# Patient Record
Sex: Male | Born: 1940 | Race: White | Hispanic: No | Smoking: Current every day smoker
Health system: Southern US, Community
[De-identification: ages and names within clinical notes are randomized; demographics above are authoritative.]

## PROBLEM LIST (undated history)

## (undated) DIAGNOSIS — T7840XA Allergy, unspecified, initial encounter: Secondary | ICD-10-CM

## (undated) DIAGNOSIS — F32A Depression, unspecified: Secondary | ICD-10-CM

## (undated) DIAGNOSIS — K219 Gastro-esophageal reflux disease without esophagitis: Secondary | ICD-10-CM

## (undated) DIAGNOSIS — E785 Hyperlipidemia, unspecified: Secondary | ICD-10-CM

## (undated) DIAGNOSIS — N529 Male erectile dysfunction, unspecified: Secondary | ICD-10-CM

## (undated) DIAGNOSIS — M545 Low back pain, unspecified: Secondary | ICD-10-CM

## (undated) DIAGNOSIS — E119 Type 2 diabetes mellitus without complications: Secondary | ICD-10-CM

## (undated) DIAGNOSIS — F329 Major depressive disorder, single episode, unspecified: Secondary | ICD-10-CM

## (undated) DIAGNOSIS — G4700 Insomnia, unspecified: Secondary | ICD-10-CM

## (undated) DIAGNOSIS — I10 Essential (primary) hypertension: Secondary | ICD-10-CM

## (undated) DIAGNOSIS — M1990 Unspecified osteoarthritis, unspecified site: Secondary | ICD-10-CM

## (undated) DIAGNOSIS — G8929 Other chronic pain: Secondary | ICD-10-CM

## (undated) DIAGNOSIS — F17200 Nicotine dependence, unspecified, uncomplicated: Secondary | ICD-10-CM

## (undated) DIAGNOSIS — J449 Chronic obstructive pulmonary disease, unspecified: Secondary | ICD-10-CM

## (undated) DIAGNOSIS — I739 Peripheral vascular disease, unspecified: Secondary | ICD-10-CM

## (undated) DIAGNOSIS — I701 Atherosclerosis of renal artery: Secondary | ICD-10-CM

## (undated) DIAGNOSIS — F419 Anxiety disorder, unspecified: Secondary | ICD-10-CM

## (undated) HISTORY — DX: Low back pain: M54.5

## (undated) HISTORY — DX: Anxiety disorder, unspecified: F41.9

## (undated) HISTORY — PX: COLONOSCOPY: SHX174

## (undated) HISTORY — DX: Essential (primary) hypertension: I10

## (undated) HISTORY — DX: Atherosclerosis of renal artery: I70.1

## (undated) HISTORY — DX: Peripheral vascular disease, unspecified: I73.9

## (undated) HISTORY — DX: Major depressive disorder, single episode, unspecified: F32.9

## (undated) HISTORY — DX: Low back pain, unspecified: M54.50

## (undated) HISTORY — DX: Depression, unspecified: F32.A

## (undated) HISTORY — DX: Insomnia, unspecified: G47.00

## (undated) HISTORY — DX: Other chronic pain: G89.29

## (undated) HISTORY — DX: Male erectile dysfunction, unspecified: N52.9

## (undated) HISTORY — DX: Allergy, unspecified, initial encounter: T78.40XA

## (undated) HISTORY — DX: Chronic obstructive pulmonary disease, unspecified: J44.9

## (undated) HISTORY — DX: Hyperlipidemia, unspecified: E78.5

## (undated) HISTORY — DX: Nicotine dependence, unspecified, uncomplicated: F17.200

---

## 2003-12-19 ENCOUNTER — Ambulatory Visit: Payer: Self-pay | Admitting: Family Medicine

## 2003-12-30 ENCOUNTER — Ambulatory Visit: Payer: Self-pay | Admitting: Family Medicine

## 2004-03-02 ENCOUNTER — Ambulatory Visit: Payer: Self-pay | Admitting: Family Medicine

## 2004-04-06 ENCOUNTER — Ambulatory Visit: Payer: Self-pay | Admitting: Family Medicine

## 2004-05-25 ENCOUNTER — Ambulatory Visit: Payer: Self-pay | Admitting: Family Medicine

## 2004-07-01 ENCOUNTER — Ambulatory Visit (HOSPITAL_COMMUNITY): Admission: RE | Admit: 2004-07-01 | Discharge: 2004-07-01 | Payer: Self-pay | Admitting: Family Medicine

## 2004-07-05 ENCOUNTER — Ambulatory Visit: Payer: Self-pay | Admitting: Family Medicine

## 2004-08-27 ENCOUNTER — Ambulatory Visit: Payer: Self-pay | Admitting: Family Medicine

## 2004-10-08 ENCOUNTER — Ambulatory Visit: Payer: Self-pay | Admitting: Family Medicine

## 2004-11-30 ENCOUNTER — Ambulatory Visit: Payer: Self-pay | Admitting: Family Medicine

## 2005-02-21 ENCOUNTER — Ambulatory Visit (HOSPITAL_COMMUNITY): Admission: RE | Admit: 2005-02-21 | Discharge: 2005-02-21 | Payer: Self-pay | Admitting: Family Medicine

## 2005-02-22 ENCOUNTER — Ambulatory Visit: Payer: Self-pay | Admitting: Family Medicine

## 2005-06-22 ENCOUNTER — Ambulatory Visit: Payer: Self-pay | Admitting: Family Medicine

## 2005-06-24 ENCOUNTER — Ambulatory Visit (HOSPITAL_COMMUNITY): Admission: RE | Admit: 2005-06-24 | Discharge: 2005-06-24 | Payer: Self-pay | Admitting: Family Medicine

## 2005-10-13 ENCOUNTER — Ambulatory Visit: Payer: Self-pay | Admitting: Family Medicine

## 2005-10-21 ENCOUNTER — Ambulatory Visit: Payer: Self-pay | Admitting: Family Medicine

## 2005-12-05 ENCOUNTER — Ambulatory Visit: Payer: Self-pay | Admitting: Family Medicine

## 2006-03-06 ENCOUNTER — Ambulatory Visit: Payer: Self-pay | Admitting: Family Medicine

## 2006-03-06 ENCOUNTER — Ambulatory Visit (HOSPITAL_COMMUNITY): Admission: RE | Admit: 2006-03-06 | Discharge: 2006-03-06 | Payer: Self-pay | Admitting: Family Medicine

## 2006-05-29 ENCOUNTER — Inpatient Hospital Stay (HOSPITAL_COMMUNITY): Admission: EM | Admit: 2006-05-29 | Discharge: 2006-05-31 | Payer: Self-pay | Admitting: Emergency Medicine

## 2006-05-30 ENCOUNTER — Ambulatory Visit: Payer: Self-pay | Admitting: Cardiology

## 2006-06-19 ENCOUNTER — Ambulatory Visit: Payer: Self-pay | Admitting: Family Medicine

## 2006-06-20 ENCOUNTER — Encounter: Payer: Self-pay | Admitting: Family Medicine

## 2006-07-26 ENCOUNTER — Ambulatory Visit: Payer: Self-pay | Admitting: Family Medicine

## 2006-10-04 ENCOUNTER — Ambulatory Visit: Payer: Self-pay | Admitting: Family Medicine

## 2006-10-05 ENCOUNTER — Encounter: Payer: Self-pay | Admitting: Family Medicine

## 2006-10-05 LAB — CONVERTED CEMR LAB
ALT: 17 units/L (ref 0–53)
BUN: 18 mg/dL (ref 6–23)
Bilirubin, Direct: 0.1 mg/dL (ref 0.0–0.3)
CO2: 22 meq/L (ref 19–32)
Chloride: 96 meq/L (ref 96–112)
Indirect Bilirubin: 0.3 mg/dL (ref 0.0–0.9)
Total Bilirubin: 0.4 mg/dL (ref 0.3–1.2)
Total CHOL/HDL Ratio: 2.6

## 2006-11-17 ENCOUNTER — Ambulatory Visit: Payer: Self-pay | Admitting: Family Medicine

## 2007-01-27 ENCOUNTER — Encounter (INDEPENDENT_AMBULATORY_CARE_PROVIDER_SITE_OTHER): Payer: Self-pay | Admitting: *Deleted

## 2007-03-07 ENCOUNTER — Encounter (INDEPENDENT_AMBULATORY_CARE_PROVIDER_SITE_OTHER): Payer: Self-pay | Admitting: *Deleted

## 2007-03-07 ENCOUNTER — Encounter: Payer: Self-pay | Admitting: Family Medicine

## 2007-03-07 LAB — CONVERTED CEMR LAB
Albumin: 4.4 g/dL (ref 3.5–5.2)
CO2: 22 meq/L (ref 19–32)
Chloride: 99 meq/L (ref 96–112)
Creatinine, Ser: 1.03 mg/dL (ref 0.40–1.50)
Glucose, Bld: 206 mg/dL — ABNORMAL HIGH (ref 70–99)
HDL: 41 mg/dL (ref >39)
Indirect Bilirubin: 0.3 mg/dL (ref 0.0–0.9)
Microalb, Ur: 2.59 mg/dL — ABNORMAL HIGH (ref 0.00–1.89)
PSA: 0.47 ng/mL
PSA: 0.47 ng/mL (ref 0.10–4.00)
PSA: NORMAL ng/mL
Sodium: 132 meq/L — ABNORMAL LOW (ref 135–145)
Total Bilirubin: 0.4 mg/dL (ref 0.3–1.2)
Total CHOL/HDL Ratio: 2.6
VLDL: 21 mg/dL (ref 0–40)

## 2007-03-12 ENCOUNTER — Ambulatory Visit: Payer: Self-pay | Admitting: Family Medicine

## 2007-03-12 LAB — CONVERTED CEMR LAB: Hgb A1c MFr Bld: 6.9 % — ABNORMAL HIGH (ref 4.6–6.1)

## 2007-06-12 ENCOUNTER — Ambulatory Visit: Payer: Self-pay | Admitting: Family Medicine

## 2007-06-20 ENCOUNTER — Encounter: Payer: Self-pay | Admitting: Family Medicine

## 2007-06-20 DIAGNOSIS — F528 Other sexual dysfunction not due to a substance or known physiological condition: Secondary | ICD-10-CM | POA: Insufficient documentation

## 2007-06-20 DIAGNOSIS — M545 Low back pain, unspecified: Secondary | ICD-10-CM | POA: Insufficient documentation

## 2007-06-20 DIAGNOSIS — I150 Renovascular hypertension: Secondary | ICD-10-CM | POA: Insufficient documentation

## 2007-06-20 DIAGNOSIS — F17200 Nicotine dependence, unspecified, uncomplicated: Secondary | ICD-10-CM | POA: Insufficient documentation

## 2007-06-20 DIAGNOSIS — E119 Type 2 diabetes mellitus without complications: Secondary | ICD-10-CM

## 2007-06-20 DIAGNOSIS — Z794 Long term (current) use of insulin: Secondary | ICD-10-CM

## 2007-06-20 DIAGNOSIS — E785 Hyperlipidemia, unspecified: Secondary | ICD-10-CM | POA: Insufficient documentation

## 2007-06-20 DIAGNOSIS — F411 Generalized anxiety disorder: Secondary | ICD-10-CM | POA: Insufficient documentation

## 2007-06-20 DIAGNOSIS — E1159 Type 2 diabetes mellitus with other circulatory complications: Secondary | ICD-10-CM | POA: Insufficient documentation

## 2007-06-20 HISTORY — DX: Other sexual dysfunction not due to a substance or known physiological condition: F52.8

## 2007-06-20 LAB — CONVERTED CEMR LAB
AST: 31 units/L (ref 0–37)
Alkaline Phosphatase: 53 units/L (ref 39–117)
CO2: 24 meq/L (ref 19–32)
Calcium: 9.3 mg/dL (ref 8.4–10.5)
Creatinine, Ser: 1.1 mg/dL (ref 0.40–1.50)
Glucose, Bld: 241 mg/dL — ABNORMAL HIGH (ref 70–99)
Sodium: 133 meq/L — ABNORMAL LOW (ref 135–145)
Total Bilirubin: 0.4 mg/dL (ref 0.3–1.2)
Total Protein: 6.8 g/dL (ref 6.0–8.3)

## 2007-09-25 ENCOUNTER — Ambulatory Visit: Payer: Self-pay | Admitting: Family Medicine

## 2007-10-03 ENCOUNTER — Encounter: Payer: Self-pay | Admitting: Family Medicine

## 2007-10-03 LAB — CONVERTED CEMR LAB
ALT: 21 units/L (ref 0–53)
AST: 37 units/L (ref 0–37)
Alkaline Phosphatase: 55 units/L (ref 39–117)
Bilirubin, Direct: 0.1 mg/dL (ref 0.0–0.3)
Cholesterol: 96 mg/dL (ref 0–200)
Creatinine, Ser: 1.21 mg/dL (ref 0.40–1.50)
Glucose, Bld: 99 mg/dL (ref 70–99)
Indirect Bilirubin: 0.2 mg/dL (ref 0.0–0.9)
LDL Cholesterol: 33 mg/dL (ref 0–99)
Total Bilirubin: 0.3 mg/dL (ref 0.3–1.2)
Total CHOL/HDL Ratio: 2.2
Triglycerides: 98 mg/dL (ref <150)
VLDL: 20 mg/dL (ref 0–40)

## 2007-10-08 ENCOUNTER — Encounter: Payer: Self-pay | Admitting: Family Medicine

## 2007-12-26 ENCOUNTER — Ambulatory Visit: Payer: Self-pay | Admitting: Family Medicine

## 2007-12-26 DIAGNOSIS — H906 Mixed conductive and sensorineural hearing loss, bilateral: Secondary | ICD-10-CM | POA: Insufficient documentation

## 2007-12-26 LAB — CONVERTED CEMR LAB: Hgb A1c MFr Bld: 7 %

## 2008-01-08 ENCOUNTER — Encounter: Payer: Self-pay | Admitting: Family Medicine

## 2008-02-12 ENCOUNTER — Encounter: Payer: Self-pay | Admitting: Family Medicine

## 2008-02-21 ENCOUNTER — Encounter: Payer: Self-pay | Admitting: Family Medicine

## 2008-03-20 ENCOUNTER — Encounter: Payer: Self-pay | Admitting: Family Medicine

## 2008-03-20 ENCOUNTER — Telehealth: Payer: Self-pay | Admitting: Family Medicine

## 2008-03-20 LAB — CONVERTED CEMR LAB
Albumin: 4 g/dL (ref 3.5–5.2)
CO2: 21 meq/L (ref 19–32)
Calcium: 9.2 mg/dL (ref 8.4–10.5)
HDL: 43 mg/dL (ref >39)
Total CHOL/HDL Ratio: 2.3
Total Protein: 6.7 g/dL (ref 6.0–8.3)
VLDL: 45 mg/dL — ABNORMAL HIGH (ref 0–40)

## 2008-03-24 ENCOUNTER — Ambulatory Visit: Payer: Self-pay | Admitting: Family Medicine

## 2008-03-24 DIAGNOSIS — R5383 Other fatigue: Secondary | ICD-10-CM | POA: Insufficient documentation

## 2008-03-24 DIAGNOSIS — R5381 Other malaise: Secondary | ICD-10-CM | POA: Insufficient documentation

## 2008-03-24 HISTORY — DX: Other malaise: R53.83

## 2008-03-24 HISTORY — DX: Other malaise: R53.81

## 2008-03-24 LAB — CONVERTED CEMR LAB: Glucose, Bld: 202 mg/dL

## 2008-03-25 ENCOUNTER — Encounter: Payer: Self-pay | Admitting: Family Medicine

## 2008-03-25 LAB — CONVERTED CEMR LAB
Microalb Creat Ratio: 32.1 mg/g — ABNORMAL HIGH (ref 0.0–30.0)
Microalb, Ur: 3.03 mg/dL — ABNORMAL HIGH (ref 0.00–1.89)

## 2008-04-10 ENCOUNTER — Telehealth: Payer: Self-pay | Admitting: Family Medicine

## 2008-04-17 ENCOUNTER — Telehealth: Payer: Self-pay | Admitting: Family Medicine

## 2008-07-04 ENCOUNTER — Telehealth: Payer: Self-pay | Admitting: Family Medicine

## 2008-07-10 ENCOUNTER — Telehealth: Payer: Self-pay | Admitting: Family Medicine

## 2008-07-15 ENCOUNTER — Ambulatory Visit: Payer: Self-pay | Admitting: Family Medicine

## 2008-07-15 LAB — CONVERTED CEMR LAB
Glucose, Bld: 253 mg/dL
Hgb A1c MFr Bld: 7.7 %

## 2008-07-21 DIAGNOSIS — G4700 Insomnia, unspecified: Secondary | ICD-10-CM | POA: Insufficient documentation

## 2008-07-22 ENCOUNTER — Encounter: Payer: Self-pay | Admitting: Family Medicine

## 2008-07-22 LAB — CONVERTED CEMR LAB
AST: 28 units/L (ref 0–37)
Basophils Relative: 0 % (ref 0–1)
CO2: 21 meq/L (ref 19–32)
Calcium: 8.9 mg/dL (ref 8.4–10.5)
Creatinine, Ser: 1.18 mg/dL (ref 0.40–1.50)
Glucose, Bld: 239 mg/dL — ABNORMAL HIGH (ref 70–99)
HCT: 34.1 % — ABNORMAL LOW (ref 39.0–52.0)
HDL: 38 mg/dL — ABNORMAL LOW (ref >39)
Hemoglobin: 11.7 g/dL — ABNORMAL LOW (ref 13.0–17.0)
LDL Cholesterol: 34 mg/dL (ref 0–99)
Lymphocytes Relative: 21 % (ref 12–46)
MCHC: 34.3 g/dL (ref 30.0–36.0)
Monocytes Absolute: 0.8 10*3/uL (ref 0.1–1.0)
Monocytes Relative: 11 % (ref 3–12)
Neutro Abs: 4.5 10*3/uL (ref 1.7–7.7)
Neutrophils Relative %: 63 % (ref 43–77)
RDW: 12.5 % (ref 11.5–15.5)
Sodium: 131 meq/L — ABNORMAL LOW (ref 135–145)
Total Protein: 6.4 g/dL (ref 6.0–8.3)
Triglycerides: 198 mg/dL — ABNORMAL HIGH (ref <150)
WBC: 7.2 10*3/uL (ref 4.0–10.5)

## 2008-08-13 ENCOUNTER — Telehealth: Payer: Self-pay | Admitting: Family Medicine

## 2008-08-13 ENCOUNTER — Encounter: Payer: Self-pay | Admitting: Family Medicine

## 2008-10-08 ENCOUNTER — Telehealth: Payer: Self-pay | Admitting: Family Medicine

## 2008-10-29 ENCOUNTER — Ambulatory Visit: Payer: Self-pay | Admitting: Family Medicine

## 2008-10-31 ENCOUNTER — Encounter: Payer: Self-pay | Admitting: Family Medicine

## 2008-11-19 ENCOUNTER — Telehealth: Payer: Self-pay | Admitting: Family Medicine

## 2008-11-21 ENCOUNTER — Telehealth: Payer: Self-pay | Admitting: Family Medicine

## 2008-12-03 ENCOUNTER — Encounter: Payer: Self-pay | Admitting: Family Medicine

## 2008-12-05 LAB — CONVERTED CEMR LAB
ALT: 22 units/L (ref 0–53)
AST: 27 units/L (ref 0–37)
Alkaline Phosphatase: 47 units/L (ref 39–117)
Bilirubin, Direct: 0.1 mg/dL (ref 0.0–0.3)
CO2: 23 meq/L (ref 19–32)
Calcium: 9.3 mg/dL (ref 8.4–10.5)
Cholesterol: 91 mg/dL (ref 0–200)
Glucose, Bld: 187 mg/dL — ABNORMAL HIGH (ref 70–99)
Indirect Bilirubin: 0.4 mg/dL (ref 0.0–0.9)
LDL Cholesterol: 31 mg/dL (ref 0–99)
Total Bilirubin: 0.5 mg/dL (ref 0.3–1.2)
Total Protein: 6.5 g/dL (ref 6.0–8.3)
Triglycerides: 110 mg/dL (ref <150)
VLDL: 22 mg/dL (ref 0–40)

## 2008-12-23 ENCOUNTER — Telehealth: Payer: Self-pay | Admitting: Family Medicine

## 2008-12-24 ENCOUNTER — Encounter: Payer: Self-pay | Admitting: Family Medicine

## 2009-01-02 ENCOUNTER — Encounter: Payer: Self-pay | Admitting: Family Medicine

## 2009-01-26 ENCOUNTER — Telehealth: Payer: Self-pay | Admitting: Family Medicine

## 2009-02-17 ENCOUNTER — Ambulatory Visit: Payer: Self-pay | Admitting: Family Medicine

## 2009-02-17 DIAGNOSIS — M6530 Trigger finger, unspecified finger: Secondary | ICD-10-CM | POA: Insufficient documentation

## 2009-02-17 LAB — CONVERTED CEMR LAB: Glucose, Bld: 190 mg/dL

## 2009-02-20 ENCOUNTER — Telehealth: Payer: Self-pay | Admitting: Family Medicine

## 2009-02-27 ENCOUNTER — Encounter: Payer: Self-pay | Admitting: Family Medicine

## 2009-03-16 ENCOUNTER — Ambulatory Visit: Payer: Self-pay | Admitting: Orthopedic Surgery

## 2009-04-17 ENCOUNTER — Encounter: Payer: Self-pay | Admitting: Family Medicine

## 2009-05-18 ENCOUNTER — Ambulatory Visit: Payer: Self-pay | Admitting: Family Medicine

## 2009-05-18 LAB — CONVERTED CEMR LAB
ALT: 18 units/L (ref 0–53)
Bilirubin, Direct: 0.1 mg/dL (ref 0.0–0.3)
Calcium: 9.2 mg/dL (ref 8.4–10.5)
Chloride: 99 meq/L (ref 96–112)
Creatinine, Urine: 57.8 mg/dL
PSA: 0.61 ng/mL (ref 0.10–4.00)
Potassium: 4.9 meq/L (ref 3.5–5.3)
Sodium: 130 meq/L — ABNORMAL LOW (ref 135–145)
Total Bilirubin: 0.4 mg/dL (ref 0.3–1.2)
Total CHOL/HDL Ratio: 3.8
Triglycerides: 112 mg/dL (ref <150)

## 2009-05-19 ENCOUNTER — Encounter: Payer: Self-pay | Admitting: Family Medicine

## 2009-05-19 LAB — CONVERTED CEMR LAB: Vit D, 25-Hydroxy: 36 ng/mL (ref 30–89)

## 2009-05-20 ENCOUNTER — Telehealth: Payer: Self-pay | Admitting: Family Medicine

## 2009-05-20 LAB — CONVERTED CEMR LAB: Hgb A1c MFr Bld: 8.5 % — ABNORMAL HIGH (ref 4.6–6.1)

## 2009-07-29 ENCOUNTER — Telehealth: Payer: Self-pay | Admitting: Family Medicine

## 2009-08-10 ENCOUNTER — Telehealth: Payer: Self-pay | Admitting: Family Medicine

## 2009-08-20 ENCOUNTER — Ambulatory Visit: Payer: Self-pay | Admitting: Family Medicine

## 2009-08-27 LAB — CONVERTED CEMR LAB
Basophils Relative: 0 % (ref 0–1)
Calcium: 9.6 mg/dL (ref 8.4–10.5)
Creatinine, Ser: 1.16 mg/dL (ref 0.40–1.50)
Hemoglobin: 11.8 g/dL — ABNORMAL LOW (ref 13.0–17.0)
Hgb A1c MFr Bld: 7.6 % — ABNORMAL HIGH (ref <5.7)
Lymphocytes Relative: 17 % (ref 12–46)
MCHC: 34.8 g/dL (ref 30.0–36.0)
MCV: 88.5 fL (ref 78.0–100.0)
Monocytes Relative: 11 % (ref 3–12)
Neutrophils Relative %: 69 % (ref 43–77)
Platelets: 178 10*3/uL (ref 150–400)
Sodium: 128 meq/L — ABNORMAL LOW (ref 135–145)
WBC: 7.3 10*3/uL (ref 4.0–10.5)

## 2009-09-03 ENCOUNTER — Telehealth: Payer: Self-pay | Admitting: Family Medicine

## 2009-09-15 ENCOUNTER — Telehealth: Payer: Self-pay | Admitting: Family Medicine

## 2009-09-28 ENCOUNTER — Telehealth (INDEPENDENT_AMBULATORY_CARE_PROVIDER_SITE_OTHER): Payer: Self-pay | Admitting: *Deleted

## 2009-09-29 ENCOUNTER — Ambulatory Visit: Payer: Self-pay | Admitting: Family Medicine

## 2009-09-29 LAB — CONVERTED CEMR LAB: Glucose, Bld: 232 mg/dL

## 2009-09-30 ENCOUNTER — Observation Stay (HOSPITAL_COMMUNITY): Admission: AD | Admit: 2009-09-30 | Discharge: 2009-10-01 | Payer: Self-pay | Admitting: *Deleted

## 2009-09-30 LAB — CONVERTED CEMR LAB
Chloride: 92 meq/L — ABNORMAL LOW (ref 96–112)
Creatinine, Ser: 1.3 mg/dL (ref 0.40–1.50)
Sodium: 123 meq/L — ABNORMAL LOW (ref 135–145)

## 2009-10-14 ENCOUNTER — Ambulatory Visit: Payer: Self-pay | Admitting: Family Medicine

## 2009-10-15 ENCOUNTER — Encounter: Payer: Self-pay | Admitting: Family Medicine

## 2009-10-15 LAB — CONVERTED CEMR LAB
BUN: 19 mg/dL (ref 6–23)
Calcium: 9.6 mg/dL (ref 8.4–10.5)
Creatinine, Ser: 1.07 mg/dL (ref 0.40–1.50)
Potassium: 5 meq/L (ref 3.5–5.3)

## 2009-10-28 ENCOUNTER — Encounter: Payer: Self-pay | Admitting: Family Medicine

## 2009-10-29 ENCOUNTER — Encounter: Payer: Self-pay | Admitting: Family Medicine

## 2009-11-24 ENCOUNTER — Ambulatory Visit: Payer: Self-pay | Admitting: Family Medicine

## 2009-11-24 DIAGNOSIS — H547 Unspecified visual loss: Secondary | ICD-10-CM

## 2009-11-24 HISTORY — DX: Unspecified visual loss: H54.7

## 2009-11-24 LAB — CONVERTED CEMR LAB
AST: 22 units/L (ref 0–37)
Bilirubin, Direct: 0.1 mg/dL (ref 0.0–0.3)
CO2: 24 meq/L (ref 19–32)
Calcium: 9.1 mg/dL (ref 8.4–10.5)
Chloride: 101 meq/L (ref 96–112)
Creatinine, Ser: 0.99 mg/dL (ref 0.40–1.50)
HDL: 36 mg/dL — ABNORMAL LOW (ref >39)
LDL Cholesterol: 90 mg/dL (ref 0–99)
Sodium: 133 meq/L — ABNORMAL LOW (ref 135–145)
Total Bilirubin: 0.4 mg/dL (ref 0.3–1.2)
Triglycerides: 69 mg/dL (ref <150)

## 2009-12-15 ENCOUNTER — Encounter: Payer: Self-pay | Admitting: Family Medicine

## 2009-12-24 ENCOUNTER — Encounter: Payer: Self-pay | Admitting: Family Medicine

## 2010-01-21 ENCOUNTER — Telehealth: Payer: Self-pay | Admitting: Family Medicine

## 2010-01-22 ENCOUNTER — Encounter: Payer: Self-pay | Admitting: Family Medicine

## 2010-01-25 ENCOUNTER — Encounter: Payer: Self-pay | Admitting: Family Medicine

## 2010-01-27 ENCOUNTER — Encounter: Payer: Self-pay | Admitting: Family Medicine

## 2010-02-02 ENCOUNTER — Telehealth: Payer: Self-pay | Admitting: Family Medicine

## 2010-02-05 ENCOUNTER — Encounter: Payer: Self-pay | Admitting: Family Medicine

## 2010-02-10 ENCOUNTER — Encounter: Payer: Self-pay | Admitting: Family Medicine

## 2010-02-24 ENCOUNTER — Telehealth: Payer: Self-pay | Admitting: Family Medicine

## 2010-02-24 ENCOUNTER — Encounter: Payer: Self-pay | Admitting: Family Medicine

## 2010-02-25 ENCOUNTER — Encounter: Payer: Self-pay | Admitting: Family Medicine

## 2010-02-25 ENCOUNTER — Telehealth: Payer: Self-pay | Admitting: Family Medicine

## 2010-02-28 LAB — CONVERTED CEMR LAB
Albumin: 4.2 g/dL (ref 3.5–5.2)
Alkaline Phosphatase: 39 units/L (ref 39–117)
BUN: 21 mg/dL (ref 6–23)
Chloride: 98 meq/L (ref 96–112)
Creatinine, Ser: 0.95 mg/dL (ref 0.40–1.50)
Glucose, Bld: 262 mg/dL — ABNORMAL HIGH (ref 70–99)
Hgb A1c MFr Bld: 8.1 % — ABNORMAL HIGH (ref <5.7)
LDL Cholesterol: 81 mg/dL (ref 0–99)
Sodium: 132 meq/L — ABNORMAL LOW (ref 135–145)
TSH: 1.312 microintl units/mL (ref 0.350–4.500)
Total Protein: 6.5 g/dL (ref 6.0–8.3)
Triglycerides: 116 mg/dL (ref <150)
VLDL: 23 mg/dL (ref 0–40)

## 2010-03-01 ENCOUNTER — Ambulatory Visit
Admission: RE | Admit: 2010-03-01 | Discharge: 2010-03-01 | Payer: Self-pay | Source: Home / Self Care | Attending: Family Medicine | Admitting: Family Medicine

## 2010-03-01 ENCOUNTER — Telehealth: Payer: Self-pay | Admitting: Family Medicine

## 2010-03-01 DIAGNOSIS — J209 Acute bronchitis, unspecified: Secondary | ICD-10-CM | POA: Insufficient documentation

## 2010-03-05 ENCOUNTER — Encounter: Payer: Self-pay | Admitting: Family Medicine

## 2010-03-07 ENCOUNTER — Encounter: Payer: Self-pay | Admitting: Family Medicine

## 2010-03-11 ENCOUNTER — Encounter: Payer: Self-pay | Admitting: Family Medicine

## 2010-03-16 NOTE — Letter (Signed)
Summary: RX ASSISTANCE  RX ASSISTANCE   Imported By: Lind Guest 10/15/2009 08:28:11  _____________________________________________________________________  External Attachment:    Type:   Image     Comment:   External Document

## 2010-03-16 NOTE — Assessment & Plan Note (Signed)
Summary: PER DR   Vital Signs:  Patient profile:   70 year old male Height:      68 inches Weight:      169.25 pounds BMI:     25.83 O2 Sat:      96 % Pulse rate:   74 / minute Pulse rhythm:   regular Resp:     16 per minute BP sitting:   140 / 67  (right arm)  Vitals Entered By: Everitt Amber LPN (October 14, 2009 1:28 PM)  Nutrition Counseling: Patient's BMI is greater than 25 and therefore counseled on weight management options. CC: Follow up chronic problems   CC:  Follow up chronic problems.  History of Present Illness: Pt i for f/uifrom recent hospitalisation for hyponatremi. He feels much better. He has more energy. He denies light headedness on his new BP meds, and has been checking it at home, averages around 140/80. His smoking is nunchanged, though he is trying to cut back , but finds this extremely difficult.  Current Medications (verified): 1)  Novolog Mix 70/30 70-30 %  Susp (Insulin Aspart Prot & Aspart) .... Inject 30 Units in Am and 5 Units in Pm 2)  Januvia 100 Mg  Tabs (Sitagliptin Phosphate) .... Take 1 Tablet By Mouth Once A Day 3)  Pravastatin Sodium 40 Mg Tabs (Pravastatin Sodium) .... 2 Tabs At Bedtime 4)  Losartan Potassium 100 Mg Tabs (Losartan Potassium) .... Take 1 Tablet By Mouth Once A Day 5)  Norvasc 10 Mg  Tabs (Amlodipine Besylate) .... Take 1 Tablet By Mouth Once A Day 6)  Alprazolam 0.5 Mg  Tabs (Alprazolam) .... Take One Tablet By Mouth At Bedtime  or As Directed 7)  Advair Diskus 100-50 Mcg/dose  Misc (Fluticasone-Salmeterol) .... Use As Directed 8)  Fluticasone Propionate 50 Mcg/act  Susp (Fluticasone Propionate) .Marland Kitchen.. 1 To 2 Puffs Per Nostril Twice Daily 9)  Bayer Contour Test  Strp (Glucose Blood) .... Four Times A Day Testing 10)  Flomax 0.4 Mg Xr24h-Cap (Tamsulosin Hcl) .... Take 1 Tablet By Mouth Once A Day 11)  Omeprazole 40 Mg Cpdr (Omeprazole) .... Take 1 Capsule By Mouth Once A Day 12)  Gabapentin 300 Mg Caps (Gabapentin) .... One  Capsule Twice Daily and Two Capsules At Bedtime 13)  Citalopram Hydrobromide 40 Mg Tabs (Citalopram Hydrobromide) .... Take 1 Tablet By Mouth Once A Day 14)  Zolpidem Tartrate 10 Mg Tabs (Zolpidem Tartrate) .... Take 1 Tab By Mouth At Bedtime 15)  Vicodin Es 7.5-750 Mg Tabs (Hydrocodone-Acetaminophen) .... Take 1 Tablet By Mouth Two Times A Day 16)  Metformin Hcl 1000 Mg Tabs (Metformin Hcl) .... Take 1 Tablet By Mouth Two Times A Day 17)  Advair Diskus 250-50 Mcg/dose Aepb (Fluticasone-Salmeterol) .... One Puff Two Times A Day  Allergies (verified): No Known Drug Allergies  Review of Systems      See HPI General:  Complains of fatigue. Eyes:  Denies discharge, eye pain, and red eye. ENT:  Denies hoarseness, nasal congestion, postnasal drainage, sinus pressure, and sore throat. CV:  Denies chest pain or discomfort, palpitations, and swelling of feet. Resp:  Complains of cough and shortness of breath; denies sputum productive and wheezing; chronic smoker's cough. GI:  Denies abdominal pain, constipation, diarrhea, nausea, and vomiting. GU:  Denies incontinence, urinary frequency, and urinary hesitancy. MS:  Complains of joint pain, low back pain, mid back pain, and stiffness. Derm:  Denies itching and rash. Neuro:  Complains of numbness; denies headaches, tingling, and tremors.  Endo:  Complains of excessive thirst and excessive urination; tests`3 times daily, never under 180. Heme:  Denies abnormal bruising and bleeding. Allergy:  Complains of seasonal allergies.  Physical Exam  General:  Well-developed,well-nourished,in no acute distress; alert,appropriate and cooperative throughout examination HEENT: No facial asymmetry,  EOMI, No sinus tenderness, TM's Clear, oropharynx  pink and moist.   Chest: Clear to auscultation bilaterally. decreased air entry bilaterally. CVS: S1, S2, No murmurs, No S3.   Abd: Soft, Nontender.  MS: Adequate ROM spine, hips, shoulders and knees.  Ext: No  edema.   CNS: CN 2-12 intact, power tone and sensation normal throughout.   Skin: Intact, no visible lesions or rashes.  Psych: Good eye contact, normal affect.  Memory intact, not anxious or depressed appearing.   Diabetes Management Exam:    Foot Exam (with socks and/or shoes not present):       Sensory-Monofilament:          Left foot: diminished          Right foot: diminished       Inspection:          Left foot: normal          Right foot: normal       Nails:          Left foot: normal          Right foot: normal   Impression & Recommendations:  Problem # 1:  HYPERLIPIDEMIA (ICD-272.4) Assessment Comment Only  His updated medication list for this problem includes:    Pravastatin Sodium 40 Mg Tabs (Pravastatin sodium) .Marland Kitchen... 2 tabs at bedtime  Labs Reviewed: SGOT: 26 (05/15/2009)   SGPT: 18 (05/15/2009)   HDL:39 (05/15/2009), 38 (12/05/2008)  LDL:86 (05/15/2009), 31 (12/05/2008)  Chol:147 (05/15/2009), 91 (12/05/2008)  Trig:112 (05/15/2009), 110 (12/05/2008)  Problem # 2:  NICOTINE ADDICTION (ICD-305.1) Assessment: Unchanged  Encouraged smoking cessation and discussed different methods for smoking cessation.   Problem # 3:  GENERALIZED ANXIETY DISORDER (ICD-300.02) Assessment: Unchanged  The following medications were removed from the medication list:    Diazepam 5 Mg Tabs (Diazepam) .Marland Kitchen... Take 1 tablet by mouth two times a day as needed His updated medication list for this problem includes:    Alprazolam 0.5 Mg Tabs (Alprazolam) .Marland Kitchen... Take one tablet by mouth at bedtime  or as directed    Citalopram Hydrobromide 40 Mg Tabs (Citalopram hydrobromide) .Marland Kitchen... Take 1 tablet by mouth once a day  Problem # 4:  HYPERTENSION (ICD-401.9) Assessment: Deteriorated  The following medications were removed from the medication list:    Enalapril Maleate 20 Mg Tabs (Enalapril maleate) .Marland Kitchen... Take 2 tabs once daily    Hydrochlorothiazide 25 Mg Tabs (Hydrochlorothiazide) .Marland Kitchen... Take  1 tablet by mouth once a day His updated medication list for this problem includes:    Losartan Potassium 100 Mg Tabs (Losartan potassium) .Marland Kitchen... Take 1 tablet by mouth once a day    Norvasc 10 Mg Tabs (Amlodipine besylate) .Marland Kitchen... Take 1 tablet by mouth once a day  Orders: T-Basic Metabolic Panel [phone])  BP today: 140/67 Prior BP: 130/60 (09/29/2009)  Labs Reviewed: K+: 4.9 (09/28/2009) Creat: : 1.30 (09/28/2009)   Chol: 147 (05/15/2009)   HDL: 39 (05/15/2009)   LDL: 86 (05/15/2009)   TG: 112 (05/15/2009)  Problem # 5:  IDDM (ICD-250.01) Assessment: Comment Only  The following medications were removed from the medication list:    Glipizide 10 Mg Tabs (Glipizide) .Marland Kitchen... Take 1 tablet by mouth two times  a day    Enalapril Maleate 20 Mg Tabs (Enalapril maleate) .Marland Kitchen... Take 2 tabs once daily His updated medication list for this problem includes:    Novolog Mix 70/30 70-30 % Susp (Insulin aspart prot & aspart) ..... Inject 30 units in am and 5 units in pm    Januvia 100 Mg Tabs (Sitagliptin phosphate) .Marland Kitchen... Take 1 tablet by mouth once a day    Losartan Potassium 100 Mg Tabs (Losartan potassium) .Marland Kitchen... Take 1 tablet by mouth once a day    Metformin Hcl 1000 Mg Tabs (Metformin hcl) .Marland Kitchen... Take 1 tablet by mouth two times a day  Labs Reviewed: Creat: 1.30 (09/28/2009)    Reviewed HgBA1c results: 7.6 (08/20/2009)  8.5 (05/18/2009)  Complete Medication List: 1)  Novolog Mix 70/30 70-30 % Susp (Insulin aspart prot & aspart) .... Inject 30 units in am and 5 units in pm 2)  Januvia 100 Mg Tabs (Sitagliptin phosphate) .... Take 1 tablet by mouth once a day 3)  Pravastatin Sodium 40 Mg Tabs (Pravastatin sodium) .... 2 tabs at bedtime 4)  Losartan Potassium 100 Mg Tabs (Losartan potassium) .... Take 1 tablet by mouth once a day 5)  Norvasc 10 Mg Tabs (Amlodipine besylate) .... Take 1 tablet by mouth once a day 6)  Alprazolam 0.5 Mg Tabs (Alprazolam) .... Take one tablet by mouth at bedtime  or  as directed 7)  Advair Diskus 100-50 Mcg/dose Misc (Fluticasone-salmeterol) .... Use as directed 8)  Fluticasone Propionate 50 Mcg/act Susp (Fluticasone propionate) .Marland Kitchen.. 1 to 2 puffs per nostril twice daily 9)  Bayer Contour Test Strp (Glucose blood) .... Four times a day testing 10)  Flomax 0.4 Mg Xr24h-cap (Tamsulosin hcl) .... Take 1 tablet by mouth once a day 11)  Omeprazole 40 Mg Cpdr (Omeprazole) .... Take 1 capsule by mouth once a day 12)  Gabapentin 300 Mg Caps (Gabapentin) .... One capsule twice daily and two capsules at bedtime 13)  Citalopram Hydrobromide 40 Mg Tabs (Citalopram hydrobromide) .... Take 1 tablet by mouth once a day 14)  Zolpidem Tartrate 10 Mg Tabs (Zolpidem tartrate) .... Take 1 tab by mouth at bedtime 15)  Vicodin Es 7.5-750 Mg Tabs (Hydrocodone-acetaminophen) .... Take 1 tablet by mouth two times a day 16)  Metformin Hcl 1000 Mg Tabs (Metformin hcl) .... Take 1 tablet by mouth two times a day 17)  Advair Diskus 250-50 Mcg/dose Aepb (Fluticasone-salmeterol) .... One puff two times a day  Patient Instructions: 1)  f/u as before. 2)  chem 7 today. 3)  pLs start taking insulin 15 units in the morning and 10 units at suppertime, call if any problems 4)  I am glad that you feel better Prescriptions: ADVAIR DISKUS 100-50 MCG/DOSE  MISC (FLUTICASONE-SALMETEROL) use as directed  #90days x 3   Entered by:   Everitt Amber LPN   Authorized by:   Syliva Overman MD   Signed by:   Everitt Amber LPN on 87/56/4332   Method used:   Printed then faxed to ...       [address] [phone]* (retail)       [address]       [place], [place]  [address]       Ph: [phone] or [phone]       Fax: [phone]   RxID:   [id number] NOVOLOG MIX 70/30 70-30 %  SUSP (INSULIN ASPART PROT & ASPART) Inject 30 units in am and 5 units in  pm  #90days x 3   Entered by:   Everitt Amber LPN   Authorized by:   Syliva Overman MD   Signed by:   Everitt Amber LPN on  18/84/1660   Method used:   Printed then faxed to ...       [address] [phone]* (retail)       [address]       [place], [place]  [address]       Ph: [phone] or [phone]       Fax: [phone]   RxID:   [id number] JANUVIA 100 MG  TABS (SITAGLIPTIN PHOSPHATE) Take 1 tablet by mouth once a day  #90 x 3   Entered by:   Everitt Amber LPN   Authorized by:   Syliva Overman MD   Signed by:   Everitt Amber LPN on 27/04/5007   Method used:   Printed then faxed to ...       [address] [phone]* (retail)       [address]       [place], [place]  [address]       Ph: [phone] or [phone]       Fax: [phone]   RxID:   [id number] OMEPRAZOLE 40 MG CPDR (OMEPRAZOLE) Take 1 capsule by mouth once a day  #30 x 3   Entered by:   Everitt Amber LPN   Authorized by:   Syliva Overman MD   Signed by:   Everitt Amber LPN on 50/93/2671   Method used:   Electronically to        [facility] [phone]* (retail)       [address]       [place], [place]  [address]       Ph: [phone] or [phone]       Fax: [phone]   RxID:   [id number]

## 2010-03-16 NOTE — Progress Notes (Signed)
Summary: medicine  Phone Note Call from Patient   Summary of Call: the metformin that he is on is not working. keeping sugar to high  [phone] Initial call taken by: Rudene Anda,  September 28, 2009 10:22 AM  Follow-up for Phone Call        no energy, fasting sugars at 300, no apetitie for 1 month pls do stop the metformin, states he stopped last Thursday, and has resumed insulin 30 units in the mornings Follow-up by: Syliva Overman MD,  September 28, 2009 12:19 PM  Additional Follow-up for Phone Call Additional follow up Details #1::        pls fax lab order for a chem 7 fioor pt, dx is dM and hTN, do nOT order stat he is going after 3;:30 Additional Follow-up by: Syliva Overman MD,  September 28, 2009 12:26 PM    Additional Follow-up for Phone Call Additional follow up Details #2::    pls call pt and give him an appt to see me tomorrow afternoon, uncontrolled DM Follow-up by: Syliva Overman MD,  September 28, 2009 12:27 PM  Additional Follow-up for Phone Call Additional follow up Details #3:: Details for Additional Follow-up Action Taken: appt. 8.16.11 @ 1:30 Additional Follow-up by: Lind Guest,  September 28, 2009 12:51 PM   Appended Document: medicine order sent

## 2010-03-16 NOTE — Progress Notes (Signed)
Summary: SAMPLE  Phone Note Call from Patient   Summary of Call: [facility] SAMPLES OF Christean Grief AT [phone] Initial call taken by: Lind Guest,  July 29, 2009 4:25 PM  Follow-up for Phone Call        patient aware Follow-up by: Adella Hare LPN,  July 29, 2009 4:45 PM  Additional Follow-up for Phone Call Additional follow up Details #1::        patient wants to know if you can change advair to somthing cheaper, in donut hole and cant afford med Additional Follow-up by: Adella Hare LPN,  July 29, 2009 4:46 PM    Additional Follow-up for Phone Call Additional follow up Details #2::    we can request symbicort 80/4.5 2 puffs twice daily from greg or his partener, if we do hav advair pls give them to him iof the dose is correct Follow-up by: Syliva Overman MD,  July 29, 2009 5:28 PM  Additional Follow-up for Phone Call Additional follow up Details #3:: Details for Additional Follow-up Action Taken: called greg for symbicort Additional Follow-up by: Adella Hare LPN,  August 04, 2009 9:38 AM

## 2010-03-16 NOTE — Letter (Signed)
Summary: History form  History form   Imported By: Jacklynn Ganong 03/18/2009 16:56:00  _____________________________________________________________________  External Attachment:    Type:   Image     Comment:   External Document

## 2010-03-16 NOTE — Assessment & Plan Note (Signed)
Summary: uncontrolled diabetes   Vital Signs:  Patient profile:   70 year old male Height:      68 inches Weight:      172 pounds BMI:     26.25 O2 Sat:      97 % Pulse rate:   82 / minute Pulse rhythm:   regular Resp:     16 per minute BP sitting:   130 / 60  Vitals Entered By: Everitt Amber LPN (September 29, 2009 1:34 PM)  Nutrition Counseling: Patient's BMI is greater than 25 and therefore counseled on weight management options. CC: Sugar has been uncontrolled since starting metformin   CC:  Sugar has been uncontrolled since starting metformin.  History of Present Illness: pt in today c/o uncontrolled blood sugars ever since  discontinuing the insulin and being on only metformin and glipizide. his sugars , even fasting have been consitenetlu over 200.  he states he fees weak and just does not feel well. He is a brittle diabetic and actually years ago was in a hyopglycemic coma prior to my assuming his care. he denies fever, chill, head or chest congestion. Apetitie is fair.l  Current Medications (verified): 1)  Glipizide 10 Mg  Tabs (Glipizide) .... Take 1 Tablet By Mouth Two Times A Day 2)  Novolog Mix 70/30 70-30 %  Susp (Insulin Aspart Prot & Aspart) .... Inject 30 Units in Am and 5 Units in Pm 3)  Januvia 100 Mg  Tabs (Sitagliptin Phosphate) .... Take 1 Tablet By Mouth Once A Day 4)  Simvastatin 20 Mg Tabs (Simvastatin) .... Take 1 Tablet By Mouth Once A Day 5)  Enalapril Maleate 20 Mg  Tabs (Enalapril Maleate) .... Take 2 Tabs Once Daily 6)  Losartan Potassium 100 Mg Tabs (Losartan Potassium) .... Take 1 Tablet By Mouth Once A Day 7)  Norvasc 10 Mg  Tabs (Amlodipine Besylate) .... Take 1 Tablet By Mouth Once A Day 8)  Hydrochlorothiazide 25 Mg  Tabs (Hydrochlorothiazide) .... Take 1 Tablet By Mouth Once A Day 9)  Klor-Con M20 20 Meq  Tbcr (Potassium Chloride Crys Cr) .... Take 1 Tablet By Mouth Once A Day 10)  Alprazolam 0.5 Mg  Tabs (Alprazolam) .... Take One Tablet By  Mouth At Bedtime  or As Directed 11)  Advair Diskus 100-50 Mcg/dose  Misc (Fluticasone-Salmeterol) .... Use As Directed 12)  Fluticasone Propionate 50 Mcg/act  Susp (Fluticasone Propionate) .Marland Kitchen.. 1 To 2 Puffs Per Nostril Twice Daily 13)  Bayer Contour Test  Strp (Glucose Blood) .... Four Times A Day Testing 14)  Diazepam 5 Mg Tabs (Diazepam) .... Take 1 Tablet By Mouth Two Times A Day As Needed 15)  Flomax 0.4 Mg Xr24h-Cap (Tamsulosin Hcl) .... Take 1 Tablet By Mouth Once A Day 16)  Omeprazole 40 Mg Cpdr (Omeprazole) .... Take 1 Capsule By Mouth Once A Day 17)  Gabapentin 300 Mg Caps (Gabapentin) .... One Capsule Twice Daily and Two Capsules At Bedtime 18)  Citalopram Hydrobromide 40 Mg Tabs (Citalopram Hydrobromide) .... Take 1 Tablet By Mouth Once A Day 19)  Zolpidem Tartrate 10 Mg Tabs (Zolpidem Tartrate) .... Take 1 Tab By Mouth At Bedtime 20)  Vicodin Es 7.5-750 Mg Tabs (Hydrocodone-Acetaminophen) .... Take 1 Tablet By Mouth Two Times A Day 21)  Metformin Hcl 1000 Mg Tabs (Metformin Hcl) .... Take 1 Tablet By Mouth Two Times A Day 22)  Advair Diskus 250-50 Mcg/dose Aepb (Fluticasone-Salmeterol) .... One Puff Two Times A Day  Allergies (verified): No Known Drug  Allergies  Review of Systems      See HPI General:  Complains of fatigue and malaise; denies chills and fever. Eyes:  Denies blurring and discharge. ENT:  Denies hoarseness and nasal congestion. CV:  Complains of fatigue; denies chest pain or discomfort, palpitations, and swelling of feet. Resp:  Complains of cough; denies shortness of breath, sputum productive, and wheezing; continues to smoke at least 1PPD. GI:  Denies abdominal pain. GU:  Complains of erectile dysfunction; denies incontinence, urinary frequency, and urinary hesitancy. MS:  Complains of low back pain and mid back pain. Derm:  Denies itching and rash. Neuro:  Denies headaches, poor balance, seizures, tingling, and tremors. Psych:  Complains of anxiety and  depression; controlled on meds. Endo:  Complains of excessive thirst and excessive urination. Heme:  Denies abnormal bruising and bleeding. Allergy:  Denies hives or rash and itching eyes.  Physical Exam  General:  Well-developed,well-nourished,in no acute distress; alert,appropriate and cooperative throughout examination. Appears fatigued. HEENT: No facial asymmetry,  EOMI, No sinus tenderness, TM's Clear, oropharynx  pink and moist.   Chest: Clear to auscultation bilaterally. decreased air entry bilaterally CVS: S1, S2, No murmurs, No S3.   Abd: Soft, Nontender.  MS: decreased  ROM spine,adequate in hips, shoulders and knees.  Ext: No edema.   CNS: CN 2-12 intact, power tone and sensation normal throughout.   Skin: Intact, no visible lesions or rashes.  Psych: Good eye contact, normal affect.  Memory intact, not anxious or depressed appearing.   Diabetes Management Exam:    Foot Exam (with socks and/or shoes not present):       Sensory-Monofilament:          Left foot: diminished          Right foot: diminished       Inspection:          Left foot: normal          Right foot: normal       Nails:          Left foot: normal          Right foot: normal   Impression & Recommendations:  Problem # 1:  FATIGUE (ICD-780.79) Assessment Deteriorated  Orders: T- Hemoglobin A1C ([address]-60[address])  Problem # 2:  LOW BACK PAIN, CHRONIC (ICD-724.2) Assessment: Unchanged  His updated medication list for this problem includes:    Vicodin Es 7.5-750 Mg Tabs (Hydrocodone-acetaminophen) .Marland Kitchen... Take 1 tablet by mouth two times a day  Problem # 3:  HYPERTENSION (ICD-401.9) Assessment: Unchanged  His updated medication list for this problem includes:    Enalapril Maleate 20 Mg Tabs (Enalapril maleate) .Marland Kitchen... Take 2 tabs once daily    Losartan Potassium 100 Mg Tabs (Losartan potassium) .Marland Kitchen... Take 1 tablet by mouth once a day    Norvasc 10 Mg Tabs (Amlodipine besylate) .Marland Kitchen... Take 1 tablet by  mouth once a day    Hydrochlorothiazide 25 Mg Tabs (Hydrochlorothiazide) .Marland Kitchen... Take 1 tablet by mouth once a day  Orders: T-Basic Metabolic Panel [phone])  BP today: 130/60 Prior BP: 110/50 (08/20/2009)  Labs Reviewed: K+: 5.3 (08/20/2009) Creat: : 1.16 (08/20/2009)   Chol: 147 (05/15/2009)   HDL: 39 (05/15/2009)   LDL: 86 (05/15/2009)   TG: 112 (05/15/2009)  Problem # 4:  IDDM (ICD-250.01) Assessment: Improved  His updated medication list for this problem includes:    Glipizide 10 Mg Tabs (Glipizide) .Marland Kitchen... Take 1 tablet by mouth two times a day    Novolog  Mix 70/30 70-30 % Susp (Insulin aspart prot & aspart) ..... Inject 30 units in am and 5 units in pm    Januvia 100 Mg Tabs (Sitagliptin phosphate) .Marland Kitchen... Take 1 tablet by mouth once a day    Enalapril Maleate 20 Mg Tabs (Enalapril maleate) .Marland Kitchen... Take 2 tabs once daily    Losartan Potassium 100 Mg Tabs (Losartan potassium) .Marland Kitchen... Take 1 tablet by mouth once a day    Metformin Hcl 1000 Mg Tabs (Metformin hcl) .Marland Kitchen... Take 1 tablet by mouth two times a day  Orders: Glucose, (CBG) (16109)  Labs Reviewed: Creat: 1.16 (08/20/2009)    Reviewed HgBA1c results: 7.6 (08/20/2009), due top poor control, pt to resuime insulin at lower dose increasing gradually  8.5 (05/18/2009)  Problem # 5:  NICOTINE ADDICTION (ICD-305.1) Assessment: Unchanged  Encouraged smoking cessation and discussed different methods for smoking cessation.   Problem # 6:  HYPERLIPIDEMIA (ICD-272.4) Assessment: Comment Only  His updated medication list for this problem includes:    Pravastatin Sodium 40 Mg Tabs (Pravastatin sodium) .Marland Kitchen... 2 tabs at bedtime  Orders: T-Hepatic Function [id number] [phone]) T-Lipid Profile [id number])  Labs Reviewed: SGOT: 26 (05/15/2009)   SGPT: 18 (05/15/2009)   HDL:39 (05/15/2009), 38 (12/05/2008)  LDL:86 (05/15/2009), 31 (12/05/2008)  Chol:147 (05/15/2009), 91 (12/05/2008)  Trig:112 (05/15/2009), 110 (12/05/2008)  Complete  Medication List: 1)  Glipizide 10 Mg Tabs (Glipizide) .... Take 1 tablet by mouth two times a day 2)  Novolog Mix 70/30 70-30 % Susp (Insulin aspart prot & aspart) .... Inject 30 units in am and 5 units in pm 3)  Januvia 100 Mg Tabs (Sitagliptin phosphate) .... Take 1 tablet by mouth once a day 4)  Pravastatin Sodium 40 Mg Tabs (Pravastatin sodium) .... 2 tabs at bedtime 5)  Enalapril Maleate 20 Mg Tabs (Enalapril maleate) .... Take 2 tabs once daily 6)  Losartan Potassium 100 Mg Tabs (Losartan potassium) .... Take 1 tablet by mouth once a day 7)  Norvasc 10 Mg Tabs (Amlodipine besylate) .... Take 1 tablet by mouth once a day 8)  Hydrochlorothiazide 25 Mg Tabs (Hydrochlorothiazide) .... Take 1 tablet by mouth once a day 9)  Klor-con M20 20 Meq Tbcr (Potassium chloride crys cr) .... Take 1 tablet by mouth once a day 10)  Alprazolam 0.5 Mg Tabs (Alprazolam) .... Take one tablet by mouth at bedtime  or as directed 11)  Advair Diskus 100-50 Mcg/dose Misc (Fluticasone-salmeterol) .... Use as directed 12)  Fluticasone Propionate 50 Mcg/act Susp (Fluticasone propionate) .Marland Kitchen.. 1 to 2 puffs per nostril twice daily 13)  Bayer Contour Test Strp (Glucose blood) .... Four times a day testing 14)  Diazepam 5 Mg Tabs (Diazepam) .... Take 1 tablet by mouth two times a day as needed 15)  Flomax 0.4 Mg Xr24h-cap (Tamsulosin hcl) .... Take 1 tablet by mouth once a day 16)  Omeprazole 40 Mg Cpdr (Omeprazole) .... Take 1 capsule by mouth once a day 17)  Gabapentin 300 Mg Caps (Gabapentin) .... One capsule twice daily and two capsules at bedtime 18)  Citalopram Hydrobromide 40 Mg Tabs (Citalopram hydrobromide) .... Take 1 tablet by mouth once a day 19)  Zolpidem Tartrate 10 Mg Tabs (Zolpidem tartrate) .... Take 1 tab by mouth at bedtime 20)  Vicodin Es 7.5-750 Mg Tabs (Hydrocodone-acetaminophen) .... Take 1 tablet by mouth two times a day 21)  Metformin Hcl 1000 Mg Tabs (Metformin hcl) .... Take 1 tablet by mouth two  times a day 22)  Advair Diskus 250-50 Mcg/dose Aepb (  Fluticasone-salmeterol) .... One puff two times a day  Patient Instructions: 1)  F/u mid October earlier if needed 2)  Pls stop the simvastatin, new med pravastatin for your cholesterol. 3)  PLS start novolog70/30 7 units in the morning and 3 units in the evening 4)  If blood sugars are still high after 1 week inc to 5 units in the evening, ,  5)  If blood sugars are still high after 2 weeks increase to 7 uniits twice daily, call if you have probs for an appt to come in. Prescriptions: GABAPENTIN 300 MG CAPS (GABAPENTIN) one capsule twice daily and two capsules at bedtime  #360 x 3   Entered by:   Everitt Amber LPN   Authorized by:   Syliva Overman MD   Signed by:   Everitt Amber LPN on 16/11/9602   Method used:   Historical   RxID:   [id number] CITALOPRAM HYDROBROMIDE 40 MG TABS (CITALOPRAM HYDROBROMIDE) Take 1 tablet by mouth once a day  #90 x 3   Entered by:   Everitt Amber LPN   Authorized by:   Syliva Overman MD   Signed by:   Everitt Amber LPN on 21/30/8657   Method used:   Historical   RxID:   [id number] KLOR-CON M20 20 MEQ  TBCR (POTASSIUM CHLORIDE CRYS CR) Take 1 tablet by mouth once a day  #90 x 3   Entered by:   Everitt Amber LPN   Authorized by:   Syliva Overman MD   Signed by:   Everitt Amber LPN on 02/16/7251   Method used:   Historical   RxID:   [id number] HYDROCHLOROTHIAZIDE 25 MG  TABS (HYDROCHLOROTHIAZIDE) Take 1 tablet by mouth once a day  #90 x 3   Entered by:   Everitt Amber LPN   Authorized by:   Syliva Overman MD   Signed by:   Everitt Amber LPN on 75/64/3329   Method used:   Historical   RxID:   [id number] PRAVASTATIN SODIUM 40 MG TABS (PRAVASTATIN SODIUM) 2 tabs at bedtime  #180 x 3   Entered by:   Everitt Amber LPN   Authorized by:   Syliva Overman MD   Signed by:   Everitt Amber LPN on 09/32/3557   Method used:   Historical   RxID:   [id number]    Orders Added: 1)  Est.  Patient Level IV [83151] 2)  T-Basic Metabolic Panel [[id number]] 3)  T-Hepatic Function [62694-85462] 4)  T-Lipid Profile [80061-22930] 5)  T- Hemoglobin A1C [83036-23375] 6)  Glucose, (CBG) [82962]   Laboratory Results   Blood Tests     Glucose (random): 232 mg/dL   (Normal Range: 70-350)

## 2010-03-16 NOTE — Assessment & Plan Note (Signed)
Summary: F UP   Vital Signs:  Patient profile:   70 year old male Height:      68 inches Weight:      169 pounds BMI:     25.79 O2 Sat:      93 % on Room air Pulse rate:   75 / minute Pulse rhythm:   regular Resp:     16 per minute BP sitting:   138 / 66  (left arm)  Vitals Entered By: Mauricia Area, CMA  Nutrition Counseling: Patient's BMI is greater than 25 and therefore counseled on weight management options.  O2 Flow:  Room air CC: follow up. chronic leg pain, Depression   CC:  follow up. chronic leg pain and Depression.  History of Present Illness: Reports  that he has been doing fairly well. Denies recent fever or chills. Denies sinus pressure, nasal congestion , ear pain or sore throat. Denies chest congestion, or cough productive of sputum. Denies chest pain, palpitations, PND, orthopnea or leg swelling. Denies abdominal pain, nausea, vomitting, diarrhea or constipation. Denies change in bowel movements or bloody stool. Denies dysuria , frequency, incontinence or hesitancy. Reports uncontrolled leg pain and wants adose increase in pain meds Denies headaches, vertigo, seizures. Denies depression, anxiety or insomnia. Denies  rash, lesions, or itch. Pt is here to review recent labs as well as his chronic probs. No commitment to quitting nicotine use     Preventive Screening-Counseling & Management  Alcohol-Tobacco     Smoking Cessation Counseling: yes  Allergies (verified): No Known Drug Allergies  Review of Systems      See HPI General:  Complains of fatigue and sleep disorder; takes med for this chronically, reports no "get up and go" poor sleep pain an issue. Eyes:  Complains of vision loss-both eyes; reports "floaters " and reduced vision, requests referral for this. GU:  Complains of erectile dysfunction. MS:  Complains of low back pain, mid back pain, and muscle weakness; worsening pain symptoms. Endo:  Denies excessive thirst and excessive  urination; tests on avg 3 times daily and has fluctuation in his blood sugars. Heme:  Denies abnormal bruising and bleeding. Allergy:  Denies hives or rash and itching eyes.  Physical Exam  General:  Well-developed,well-nourished,in no acute distress; alert,appropriate and cooperative throughout examination HEENT: No facial asymmetry,  EOMI, No sinus tenderness, TM's Clear, oropharynx  pink and moist.   Chest: Clear to auscultation bilaterally. decreased air entry CVS: S1, S2, No murmurs, No S3.   Abd: Soft, Nontender.  EA:[id number]  ROM spine,adequate in  hips, shoulders and knees.  Ext: No edema.   CNS: CN 2-12 intact, power tone and sensation decreased in lower ext  Skin: Intact, no visible lesions or rashes.  Psych: Good eye contact, normal affect.  Memory intact, not anxious or depressed appearing.   Diabetes Management Exam:    Foot Exam (with socks and/or shoes not present):       Sensory-Monofilament:          Left foot: diminished          Right foot: diminished       Inspection:          Left foot: normal          Right foot: normal       Nails:          Left foot: normal          Right foot: normal   Impression & Recommendations:  Problem #  1:  UNSPECIFIED VISUAL LOSS (ICD-369.9) Assessment Deteriorated  Orders: Ophthalmology Referral (Ophthalmology)  Problem # 2:  INSOMNIA (ICD-780.52) Assessment: Deteriorated  His updated medication list for this problem includes:    Zolpidem Tartrate 10 Mg Tabs (Zolpidem tartrate) .Marland Kitchen... Take 1 tab by mouth at bedtime  Discussed sleep hygiene.   Problem # 3:  HYPERLIPIDEMIA (ICD-272.4) Assessment: Comment Only  The following medications were removed from the medication list:    Pravastatin Sodium 40 Mg Tabs (Pravastatin sodium) .Marland Kitchen... 2 tabs at bedtime His updated medication list for this problem includes:    Pravastatin Sodium 40 Mg Tabs (Pravastatin sodium) .Marland Kitchen... Take 1 tab by mouth at bedtime Low fat dietdiscussed  and encouraged  Orders: T-Hepatic Function [phone]) T-Lipid Profile [phone])  Labs Reviewed: SGOT: 22 (11/23/2009)   SGPT: 15 (11/23/2009)   HDL:36 (11/23/2009), 39 (05/15/2009)  LDL:90 (11/23/2009), 86 (05/15/2009)  Chol:140 (11/23/2009), 147 (05/15/2009)  Trig:69 (11/23/2009), 112 (05/15/2009)  Problem # 4:  GENERALIZED ANXIETY DISORDER (ICD-300.02) Assessment: Unchanged  His updated medication list for this problem includes:    Alprazolam 0.5 Mg Tabs (Alprazolam) .Marland Kitchen... Take one tablet by mouth at bedtime  or as directed    Citalopram Hydrobromide 40 Mg Tabs (Citalopram hydrobromide) .Marland Kitchen... Take 1 tablet by mouth once a day  Problem # 5:  NICOTINE ADDICTION (ICD-305.1) Assessment: Unchanged  Encouraged smoking cessation and discussed different methods for smoking cessation.   Problem # 6:  IDDM (ICD-250.01) Assessment: Deteriorated  The following medications were removed from the medication list:    Novolog Mix 70/30 70-30 % Susp (Insulin aspart prot & aspart) ..... Inject 30 units in am and 5 units in pm His updated medication list for this problem includes:    Januvia 100 Mg Tabs (Sitagliptin phosphate) .Marland Kitchen... Take 1 tablet by mouth once a day    Losartan Potassium 100 Mg Tabs (Losartan potassium) .Marland Kitchen... Take 1 tablet by mouth once a day    Metformin Hcl 1000 Mg Tabs (Metformin hcl) .Marland Kitchen... Take 1 tablet by mouth two times a day    Novolog Mix 70/30 70-30 % Susp (Insulin aspart prot & aspart) .Marland Kitchen... 25 units twice daily  Orders: T- Hemoglobin A1C ([id number]) Ophthalmology Referral (Ophthalmology)  Labs Reviewed: Creat: 0.99 (11/23/2009)    Reviewed HgBA1c results: 8.1 (11/23/2009)  7.6 (08/20/2009)  Complete Medication List: 1)  Januvia 100 Mg Tabs (Sitagliptin phosphate) .... Take 1 tablet by mouth once a day 2)  Losartan Potassium 100 Mg Tabs (Losartan potassium) .... Take 1 tablet by mouth once a day 3)  Norvasc 10 Mg Tabs (Amlodipine besylate) .... Take 1  tablet by mouth once a day 4)  Alprazolam 0.5 Mg Tabs (Alprazolam) .... Take one tablet by mouth at bedtime  or as directed 5)  Advair Diskus 100-50 Mcg/dose Misc (Fluticasone-salmeterol) .... Use as directed 6)  Fluticasone Propionate 50 Mcg/act Susp (Fluticasone propionate) .Marland Kitchen.. 1 to 2 puffs per nostril twice daily 7)  Bayer Contour Test Strp (Glucose blood) .... Four times a day testing 8)  Flomax 0.4 Mg Xr24h-cap (Tamsulosin hcl) .... Take 1 tablet by mouth once a day 9)  Gabapentin 300 Mg Caps (Gabapentin) .... One capsule twice daily and two capsules at bedtime 10)  Citalopram Hydrobromide 40 Mg Tabs (Citalopram hydrobromide) .... Take 1 tablet by mouth once a day 11)  Zolpidem Tartrate 10 Mg Tabs (Zolpidem tartrate) .... Take 1 tab by mouth at bedtime 12)  Vicodin Es 7.5-750 Mg Tabs (Hydrocodone-acetaminophen) .... Take 1 tablet by mouth two  times a day 13)  Metformin Hcl 1000 Mg Tabs (Metformin hcl) .... Take 1 tablet by mouth two times a day 14)  Advair Diskus 250-50 Mcg/dose Aepb (Fluticasone-salmeterol) .... One puff two times a day 15)  Pravastatin Sodium 40 Mg Tabs (Pravastatin sodium) .... Take 1 tab by mouth at bedtime 16)  Omeprazole 40 Mg Cpdr (Omeprazole) .... Take 1 capsule by mouth once a day as needed fo stomach pain 17)  Novolog Mix 70/30 70-30 % Susp (Insulin aspart prot & aspart) .... 25 units twice daily  Other Orders: T-Basic Metabolic Panel [phone]) T-TSH [id number] [phone])   Patient Instructions: 1)  Please schedule a follow-up appointment in 3 months. 2)  increased insulin first to 20 uniots in the morning and keep 15 at night, after 2 weeks if still high go to 17 at night, then to 20 units twice daily in about 4 weeks if needed. Call if sugars are still high after this pls. 3)  You will be referred to dr Luciana Axe for eye exam. 4)  New dose pravastatin is one at night 5)  BMP prior to visit, ICD-9: 6)  Hepatic Panel prior to visit, ICD-9:  fasting in 3 months 7)   Lipid Panel prior to visit, ICD-9: 8)  HbgA1C prior to visit, ICD-9: 9)  Tobacco is very bad for your health and your loved ones! You Should stop smoking!. 10)  Stop Smoking Tips: Choose a Quit date. Cut down before the Quit date. decide what you will do as a substitute when you feel the urge to smoke(gum,toothpick,exercise). 11)  It is important that you exercise regularly at least 20 minutes 5 times a week. If you develop chest pain, have severe difficulty breathing, or feel very tired , stop exercising immediately and seek medical attention. 12)  Check your blood sugars regularly. If your readings are usually above 250 or below 70 you should contact our office 13)  The medication list was reviewed and reconciled..All changed/newly prescribed medications were explained. A complete medication list was provided to the patient/caregiver.  Prescriptions: FLOMAX 0.4 MG XR24H-CAP (TAMSULOSIN HCL) Take 1 tablet by mouth once a day  #30 Capsule x 3   Entered by:   Adella Hare LPN   Authorized by:   Syliva Overman MD   Signed by:   Adella Hare LPN on 40/11/2723   Method used:   Printed then faxed to ...       [address] [phone]* (retail)       [address]       [place], [place]  [address]       Ph: [phone] or [phone]       Fax: [phone]   RxID:   [id number] ALPRAZOLAM 0.5 MG  TABS (ALPRAZOLAM) take one tablet by mouth at bedtime  or as directed  #30 x 3   Entered by:   Adella Hare LPN   Authorized by:   Syliva Overman MD   Signed by:   Adella Hare LPN on 42/70/6237   Method used:   Printed then faxed to ...       [address] [phone]* (retail)       [address]       [place], [place]  [address]       Ph: [phone] or [phone]       Fax: [phone]   RxID:   [id number] NOVOLOG MIX 70/30  70-30 % SUSP (INSULIN ASPART PROT & ASPART) 25 units twice daily  #3 mnth supp x 3   Entered by:   Adella Hare LPN   Authorized  by:   Syliva Overman MD   Signed by:   Adella Hare LPN on 16/11/9602   Method used:   Printed then faxed to ...       [address] [phone]* (retail)       [address]       [place], [place]  [address]       Ph: [phone] or [phone]       Fax: [phone]   RxID:   [id number] OMEPRAZOLE 40 MG CPDR (OMEPRAZOLE) Take 1 capsule by mouth once a day as needed fo stomach pain  #30 x 4   Entered and Authorized by:   Syliva Overman MD   Signed by:   Adella Hare LPN on 74/25/9563   Method used:   Historical   RxID:   [id number] PRAVASTATIN SODIUM 40 MG TABS (PRAVASTATIN SODIUM) Take 1 tab by mouth at bedtime  #90 x 1   Entered and Authorized by:   Syliva Overman MD   Signed by:   Adella Hare LPN on 60/63/0160   Method used:   Historical   RxID:   [id number]

## 2010-03-16 NOTE — Progress Notes (Signed)
Summary: NEEDS SYRINGES  Phone Note Call from Patient   Summary of Call: NEEDS FOR YOU TO SEND IN SYRINGES  U 100 50 UNITS 30 GAUGE  SHORT   [facility] IN [place] Initial call taken by: Lind Guest,  February 20, 2009 11:23 AM  Follow-up for Phone Call        the msg is a bit confused re specificatio on supplies needed, once you clarify, pls send in 1 month supply with 5 refills, he is an insulin dependent diabetic Follow-up by: Syliva Overman MD,  February 23, 2009 12:26 PM  Additional Follow-up for Phone Call Additional follow up Details #1::        refill called in Additional Follow-up by: Worthy Keeler LPN,  February 23, 2009 2:52 PM

## 2010-03-16 NOTE — Progress Notes (Signed)
Summary: ADVAIR  Phone Note Call from Patient   Summary of Call: WANTS TO KNOW DO YOU HAVE SOME ADVAIR CALL BACK AND LET HIM KNOW Initial call taken by: Lind Guest,  September 03, 2009 10:08 AM  Follow-up for Phone Call        patient aware Follow-up by: Adella Hare LPN,  September 04, 2009 3:52 PM

## 2010-03-16 NOTE — Letter (Signed)
Summary: chart review  chart review   Imported By: Lind Guest 01/22/2010 13:56:21  _____________________________________________________________________  External Attachment:    Type:   Image     Comment:   External Document

## 2010-03-16 NOTE — Progress Notes (Signed)
  Phone Note Call from Patient   Caller: Patient Summary of Call: patient is asking what medication that dr Lavon Horn recently took him off of. i do not see this in ov note Initial call taken by: Adella Hare LPN,  May 20, 2009 11:37 AM  Follow-up for Phone Call        fluoxetine was discontinued and the citalopram inc to 40mg  , both are in the same class for depression, that is why Follow-up by: Syliva Overman MD,  May 20, 2009 1:28 PM  Additional Follow-up for Phone Call Additional follow up Details #1::        Patient aware Additional Follow-up by: Everitt Amber LPN,  May 20, 2009 3:43 PM

## 2010-03-16 NOTE — Assessment & Plan Note (Signed)
Summary: left trigger thumb/sec hor/simpson/bsf   Visit Type:  Initial Consult Referring Provider:  Dr.  Berenice Primas  CC:  left thumb trigger finger.  History of Present Illness: I saw Donald Everett in the office today for an initial visit.  He is a 70 years old man with the complaint of:  Left thumb trigger finger.  Patient states his thumb has been bothering him since last year.   Allergies: No Known Drug Allergies  Review of Systems General:  Complains of chills; denies weight loss, weight gain, fever, and fatigue. Cardiac :  Denies chest pain, angina, heart attack, heart failure, poor circulation, blood clots, and phlebitis. Resp:  Denies short of breath, difficulty breathing, COPD, cough, and pneumonia; snoring. GI:  Complains of reflux; denies nausea, vomiting, diarrhea, constipation, difficulty swallowing, ulcers, and GERD. GU:  Denies kidney failure, kidney transplant, kidney stones, burning, poor stream, testicular cancer, blood in urine, and ; frequency. Neuro:  Complains of headache; denies dizziness, migraines, numbness, weakness, tremor, and unsteady walking; tingling. MS:  Complains of joint pain; denies rheumatoid arthritis, joint swelling, gout, bone cancer, osteoporosis, and . Endo:  Denies thyroid disease, goiter, and diabetes. Psych:  Complains of depression; denies mood swings, anxiety, panic attack, bipolar, and schizophrenia. Derm:  Denies eczema, cancer, and itching. EENT:  Complains of ears ringing; denies poor vision, cataracts, glaucoma, poor hearing, vertigo, sinusitis, hoarseness, toothaches, and bleeding gums; east bleeding, easy bruising. Immunology:  Denies seasonal allergies, sinus problems, and allergic to bee stings. Lymphatic:  Denies lymph node cancer and lymph edema.   Impression & Recommendations:  Problem # 1:  TRIGGER FINGER (ICD-727.03) Assessment New  right thumb Verbal consent was obtained: The finger was prepped with ethyl chloride and  injected with 1:1 injection of .25% sensorcaine, 1cc  and 40 mg of depomedrol, 1cc. There were no complications.  left small  Verbal consent was obtained: The finger was prepped with ethyl chloride and injected with 1:1 injection of .25% sensorcaine, 1cc  and 40 mg of depomedrol, 1cc. There were no complications.  Orders: New Patient Level III ([address]) Joint Aspirate / Injection, Small (60454) Depo- Medrol 40mg  (J1030)  Patient Instructions: 1)  You have received an injection of cortisone today. You may experience increased pain at the injection site. Apply ice pack to the area for 20 minutes every 2 hours and take 2 xtra strength tylenol every 8 hours. This increased pain will usually resolve in 24 hours. The injection will take effect in 3-10 days.  2)  diagnosis Trigger thumb  3)  you may need another injection if this does not help after 2 weeks  4)  Please schedule a follow-up appointment as needed.  Appended Document: left trigger thumb/sec hor/simpson/bsf examination  Vital signs are stable as recorded  The patient is well developed and nourished, with normal grooming and hygiene. The body habitus is   The pulses and perfusion were normal with normal color, temperature  and no swelling  The coordination and sensation were normal   there is no lymphadenopathy in the upper extremity RIGHT or LEFT  The patient was awake alert and oriented x3 mood and affect were normal  Gait exam does not for the examination or problem  LEFT thumb tenderness over the A1 pulley clicking and popping normal flexion power no instability normal range of motion  RIGHT small finger tenderness over the A1 pulley normal range of motion with clicking and popping normal flexion power no instability

## 2010-03-16 NOTE — Letter (Signed)
Summary: [facility]  [facility]AB[facility]   Imported By: Lind Guest 02/27/2009 09:25:12  _____________________________________________________________________  External Attachment:    Type:   Image     Comment:   External Document

## 2010-03-16 NOTE — Assessment & Plan Note (Signed)
Summary: F UP   Vital Signs:  Patient profile:   70 year old male Height:      68 inches Weight:      178 pounds BMI:     27.16 O2 Sat:      97 % Pulse rate:   74 / minute Pulse rhythm:   regular Resp:     16 per minute BP sitting:   122 / 66  (left arm) Cuff size:   regular  Vitals Entered By: Everitt Amber LPN (May 19, 107 1:01 PM)  Nutrition Counseling: Patient's BMI is greater than 25 and therefore counseled on weight management options. CC: Follow up chronic problems Is Patient Diabetic? Yes   CC:  Follow up chronic problems.  History of Present Illness: pt c/o increased and uncontrolled lower ext pain.He is requesting an increase in his meds for this. He also states that he feels as though he needs a higher dose of depression med since he stays feeling depressed oftwen . He denies suicidal or homicidal ideation , he denies hallucinations. Pt denies any recent fever or chills . He denies head or chest congestion. He reports continued fluctuation in his blood sugars.   Preventive Screening-Counseling & Management  Alcohol-Tobacco     Smoking Cessation Counseling: yes  Current Medications (verified): 1)  Glipizide 10 Mg  Tabs (Glipizide) .... Take 1 Tablet By Mouth Two Times A Day 2)  Novolog Mix 70/30 70-30 %  Susp (Insulin Aspart Prot & Aspart) .... Inject 30 Units in Am and 5 Units in Pm 3)  Januvia 100 Mg  Tabs (Sitagliptin Phosphate) .... Take 1 Tablet By Mouth Once A Day 4)  Tramadol Hcl 50 Mg  Tabs (Tramadol Hcl) .... Take One To Two Tabs At Bedtime 5)  Simvastatin 20 Mg Tabs (Simvastatin) .... Take 1 Tablet By Mouth Once A Day 6)  Enalapril Maleate 20 Mg  Tabs (Enalapril Maleate) .... Take 2 Tabs Once Daily 7)  Losartan Potassium 100 Mg Tabs (Losartan Potassium) .... Take 1 Tablet By Mouth Once A Day 8)  Norvasc 10 Mg  Tabs (Amlodipine Besylate) .... Take 1 Tablet By Mouth Once A Day 9)  Hydrochlorothiazide 25 Mg  Tabs (Hydrochlorothiazide) .... Take 1 Tablet  By Mouth Once A Day 10)  Klor-Con M20 20 Meq  Tbcr (Potassium Chloride Crys Cr) .... Take 1 Tablet By Mouth Once A Day 11)  Alprazolam 0.5 Mg  Tabs (Alprazolam) .... Take One Tablet By Mouth At Bedtime  or As Directed 12)  Advair Diskus 100-50 Mcg/dose  Misc (Fluticasone-Salmeterol) .... Use As Directed 13)  Fluticasone Propionate 50 Mcg/act  Susp (Fluticasone Propionate) .Marland Kitchen.. 1 To 2 Puffs Per Nostril Twice Daily 14)  Gabapentin 300 Mg Caps (Gabapentin) .... Take 1 Capsule By Mouth Two Times A Day 15)  Bayer Contour Test  Strp (Glucose Blood) .... Four Times A Day Testing 16)  Diazepam 5 Mg Tabs (Diazepam) .... Take 1 Tablet By Mouth Two Times A Day As Needed 17)  Flomax 0.4 Mg Xr24h-Cap (Tamsulosin Hcl) .... Take 1 Tablet By Mouth Once A Day 18)  Citalopram Hydrobromide 20 Mg Tabs (Citalopram Hydrobromide) .... Take 1 Tablet By Mouth Once A Day 19)  Promethazine Hcl 25 Mg Tabs (Promethazine Hcl) .... One Tab By Mouth Once Daily Prn 20)  Gabapentin 300 Mg Caps (Gabapentin) .... Take 1 Capsule By Mouth Three Times A Day 21)  Vicodin 5-500 Mg Tabs (Hydrocodone-Acetaminophen) .... Take 1 Tab By Mouth At Bedtime 22)  Omeprazole 40 Mg Cpdr (Omeprazole) .... Take 1 Capsule By Mouth Once A Day 23)  Fluoxetine Hcl 10 Mg Caps (Fluoxetine Hcl) .... Take 1 Capsule By Mouth Once A Day Start  March 05, 2009  Allergies (verified): No Known Drug Allergies  Review of Systems      See HPI General:  Complains of fatigue and sleep disorder; denies chills and fever; reports difficulty falling and staying asleepp worse in the past 4 months. Eyes:  Denies discharge, eye pain, and red eye. ENT:  Denies hoarseness, nasal congestion, sinus pressure, and sore throat. CV:  Denies chest pain or discomfort, palpitations, and swelling of feet. Resp:  Denies cough and sputum productive. GI:  Denies abdominal pain, constipation, diarrhea, nausea, and vomiting. GU:  Complains of erectile dysfunction; denies  incontinence, nocturia, urinary frequency, and urinary hesitancy. MS:  Complains of low back pain and mid back pain; ubncontrolled bck and lower extremity pain uncontrolled and severe pain. Derm:  Denies itching and rash. Neuro:  Denies headaches, seizures, and sensation of room spinning. Psych:  Complains of depression; denies irritability, mental problems, sense of great danger, suicidal thoughts/plans, and thoughts of violence. Endo:  Denies cold intolerance, excessive hunger, excessive thirst, excessive urination, heat intolerance, polyuria, and weight change; tests on avg 4 times daily, range from 60 to 500. Heme:  Denies abnormal bruising and bleeding. Allergy:  Complains of seasonal allergies.  Physical Exam  General:  Well-developed,well-nourished,in no acute distress; alert,appropriate and cooperative throughout examination HEENT: No facial asymmetry,  EOMI, No sinus tenderness, TM's Clear, oropharynx  pink and moist.   Chest: Clear to auscultation bilaterally.  CVS: S1, S2, No murmurs, No S3.   Abd: Soft, Nontender.  MS: Adequate ROM spine, hips, shoulders and knees.  Ext: No edema.   CNS: CN 2-12 intact, power tone and sensation normal throughout.   Skin: Intact, no visible lesions or rashes.  Psych: Good eye contact, normal affect.  Memory intact, not anxious or depressed appearing.    Impression & Recommendations:  Problem # 1:  INSOMNIA (ICD-780.52) Assessment Deteriorated  The following medications were removed from the medication list:    Lunesta 3 Mg Tabs (Eszopiclone) .Marland Kitchen... Take 1 tab by mouth at bedtime His updated medication list for this problem includes:    Zolpidem Tartrate 10 Mg Tabs (Zolpidem tartrate) .Marland Kitchen... Take 1 tab by mouth at bedtime, requests zolpidem instead of lunesta  Problem # 2:  LOW BACK PAIN, CHRONIC (ICD-724.2) Assessment: Deteriorated  The following medications were removed from the medication list:    Vicodin 5-500 Mg Tabs  (Hydrocodone-acetaminophen) .Marland Kitchen... Take 1 tab by mouth at bedtime His updated medication list for this problem includes:    Tramadol Hcl 50 Mg Tabs (Tramadol hcl) .Marland Kitchen... Take one to two tabs at bedtime    Vicodin Es 7.5-750 Mg Tabs (Hydrocodone-acetaminophen) .Marland Kitchen... Take 1 tab by mouth at bedtime  Problem # 3:  GENERALIZED ANXIETY DISORDER (ICD-300.02) Assessment: Deteriorated  The following medications were removed from the medication list:    Cymbalta 60 Mg Cpep (Duloxetine hcl) .Marland Kitchen... Take one cap by mouth once daily    Citalopram Hydrobromide 20 Mg Tabs (Citalopram hydrobromide) .Marland Kitchen... Take 1 tablet by mouth once a day    Fluoxetine Hcl 10 Mg Caps (Fluoxetine hcl) .Marland Kitchen... Take 1 capsule by mouth once a day start  March 05, 2009    Fluoxetine Hcl 20 Mg Caps (Fluoxetine hcl) .Marland Kitchen... Take 1 capsule by mouth once a day His updated medication list for this  problem includes:    Alprazolam 0.5 Mg Tabs (Alprazolam) .Marland Kitchen... Take one tablet by mouth at bedtime  or as directed    Diazepam 5 Mg Tabs (Diazepam) .Marland Kitchen... Take 1 tablet by mouth two times a day as needed    Citalopram Hydrobromide 40 Mg Tabs (Citalopram hydrobromide) .Marland Kitchen... Take 1 tablet by mouth once a day  Problem # 4:  HYPERTENSION (ICD-401.9) Assessment: Unchanged  His updated medication list for this problem includes:    Enalapril Maleate 20 Mg Tabs (Enalapril maleate) .Marland Kitchen... Take 2 tabs once daily    Losartan Potassium 100 Mg Tabs (Losartan potassium) .Marland Kitchen... Take 1 tablet by mouth once a day    Norvasc 10 Mg Tabs (Amlodipine besylate) .Marland Kitchen... Take 1 tablet by mouth once a day    Hydrochlorothiazide 25 Mg Tabs (Hydrochlorothiazide) .Marland Kitchen... Take 1 tablet by mouth once a day  BP today: 122/66 Prior BP: 130/64 (02/17/2009)  Labs Reviewed: K+: 4.9 (05/15/2009) Creat: : 1.07 (05/15/2009)   Chol: 147 (05/15/2009)   HDL: 39 (05/15/2009)   LDL: 86 (05/15/2009)   TG: 112 (05/15/2009)  Problem # 5:  IDDM (ICD-250.01) Assessment: Comment Only  His  updated medication list for this problem includes:    Glipizide 10 Mg Tabs (Glipizide) .Marland Kitchen... Take 1 tablet by mouth two times a day    Novolog Mix 70/30 70-30 % Susp (Insulin aspart prot & aspart) ..... Inject 30 units in am and 5 units in pm    Januvia 100 Mg Tabs (Sitagliptin phosphate) .Marland Kitchen... Take 1 tablet by mouth once a day    Enalapril Maleate 20 Mg Tabs (Enalapril maleate) .Marland Kitchen... Take 2 tabs once daily    Losartan Potassium 100 Mg Tabs (Losartan potassium) .Marland Kitchen... 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   Complete Medication List: 1)  Glipizide 10 Mg Tabs (Glipizide) .... Take 1 tablet by mouth two times a day 2)  Novolog Mix 70/30 70-30 % Susp (Insulin aspart prot & aspart) .... Inject 30 units in am and 5 units in pm 3)  Januvia 100 Mg Tabs (Sitagliptin phosphate) .... Take 1 tablet by mouth once a day 4)  Tramadol Hcl 50 Mg Tabs (Tramadol hcl) .... Take one to two tabs at bedtime 5)  Simvastatin 20 Mg Tabs (Simvastatin) .... Take 1 tablet by mouth once a day 6)  Enalapril Maleate 20 Mg Tabs (Enalapril maleate) .... Take 2 tabs once daily 7)  Losartan Potassium 100 Mg Tabs (Losartan potassium) .... Take 1 tablet by mouth once a day 8)  Norvasc 10 Mg Tabs (Amlodipine besylate) .... Take 1 tablet by mouth once a day 9)  Hydrochlorothiazide 25 Mg Tabs (Hydrochlorothiazide) .... Take 1 tablet by mouth once a day 10)  Klor-con M20 20 Meq Tbcr (Potassium chloride crys cr) .... Take 1 tablet by mouth once a day 11)  Alprazolam 0.5 Mg Tabs (Alprazolam) .... Take one tablet by mouth at bedtime  or as directed 12)  Advair Diskus 100-50 Mcg/dose Misc (Fluticasone-salmeterol) .... Use as directed 13)   Fluticasone Propionate 50 Mcg/act Susp (Fluticasone propionate) .Marland Kitchen.. 1 to 2 puffs per nostril twice daily 14)  Bayer Contour Test Strp (Glucose blood) .... Four times a day testing 15)  Diazepam 5 Mg Tabs (Diazepam) .Marland KitchenMarland KitchenMarland Kitchen  Take 1 tablet by mouth two times a day as needed 16)  Flomax 0.4 Mg Xr24h-cap (Tamsulosin hcl) .... Take 1 tablet by mouth once a day 17)  Promethazine Hcl 25 Mg Tabs (Promethazine hcl) .... One tab by mouth once daily prn 18)  Gabapentin 300 Mg Caps (Gabapentin) .... Take 1 capsule by mouth three times a day 19)  Omeprazole 40 Mg Cpdr (Omeprazole) .... Take 1 capsule by mouth once a day 20)  Gabapentin 300 Mg Caps (Gabapentin) .... One capsule twice daily and two capsules at bedtime 21)  Vicodin Es 7.5-750 Mg Tabs (Hydrocodone-acetaminophen) .... Take 1 tab by mouth at bedtime 22)  Citalopram Hydrobromide 40 Mg Tabs (Citalopram hydrobromide) .... Take 1 tablet by mouth once a day 23)  Zolpidem Tartrate 10 Mg Tabs (Zolpidem tartrate) .... Take 1 tab by mouth at bedtime  Patient Instructions: 1)  Please schedule a follow-up appointment in 3 months. 2)  Tobacco is very bad for your health and your loved ones! You Should stop smoking!. 3)  Stop Smoking Tips: Choose a Quit date. Cut down before the Quit date. decide what you will do as a substitute when you feel the urge to smoke(gum,toothpick,exercise). 4)  It is important that you exercise regularly at least 20 minutes 5 times a week. If you develop chest pain, have severe difficulty breathing, or feel very tired , stop exercising immediately and seek medical attention. 5)  You need to lose weight. Consider a lower calorie diet and regular exercise.  6)  Check your blood sugars regularly. If your readings are usually above : or below 70 you should contact our office. Prescriptions: ZOLPIDEM TARTRATE 10 MG TABS (ZOLPIDEM TARTRATE) Take 1 tab by mouth at bedtime  #30 x 3   Entered and Authorized by:   Syliva Overman MD   Signed  by:   Syliva Overman MD on 05/18/2009   Method used:   Printed then faxed to ...       [address] [phone]* (retail)       [address]       [place], [place]  [address]       Ph: [phone] or [phone]       Fax: [phone]   RxID:   [id number] CITALOPRAM HYDROBROMIDE 40 MG TABS (CITALOPRAM HYDROBROMIDE) Take 1 tablet by mouth once a day  #30 x 3   Entered and Authorized by:   Syliva Overman MD   Signed by:   Syliva Overman MD on 05/18/2009   Method used:   Printed then faxed to ...       [address] [phone]* (retail)       [address]       [place], [place]  [address]       Ph: [phone] or [phone]       Fax: [phone]   RxID:   [id number] FLUOXETINE HCL 20 MG CAPS (FLUOXETINE HCL) Take 1 capsule by mouth once a day  #30 x 3   Entered and Authorized by:   Syliva Overman MD   Signed by:   Syliva Overman MD on 05/18/2009   Method used:   Printed then faxed to ...       [facility]. [phone]* (retail)       [address]       [place], [place]  [address]       Ph: [phone] or [phone]       Fax: [phone]   RxID:   [id number] VICODIN ES 7.5-750 MG TABS (HYDROCODONE-ACETAMINOPHEN) Take 1 tab by mouth at bedtime  #30 x 3   Entered and Authorized by:   Syliva Overman MD   Signed by:   Syliva Overman MD on 05/18/2009   Method used:   Printed then faxed to ...       [address] [phone]* (retail)       [address]       [place], [place]  [address]       Ph: [phone] or [phone]       Fax: [phone]   RxID:   [id number] GABAPENTIN 300 MG CAPS (GABAPENTIN) one capsule twice daily and two capsules at bedtime  #120 x 3   Entered and Authorized by:   Syliva Overman MD   Signed by:   Syliva Overman MD on 05/18/2009   Method used:   Printed then faxed to ...       [address] [phone]* (retail)       [address]       [place], [place]  [address]       Ph: [phone] or [phone]       Fax: [phone]   RxID:   [id number]

## 2010-03-16 NOTE — Letter (Signed)
Summary: RX ASSISTANCE  RX ASSISTANCE   Imported By: Lind Guest 12/24/2009 09:27:36  _____________________________________________________________________  External Attachment:    Type:   Image     Comment:   External Document

## 2010-03-16 NOTE — Letter (Signed)
Summary: med review sheet  med review sheet   Imported By: Rudene Anda 11/24/2009 15:57:34  _____________________________________________________________________  External Attachment:    Type:   Image     Comment:   External Document

## 2010-03-16 NOTE — Progress Notes (Signed)
  Phone Note Call from Patient   Summary of Call: [facility] said that Losartan was on backorder and wants to know if you can change it to something else?  Initial call taken by: Everitt Amber LPN,  September 15, 2009 11:47 AM  Follow-up for Phone Call        pls call [facility] , and/or [facility] ,see if they hsve it before we change, thanks Follow-up by: Syliva Overman MD,  September 15, 2009 11:53 AM  Additional Follow-up for Phone Call Additional follow up Details #1::        [facility] said that it is on back order but we can send it to them and they could try to get him his whole rx    Prescriptions: LOSARTAN POTASSIUM 100 MG TABS (LOSARTAN POTASSIUM) Take 1 tablet by mouth once a day  #30 x 2   Entered by:   Everitt Amber LPN   Authorized by:   Syliva Overman MD   Signed by:   Everitt Amber LPN on 13/09/6576   Method used:   Electronically to        [facility]* (retail)       [address] [place], [place]  [address]       Ph: [phone]       Fax: [phone]   RxID:   [id number]

## 2010-03-16 NOTE — Letter (Signed)
[facility]   Imported By: Lind Guest 12/15/2009 11:31:38  _____________________________________________________________________  External Attachment:    Type:   Image     Comment:   External Document

## 2010-03-16 NOTE — Assessment & Plan Note (Signed)
Summary: OV   Vital Signs:  Patient profile:   70 year old male Height:      68 inches Weight:      175 pounds BMI:     26.70 O2 Sat:      97 % Pulse (ortho):   75 / minute Pulse rhythm:   regular Resp:     16 per minute BP sitting:   130 / 64 Cuff size:   regular  Vitals Entered By: Everitt Amber (February 17, 2009 1:18 PM)  Nutrition Counseling: Patient's BMI is greater than 25 and therefore counseled on weight management options. CC: Follow up chronic problems Is Patient Diabetic? Yes   CC:  Follow up chronic problems.  History of Present Illness: Reports  that he has been  doing well. Denies recent fever or chills. Denies sinus pressure, nasal congestion , ear pain or sore throat. Denies chest congestion, or cough productive of sputum. Denies chest pain, palpitations, PND, orthopnea or leg swelling. Denies abdominal pain, nausea, vomitting, diarrhea or constipation. Denies change in bowel movements or bloody stool. Denies dysuria , frequency, incontinence or hesitancy.  Denies headaches, vertigo, seizures. Denies depression, anxiety or insomnia. Denies  rash, lesions, or itch.     Preventive Screening-Counseling & Management  Alcohol-Tobacco     Smoking Cessation Counseling: yes  Current Medications (verified): 1)  Glipizide 10 Mg  Tabs (Glipizide) .... Take 1 Tablet By Mouth Two Times A Day 2)  Novolog Mix 70/30 70-30 %  Susp (Insulin Aspart Prot & Aspart) .... Inject 30 Units in Am and 5 Units in Pm 3)  Nexium 40 Mg  Cpdr (Esomeprazole Magnesium) .... Take 1 Tablet By Mouth Once A Day 4)  Januvia 100 Mg  Tabs (Sitagliptin Phosphate) .... Take 1 Tablet By Mouth Once A Day 5)  Tramadol Hcl 50 Mg  Tabs (Tramadol Hcl) .... Take One To Two Tabs At Bedtime 6)  Simvastatin 20 Mg Tabs (Simvastatin) .... Take 1 Tablet By Mouth Once A Day 7)  Enalapril Maleate 20 Mg  Tabs (Enalapril Maleate) .... Take 2 Tabs Once Daily 8)  Losartan Potassium 100 Mg Tabs (Losartan  Potassium) .... Take 1 Tablet By Mouth Once A Day 9)  Norvasc 10 Mg  Tabs (Amlodipine Besylate) .... Take 1 Tablet By Mouth Once A Day 10)  Hydrochlorothiazide 25 Mg  Tabs (Hydrochlorothiazide) .... Take 1 Tablet By Mouth Once A Day 11)  Klor-Con M20 20 Meq  Tbcr (Potassium Chloride Crys Cr) .... Take 1 Tablet By Mouth Once A Day 12)  Alprazolam 0.5 Mg  Tabs (Alprazolam) .... Take One Tablet By Mouth At Bedtime  or As Directed 13)  Cymbalta 60 Mg  Cpep (Duloxetine Hcl) .... Take One Cap By Mouth Once Daily 14)  Advair Diskus 100-50 Mcg/dose  Misc (Fluticasone-Salmeterol) .... Use As Directed 15)  Fluticasone Propionate 50 Mcg/act  Susp (Fluticasone Propionate) .Marland Kitchen.. 1 To 2 Puffs Per Nostril Twice Daily 16)  Gabapentin 300 Mg Caps (Gabapentin) .... Take 1 Capsule By Mouth Two Times A Day 17)  Bayer Contour Test  Strp (Glucose Blood) .... Four Times A Day Testing 18)  Diazepam 5 Mg Tabs (Diazepam) .... Take 1 Tablet By Mouth Two Times A Day As Needed 19)  Flomax 0.4 Mg Xr24h-Cap (Tamsulosin Hcl) .... Take 1 Tablet By Mouth Once A Day 20)  Lunesta 3 Mg Tabs (Eszopiclone) .... Take 1 Tab By Mouth At Bedtime 21)  Citalopram Hydrobromide 20 Mg Tabs (Citalopram Hydrobromide) .... Take 1 Tablet  By Mouth Once A Day 22)  Promethazine Hcl 25 Mg Tabs (Promethazine Hcl) .... One Tab By Mouth Once Daily Prn 23)  Tramadol Hcl 50 Mg Tabs (Tramadol Hcl) .... One To Two Tabs By Mouth Three Times A Day Prn  Allergies (verified): No Known Drug Allergies  Review of Systems General:  Denies chills and fever. Eyes:  Complains of double vision; denies blurring, discharge, itching, and red eye. MS:  uncontrolled lower ext pain since stopping therdarvocet tramadol is not helping. Derm:  Denies itching, lesion(s), and rash. Neuro:  Denies brief paralysis, difficulty with concentration, numbness, poor balance, and seizures. Psych:  Complains of anxiety, depression, irritability, and mental problems; denies sense of  great danger, suicidal thoughts/plans, thoughts of violence, and thoughts /plans of harming others. Endo:  Complains of excessive thirst and excessive urination; marked fluctuation in blood sugars continue. Heme:  Denies abnormal bruising and bleeding. Allergy:  Denies hives or rash and sneezing.  Physical Exam  General:  Well-developed,well-nourished,in no acute distress; alert,appropriate and cooperative throughout examination HEENT: No facial asymmetry,  EOMI, No sinus tenderness, TM's Clear, oropharynx  pink and moist.   Chest: Clear to auscultation bilaterally. decreased air entry throughout. CVS: S1, S2, No murmurs, No S3.   Abd: Soft, Nontender.  UK:GURKYHCWC  ROM spine,adequate in hips, shoulders and knees.  Ext: No edema.   CNS: CN 2-12 intact, power tone and sensation normal throughout.   Skin: Intact, no visible lesions or rashes.  Psych: Good eye contact, normal affect.  Memory intact, not anxious or depressed appearing.    Impression & Recommendations:  Problem # 1:  INSOMNIA (ICD-780.52) Assessment Comment Only  His updated medication list for this problem includes:    Lunesta 3 Mg Tabs (Eszopiclone) .Marland Kitchen... Take 1 tab by mouth at bedtime  Problem # 2:  LOW BACK PAIN, CHRONIC (ICD-724.2) Assessment: Deteriorated  The following medications were removed from the medication list:    Tramadol Hcl 50 Mg Tabs (Tramadol hcl) ..... One to two tabs by mouth three times a day prn His updated medication list for this problem includes:    Tramadol Hcl 50 Mg Tabs (Tramadol hcl) .Marland Kitchen... Take one to two tabs at bedtime    Vicodin 5-500 Mg Tabs (Hydrocodone-acetaminophen) .Marland Kitchen... Take 1 tab by mouth at bedtime  Problem # 3:  HYPERLIPIDEMIA (ICD-272.4) Assessment: Comment Only  His updated medication list for this problem includes:    Simvastatin 20 Mg Tabs (Simvastatin) .Marland Kitchen... Take 1 tablet by mouth once a day  Orders: T-Hepatic Function [phone]) T-Lipid Profile  [phone])  Labs Reviewed: SGOT: 27 (12/05/2008)   SGPT: 22 (12/05/2008)   HDL:38 (12/05/2008), 38 (07/21/2008)  LDL:31 (12/05/2008), 34 (07/21/2008)  Chol:91 (12/05/2008), 112 (07/21/2008)  Trig:110 (12/05/2008), 198 (07/21/2008)  Problem # 4:  GENERALIZED ANXIETY DISORDER (ICD-300.02) Assessment: Improved  His updated medication list for this problem includes:    Alprazolam 0.5 Mg Tabs (Alprazolam) .Marland Kitchen... Take one tablet by mouth at bedtime  or as directed    Cymbalta 60 Mg Cpep (Duloxetine hcl) .Marland Kitchen... Take one cap by mouth once daily    Diazepam 5 Mg Tabs (Diazepam) .Marland Kitchen... Take 1 tablet by mouth two times a day as needed    Citalopram Hydrobromide 20 Mg Tabs (Citalopram hydrobromide) .Marland Kitchen... Take 1 tablet by mouth once a day    Fluoxetine Hcl 10 Mg Caps (Fluoxetine hcl) .Marland Kitchen... 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   Problem # 6:  HYPERTENSION (ICD-401.9) Assessment: Unchanged  His updated medication list for this problem includes:    Enalapril Maleate 20 Mg Tabs (Enalapril maleate) .Marland Kitchen... Take 2 tabs once daily    Losartan Potassium 100 Mg Tabs (Losartan potassium) .Marland Kitchen... Take 1 tablet by mouth once a day    Norvasc 10 Mg Tabs (Amlodipine besylate) .Marland Kitchen... Take 1 tablet by mouth once a day    Hydrochlorothiazide 25 Mg Tabs (Hydrochlorothiazide) .Marland Kitchen... Take 1 tablet by mouth once a day  Orders: T-Basic Metabolic Panel [phone])  BP today: 130/64 Prior BP: 134/70 (10/29/2008)  Labs Reviewed: K+: 5.3 (12/05/2008) Creat: : 1.29 (12/05/2008)   Chol: 91 (12/05/2008)   HDL: 38 (12/05/2008)   LDL: 31 (12/05/2008)   TG: 110 (12/05/2008)  Problem # 7:  IDDM (ICD-250.01) Assessment: Unchanged  His updated medication list for this problem includes:    Glipizide 10 Mg Tabs (Glipizide) .Marland Kitchen... Take 1 tablet by mouth  two times a day    Novolog Mix 70/30 70-30 % Susp (Insulin aspart prot & aspart) ..... Inject 30 units in am and 5 units in pm    Januvia 100 Mg Tabs (Sitagliptin phosphate) .Marland Kitchen... Take 1 tablet by mouth once a day    Enalapril Maleate 20 Mg Tabs (Enalapril maleate) .Marland Kitchen... Take 2 tabs once daily    Losartan Potassium 100 Mg Tabs (Losartan potassium) .Marland Kitchen... Take 1 tablet by mouth once a day  Orders: Glucose, (CBG) (82962) Hemoglobin A1C (83036) Hemoglobin A1C (83036) T-Urine Microalbumin w/creat. ratio [id number])  Labs Reviewed: Creat: 1.29 (12/05/2008)    Reviewed HgBA1c results: 7.7 (02/17/2009)  7.7 (10/29/2008)  Complete Medication List: 1)  Glipizide 10 Mg Tabs (Glipizide) .... Take 1 tablet by mouth two times a day 2)  Novolog Mix 70/30 70-30 % Susp (Insulin aspart prot & aspart) .... Inject 30 units in am and 5 units in pm 3)  Januvia 100 Mg Tabs (Sitagliptin phosphate) .... Take 1 tablet by mouth once a day 4)  Tramadol Hcl 50 Mg Tabs (Tramadol hcl) .... Take one to two tabs at bedtime 5)  Simvastatin 20 Mg Tabs (Simvastatin) .... Take 1 tablet by mouth once a day 6)  Enalapril Maleate 20 Mg Tabs (Enalapril maleate) .... Take 2 tabs once daily 7)  Losartan Potassium 100 Mg Tabs (Losartan potassium) .... Take 1 tablet by mouth once a day 8)  Norvasc 10 Mg Tabs (Amlodipine besylate) .... Take 1 tablet by mouth once a day 9)  Hydrochlorothiazide 25 Mg Tabs (Hydrochlorothiazide) .... Take 1 tablet by mouth once a day 10)  Klor-con M20 20 Meq Tbcr (Potassium chloride crys cr) .... Take 1 tablet by mouth once a day 11)  Alprazolam 0.5 Mg Tabs (Alprazolam) .... Take one tablet by mouth at bedtime  or as directed 12)  Cymbalta 60 Mg Cpep (Duloxetine hcl) .... Take one cap by mouth once daily 13)  Advair Diskus 100-50 Mcg/dose Misc (Fluticasone-salmeterol) .... Use as directed 14)  Fluticasone Propionate 50 Mcg/act Susp (Fluticasone propionate) .Marland Kitchen.. 1 to 2 puffs per nostril twice  daily 15)  Gabapentin 300 Mg Caps (Gabapentin) .... Take 1 capsule by mouth two times a day 16)  Bayer Contour Test Strp (Glucose blood) .... Four times a day testing 17)  Diazepam 5 Mg Tabs (Diazepam) .... Take 1 tablet by mouth two times a day as needed 18)  Flomax 0.4 Mg Xr24h-cap (Tamsulosin hcl) .... Take  1 tablet by mouth once a day 19)  Lunesta 3 Mg Tabs (Eszopiclone) .... Take 1 tab by mouth at bedtime 20)  Citalopram Hydrobromide 20 Mg Tabs (Citalopram hydrobromide) .... Take 1 tablet by mouth once a day 21)  Promethazine Hcl 25 Mg Tabs (Promethazine hcl) .... One tab by mouth once daily prn 22)  Gabapentin 300 Mg Caps (Gabapentin) .... Take 1 capsule by mouth three times a day 23)  Vicodin 5-500 Mg Tabs (Hydrocodone-acetaminophen) .... Take 1 tab by mouth at bedtime 24)  Omeprazole 40 Mg Cpdr (Omeprazole) .... Take 1 capsule by mouth once a day 25)  Fluoxetine Hcl 10 Mg Caps (Fluoxetine hcl) .... Take 1 capsule by mouth once a day start  March 05, 2009  Other Orders: Orthopedic Referral (Ortho) T-PSA [phone])  Patient Instructions: 1)  Please schedule a follow-up appointment in 3 months. 2)  Tobacco is very bad for your health and your loved ones! You Should stop smoking!. 3)  Stop Smoking Tips: Choose a Quit date. Cut down before the Quit date. decide what you will do as a substitute when you feel the urge to smoke(gum,toothpick,exercise). 4)  BMP prior to visit, ICD-9: 5)  Hepatic Panel prior to visit, ICD-9: 6)  Lipid Panel prior to visit, ICD-9:   fasting in 3 months. 7)  PSA prior to visit, ICD-9: 8)  Urine Microalbumin prior to visit, ICD-9: 9)  the cymbalta taper will be written out. 10)  med changes as discussed  11)  you will be eferred to ortho about your left trigger thumb. Prescriptions: FLUOXETINE HCL 10 MG CAPS (FLUOXETINE HCL) Take 1 capsule by mouth once a day sTART  March 05, 2009  #30 x 3   Entered by:   Everitt Amber   Authorized by:   Syliva Overman MD   Signed by:   Everitt Amber on 02/17/2009   Method used:   Printed then faxed to ...       [address] [phone]* (retail)       [address]       [place], [place]  [address]       Ph: [phone] or [phone]       Fax: [phone]   RxID:   [id number] OMEPRAZOLE 40 MG CPDR (OMEPRAZOLE) Take 1 capsule by mouth once a day  #30 x 3   Entered by:   Everitt Amber   Authorized by:   Syliva Overman MD   Signed by:   Everitt Amber on 02/17/2009   Method used:   Printed then faxed to ...       [address] [phone]* (retail)       [address]       [place], [place]  [address]       Ph: [phone] or [phone]       Fax: [phone]   RxID:   [id number] VICODIN 5-500 MG TABS (HYDROCODONE-ACETAMINOPHEN) Take 1 tab by mouth at bedtime  #30 x 3   Entered by:   Everitt Amber   Authorized by:   Syliva Overman MD   Signed by:   Everitt Amber on 02/17/2009   Method used:   Printed then faxed to ...       [facility]. [phone]* (retail)       [address]       [place], [place]  [address]       Ph: [phone] or [phone]       Fax: [phone]   RxID:   [id number] GABAPENTIN 300 MG CAPS (GABAPENTIN) Take 1 capsule by mouth three times a day  #90 x 3   Entered by:   Everitt Amber   Authorized by:   Syliva Overman MD   Signed by:   Everitt Amber on 02/17/2009   Method used:   Printed then faxed to ...       [address] [phone]* (retail)       [address]       [place], [place]  [address]       Ph: [phone] or [phone]       Fax: [phone]   RxID:   [id number]   Laboratory Results   Blood Tests   Date/Time Received: February 17, 2009  Date/Time Reported: February 17, 2009   Glucose (random): 190 mg/dL   (Normal Range: 15-400) HGBA1C: 7.7%   (Normal Range: Non-Diabetic - 3-6%   Control Diabetic - 6-8%)

## 2010-03-16 NOTE — Letter (Signed)
Summary: RX ASSISTANCE  RX ASSISTANCE   Imported By: Lind Guest 10/29/2009 16:53:13  _____________________________________________________________________  External Attachment:    Type:   Image     Comment:   External Document

## 2010-03-16 NOTE — Letter (Signed)
Summary: RX ASSISTANCE  RX ASSISTANCE   Imported By: Lind Guest 10/30/2009 07:48:16  _____________________________________________________________________  External Attachment:    Type:   Image     Comment:   External Document

## 2010-03-16 NOTE — Progress Notes (Signed)
Summary: refill  Phone Note Call from Patient   Summary of Call: pt needs advair but if to high something that don't cost much. [phone] please call back and let him know something Initial call taken by: Rudene Anda,  August 10, 2009 11:51 AM  Follow-up for Phone Call        patient aware we are awaiting samples for him, will call when available Follow-up by: Adella Hare LPN,  August 10, 2009 2:27 PM

## 2010-03-16 NOTE — Assessment & Plan Note (Signed)
Summary: f up   Vital Signs:  Patient profile:   70 year old male Height:      68 inches Weight:      178.50 pounds BMI:     27.24 O2 Sat:      98 % on Room air Pulse rate:   66 / minute Pulse rhythm:   regular Resp:     16 per minute BP sitting:   110 / 50  (left arm)  Vitals Entered By: Adella Hare LPN (August 20, 1608 1:39 PM)  Nutrition Counseling: Patient's BMI is greater than 25 and therefore counseled on weight management options.  O2 Flow:  Room air CC: follow-up visit Is Patient Diabetic? Yes Did you bring your meter with you today? No Pain Assessment Patient in pain? no        CC:  follow-up visit.  History of Present Illness: Tjhe ppt comes in today frustrated and angry that he has been unable to obtain januvia samples, as he is in the donut hole. He states his sugars continue to fluctuate dramatically. He denies any recent fever or chills. He continues to smoke , with no quit date set. He still hass not decided on a colonscopy. He sttaes his depression is essentially unchanged from the last visit, though helped by meds. He reports back and joint pain and states this is a problem as it limits his activity. He denies any recent chest pain, palpitations, pND or orthopnea.   Preventive Screening-Counseling & Management  Alcohol-Tobacco     Smoking Cessation Counseling: yes  Current Medications (verified): 1)  Glipizide 10 Mg  Tabs (Glipizide) .... Take 1 Tablet By Mouth Two Times A Day 2)  Novolog Mix 70/30 70-30 %  Susp (Insulin Aspart Prot & Aspart) .... Inject 30 Units in Am and 5 Units in Pm 3)  Januvia 100 Mg  Tabs (Sitagliptin Phosphate) .... Take 1 Tablet By Mouth Once A Day 4)  Simvastatin 20 Mg Tabs (Simvastatin) .... Take 1 Tablet By Mouth Once A Day 5)  Enalapril Maleate 20 Mg  Tabs (Enalapril Maleate) .... Take 2 Tabs Once Daily 6)  Losartan Potassium 100 Mg Tabs (Losartan Potassium) .... Take 1 Tablet By Mouth Once A Day 7)  Norvasc 10 Mg   Tabs (Amlodipine Besylate) .... Take 1 Tablet By Mouth Once A Day 8)  Hydrochlorothiazide 25 Mg  Tabs (Hydrochlorothiazide) .... Take 1 Tablet By Mouth Once A Day 9)  Klor-Con M20 20 Meq  Tbcr (Potassium Chloride Crys Cr) .... Take 1 Tablet By Mouth Once A Day 10)  Alprazolam 0.5 Mg  Tabs (Alprazolam) .... Take One Tablet By Mouth At Bedtime  or As Directed 11)  Advair Diskus 100-50 Mcg/dose  Misc (Fluticasone-Salmeterol) .... Use As Directed 12)  Fluticasone Propionate 50 Mcg/act  Susp (Fluticasone Propionate) .Marland Kitchen.. 1 To 2 Puffs Per Nostril Twice Daily 13)  Bayer Contour Test  Strp (Glucose Blood) .... Four Times A Day Testing 14)  Diazepam 5 Mg Tabs (Diazepam) .... Take 1 Tablet By Mouth Two Times A Day As Needed 15)  Flomax 0.4 Mg Xr24h-Cap (Tamsulosin Hcl) .... Take 1 Tablet By Mouth Once A Day 16)  Omeprazole 40 Mg Cpdr (Omeprazole) .... Take 1 Capsule By Mouth Once A Day 17)  Gabapentin 300 Mg Caps (Gabapentin) .... One Capsule Twice Daily and Two Capsules At Bedtime 18)  Vicodin Es 7.5-750 Mg Tabs (Hydrocodone-Acetaminophen) .... Take 1 Tab By Mouth At Bedtime 19)  Citalopram Hydrobromide 40 Mg Tabs (Citalopram  Hydrobromide) .... Take 1 Tablet By Mouth Once A Day 20)  Zolpidem Tartrate 10 Mg Tabs (Zolpidem Tartrate) .... Take 1 Tab By Mouth At Bedtime  Allergies (verified): No Known Drug Allergies  Review of Systems      See HPI General:  Complains of fatigue and weakness; denies chills and fever. Eyes:  Denies double vision, eye pain, and red eye. ENT:  Complains of decreased hearing; denies nasal congestion, sinus pressure, and sore throat. CV:  Complains of fatigue; denies chest pain or discomfort, difficulty breathing while lying down, palpitations, shortness of breath with exertion, and swelling of feet. Resp:  Complains of shortness of breath; denies cough, sputum productive, and wheezing. GI:  Denies abdominal pain, constipation, diarrhea, nausea, and vomiting. GU:  Complains  of erectile dysfunction; denies discharge, dysuria, nocturia, and urinary frequency. MS:  Complains of joint pain, low back pain, mid back pain, and stiffness; increased and uncontrolled. Derm:  Denies itching, lesion(s), and rash. Neuro:  Denies headaches, seizures, and sensation of room spinning. Psych:  Complains of anxiety and depression; denies easily tearful, irritability, mental problems, suicidal thoughts/plans, thoughts of violence, and unusual visions or sounds. Endo:  Complains of excessive thirst and excessive urination; fluctuationg blood sugars persist, fastings range from 228 to low sugars. Heme:  Denies abnormal bruising and bleeding. Allergy:  Complains of seasonal allergies; denies hives or rash and itching eyes.  Physical Exam  General:  Well-developed,well-nourished,in no acute distress; alert,appropriate and cooperative throughout examination HEENT: No facial asymmetry,  EOMI, No sinus tenderness, TM's Clear, oropharynx  pink and moist.   Chest: Clear to auscultation bilaterally.  CVS: S1, S2, No murmurs, No S3.   Abd: Soft, Nontender.  MS: decreased  ROM spine,adequate in hips, shoulders and knees.  Ext: No edema.   CNS: CN 2-12 intact, power tone and sensation normal throughout.   Skin: Intact, no visible lesions or rashes.  Psych: Good eye contact, normal affect.  Memory intact, not anxious or depressed appearing.    Impression & Recommendations:  Problem # 1:  INSOMNIA (ICD-780.52) Assessment Unchanged  His updated medication list for this problem includes:    Zolpidem Tartrate 10 Mg Tabs (Zolpidem tartrate) .Marland Kitchen... Take 1 tab by mouth at bedtime  Discussed sleep hygiene.   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 Take one to two tabs at bedtime    Vicodin Es 7.5-750 Mg Tabs (Hydrocodone-acetaminophen) .Marland Kitchen... Take 1 tab by mouth at bedtime His updated medication list for this problem includes:    Vicodin Es 7.5-750 Mg Tabs (Hydrocodone-acetaminophen) .Marland Kitchen... Take 1 tablet by mouth two times a day  Problem # 4:  GENERALIZED ANXIETY DISORDER (ICD-300.02) Assessment: Improved  His updated medication list for this problem includes:    Alprazolam 0.5 Mg Tabs (Alprazolam) .Marland Kitchen... Take one tablet by mouth at bedtime  or as directed    Diazepam 5 Mg Tabs (Diazepam) .Marland Kitchen... Take 1 tablet by mouth two times a day as needed    Citalopram Hydrobromide 40 Mg Tabs (Citalopram hydrobromide) .Marland Kitchen... Take 1 tablet by mouth once a day  Problem # 5:  HYPERTENSION (ICD-401.9) Assessment: Unchanged  His updated medication list for this problem includes:    Enalapril Maleate 20 Mg Tabs (Enalapril maleate) .Marland Kitchen... Take  2 tabs once daily    Losartan Potassium 100 Mg Tabs (Losartan potassium) .Marland Kitchen... Take 1 tablet by mouth once a day    Norvasc 10 Mg Tabs (Amlodipine besylate) .Marland Kitchen... Take 1 tablet by mouth once a day    Hydrochlorothiazide 25 Mg Tabs (Hydrochlorothiazide) .Marland Kitchen... Take 1 tablet by mouth once a day  Orders: T-Basic Metabolic Panel [id number])  BP today: 110/50 Prior BP: 122/66 (05/18/2009)  Labs Reviewed: K+: 4.9 (05/15/2009) Creat: : 1.07 (05/15/2009)   Chol: 147 (05/15/2009)   HDL: 39 (05/15/2009)   LDL: 86 (05/15/2009)   TG: 112 (05/15/2009)  Problem # 6:  IDDM (ICD-250.01) Assessment: Comment Only  His updated medication list for this problem includes:    Glipizide 10 Mg Tabs (Glipizide) .Marland Kitchen... Take 1 tablet by mouth two times a day    Novolog Mix 70/30 70-30 % Susp (Insulin aspart prot & aspart) ..... Inject 30 units in am and 5 units in pm    Januvia 100 Mg Tabs (Sitagliptin phosphate) .Marland Kitchen... Take 1 tablet by mouth once a day    Enalapril Maleate 20 Mg Tabs (Enalapril maleate)  .Marland Kitchen... Take 2 tabs once daily    Losartan Potassium 100 Mg Tabs (Losartan potassium) .Marland Kitchen... Take 1 tablet by mouth once a day    Metformin Hcl 1000 Mg Tabs (Metformin hcl) .Marland Kitchen... Take 1 tablet by mouth two times a day  Orders: T- Hemoglobin A1C ([id number])  Labs Reviewed: Creat: 1.07 (05/15/2009)    Reviewed HgBA1c results: 8.5 (05/18/2009)  7.7 (02/17/2009)  Complete Medication List: 1)  Glipizide 10 Mg Tabs (Glipizide) .... Take 1 tablet by mouth two times a day 2)  Novolog Mix 70/30 70-30 % Susp (Insulin aspart prot & aspart) .... Inject 30 units in am and 5 units in pm 3)  Januvia 100 Mg Tabs (Sitagliptin phosphate) .... Take 1 tablet by mouth once a day 4)  Simvastatin 20 Mg Tabs (Simvastatin) .... Take 1 tablet by mouth once a day 5)  Enalapril Maleate 20 Mg Tabs (Enalapril maleate) .... Take 2 tabs once daily 6)  Losartan Potassium 100 Mg Tabs (Losartan potassium) .... Take 1 tablet by mouth once a day 7)  Norvasc 10 Mg Tabs (Amlodipine besylate) .... Take 1 tablet by mouth once a day 8)  Hydrochlorothiazide 25 Mg Tabs (Hydrochlorothiazide) .... Take 1 tablet by mouth once a day 9)  Klor-con M20 20 Meq Tbcr (Potassium chloride crys cr) .... Take 1 tablet by mouth once a day 10)  Alprazolam 0.5 Mg Tabs (Alprazolam) .... Take one tablet by mouth at bedtime  or as directed 11)  Advair Diskus 100-50 Mcg/dose Misc (Fluticasone-salmeterol) .... Use as directed 12)  Fluticasone Propionate 50 Mcg/act Susp (Fluticasone propionate) .Marland Kitchen.. 1 to 2 puffs per nostril twice daily 13)  Bayer Contour Test Strp (Glucose blood) .... Four times a day testing 14)  Diazepam 5 Mg Tabs (Diazepam) .... Take 1 tablet by mouth two times a day as needed 15)  Flomax 0.4 Mg Xr24h-cap (Tamsulosin hcl) .... Take 1 tablet by mouth once a day 16)  Omeprazole 40 Mg Cpdr (Omeprazole) .... Take 1 capsule by mouth once a day 17)  Gabapentin 300 Mg Caps (Gabapentin) .... One capsule twice daily and two capsules at  bedtime 18)  Citalopram Hydrobromide 40 Mg Tabs (Citalopram hydrobromide) .... Take 1 tablet by mouth once a day 19)  Zolpidem Tartrate 10 Mg Tabs (Zolpidem tartrate) .... Take 1 tab by mouth at bedtime 20)  Vicodin Es 7.5-750 Mg Tabs (  Hydrocodone-acetaminophen) .... Take 1 tablet by mouth two times a day 21)  Metformin Hcl 1000 Mg Tabs (Metformin hcl) .... Take 1 tablet by mouth two times a day 22)  Tessalon Perles 100 Mg Caps (Benzonatate) .... Take 1 capsule by mouth three times a day as needed 23)  Advair Diskus 250-50 Mcg/dose Aepb (Fluticasone-salmeterol) .... One puff two times a day  Other Orders: T-CBC w/Diff ([id number])  Patient Instructions: 1)  Please schedule a follow-up appointment in 3 months. 2)  Tobacco is very bad for your health and your loved ones! You Should stop smoking!. 3)  Stop Smoking Tips: Choose a Quit date. Cut down before the Quit date. decide what you will do as a substitute when you feel the urge to smoke(gum,toothpick,exercise). 4)  It is important that you exercise regularly at least 20 minutes 5 times a week. If you develop chest pain, have severe difficulty breathing, or feel very tired , stop exercising immediately and seek medical attention. 5)  You need to lose weight. Consider a lower calorie diet and regular exercise.  6)  hold off on insulin for the next 1 to 2 weeks, and see how your sugar does on the metformon. 7)  If you do not get a call for samples by next thursday,pls lv a msg for me to call you. 8)  Call in 10 to 14 days for help with yoour blood sugar if you need it. 9)  pls get a colonscopy  10)  new dose of pain med Prescriptions: TESSALON PERLES 100 MG CAPS (BENZONATATE) Take 1 capsule by mouth three times a day as needed  #30 x 1   Entered and Authorized by:   Syliva Overman MD   Signed by:   Syliva Overman MD on 08/20/2009   Method used:   Print then Give to Patient   RxID:   [id number] METFORMIN HCL 1000 MG TABS  (METFORMIN HCL) Take 1 tablet by mouth two times a day  #180 x 2   Entered and Authorized by:   Syliva Overman MD   Signed by:   Syliva Overman MD on 08/20/2009   Method used:   Printed then faxed to ...       [address] [phone]* (retail)       [address]       [place], [place]  [address]       Ph: [phone] or [phone]       Fax: [phone]   RxID:   [id number] VICODIN ES 7.5-750 MG TABS (HYDROCODONE-ACETAMINOPHEN) Take 1 tablet by mouth two times a day  #60 x 3   Entered and Authorized by:   Syliva Overman MD   Signed by:   Syliva Overman MD on 08/20/2009   Method used:   Printed then faxed to ...       [address] [phone]* (retail)       [address]       [place], [place]  [address]       Ph: [phone] or [phone]       Fax: [phone]   RxID:   [id number]

## 2010-03-16 NOTE — Letter (Signed)
Summary: medical release  medical release   Imported By: Lind Guest 04/17/2009 16:31:24  _____________________________________________________________________  External Attachment:    Type:   Image     Comment:   External Document

## 2010-03-18 NOTE — Letter (Signed)
Summary: [facility]  [facility]   Imported By: Lind Guest 01/25/2010 10:04:44  _____________________________________________________________________  External Attachment:    Type:   Image     Comment:   External Document

## 2010-03-18 NOTE — Letter (Signed)
[facility]: [facility]   Imported By: Lind Guest 03/09/2010 10:58:20  _____________________________________________________________________  External Attachment:    Type:   Image     Comment:   External Document

## 2010-03-18 NOTE — Letter (Signed)
Summary: diabetic shoes  diabetic shoes   Imported By: Lind Guest 02/24/2010 09:22:08  _____________________________________________________________________  External Attachment:    Type:   Image     Comment:   External Document

## 2010-03-18 NOTE — Letter (Signed)
[facility]   Imported By: Lind Guest 02/24/2010 11:25:52  _____________________________________________________________________  External Attachment:    Type:   Image     Comment:   External Document

## 2010-03-18 NOTE — Assessment & Plan Note (Signed)
Summary: office visit   Vital Signs:  Patient profile:   70 year old male Height:      68 inches Weight:      167.50 pounds BMI:     25.56 O2 Sat:      98 % Pulse rate:   64 / minute Pulse rhythm:   regular Resp:     16 per minute BP sitting:   140 / 60  (left arm) Cuff size:   regular  Vitals Entered By: Everitt Amber LPN (March 01, 2010 1:05 PM)  Nutrition Counseling: Patient's BMI is greater than 25 and therefore counseled on weight management options. CC: Follow up chronic problems, has been having alot of leg pain. 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 Also needs something for cough, producing some lightish green phlegm    CC:  Follow up chronic problems, has been having alot of leg pain. 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 Also needs something for cough, and producing some lightish green phlegm .  History of Present Illness: Coughinging at night, waking up at night for the last 3 weeks, sputum is yellow, needs meds. Nicotine use is unchanged, avg 18/day Reports  that he has not been  doing well. Denies recent fever or chills. Denies sinus pressure, nasal congestion , ear pain or sore throat.  Denies chest pain, palpitations, PND, orthopnea or leg swelling. Denies abdominal pain, nausea, vomitting, diarrhea or constipation. Denies change in bowel movements or bloody stool. Denies dysuria , frequency, incontinence or hesitancy.  Denies headaches, vertigo, seizures. Denies uncontrolled depression, anxiety or insomnia.He is on medication which is helping him, and he has excellent family support. Denies  rash, lesions, or itch.     Preventive Screening-Counseling & Management  Alcohol-Tobacco     Smoking Cessation Counseling: yes  Current Medications (verified): 1)  Januvia 100 Mg  Tabs (Sitagliptin Phosphate) .... Take 1 Tablet By Mouth Once A Day 2)  Losartan Potassium 100 Mg Tabs (Losartan Potassium) .... Take 1 Tablet By Mouth Once A Day 3)   Norvasc 10 Mg  Tabs (Amlodipine Besylate) .... Take 1 Tablet By Mouth Once A Day 4)  Alprazolam 0.5 Mg  Tabs (Alprazolam) .... Take One Tablet By Mouth At Bedtime  or As Directed 5)  Advair Diskus 100-50 Mcg/dose  Misc (Fluticasone-Salmeterol) .... Use As Directed 6)  Fluticasone Propionate 50 Mcg/act  Susp (Fluticasone Propionate) .Marland Kitchen.. 1 To 2 Puffs Per Nostril Twice Daily 7)  Bayer Contour Test  Strp (Glucose Blood) .... Four Times A Day Testing 8)  Flomax 0.4 Mg Xr24h-Cap (Tamsulosin Hcl) .... Take 1 Tablet By Mouth Once A Day 9)  Gabapentin 300 Mg Caps (Gabapentin) .... One Capsule Twice Daily and Two Capsules At Bedtime 10)  Citalopram Hydrobromide 40 Mg Tabs (Citalopram Hydrobromide) .... Take 1 Tablet By Mouth Once A Day 11)  Zolpidem Tartrate 10 Mg Tabs (Zolpidem Tartrate) .... Take 1 Tab By Mouth At Bedtime 12)  Vicodin Es 7.5-750 Mg Tabs (Hydrocodone-Acetaminophen) .... Take 1 Tablet By Mouth Two Times A Day 13)  Metformin Hcl 1000 Mg Tabs (Metformin Hcl) .... Take 1 Tablet By Mouth Two Times A Day 14)  Pravastatin Sodium 40 Mg Tabs (Pravastatin Sodium) .... Take 1 Tab By Mouth At Bedtime 15)  Omeprazole 40 Mg Cpdr (Omeprazole) .... Take 1 Capsule By Mouth Once A Day As Needed Fo Stomach Pain 16)  Novolog Mix 70/30 70-30 % Susp (Insulin Aspart Prot & Aspart) .... 25  Units Twice Daily 17)  Alcohol Preps  Pads (Alcohol Swabs) .... Uad Dx:250.00 18)  Insulin Syringe 29g X 1/2" 1 Ml Misc (Insulin Syringe-Needle U-100) .... Uad  Allergies (verified): No Known Drug Allergies  Review of Systems      See HPI General:  Complains of fatigue and sleep disorder. Eyes:  Denies discharge and red eye. Resp:  Complains of cough and sputum productive. MS:  Complains of low back pain and mid back pain. Endo:  Denies cold intolerance, excessive thirst, excessive urination, and heat intolerance; tests obn avg 3 times daily,blood sugars continue to fluctuate but avg around 250.  Physical  Exam  General:  Well-developed,well-nourished,in no acute distress; alert,appropriate and cooperative throughout examination HEENT: No facial asymmetry,  EOMI, No sinus tenderness, TM's Clear, oropharynx  pink and moist.   Chest: decreased air entry, bilateral crackles, no wheezes CVS: S1, S2, No murmurs, No S3.   Abd: Soft, Nontender.  MS: decreased  ROM spine,adequate in  hips, shoulders and knees.  Ext: No edema.   CNS: CN 2-12 intact, power tone and sensation normal throughout.   Skin: Intact, no visible lesions or rashes.  Psych: Good eye contact, normal affect.  Memory intact, not anxious or depressed appearing.   Diabetes Management Exam:    Foot Exam (with socks and/or shoes not present):       Sensory-Monofilament:          Left foot: diminished          Right foot: diminished       Inspection:          Left foot: normal          Right foot: normal       Nails:          Left foot: normal          Right foot: normal .P  Impression & Recommendations:  Problem # 1:  ACUTE BRONCHITIS (ICD-466.0) Assessment Comment Only  The following medications were removed from the medication list:    Advair Diskus 250-50 Mcg/dose Aepb (Fluticasone-salmeterol) ..... One puff two times a day His updated medication list for this problem includes:    Advair Diskus 100-50 Mcg/dose Misc (Fluticasone-salmeterol) ..... Use as directed    Tessalon Perles 100 Mg Caps (Benzonatate) .Marland Kitchen... Take 1 capsule by mouth three times a day as needed for cough    Penicillin V Potassium 500 Mg Tabs (Penicillin v potassium) .Marland Kitchen... Take 1 tablet by mouth three times a day  Orders: Medicare Electronic Prescription [id number])  Problem # 2:  INSOMNIA (ICD-780.52) Assessment: Unchanged  His updated medication list for this problem includes:    Zolpidem Tartrate 10 Mg Tabs (Zolpidem tartrate) .Marland Kitchen... Take 1 tab by mouth at bedtime  Discussed sleep hygiene.   Problem # 3:  NICOTINE ADDICTION  (ICD-305.1) Assessment: Unchanged  Encouraged smoking cessation and discussed different methods for smoking cessation.   Problem # 4:  HYPERTENSION (ICD-401.9) Assessment: Deteriorated  His updated medication list for this problem includes:    Losartan Potassium 100 Mg Tabs (Losartan potassium) .Marland Kitchen... Take 1 tablet by mouth once a day    Norvasc 10 Mg Tabs (Amlodipine besylate) .Marland Kitchen... Take 1 tablet by mouth once a day  Orders: T-Basic Metabolic Panel ([id number])  BP today: 140/60 Prior BP: 138/66 (11/24/2009)  Labs Reviewed: K+: 4.8 (02/26/2010) Creat: : 0.95 (02/26/2010)   Chol: 134 (02/26/2010)   HDL: 30 (02/26/2010)   LDL: 81 (02/26/2010)   TG: 116 (02/26/2010)  Problem # 5:  IDDM (ICD-250.01) Assessment: Unchanged  His updated medication list for this problem includes:    Januvia 100 Mg Tabs (Sitagliptin phosphate) .Marland Kitchen... Take 1 tablet by mouth once a day    Losartan Potassium 100 Mg Tabs (Losartan potassium) .Marland Kitchen... Take 1 tablet by mouth once a day    Metformin Hcl 1000 Mg Tabs (Metformin hcl) .Marland Kitchen... Take 1 tablet by mouth two times a day    Novolog Mix 70/30 70-30 % Susp (Insulin aspart prot & aspart) .Marland Kitchen... 25 units twice daily Patient advised to reduce carbs and sweets, commit to regular physical activity, take meds as prescribed, test blood sugars as directed, and attempt to lose weight , to improve blood sugar control.  Orders: T- Hemoglobin A1C ([id number])  Labs Reviewed: Creat: 0.95 (02/26/2010)    Reviewed HgBA1c results: 8.1 (02/26/2010)  8.1 (11/23/2009)  Complete Medication List: 1)  Januvia 100 Mg Tabs (Sitagliptin phosphate) .... Take 1 tablet by mouth once a day 2)  Losartan Potassium 100 Mg Tabs (Losartan potassium) .... Take 1 tablet by mouth once a day 3)  Norvasc 10 Mg Tabs (Amlodipine besylate) .... Take 1 tablet by mouth once a day 4)  Alprazolam 0.5 Mg Tabs (Alprazolam) .... Take one tablet by mouth at bedtime  or as directed 5)  Advair Diskus  100-50 Mcg/dose Misc (Fluticasone-salmeterol) .... Use as directed 6)  Fluticasone Propionate 50 Mcg/act Susp (Fluticasone propionate) .Marland Kitchen.. 1 to 2 puffs per nostril twice daily 7)  Bayer Contour Test Strp and Lancets (glucose Blood)  .... Four times a day testing 8)  Flomax 0.4 Mg Xr24h-cap (Tamsulosin hcl) .... Take 1 tablet by mouth once a day 9)  Gabapentin 300 Mg Caps (Gabapentin) .... One capsule twice daily and two capsules at bedtime 10)  Citalopram Hydrobromide 40 Mg Tabs (Citalopram hydrobromide) .... Take 1 tablet by mouth once a day 11)  Zolpidem Tartrate 10 Mg Tabs (Zolpidem tartrate) .... Take 1 tab by mouth at bedtime 12)  Vicodin Es 7.5-750 Mg Tabs (Hydrocodone-acetaminophen) .... Take 1 tablet by mouth two times a day 13)  Metformin Hcl 1000 Mg Tabs (Metformin hcl) .... Take 1 tablet by mouth two times a day 14)  Pravastatin Sodium 40 Mg Tabs (Pravastatin sodium) .... Take 1 tab by mouth at bedtime 15)  Omeprazole 40 Mg Cpdr (Omeprazole) .... Take 1 capsule by mouth once a day as needed fo stomach pain 16)  Novolog Mix 70/30 70-30 % Susp (Insulin aspart prot & aspart) .... 25 units twice daily 17)  Alcohol Preps Pads (Alcohol swabs) .... Uad dx:250.00 18)  Insulin Syringe 30g X 5/16" 1 Ml Misc (Insulin syringe-needle u-100) .... Uad 19)  Tessalon Perles 100 Mg Caps (Benzonatate) .... Take 1 capsule by mouth three times a day as needed for cough 20)  Penicillin V Potassium 500 Mg Tabs (Penicillin v potassium) .... Take 1 tablet by mouth three times a day  Other Orders: T-Vitamin B12 ([id number])  Patient Instructions: 1)  Please schedule a follow-up appointment in 3.5 months. 2)  Tobacco is very bad for your health and your loved ones! You Should stop smoking!.Goal is max of 15 /day when i see you next 3)  Stop Smoking Tips: Choose a Quit date. Cut down before the Quit date. decide what you will do as a substitute when you feel the urge to smoke(gum,toothpick,exercise). 4)   Check your blood sugars regularly. If your readings are usually above250: or below 70 you should contact our office. 5)  PLS start taking  5 to 7 units of insulin every night.Continue 25 units every morning. 6)  You are being treeated for bronchitis 7)  Yourblood sugar is too high still, 8.1 8)  Pls do keep appt for stress test. 9)  BMP prior to visit, ICD-9: 10)  HbgA1C prior to visit, ICD-9: 11)  B12 next draw if unable to add to current labs Prescriptions: ZOLPIDEM TARTRATE 10 MG TABS (ZOLPIDEM TARTRATE) Take 1 tab by mouth at bedtime  #90 x 0   Entered by:   Everitt Amber LPN   Authorized by:   Syliva Overman MD   Signed by:   Everitt Amber LPN on 16/11/9602   Method used:   Printed then faxed to ...       [address] [phone]* (retail)       [address]       [place], [place]  [address]       Ph: [phone] or [phone]       Fax: [phone]   RxID:   [id number] VICODIN ES 7.5-750 MG TABS (HYDROCODONE-ACETAMINOPHEN) Take 1 tablet by mouth two times a day  #60 x 2   Entered by:   Everitt Amber LPN   Authorized by:   Syliva Overman MD   Signed by:   Everitt Amber LPN on 74/25/9563   Method used:   Printed then faxed to ...       [address] [phone]* (retail)       [address]       [place], [place]  [address]       Ph: [phone] or [phone]       Fax: [phone]   RxID:   [id number] BAYER CONTOUR TEST  STRP (GLUCOSE BLOOD) four times a day testing  #1 mnth suppl x 3   Entered by:   Adella Hare LPN   Authorized by:   Syliva Overman MD   Signed by:   Adella Hare LPN on 61/60/7371   Method used:   Electronically to        [facility] [phone]* (retail)       [address]       [place], [place]  [address]       Ph: [phone] or [phone]       Fax: [phone]   RxID:   [id number] INSULIN SYRINGE 30G X 5/16" 1 ML MISC (INSULIN SYRINGE-NEEDLE U-100) UAD  #360 x 1   Entered by:   Everitt Amber LPN   Authorized by:   Syliva Overman MD   Signed by:   Everitt Amber LPN on 53/61/4431   Method used:   Printed then faxed to ...       [address] [phone]* (retail)       [address]       [place], [place]  [address]       Ph: [phone] or [phone]       Fax: [phone]   RxID:   [id number] PENICILLIN V POTASSIUM 500 MG TABS (PENICILLIN V POTASSIUM) Take 1 tablet by mouth three times a day  #30 x 0   Entered and Authorized by:   Syliva Overman MD   Signed by:   Syliva Overman MD on 03/01/2010   Method used:   Electronically to  [address] [phone]* (retail)       [address]       [place], [place]  [address]       Ph: [phone] or [phone]       Fax: [phone]   RxID:   [id number] TESSALON PERLES 100 MG CAPS (BENZONATATE) Take 1 capsule by mouth three times a day as needed for cough  #60 x 3   Entered and Authorized by:   Syliva Overman MD   Signed by:   Syliva Overman MD on 03/01/2010   Method used:   Electronically to        [facility] [phone]* (retail)       [address]       [place], [place]  [address]       Ph: [phone] or [phone]       Fax: [phone]   RxID:   [id number]  pt did not want 90 day supply  Orders Added: 1)  Est. Patient Level IV [42706] 2)  Medicare Electronic Prescription [G8553] 3)  T-Basic Metabolic Panel [[id number]] 4)  T- Hemoglobin A1C [83036-23375] 5)  T-Vitamin B12 [23762-83151]

## 2010-03-18 NOTE — Progress Notes (Signed)
Summary: DIABETE SHOES  Phone Note Call from Patient   Summary of Call: PATIENT CALLED STATES THAT HE BROUGHT A PAPER FROM [facility] FOR DIABETE SHOES BUT HE DID NOT PUT HIS NAME ON  THE PAPTER WANTED TO KNOW  WAS IT READY.. Initial call taken by: Eugenio Hoes,  January 21, 2010 10:29 AM  Follow-up for Phone Call        form iscompleted , pls print office note from octber 2011 and a script is also prepared for him, then fax all 3 to [facility], and let pt know pls Follow-up by: Syliva Overman MD,  January 25, 2010 6:10 AM  Additional Follow-up for Phone Call Additional follow up Details #1::        patient aware Additional Follow-up by: Lind Guest,  January 25, 2010 8:50 AM

## 2010-03-18 NOTE — Progress Notes (Signed)
Summary: medicine  Phone Note Call from Patient   Summary of Call: pt would like for you to call him about getting medicine from [facility] [phone]  [phone] Initial call taken by: Rudene Anda,  February 24, 2010 3:59 PM  Follow-up for Phone Call        duplicate message see phone note 02/25/10 Follow-up by: Adella Hare LPN,  March 01, 2010 9:10 AM

## 2010-03-18 NOTE — Progress Notes (Signed)
  Phone Note From Pharmacy   Caller: [facility] Summary of Call: [phone] ref# [phone] omeprazole increases effectiveness of citalopram, would you like to change the omeprazole or decrease citalopram to 20mg ? Initial call taken by: Adella Hare LPN,  March 01, 2010 2:06 PM  Follow-up for Phone Call        no changes needed Follow-up by: Syliva Overman MD,  March 01, 2010 3:57 PM  Additional Follow-up for Phone Call Additional follow up Details #1::        pharmacy notified Additional Follow-up by: Adella Hare LPN,  March 01, 2010 4:13 PM

## 2010-03-18 NOTE — Letter (Signed)
Summary: rx assistance  rx assistance   Imported By: Lind Guest 02/05/2010 09:04:32  _____________________________________________________________________  External Attachment:    Type:   Image     Comment:   External Document

## 2010-03-18 NOTE — Letter (Signed)
Summary: RX ASSISTANCE   RX ASSISTANCE   Imported By: Lind Guest 03/11/2010 11:56:18  _____________________________________________________________________  External Attachment:    Type:   Image     Comment:   External Document

## 2010-03-18 NOTE — Progress Notes (Signed)
Summary: MEDICINE  Phone Note Call from Patient   Summary of Call: HE LEFT MESSAGE YESTERDAY ON HIS MEDICINE AND IT WAS SENT TO [facility] AND HE NEEDS THIS HE ASKED IF YOU COULD DO IT FOR HIM THANK YOU Initial call taken by: Lind Guest,  February 25, 2010 9:48 AM  Follow-up for Phone Call        called patient no answer Follow-up by: Everitt Amber LPN,  February 26, 2010 8:47 AM  Additional Follow-up for Phone Call Additional follow up Details #1::        patient states he needs to talk to someone from [facility] again and he will let us know what he needs from [place] Additional Follow-up by: Adella Hare LPN,  March 01, 2010 9:07 AM

## 2010-03-18 NOTE — Progress Notes (Signed)
  Phone Note Other Incoming   Caller: dr simpson Summary of Call: pls fax script fortesting up to 4 times daily for this pt , let him know it is a requirement to record and bring a log of his sugars to the visit for his permanenet record since he is testing so frequently, the insurance requires this Initial call taken by: Syliva Overman MD,  February 02, 2010 7:35 PM    Prescriptions: Ramond Dial CONTOUR TEST  STRP (GLUCOSE BLOOD) four times a day testing  #100 Not Spec x 0   Entered by:   Adella Hare LPN   Authorized by:   Syliva Overman MD   Signed by:   Adella Hare LPN on 52/84/1324   Method used:   Electronically to        [facility] [phone]* (retail)       [address]       [place], [place]  [address]       Ph: [phone] or [phone]       Fax: [phone]   RxID:   [id number]

## 2010-03-18 NOTE — Letter (Signed)
Summary: eye exam  eye exam   Imported By: Lind Guest 03/02/2010 11:23:28  _____________________________________________________________________  External Attachment:    Type:   Image     Comment:   External Document

## 2010-04-08 ENCOUNTER — Encounter: Payer: Self-pay | Admitting: Family Medicine

## 2010-04-09 ENCOUNTER — Encounter: Payer: Self-pay | Admitting: Family Medicine

## 2010-04-13 NOTE — Letter (Signed)
Summary: self health assessment  self health assessment   Imported By: Lind Guest 04/08/2010 11:44:51  _____________________________________________________________________  External Attachment:    Type:   Image     Comment:   External Document

## 2010-04-13 NOTE — Letter (Signed)
Summary: diabetic supplies  diabetic supplies   Imported By: Lind Guest 04/09/2010 14:27:54  _____________________________________________________________________  External Attachment:    Type:   Image     Comment:   External Document

## 2010-04-28 ENCOUNTER — Encounter: Payer: Self-pay | Admitting: Family Medicine

## 2010-04-30 LAB — BASIC METABOLIC PANEL
BUN: 20 mg/dL (ref 6–23)
CO2: 24 mEq/L (ref 19–32)
Potassium: 4.3 mEq/L (ref 3.5–5.1)
Sodium: 131 mEq/L — ABNORMAL LOW (ref 135–145)

## 2010-04-30 LAB — CBC
MCHC: 34.2 g/dL (ref 30.0–36.0)
Platelets: 187 10*3/uL (ref 150–400)
RBC: 3.88 MIL/uL — ABNORMAL LOW (ref 4.22–5.81)
RDW: 12.8 % (ref 11.5–15.5)
WBC: 7.6 10*3/uL (ref 4.0–10.5)

## 2010-04-30 LAB — DIFFERENTIAL
Basophils Relative: 0 % (ref 0–1)
Eosinophils Relative: 4 % (ref 0–5)
Lymphs Abs: 2 10*3/uL (ref 0.7–4.0)
Neutro Abs: 4.3 10*3/uL (ref 1.7–7.7)
Neutrophils Relative %: 57 % (ref 43–77)

## 2010-04-30 LAB — OSMOLALITY, URINE: Osmolality, Ur: 306 mOsm/kg — ABNORMAL LOW (ref 390–1090)

## 2010-04-30 LAB — GLUCOSE, CAPILLARY

## 2010-05-04 NOTE — Letter (Signed)
[facility]   Imported By: Lind Guest 04/30/2010 08:10:59  _____________________________________________________________________  External Attachment:    Type:   Image     Comment:   External Document

## 2010-05-17 ENCOUNTER — Telehealth: Payer: Self-pay | Admitting: Family Medicine

## 2010-05-19 NOTE — Telephone Encounter (Signed)
meds faxed to [facility]

## 2010-05-31 ENCOUNTER — Other Ambulatory Visit: Payer: Self-pay

## 2010-05-31 MED ORDER — "INSULIN SYRINGE-NEEDLE U-100 31G X 5/16"" 0.5 ML MISC": Status: DC

## 2010-06-29 ENCOUNTER — Other Ambulatory Visit: Payer: Self-pay | Admitting: Family Medicine

## 2010-06-29 LAB — BASIC METABOLIC PANEL
BUN: 25 mg/dL — ABNORMAL HIGH (ref 6–23)
CO2: 24 mEq/L (ref 19–32)
Glucose, Bld: 166 mg/dL — ABNORMAL HIGH (ref 70–99)
Potassium: 4.8 mEq/L (ref 3.5–5.3)
Sodium: 134 mEq/L — ABNORMAL LOW (ref 135–145)

## 2010-07-01 ENCOUNTER — Encounter: Payer: Self-pay | Admitting: Family Medicine

## 2010-07-02 NOTE — H&P (Signed)
NAME:  Donald Everett, Donald Everett NO.:  [id number]   MEDICAL RECORD NO.:  [id number]          PATIENT TYPE:  INP   LOCATION:  A216                          FACILITY:  [facility]   PHYSICIAN:  Skeet Latch, DO    DATE OF BIRTH:  1940/09/20   DATE OF ADMISSION:  05/29/2006  DATE OF DISCHARGE:  LH                              HISTORY & PHYSICAL   PRIMARY CARE PHYSICIAN:  Milus Mallick. Lodema Hong, M.D.   CHIEF COMPLAINT:  Numbness and weakness.   HISTORY OF PRESENT ILLNESS:  This is a 70 year old male who presents to  the [facility] emergency room with complaints of right sided numbness or  weakness.  The patient states that approximately 7 p.m. he began having  numbness and weakness from his lips all the way to his toes on his right  side.  The patient states he was not doing anything unusual at the time  and noticed that the right side of his lips, his right hand and right  lower extremity began to become numb.  The patient states he had trouble  walking and had very limited sensation to his right side.  The onset of  this was acute, and the patient states he has never had this before.  The patient does have a history of hypertension with systolic blood  pressures ranging from the 160's to 180 range.  He states that he checks  his blood pressures at home and also at [facility], and states that when he  checked it yesterday the systolic was in the 170 range.  The patient  states that he checked this in the evening, so it was too late to  contact his primary care physician.  The patient does not have any  history of CVA, but does have CVA in his family.  The patient denied any  headache, chest pain, nausea, vomiting, diarrhea or any abdominal  discomfort.  The patient does have a history of diabetes as well as  hyperlipidemia and peripheral neuropathy.  On my exam, the patient was  symptom free.  The patient was given nitroglycerin as well as Lopressor  in the emergency room, and the patient  has regained all sensation on his  right side and has no lasting effects at this time.   PAST MEDICAL HISTORY:  1. Hypertension.  2. Diabetes.  3. Hyperlipidemia.  4. Peripheral neuropathy.  5. Depression.   PAST SURGICAL HISTORY:  None.   SOCIAL HISTORY:  Denies any drug abuse.  Is a one to two pack per day  smoker for over 50 years and does drink alcohol.   FAMILY HISTORY:  Positive for diabetes and CVA.   ALLERGIES:  No known drug allergies.   REVIEW OF SYSTEMS:  GENERAL:  Denies any fever, weight loss, weakness,  appetite change.  HEENT:  Denies any diplopia, eye pain, ear pain,  hearing loss, hoarseness.  CARDIOVASCULAR:  Denies any chest pain,  palpitations or bradycardia.  RESPIRATORY:  Denies any cough, dyspnea or  wheezing.  GI:  Denies any nausea, vomiting, diarrhea, abdominal pain,  melena. GENITOURINARY:  Denies any dysuria  or urgency.  MUSCULOSKELETAL:  Denies any arthralgias, arthritis, back pain.  NEUROLOGICAL:  Positive  for weakness or numbness, right side.  Denies any headache or altered  mental status.  PSYCHIATRIC:  Positive for some depression, negative for  anxiety.  HEMATOLOGIC:  Denies any anemia or bleeding problems.   PHYSICAL EXAMINATION:  VITAL SIGNS:  Blood pressure 166/73, pulse 68,  saturating 100%, pulse 68.  HEENT:  Normocephalic, atraumatic.  PERRLA.  EOMI.  No thyromegaly.  No  JVD appreciated.  No facial paralysis noted.  Good eye strength.  NECK:  Supple.  CARDIOVASCULAR:  Head regular rate and rhythm.  No rubs, murmurs or  gallops.  RESPIRATORY:  Lungs are clear bilaterally.  No rales, rhonchi or  wheezing.  ABDOMEN:  Soft, nontender, nondistended.  Positive bowel sounds.  EXTREMITIES:  No clubbing, cyanosis or erythema.  NEUROLOGICAL:  Alert and oriented x3.  Cranial nerves II-XII grossly  intact.  The patient did have good strength bilaterally, upper and lower  extremities.  The patient also had good sensation to upper and lower   extremities.  They all seemed intact.  SKIN:  No petechiae.  No rashes noted.   LABORATORY DATA:  White blood cell count 7.0, hemoglobin 11.8,  hematocrit 33.6, platelets 173.  PT 14, INR 1.1, PTT 34.  Sodium 131,  potassium 4.2, chloride 101, CO2 26, glucose 279, BUN 26, creatinine  1.34.  The patient did have a CT of his head which showed no acute  changes, showed some atrophy and small vessel ischemic changes.  The  patient's EKG normal sinus rhythm, first degree AV block, right bundle  branch block.   ASSESSMENT/PLAN:  1. Hypertensive encephalopathy.  The patient will be admitted to      telemetry unit.  The patient will be placed on his home medications      which include Benicar, Enalapril, Norvasc and will add Clonidine at      this time.  The patient was given Lopressor in the emergency room.      Due to his diabetes and elevated blood sugars, will try to stay      away from beta blockers at this time and try Clonidine.  If      Clonidine is unsuccessful, will switch to beta blocker.  The      patient's encephalopathy and paralysis seems to have improved.      Will just keep the patient on oral medications at this time.  2. Insulin-dependent diabetes.  The patient is on glipizide 10 mg      b.i.d.  This will be continued.  The patient will be placed on a      moderate sliding scale and will get a hemoglobin A1C in the a.m.  3. Hyperlipidemia.  The patient will be continued on his home      medications which includes Vytorin, and will get a fasting lipid      panel in the a.m.  4. Hyponatremia.  The patient will placed on IV hydration with normal      saline.  Repeat a BNP in the a.m.      Skeet Latch, DO  Electronically Signed     SM/MEDQ  D:  05/29/2006  T:  05/30/2006  Job:  [id number]   cc:   Milus Mallick. Lodema Hong, M.D.  Fax: [phone]

## 2010-07-02 NOTE — Discharge Summary (Signed)
NAME:  Donald Everett, Donald Everett NO.:  [id number]   MEDICAL RECORD NO.:  [id number]          PATIENT TYPE:  INP   LOCATION:  A216                          FACILITY:  [facility]   PHYSICIAN:  Marcello Moores, MD   DATE OF BIRTH:  1940/06/18   DATE OF ADMISSION:  05/29/2006  DATE OF DISCHARGE:  04/16/2008LH                               DISCHARGE SUMMARY   HOSPITAL COURSE:  Donald Everett is a 70 year old man with history of  hypertension, diabetes, hyperlipidemia, peripheral neuropathy and  depression who presented with numbness of the right side of the body  starting from the lower leg with lower leg weakness for a few hours.  His systolic blood pressure was also on the high range from 160 up to  180 out of the emergency room and in the emergency room.  Otherwise CAT  scan of the brain was normal.  On physical examination, there was no  neurological deficits.  The patient was admitted with assessment of  hypertension.  MRI of the brain was also ordered by Dr. 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  Carotid artery Doppler was also done which shows no  significant stenosis on both sides.  The patient has not had any episode  of numbness, weakness or tingling since he arrived at the emergency room  until he was discharged and he was feeling fine.   DISCHARGE PHYSICAL EXAMINATION:  GENERAL:  The patient was stable,  walking in his room.  VITAL SIGNS:  Blood pressure 150/73, temperature 98 and pulse rate was  57, respiratory rate 20 and saturation was 95.  HEENT:  He has pink conjunctivae, nonicteric sclerae and pupils were  equal and reactive.  NECK:  Supple.  CHEST:  Good air entry bilaterally.  CARDIAC:  S1, S2 regular.  ABDOMEN:  Soft, no area of tenderness.  Normoactive bowel sounds.  EXTREMITIES:  No pedal edema.  Peripheral pulse positive.  NEUROLOGIC:  Alert and well-oriented and there was not any neurological  deficit.   LABORATORY DATA AND X-RAY FINDINGS:  White blood cells 9, hemoglobin  11.5, hematocrit 32.7, platelet count 186.  Chemistries within normal  range.   Echocardiogram was also done which shows no significant abnormality  except mild left atrial enlargement.   DISCHARGE DIAGNOSES:  1. Numbness and weakness of right side of body which resolved after a      few hours by itself without any significant CT scan, MRI or      physical examination finding.  This is more likely transient      ischemic attacks, but it is not easy to differentiate whether it      was hypertensive or embolic type.  2. Hypertension, fairly controlled.  3. Diabetes mellitus, fairly controlled with insulin,  4. Hyperlipidemia.   DISCHARGE MEDICATIONS:  He was sent home with his home medications.  Currently, while I am dictating, the chart is not in front of me.  On  top of his medications, aspirin enteric coated  325 mg p.o. daily was  added by Dr. Gerilyn Pilgrim and prescription was given for that.   FOLLOW UP:  He was discharged to have followup with Dr. Lodema Hong and I  contacted Dr. Anthony Sar office for his further followup and management  as well.      Marcello Moores, MD  Electronically Signed     MT/MEDQ  D:  06/01/2006  T:  06/02/2006  Job:  [id number]

## 2010-07-02 NOTE — Procedures (Signed)
NAME:  Donald Everett, Donald Everett NO.:  [id number]   MEDICAL RECORD NO.:  [id number]          PATIENT TYPE:  INP   LOCATION:  A216                          FACILITY:  [facility]   PHYSICIAN:  Gerrit Friends. 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  M-mode aorta 3.2, left atrium 4.8, septum  1.5, posterior wall 1.4, LV diastole 4.2, LV systole 3.2.  1. Technically adequate echocardiographic study.  2. Mild left atrial enlargement; normal right atrium and right      ventricle.  3. Mild sclerosis of the aortic valve; normal valve function.  4. Normal mitral, tricuspid and pulmonic valve; normal proximal      pulmonary artery.  5. Normal left ventricular size; mild concentric hypertrophy; normal      regional and global function.      Gerrit Friends. Dietrich Pates, MD, [facility]  Electronically Signed     RMR/MEDQ  D:  05/30/2006  T:  05/31/2006  Job:  [id number]

## 2010-07-07 ENCOUNTER — Encounter: Payer: Self-pay | Admitting: Family Medicine

## 2010-07-07 ENCOUNTER — Ambulatory Visit (INDEPENDENT_AMBULATORY_CARE_PROVIDER_SITE_OTHER): Payer: Medicare Other | Admitting: Family Medicine

## 2010-07-07 VITALS — BP 128/60 | HR 66 | Resp 16 | Ht 70.0 in | Wt 165.0 lb

## 2010-07-07 DIAGNOSIS — E785 Hyperlipidemia, unspecified: Secondary | ICD-10-CM

## 2010-07-07 DIAGNOSIS — R5381 Other malaise: Secondary | ICD-10-CM

## 2010-07-07 DIAGNOSIS — E109 Type 1 diabetes mellitus without complications: Secondary | ICD-10-CM

## 2010-07-07 DIAGNOSIS — I10 Essential (primary) hypertension: Secondary | ICD-10-CM

## 2010-07-07 DIAGNOSIS — R5383 Other fatigue: Secondary | ICD-10-CM

## 2010-07-07 DIAGNOSIS — F17200 Nicotine dependence, unspecified, uncomplicated: Secondary | ICD-10-CM

## 2010-07-07 DIAGNOSIS — J449 Chronic obstructive pulmonary disease, unspecified: Secondary | ICD-10-CM | POA: Insufficient documentation

## 2010-07-07 DIAGNOSIS — F411 Generalized anxiety disorder: Secondary | ICD-10-CM

## 2010-07-07 MED ORDER — TIOTROPIUM BROMIDE MONOHYDRATE 18 MCG IN CAPS: 18.0000 ug | ORAL_CAPSULE | Freq: Every day | RESPIRATORY_TRACT | Status: DC

## 2010-07-07 MED ORDER — GABAPENTIN 300 MG PO CAPS: 300.0000 mg | ORAL_CAPSULE | Freq: Three times a day (TID) | ORAL | Status: DC

## 2010-07-07 MED ORDER — CYANOCOBALAMIN 1000 MCG/ML IJ SOLN
1000.0000 ug | Freq: Once | INTRAMUSCULAR | Status: AC
  Administered 2010-07-07: 1000 ug via INTRAMUSCULAR

## 2010-07-07 MED ORDER — ALPRAZOLAM 0.5 MG PO TABS: 0.5000 mg | ORAL_TABLET | Freq: Every evening | ORAL | Status: DC | PRN

## 2010-07-07 MED ORDER — HYDROCODONE-ACETAMINOPHEN 7.5-750 MG PO TABS: 1.0000 | ORAL_TABLET | Freq: Two times a day (BID) | ORAL | Status: DC

## 2010-07-07 MED ORDER — ZOLPIDEM TARTRATE 10 MG PO TABS: 10.0000 mg | ORAL_TABLET | Freq: Every evening | ORAL | Status: DC | PRN

## 2010-07-07 NOTE — Patient Instructions (Signed)
F/U in 3 months.  Pls cut back by one cigarette each 2 to 4 weeks.  You are being referred for overnight pulse oximetry to see if you need oxygen at night.  You will start a new inhaler for your breathing, continue the advair, call if you still have a lot of fatigue, I will refer  You to a specialist in [facility]  Your pressure is good and your sugar has improved, GREAT.  B12 injection today.  3 months, fasting labs

## 2010-07-07 NOTE — Assessment & Plan Note (Signed)
Deteriorated, currently smoking 1PPD, plans to cut back by 1 cigarette each month

## 2010-07-08 LAB — MICROALBUMIN / CREATININE URINE RATIO
Creatinine, Urine: 86.8 mg/dL
Microalb Creat Ratio: 44.8 mg/g — ABNORMAL HIGH (ref 0.0–30.0)
Microalb, Ur: 3.89 mg/dL — ABNORMAL HIGH (ref 0.00–1.89)

## 2010-07-12 ENCOUNTER — Encounter: Payer: Self-pay | Admitting: Family Medicine

## 2010-07-12 DIAGNOSIS — R5383 Other fatigue: Secondary | ICD-10-CM | POA: Insufficient documentation

## 2010-07-12 NOTE — Assessment & Plan Note (Signed)
Unchanged, increased associated complications, encouraged to quit

## 2010-07-12 NOTE — Assessment & Plan Note (Signed)
Low fat diet discussed no med change

## 2010-07-12 NOTE — Assessment & Plan Note (Signed)
Controlled on meds

## 2010-07-12 NOTE — Assessment & Plan Note (Signed)
Improved, no med change 

## 2010-07-12 NOTE — Progress Notes (Signed)
  Subjective:    Patient ID: Donald Everett, male    DOB: 02/27/1940, 70 y.o.   MRN: [id number]  HPI The PT is here for follow up and re-evaluation of chronic medical conditions, medication management and review of recent lab and radiology data.  Preventive health is updated, specifically  Cancer screening, and Immunization.   Questions or concerns regarding consultations or procedures which the PT has had in the interim are  addressed. The PT denies any adverse reactions to current medications since the last visit.  C/o increased fatigue with activity, recently had a cardiac eval which was negative for disease. Nicotine use is unchanged since last visit, he rccognizes this is a real problem and wants to quit, though finds it diffcult HYPERTENSION Disease Monitoring Blood pressure range-130/70 Chest pain- no      Dyspnea- yes Medications Compliance- good Lightheadedness- no   Edema- no   DIABETES Disease Monitoring Blood Sugar ranges-fasting ranges from 130 to 170 Polyuria- no New Visual problems- reports poor vision Medications Compliance-goodHypoglycemic symptoms- occasinally   HYPERLIPIDEMIA Disease Monitoring See symptoms for Hypertension Medications Compliance- good RUQ pain- no  Muscle aches- no  ROS See HPI above   PMH Smoking Status noted       Review of Systems Denies recent fever or chills. Denies sinus pressure, nasal congestion, ear pain or sore throat. Denies chest congestion, productive cough or wheezing.Progressive exertional dyspnea, and ongoing nicotine use Denies chest pains, palpitations, paroxysmal nocturnal dyspnea, orthopnea and leg swelling Denies abdominal pain, nausea, vomiting,diarrhea or constipation.  Denies rectal bleeding or change in bowel movement. Denies dysuria, frequency, hesitancy or incontinence. Chronic back and lower extremity pain with numbness  Denies headaches, seizure, numbness, or tingling. Denies uncontrolled  Depression or   anxiety or insomnia. Denies skin break down or rash.        Objective:   Physical Exam Patient alert and oriented and in no Cardiopulmonary distress.  HEENT: No facial asymmetry, EOMI, no sinus tenderness, TM's clear, Oropharynx pink and moist.  Neck supple no adenopathy.  Chest: Clear to auscultation bilaterally.Decreased air entry throughout   CVS: S1, S2 no murmurs, no S3.  ABD: Soft non tender. Bowel sounds normal.  Ext: No edema  MS: decreased  ROM spine,adequate in  shoulders, hips and knees.  Skin: Intact, no ulcerations or rash noted.  Psych: Good eye contact, normal affect. Memory intact not anxious or depressed appearing.  CNS: CN 2-12 intact, power, tone  normal throughout.decreased sensation in feet        Assessment & Plan:

## 2010-07-12 NOTE — Assessment & Plan Note (Signed)
Deteriorated, h/o response to B12 injection, currently low normal , will administer same

## 2010-07-14 ENCOUNTER — Telehealth: Payer: Self-pay | Admitting: Family Medicine

## 2010-07-14 ENCOUNTER — Other Ambulatory Visit: Payer: Self-pay

## 2010-07-14 MED ORDER — ALPRAZOLAM 0.5 MG PO TABS: 0.5000 mg | ORAL_TABLET | Freq: Every evening | ORAL | Status: DC | PRN

## 2010-07-14 NOTE — Telephone Encounter (Signed)
Patient aware, med sent to [facility]

## 2010-07-19 ENCOUNTER — Encounter: Payer: Self-pay | Admitting: Family Medicine

## 2010-07-19 ENCOUNTER — Other Ambulatory Visit: Payer: Self-pay | Admitting: Family Medicine

## 2010-07-20 NOTE — Telephone Encounter (Signed)
[employer] called him and said that his insurance wasn't covering the xanax and to send in an alternative. Told him I would call them because I haven't received anything from them about it

## 2010-07-21 NOTE — Telephone Encounter (Signed)
Called [employer] and they said the xanax was coming up as "rejected" they want to know if you want to try an alternative?

## 2010-07-21 NOTE — Telephone Encounter (Signed)
Try klonopin 0.5mg  one at bedtime please instead

## 2010-07-23 MED ORDER — CLONAZEPAM 0.5 MG PO TABS: 0.5000 mg | ORAL_TABLET | Freq: Every evening | ORAL | Status: DC | PRN

## 2010-07-23 NOTE — Telephone Encounter (Signed)
Patient aware and med sent in  

## 2010-07-23 NOTE — Telephone Encounter (Signed)
Addended by: Abner Greenspan on: 07/23/2010 01:16 PM   Modules accepted: Orders, Medications

## 2010-08-03 ENCOUNTER — Telehealth: Payer: Self-pay | Admitting: Family Medicine

## 2010-08-03 MED ORDER — ALPRAZOLAM 0.5 MG PO TABS: 0.5000 mg | ORAL_TABLET | Freq: Every day | ORAL | Status: DC

## 2010-08-03 NOTE — Telephone Encounter (Signed)
Med sent as requested 

## 2010-08-19 ENCOUNTER — Other Ambulatory Visit: Payer: Self-pay | Admitting: Family Medicine

## 2010-09-10 ENCOUNTER — Telehealth: Payer: Self-pay | Admitting: *Deleted

## 2010-09-10 NOTE — Telephone Encounter (Signed)
Called patient, left detailed voicemail

## 2010-09-10 NOTE — Telephone Encounter (Signed)
He aabsolutely needs to go to Ed, his sugars are not generally this high, most likely he is dehydrated, and may get confused and even more sick, if he does not go, he NEEDS to go, plsss let him know

## 2010-09-13 NOTE — Telephone Encounter (Signed)
Spoke with patient, states sugar is not as high and has appt in Aug. Will call if needs appt sooner

## 2010-10-04 ENCOUNTER — Other Ambulatory Visit: Payer: Self-pay | Admitting: Family Medicine

## 2010-10-05 LAB — COMPLETE METABOLIC PANEL WITH GFR
Albumin: 4 g/dL (ref 3.5–5.2)
BUN: 21 mg/dL (ref 6–23)
CO2: 25 mEq/L (ref 19–32)
Calcium: 9.3 mg/dL (ref 8.4–10.5)
GFR, Est African American: >60 mL/min (ref >60)
GFR, Est Non African American: >60 mL/min (ref >60)
Glucose, Bld: 178 mg/dL — ABNORMAL HIGH (ref 70–99)
Potassium: 5.1 mEq/L (ref 3.5–5.3)
Sodium: 130 mEq/L — ABNORMAL LOW (ref 135–145)
Total Protein: 6.4 g/dL (ref 6.0–8.3)

## 2010-10-05 LAB — HEMOGLOBIN A1C
Hgb A1c MFr Bld: 8.4 % — ABNORMAL HIGH (ref <5.7)
Mean Plasma Glucose: 194 mg/dL — ABNORMAL HIGH (ref <117)

## 2010-10-05 LAB — LIPID PANEL: Cholesterol: 124 mg/dL (ref 0–200)

## 2010-10-06 ENCOUNTER — Encounter: Payer: Self-pay | Admitting: Family Medicine

## 2010-10-07 ENCOUNTER — Encounter: Payer: Self-pay | Admitting: Family Medicine

## 2010-10-07 ENCOUNTER — Ambulatory Visit (INDEPENDENT_AMBULATORY_CARE_PROVIDER_SITE_OTHER): Payer: Medicare Other | Admitting: Family Medicine

## 2010-10-07 VITALS — BP 120/60 | HR 75 | Resp 16 | Ht 70.0 in | Wt 169.4 lb

## 2010-10-07 DIAGNOSIS — E785 Hyperlipidemia, unspecified: Secondary | ICD-10-CM

## 2010-10-07 DIAGNOSIS — J449 Chronic obstructive pulmonary disease, unspecified: Secondary | ICD-10-CM

## 2010-10-07 DIAGNOSIS — R5383 Other fatigue: Secondary | ICD-10-CM

## 2010-10-07 DIAGNOSIS — E109 Type 1 diabetes mellitus without complications: Secondary | ICD-10-CM

## 2010-10-07 DIAGNOSIS — E119 Type 2 diabetes mellitus without complications: Secondary | ICD-10-CM

## 2010-10-07 DIAGNOSIS — F17200 Nicotine dependence, unspecified, uncomplicated: Secondary | ICD-10-CM

## 2010-10-07 DIAGNOSIS — K5900 Constipation, unspecified: Secondary | ICD-10-CM | POA: Insufficient documentation

## 2010-10-07 DIAGNOSIS — E538 Deficiency of other specified B group vitamins: Secondary | ICD-10-CM

## 2010-10-07 DIAGNOSIS — I10 Essential (primary) hypertension: Secondary | ICD-10-CM

## 2010-10-07 DIAGNOSIS — Z23 Encounter for immunization: Secondary | ICD-10-CM

## 2010-10-07 DIAGNOSIS — IMO0001 Reserved for inherently not codable concepts without codable children: Secondary | ICD-10-CM

## 2010-10-07 DIAGNOSIS — R5381 Other malaise: Secondary | ICD-10-CM

## 2010-10-07 DIAGNOSIS — Z2911 Encounter for prophylactic immunotherapy for respiratory syncytial virus (RSV): Secondary | ICD-10-CM

## 2010-10-07 MED ORDER — POLYETHYLENE GLYCOL 3350 17 GM/SCOOP PO POWD: ORAL | Status: DC

## 2010-10-07 MED ORDER — CYANOCOBALAMIN 1000 MCG/ML IJ SOLN
1000.0000 ug | Freq: Once | INTRAMUSCULAR | Status: AC
  Administered 2010-10-07: 1000 ug via INTRAMUSCULAR

## 2010-10-07 NOTE — Patient Instructions (Addendum)
F/u in 6 weeks.  Nurse visit every 4 weeks for B12   Pls limit water/fluid intake to 64 ounces daily and add a little salt to your food, your sodium is low.  Use the med as written to help your bowqels.  You need a colonscopy  Non fasting labs in 3 months

## 2010-10-10 NOTE — Assessment & Plan Note (Signed)
Deteriorated, states he has been taking in a lot of calories in the form of gatorade etc

## 2010-10-10 NOTE — Assessment & Plan Note (Signed)
Has cut back , no commitment to quitting at this time

## 2010-10-10 NOTE — Assessment & Plan Note (Signed)
Continues to deteriorate, though pt in denial , unwilling to reduce nicotine use currently

## 2010-10-10 NOTE — Assessment & Plan Note (Signed)
Recent change in bowel pattern noted, pt still ressits colonscopy, which he has done for years

## 2010-10-10 NOTE — Progress Notes (Signed)
  Subjective:    Patient ID: Donald Everett, male    DOB: [birth year], 70 y.o.   MRN: [id number]  HPI The PT is here for follow up and re-evaluation of chronic medical conditions, medication management and review of any available recent lab and radiology data.  Preventive health is updated, specifically  Cancer screening and Immunization.   Questions or concerns regarding consultations or procedures which the PT has had in the interim are  addressed. The PT denies any adverse reactions to current medications since the last visit.  Pt recently had a blood sugar of over 500, and states that it was [place] that he was advised to go to the Ed for eval, and that a call was made 3 days later to see how he was doing.States his blood sugars have recently been elevated because of drinking excessive calories eg gatorade. C/o recent change in bowel movements, with development of constipation and change in stool character in the last 3 to 4 months, still refusing colonoscopy, although I have made it clear that this could  Be a warning sign for colon cancer. States he will call back     Review of Systems Denies recent fever or chills. Denies sinus pressure, nasal congestion, ear pain or sore throat. Denies chest congestion, productive cough or wheezing. Denies chest pains, palpitations and leg swelling Denies abdominal pain, nausea, vomiting,diarrhea  Denies dysuria, frequency, hesitancy or incontinence. Chronic back pain  Numbness, and tingling in lower extremities, worsening Denies depression, increased  anxiety  And insomnia about health Denies skin break down or rash.        Objective:   Physical Exam  Patient alert and oriented and in no cardiopulmonary distress.  HEENT: No facial asymmetry, EOMI, no sinus tenderness,  oropharynx pink and moist.  Neck supple no adenopathy.  Chest: Clear to auscultation bilaterally.Decreased air entry throughout  CVS: S1, S2 no murmurs, no S3.  ABD:  Soft non tender. Bowel sounds normal.  Ext: No edema  MS: decreased ROM spine,adequate in  shoulders, hips and knees.  Skin: Intact, no ulcerations or rash noted.  Psych: Good eye contact, normal affect. Memory intact not anxious or depressed appearing.  CNS: CN 2-12 grossly intact,hearing loss, sensation decreased in lower extremities      Assessment & Plan:

## 2010-10-20 NOTE — Progress Notes (Signed)
  Subjective:    Patient ID: Donald Everett, male    DOB: February 27, 1940, 70 y.o.   MRN: [id number]  HPI    Review of Systems     Objective:   Physical Exam Diabetic Foot Check:  Appearance - no lesions, ulcers or calluses Skin - no unusual pallor or redness Sensation - grossly intact to light touch Monofilament testing -  Right - Great toe, medial, central, lateral ball and posterior foot diminished Left - Great toe, medial, central, lateral ball and posterior foot diminished Pulses Left - Dorsalis Pedis and Posterior Tibia normal Right - Dorsalis Pedis and Posterior Tibia normal        Assessment & Plan:

## 2010-10-29 ENCOUNTER — Other Ambulatory Visit: Payer: Self-pay

## 2010-10-29 MED ORDER — OMEPRAZOLE 40 MG PO CPDR: DELAYED_RELEASE_CAPSULE | ORAL | Status: DC

## 2010-11-05 ENCOUNTER — Ambulatory Visit (INDEPENDENT_AMBULATORY_CARE_PROVIDER_SITE_OTHER): Payer: Medicare Other

## 2010-11-05 VITALS — BP 130/70 | Wt 166.4 lb

## 2010-11-05 DIAGNOSIS — E538 Deficiency of other specified B group vitamins: Secondary | ICD-10-CM

## 2010-11-05 DIAGNOSIS — Z23 Encounter for immunization: Secondary | ICD-10-CM

## 2010-11-05 NOTE — Progress Notes (Signed)
  Subjective:    Patient ID: Donald Everett, male    DOB: 19-Nov-1940, 70 y.o.   MRN: [id number]  HPI    Review of Systems     Objective:   Physical Exam        Assessment & Plan:  Pt in for monthly B12 injection and influenza vaccine. Both administered with no adverse effects

## 2010-11-08 ENCOUNTER — Ambulatory Visit: Payer: Medicare Other

## 2010-11-08 ENCOUNTER — Encounter: Payer: Self-pay | Admitting: Family Medicine

## 2010-11-08 DIAGNOSIS — Z23 Encounter for immunization: Secondary | ICD-10-CM

## 2010-11-08 MED ORDER — CYANOCOBALAMIN 1000 MCG/ML IJ SOLN
1000.0000 ug | Freq: Once | INTRAMUSCULAR | Status: AC
  Administered 2010-11-08: 1000 ug via INTRAMUSCULAR

## 2010-11-08 NOTE — Progress Notes (Signed)
B12 and flu given with no complications

## 2010-11-08 NOTE — Progress Notes (Signed)
  Subjective:    Patient ID: Donald Everett, male    DOB: [birth year], 70 y.o.   MRN: [id number]  HPI    Review of Systems     Objective:   Physical Exam        Assessment & Plan:

## 2010-11-11 ENCOUNTER — Other Ambulatory Visit: Payer: Self-pay

## 2010-11-11 MED ORDER — ALPRAZOLAM 0.5 MG PO TABS: 0.5000 mg | ORAL_TABLET | Freq: Every day | ORAL | Status: DC

## 2010-12-14 ENCOUNTER — Ambulatory Visit (INDEPENDENT_AMBULATORY_CARE_PROVIDER_SITE_OTHER): Payer: Medicare Other

## 2010-12-14 VITALS — BP 148/64 | Wt 176.4 lb

## 2010-12-14 DIAGNOSIS — E538 Deficiency of other specified B group vitamins: Secondary | ICD-10-CM

## 2010-12-14 MED ORDER — CYANOCOBALAMIN 1000 MCG/ML IJ SOLN
1000.0000 ug | Freq: Once | INTRAMUSCULAR | Status: AC
  Administered 2010-12-14: 1000 ug via INTRAMUSCULAR

## 2010-12-14 NOTE — Progress Notes (Signed)
Received injection with no complications  

## 2011-01-03 ENCOUNTER — Encounter: Payer: Self-pay | Admitting: Family Medicine

## 2011-01-10 LAB — BASIC METABOLIC PANEL
CO2: 26 mEq/L (ref 19–32)
Chloride: 102 mEq/L (ref 96–112)
Creat: 1.15 mg/dL (ref 0.50–1.35)
Glucose, Bld: 86 mg/dL (ref 70–99)

## 2011-01-11 ENCOUNTER — Encounter: Payer: Self-pay | Admitting: Family Medicine

## 2011-01-11 ENCOUNTER — Ambulatory Visit (INDEPENDENT_AMBULATORY_CARE_PROVIDER_SITE_OTHER): Payer: Medicare Other | Admitting: Family Medicine

## 2011-01-11 VITALS — BP 152/70 | HR 67 | Resp 16 | Ht 70.0 in | Wt 171.0 lb

## 2011-01-11 DIAGNOSIS — I10 Essential (primary) hypertension: Secondary | ICD-10-CM

## 2011-01-11 DIAGNOSIS — E109 Type 1 diabetes mellitus without complications: Secondary | ICD-10-CM

## 2011-01-11 DIAGNOSIS — E785 Hyperlipidemia, unspecified: Secondary | ICD-10-CM

## 2011-01-11 DIAGNOSIS — F411 Generalized anxiety disorder: Secondary | ICD-10-CM

## 2011-01-11 DIAGNOSIS — M545 Low back pain, unspecified: Secondary | ICD-10-CM

## 2011-01-11 DIAGNOSIS — F17200 Nicotine dependence, unspecified, uncomplicated: Secondary | ICD-10-CM

## 2011-01-11 DIAGNOSIS — H906 Mixed conductive and sensorineural hearing loss, bilateral: Secondary | ICD-10-CM

## 2011-01-11 DIAGNOSIS — J449 Chronic obstructive pulmonary disease, unspecified: Secondary | ICD-10-CM

## 2011-01-11 DIAGNOSIS — Z125 Encounter for screening for malignant neoplasm of prostate: Secondary | ICD-10-CM

## 2011-01-11 DIAGNOSIS — R5381 Other malaise: Secondary | ICD-10-CM

## 2011-01-11 DIAGNOSIS — E538 Deficiency of other specified B group vitamins: Secondary | ICD-10-CM

## 2011-01-11 DIAGNOSIS — R5383 Other fatigue: Secondary | ICD-10-CM

## 2011-01-11 LAB — HEMOGLOBIN A1C: Mean Plasma Glucose: 169 mg/dL — ABNORMAL HIGH (ref <117)

## 2011-01-11 MED ORDER — PRAVASTATIN SODIUM 40 MG PO TABS: 40.0000 mg | ORAL_TABLET | Freq: Every day | ORAL | Status: DC

## 2011-01-11 MED ORDER — CYANOCOBALAMIN 1000 MCG/ML IJ SOLN
1000.0000 ug | Freq: Once | INTRAMUSCULAR | Status: AC
  Administered 2011-01-11: 1000 ug via INTRAMUSCULAR

## 2011-01-11 MED ORDER — CLONIDINE HCL 0.1 MG PO TABS: ORAL_TABLET | ORAL | Status: DC

## 2011-01-11 MED ORDER — CITALOPRAM HYDROBROMIDE 40 MG PO TABS: 40.0000 mg | ORAL_TABLET | Freq: Every day | ORAL | Status: DC

## 2011-01-11 MED ORDER — HYDROCODONE-ACETAMINOPHEN 7.5-750 MG PO TABS: 1.0000 | ORAL_TABLET | Freq: Two times a day (BID) | ORAL | Status: DC

## 2011-01-11 MED ORDER — ALPRAZOLAM 0.5 MG PO TABS: 0.5000 mg | ORAL_TABLET | Freq: Every day | ORAL | Status: DC

## 2011-01-11 MED ORDER — LOSARTAN POTASSIUM 100 MG PO TABS: 100.0000 mg | ORAL_TABLET | Freq: Every day | ORAL | Status: DC

## 2011-01-11 MED ORDER — TAMSULOSIN HCL 0.4 MG PO CAPS: 0.4000 mg | ORAL_CAPSULE | Freq: Every day | ORAL | Status: DC

## 2011-01-11 NOTE — Progress Notes (Signed)
  Subjective:    Patient ID: Donald Everett, male    DOB: May 19, 1940, 70 y.o.   MRN: [id number]  HPI The PT is here for follow up and re-evaluation of chronic medical conditions, medication management and review of any available recent lab and radiology data.  Preventive health is updated, specifically  Cancer screening and Immunization.   Questions or concerns regarding consultations or procedures which the PT has had in the interim are  addressed. The PT denies any adverse reactions to current medications since the last visit.  There are no new concerns.  Continues to experience chronic back and lower extremity pain. Also reports fluctuations in blood sugar, has had no hypoglycemic episodes, varies his insulin dosing and is on no standard dose    Review of Systems    See HPI Denies recent fever or chills. Denies sinus pressure, nasal congestion, ear pain or sore throat. Denies chest congestion, productive cough or wheezing.Has exertional fatigue , continues to smoke excessively , no plan to quit at this time Denies chest pains, palpitations and leg swelling Denies abdominal pain, nausea, vomiting,diarrhea or constipation.   Denies dysuria, frequency, hesitancy or incontinence.  Denies headaches, seizures, numbness, or tingling. Denies uncontrolled  depression, anxiety or insomnia.Takes medication for this Denies skin break down or rash.     Objective:   Physical Exam Patient alert and oriented and in no cardiopulmonary distress.  HEENT: No facial asymmetry, EOMI, no sinus tenderness,  oropharynx pink and moist.  Neck supple no adenopathy.  Chest: Clear to auscultation bilaterally.Decreased air entry throughoout  CVS: S1, S2 no murmurs, no S3.  ABD: Soft non tender. Bowel sounds normal.  Ext: No edema  MS: Decreased  ROM spine,adequate in  shoulders, hips and knees.  Skin: Intact, no ulcerations or rash noted.  Psych: Good eye contact, normal affect. Memory  intact  not anxious or depressed appearing.  CNS: CN 2-12 intact, power,  normal throughout. Diabetic Foot Check:  Appearance - no lesions, ulcers or calluses Skin - no unusual pallor or redness Sensation - grossly intact to light touch Monofilament testing -  Right - Great toe, medial, central, lateral ball and posterior foot diminished Left - Great toe, medial, central, lateral ball and posterior foot diminished Pulses Left - Dorsalis Pedis and Posterior Tibia normal Right - Dorsalis Pedis and Posterior Tibia normal         Assessment & Plan:

## 2011-01-11 NOTE — Patient Instructions (Addendum)
cONGRATS on markedly improved blood sugars, great, keep it up!  Nurse visit for BP check first week in January.  MD visit in 3.5 months.  Fasting labs before MD visit  Please start taking novolog regularly at breakfast and at suppertime every day.35 units at breakfast, and 20 units at supper  New additional medication, clonidine one at bedtime for blood pressure. Stop zolpidem since this has a sleepy side effect, If you need a sleep aid take half not a whole zolpidem tablet   You will be referred for hearing evaluation in January as discussed  Continue the monthly vit B shots

## 2011-01-11 NOTE — Assessment & Plan Note (Signed)
Marked improvement, pt to take standard doses of novolog moving forward

## 2011-01-11 NOTE — Assessment & Plan Note (Signed)
Worsening ENT eval in Jan

## 2011-01-11 NOTE — Assessment & Plan Note (Signed)
Uncontrolled, add clonidine 0.1mg  at bedtime, nurse visit in 6 weeks. Pt to hold on zolpidem/break in half while on the med , he is advised.

## 2011-01-16 NOTE — Assessment & Plan Note (Signed)
Progressively worsening , needs to quit nicotine

## 2011-01-16 NOTE — Assessment & Plan Note (Signed)
Controlled on current meds continue same 

## 2011-01-16 NOTE — Assessment & Plan Note (Signed)
Unchanged, continue med as before 

## 2011-01-16 NOTE — Assessment & Plan Note (Signed)
Unchanged , cessation counseling done 

## 2011-02-16 ENCOUNTER — Ambulatory Visit (INDEPENDENT_AMBULATORY_CARE_PROVIDER_SITE_OTHER): Payer: Medicare Other

## 2011-02-16 VITALS — BP 144/70 | Wt 173.0 lb

## 2011-02-16 DIAGNOSIS — E538 Deficiency of other specified B group vitamins: Secondary | ICD-10-CM

## 2011-02-16 MED ORDER — CYANOCOBALAMIN 1000 MCG/ML IJ SOLN
1000.0000 ug | Freq: Once | INTRAMUSCULAR | Status: AC
  Administered 2011-02-16: 1000 ug via INTRAMUSCULAR

## 2011-02-16 NOTE — Progress Notes (Signed)
Received injection with no complications  

## 2011-02-18 ENCOUNTER — Telehealth: Payer: Self-pay

## 2011-02-18 ENCOUNTER — Other Ambulatory Visit: Payer: Self-pay

## 2011-02-18 DIAGNOSIS — I10 Essential (primary) hypertension: Secondary | ICD-10-CM

## 2011-02-18 DIAGNOSIS — E109 Type 1 diabetes mellitus without complications: Secondary | ICD-10-CM

## 2011-02-18 DIAGNOSIS — J449 Chronic obstructive pulmonary disease, unspecified: Secondary | ICD-10-CM

## 2011-02-18 MED ORDER — CITALOPRAM HYDROBROMIDE 40 MG PO TABS: 40.0000 mg | ORAL_TABLET | Freq: Every day | ORAL | Status: DC

## 2011-02-18 MED ORDER — INSULIN ASPART PROT & ASPART (70-30 MIX) 100 UNIT/ML ~~LOC~~ SUSP: SUBCUTANEOUS | Status: DC

## 2011-02-18 MED ORDER — TAMSULOSIN HCL 0.4 MG PO CAPS: 0.4000 mg | ORAL_CAPSULE | Freq: Every day | ORAL | Status: DC

## 2011-02-18 MED ORDER — PRAVASTATIN SODIUM 40 MG PO TABS: 40.0000 mg | ORAL_TABLET | Freq: Every day | ORAL | Status: DC

## 2011-02-18 MED ORDER — POLYETHYLENE GLYCOL 3350 17 GM/SCOOP PO POWD: ORAL | Status: DC

## 2011-02-18 MED ORDER — TIOTROPIUM BROMIDE MONOHYDRATE 18 MCG IN CAPS: 18.0000 ug | ORAL_CAPSULE | Freq: Every day | RESPIRATORY_TRACT | Status: DC

## 2011-02-18 MED ORDER — LOSARTAN POTASSIUM 100 MG PO TABS: 100.0000 mg | ORAL_TABLET | Freq: Every day | ORAL | Status: DC

## 2011-02-18 MED ORDER — GABAPENTIN 300 MG PO CAPS: ORAL_CAPSULE | ORAL | Status: DC

## 2011-02-18 MED ORDER — OMEPRAZOLE 40 MG PO CPDR: DELAYED_RELEASE_CAPSULE | ORAL | Status: DC

## 2011-02-18 MED ORDER — CLONIDINE HCL 0.1 MG PO TABS: ORAL_TABLET | ORAL | Status: DC

## 2011-02-18 NOTE — Telephone Encounter (Signed)
Called in and stated last few times he's been here his BP has been elevated (systolic) and he has not been feeling well lately. Thinks he needs more BP meds. Also he stated he had a cold and wanted some penicillin called in. Advised he would need to make appt for the BP and antibiotic and he was transferred to [facility] but hung up before she answered

## 2011-02-24 ENCOUNTER — Ambulatory Visit (INDEPENDENT_AMBULATORY_CARE_PROVIDER_SITE_OTHER): Payer: Medicare Other | Admitting: Otolaryngology

## 2011-02-24 ENCOUNTER — Other Ambulatory Visit: Payer: Self-pay

## 2011-02-24 DIAGNOSIS — H903 Sensorineural hearing loss, bilateral: Secondary | ICD-10-CM

## 2011-02-25 ENCOUNTER — Encounter: Payer: Self-pay | Admitting: Family Medicine

## 2011-02-25 ENCOUNTER — Ambulatory Visit (INDEPENDENT_AMBULATORY_CARE_PROVIDER_SITE_OTHER): Payer: Medicare Other | Admitting: Family Medicine

## 2011-02-25 VITALS — BP 140/68 | HR 71 | Resp 18 | Ht 70.0 in | Wt 173.1 lb

## 2011-02-25 DIAGNOSIS — F3289 Other specified depressive episodes: Secondary | ICD-10-CM

## 2011-02-25 DIAGNOSIS — F32A Depression, unspecified: Secondary | ICD-10-CM | POA: Insufficient documentation

## 2011-02-25 DIAGNOSIS — I10 Essential (primary) hypertension: Secondary | ICD-10-CM

## 2011-02-25 DIAGNOSIS — F329 Major depressive disorder, single episode, unspecified: Secondary | ICD-10-CM

## 2011-02-25 DIAGNOSIS — M545 Low back pain, unspecified: Secondary | ICD-10-CM

## 2011-02-25 DIAGNOSIS — E109 Type 1 diabetes mellitus without complications: Secondary | ICD-10-CM

## 2011-02-25 DIAGNOSIS — J209 Acute bronchitis, unspecified: Secondary | ICD-10-CM

## 2011-02-25 DIAGNOSIS — F17200 Nicotine dependence, unspecified, uncomplicated: Secondary | ICD-10-CM

## 2011-02-25 MED ORDER — BENZONATATE 100 MG PO CAPS: 100.0000 mg | ORAL_CAPSULE | Freq: Four times a day (QID) | ORAL | Status: DC | PRN

## 2011-02-25 MED ORDER — AMPICILLIN 500 MG PO CAPS: 500.0000 mg | ORAL_CAPSULE | Freq: Three times a day (TID) | ORAL | Status: AC

## 2011-02-25 MED ORDER — CITALOPRAM HYDROBROMIDE 20 MG PO TABS: 20.0000 mg | ORAL_TABLET | Freq: Every day | ORAL | Status: DC

## 2011-02-25 MED ORDER — CEFTRIAXONE SODIUM 1 G IJ SOLR
500.0000 mg | Freq: Once | INTRAMUSCULAR | Status: AC
  Administered 2011-02-25: 500 mg via INTRAMUSCULAR

## 2011-02-25 NOTE — Patient Instructions (Signed)
F/u as before.  Fasting labs before visit.   Your blood pressure is good , no change in med.   Yoou will get Rocephin 500mg  iM in the office for bronchitis

## 2011-02-27 NOTE — Progress Notes (Signed)
  Subjective:    Patient ID: Donald Everett, male    DOB: 14-Feb-1941, 71 y.o.   MRN: [id number]  HPI C/o head and chest congestion with chills x 2 weeks. Still has a significant of cough with thick yellow sputum. C/o left supraorbital headache intermittently x 2 weeks. No blurred vision or lateralizing signs, pin worse with excessive cough . Reports increased mid and low back pain during the time he has been ill. Fluctuating with elevated blood pressures when he has checked it at home concerned that this is causing the headache   Review of Systems See HPI Denies recent fever or chills. Denies chest pains, palpitations and leg swelling Denies abdominal pain, nausea, vomiting,diarrhea or constipation.   Denies dysuria, frequency, hesitancy or incontinence.  Denies depression, anxiety or insomnia. Denies skin break down or rash.        Objective:   Physical Exam Patient alert and oriented and in no cardiopulmonary distress.  HEENT: No facial asymmetry, EOMI, no sinus tenderness,  oropharynx pink and moist.  Neck supple no adenopathy.  Chest: decreased air entry scattered crackles  no wheezes  CVS: S1, S2 no murmurs, no S3.  ABD: Soft non tender. Bowel sounds normal.  Ext: No edema  MS: decreased ROM spine, shoulders, hips and knees.  Skin: Intact, no ulcerations or rash noted.  Psych: Good eye contact, normal affect. Memory intact not anxious or depressed appearing.  CNS: CN 2-12 reduced hearing, power,  normal throughout.        Assessment & Plan:

## 2011-02-27 NOTE — Assessment & Plan Note (Signed)
Increased with excessive cough, pt reassured this will improve as cough lessens

## 2011-02-27 NOTE — Assessment & Plan Note (Signed)
Improved, however due to increased risk of cardio toxicity, will reduce fluoxetine dose

## 2011-02-27 NOTE — Assessment & Plan Note (Signed)
Controlled, no change in medication  

## 2011-02-27 NOTE — Assessment & Plan Note (Signed)
Improved control on recent test

## 2011-02-27 NOTE — Assessment & Plan Note (Signed)
Acute episode, antibiotic and decongestant prescribed an Rocephin admiinistered

## 2011-02-27 NOTE — Assessment & Plan Note (Signed)
1 PPD counseled to quit

## 2011-03-21 ENCOUNTER — Ambulatory Visit: Payer: Medicare Other

## 2011-03-22 ENCOUNTER — Ambulatory Visit (INDEPENDENT_AMBULATORY_CARE_PROVIDER_SITE_OTHER): Payer: Medicare Other

## 2011-03-22 VITALS — BP 118/62 | Wt 170.0 lb

## 2011-03-22 DIAGNOSIS — E538 Deficiency of other specified B group vitamins: Secondary | ICD-10-CM

## 2011-03-22 MED ORDER — CYANOCOBALAMIN 1000 MCG/ML IJ SOLN
1000.0000 ug | Freq: Once | INTRAMUSCULAR | Status: AC
  Administered 2011-03-22: 1000 ug via INTRAMUSCULAR

## 2011-03-22 NOTE — Progress Notes (Signed)
Pt received injection in Right Deltoid with no complications

## 2011-05-04 LAB — CBC WITH DIFFERENTIAL/PLATELET
Basophils Relative: 0 % (ref 0–1)
Eosinophils Absolute: 0.3 10*3/uL (ref 0.0–0.7)
Hemoglobin: 13.2 g/dL (ref 13.0–17.0)
MCH: 30.3 pg (ref 26.0–34.0)
MCHC: 33.9 g/dL (ref 30.0–36.0)
Monocytes Absolute: 0.9 10*3/uL (ref 0.1–1.0)
Monocytes Relative: 10 % (ref 3–12)
Neutrophils Relative %: 70 % (ref 43–77)
RDW: 12.9 % (ref 11.5–15.5)

## 2011-05-04 LAB — LIPID PANEL
Total CHOL/HDL Ratio: 4.7 Ratio
VLDL: 26 mg/dL (ref 0–40)

## 2011-05-04 LAB — TSH: TSH: 3.832 u[IU]/mL (ref 0.350–4.500)

## 2011-05-04 LAB — COMPLETE METABOLIC PANEL WITH GFR
ALT: 13 U/L (ref 0–53)
AST: 23 U/L (ref 0–37)
Albumin: 4.2 g/dL (ref 3.5–5.2)
Alkaline Phosphatase: 46 U/L (ref 39–117)
Potassium: 5 mEq/L (ref 3.5–5.3)
Sodium: 133 mEq/L — ABNORMAL LOW (ref 135–145)
Total Protein: 6.3 g/dL (ref 6.0–8.3)

## 2011-05-09 ENCOUNTER — Ambulatory Visit: Payer: Medicare Other | Admitting: Family Medicine

## 2011-05-11 ENCOUNTER — Ambulatory Visit (INDEPENDENT_AMBULATORY_CARE_PROVIDER_SITE_OTHER): Payer: Medicare Other | Admitting: Family Medicine

## 2011-05-11 ENCOUNTER — Encounter: Payer: Self-pay | Admitting: Family Medicine

## 2011-05-11 VITALS — BP 140/70 | HR 72 | Resp 16 | Ht 70.0 in | Wt 173.0 lb

## 2011-05-11 DIAGNOSIS — R5381 Other malaise: Secondary | ICD-10-CM

## 2011-05-11 DIAGNOSIS — F3289 Other specified depressive episodes: Secondary | ICD-10-CM

## 2011-05-11 DIAGNOSIS — I10 Essential (primary) hypertension: Secondary | ICD-10-CM

## 2011-05-11 DIAGNOSIS — E109 Type 1 diabetes mellitus without complications: Secondary | ICD-10-CM

## 2011-05-11 DIAGNOSIS — J449 Chronic obstructive pulmonary disease, unspecified: Secondary | ICD-10-CM

## 2011-05-11 DIAGNOSIS — M25519 Pain in unspecified shoulder: Secondary | ICD-10-CM

## 2011-05-11 DIAGNOSIS — E785 Hyperlipidemia, unspecified: Secondary | ICD-10-CM

## 2011-05-11 DIAGNOSIS — R5383 Other fatigue: Secondary | ICD-10-CM

## 2011-05-11 DIAGNOSIS — E538 Deficiency of other specified B group vitamins: Secondary | ICD-10-CM

## 2011-05-11 DIAGNOSIS — M25512 Pain in left shoulder: Secondary | ICD-10-CM

## 2011-05-11 DIAGNOSIS — F32A Depression, unspecified: Secondary | ICD-10-CM

## 2011-05-11 DIAGNOSIS — F329 Major depressive disorder, single episode, unspecified: Secondary | ICD-10-CM

## 2011-05-11 MED ORDER — DICLOFENAC SODIUM 1 % TD GEL: TRANSDERMAL | Status: DC

## 2011-05-11 MED ORDER — ZOLPIDEM TARTRATE 10 MG PO TABS: 10.0000 mg | ORAL_TABLET | Freq: Every evening | ORAL | Status: DC | PRN

## 2011-05-11 MED ORDER — PREDNISOLONE 5 MG PO TABS: 5.0000 mg | ORAL_TABLET | Freq: Two times a day (BID) | ORAL | Status: DC

## 2011-05-11 MED ORDER — HYDROCODONE-ACETAMINOPHEN 7.5-750 MG PO TABS: 1.0000 | ORAL_TABLET | Freq: Two times a day (BID) | ORAL | Status: DC

## 2011-05-11 MED ORDER — IBUPROFEN 600 MG PO TABS: ORAL_TABLET | ORAL | Status: AC

## 2011-05-11 MED ORDER — INSULIN DETEMIR 100 UNIT/ML ~~LOC~~ SOLN: 10.0000 [IU] | Freq: Every day | SUBCUTANEOUS | Status: DC

## 2011-05-11 MED ORDER — CYANOCOBALAMIN 1000 MCG/ML IJ SOLN
1000.0000 ug | Freq: Once | INTRAMUSCULAR | Status: AC
  Administered 2011-05-11: 1000 ug via INTRAMUSCULAR

## 2011-05-11 NOTE — Patient Instructions (Signed)
F/u in 3.5 month.with rectal exam  Blood sugar has increased, please reduce carb intake and start regular exercise, you should consider joininmg the [employer]  Good cholesterol is low, the rest is good, you need to exercise to improve this.  Medication is sent in for left shoulder pain, if no better , call for referral to orthopedics. The prednisone will increase blood sugar levels short term.  New additional medicine, levemir, is to be started for blood sugar control. You may stop [place] when you wish  Gel to rub on shoulder is prescribed, do not use while taking the ibuprofen and prednisone  B12 today  HBA1C , chem 7, B12 level and microalb in 3 month, non fasting

## 2011-05-11 NOTE — Progress Notes (Signed)
  Subjective:    Patient ID: Donald Everett, male    DOB: 11/28/1940, 71 y.o.   MRN: [id number]  HPI The PT is here for follow up and re-evaluation of chronic medical conditions, medication management and review of any available recent lab and radiology data.  Preventive health is updated, specifically  Cancer screening and Immunization.   Questions or concerns regarding consultations or procedures which the PT has had in the interim are  addressed. The PT denies any adverse reactions to current medications since the last visit.  There are no new concerns.  C/o worsening left shoulder pain which limits mobility, no h/o trauma Fluctuating blood sugars, tries to keep diet the same, not involved in regular exercise      Review of Systems See HPI Denies recent fever or chills. Denies sinus pressure, nasal congestion, ear pain or sore throat. Denies chest congestion, productive cough or wheezing. Denies chest pains, palpitations and leg swelling Denies abdominal pain, nausea, vomiting,diarrhea or constipation.   Denies dysuria, frequency, hesitancy or incontinence. Chronic joint pain, back, and limitation in mobility. Denies headaches, seizures, numbness, or tingling. Denies uncontrolled  depression, anxiety or insomnia. Denies skin break down or rash.        Objective:   Physical Exam Patient alert and oriented and in no cardiopulmonary distress.  HEENT: No facial asymmetry, EOMI, no sinus tenderness,  oropharynx pink and moist.  Neck supple no adenopathy.  Chest: Clear to auscultation bilaterally.decreased air entry bilaterally  CVS: S1, S2 no murmurs, no S3.  ABD: Soft non tender. Bowel sounds normal.  Ext: No edema  MS: Adequate though  ROM spine,markedly reduced ROM left   shoulder, adequate in hips and knees.  Skin: Intact, no ulcerations or rash noted.  Psych: Good eye contact, normal affect. Memory mildly impaired not anxious or depressed appearing.  CNS: CN  2-12 intact hearing loss, power, tone and sensation normal throughout.        Assessment & Plan:

## 2011-05-11 NOTE — Assessment & Plan Note (Signed)
Increasing local left shoulder pain limiting mobility

## 2011-05-13 NOTE — Assessment & Plan Note (Signed)
Worsening due to ongoing nicotine use  

## 2011-05-13 NOTE — Assessment & Plan Note (Signed)
Less well controlled than in the past, pt to add levemir

## 2011-05-13 NOTE — Assessment & Plan Note (Signed)
Adequate though sub optimal control, no med change at this time

## 2011-05-13 NOTE — Assessment & Plan Note (Signed)
Improved and controlled on current med 

## 2011-07-26 ENCOUNTER — Telehealth: Payer: Self-pay | Admitting: Family Medicine

## 2011-07-26 MED ORDER — ALPRAZOLAM 0.5 MG PO TABS: 0.5000 mg | ORAL_TABLET | Freq: Every day | ORAL | Status: DC

## 2011-07-26 NOTE — Telephone Encounter (Signed)
Med sent in.

## 2011-08-04 LAB — BASIC METABOLIC PANEL
Calcium: 9.6 mg/dL (ref 8.4–10.5)
Glucose, Bld: 165 mg/dL — ABNORMAL HIGH (ref 70–99)
Potassium: 4.8 mEq/L (ref 3.5–5.3)
Sodium: 130 mEq/L — ABNORMAL LOW (ref 135–145)

## 2011-08-04 LAB — VITAMIN B12: Vitamin B-12: 414 pg/mL (ref 211–911)

## 2011-08-05 LAB — MICROALBUMIN / CREATININE URINE RATIO
Creatinine, Urine: 56.1 mg/dL
Microalb Creat Ratio: 83.2 mg/g — ABNORMAL HIGH (ref 0.0–30.0)
Microalb, Ur: 4.67 mg/dL — ABNORMAL HIGH (ref 0.00–1.89)

## 2011-08-05 LAB — HEMOGLOBIN A1C
Hgb A1c MFr Bld: 7.2 % — ABNORMAL HIGH (ref <5.7)
Mean Plasma Glucose: 160 mg/dL — ABNORMAL HIGH (ref <117)

## 2011-08-11 ENCOUNTER — Ambulatory Visit (INDEPENDENT_AMBULATORY_CARE_PROVIDER_SITE_OTHER): Payer: Medicare Other | Admitting: Family Medicine

## 2011-08-11 ENCOUNTER — Encounter: Payer: Self-pay | Admitting: Family Medicine

## 2011-08-11 VITALS — BP 140/70 | HR 67 | Resp 18 | Ht 70.0 in | Wt 172.0 lb

## 2011-08-11 DIAGNOSIS — F411 Generalized anxiety disorder: Secondary | ICD-10-CM

## 2011-08-11 DIAGNOSIS — J449 Chronic obstructive pulmonary disease, unspecified: Secondary | ICD-10-CM

## 2011-08-11 DIAGNOSIS — F329 Major depressive disorder, single episode, unspecified: Secondary | ICD-10-CM

## 2011-08-11 DIAGNOSIS — I10 Essential (primary) hypertension: Secondary | ICD-10-CM

## 2011-08-11 DIAGNOSIS — F32A Depression, unspecified: Secondary | ICD-10-CM

## 2011-08-11 DIAGNOSIS — M545 Low back pain, unspecified: Secondary | ICD-10-CM

## 2011-08-11 DIAGNOSIS — G4700 Insomnia, unspecified: Secondary | ICD-10-CM

## 2011-08-11 DIAGNOSIS — E785 Hyperlipidemia, unspecified: Secondary | ICD-10-CM

## 2011-08-11 DIAGNOSIS — E109 Type 1 diabetes mellitus without complications: Secondary | ICD-10-CM

## 2011-08-11 DIAGNOSIS — F3289 Other specified depressive episodes: Secondary | ICD-10-CM

## 2011-08-11 DIAGNOSIS — F17200 Nicotine dependence, unspecified, uncomplicated: Secondary | ICD-10-CM

## 2011-08-11 MED ORDER — CLONIDINE HCL 0.1 MG PO TABS: ORAL_TABLET | ORAL | Status: DC

## 2011-08-11 MED ORDER — LOSARTAN POTASSIUM 100 MG PO TABS: 100.0000 mg | ORAL_TABLET | Freq: Every day | ORAL | Status: DC

## 2011-08-11 NOTE — Progress Notes (Signed)
  Subjective:    Patient ID: Donald Everett, male    DOB: 07-Mar-1940, 71 y.o.   MRN: [id number]  HPI The PT is here for follow up and re-evaluation of chronic medical conditions, medication management and review of any available recent lab and radiology data.  Preventive health is updated, specifically  Cancer screening and Immunization.   Questions or concerns regarding consultations or procedures which the PT has had in the interim are  addressed. The PT denies any adverse reactions to current medications since the last visit.  There are no new concerns.  There are no specific complaints  50 pack year history, still contemplating quitting though he wants for health. Will do chest Ct if available. C/o low blood sugars before lunch on avg twice weekly, otherwise sees vast improvement in his sugars, will reduce morning insuli based on this iinfo  And most recent HBA1C     Review of Systems See Denies recent fever or chills. Denies sinus pressure, nasal congestion, ear pain or sore throat. Denies chest congestion, productive cough or wheezing. Denies chest pains, palpitations and leg swelling Denies abdominal pain, nausea, vomiting,diarrhea or constipation.   Denies dysuria, frequency, hesitancy or incontinence. Chronic back pain Denies headaches, seizures, numbness, or tingling. Denies uncontrolled  depression, anxiety or insomnia. Denies skin break down or rash.    ,    Objective:   Physical Exam  Patient alert and oriented and in no cardiopulmonary distress.  HEENT: No facial asymmetry, EOMI, no sinus tenderness,  oropharynx pink and moist.  Neck supple no adenopathy.  Chest: Clear to auscultation bilaterally.Decrreased air entry bilaterally  CVS: S1, S2 no murmurs, no S3.  ABD: Soft non tender. Bowel sounds normal.  Ext: No edema  MS: decreased  ROM spine, shoulders, hips and knees.  Skin: Intact, no ulcerations or rash noted.  Psych: Good eye contact, normal  affect. Memory intact not anxious or depressed appearing.  CNS: CN 2-12 intact, power, tone and sensation normal throughout.Hearing loss       Assessment & Plan:

## 2011-08-11 NOTE — Patient Instructions (Addendum)
F/u in 3 month  Congrats on much improved blood sugar  Reduce morning insulin to 32 units and have a mid morning snack, like an apple  STOP ZOLPIDEM please.  Blood pressure slightly high, INCREASE CLONIDINE to one and a half tablets at bedtime  HBA1C, fasting lipid, cmp  In 3 month   Please think about quitting smoking.  This is very important for your health.  Consider setting a quit date, then cutting back or switching brands to prepare to stop.  Also think of the money you will save every day by not smoking.  Quick Tips to Quit Smoking: Fix a date i.e. keep a date in mind from when you would not touch a tobacco product to smoke  Keep yourself busy and block your mind with work loads or reading books or watching movies in malls where smoking is not allowed  Vanish off the things which reminds you about smoking for example match box, or your favorite lighter, or the pipe you used for smoking, or your favorite jeans and shirt with which you used to enjoy smoking, or the club where you used to do smoking  Try to avoid certain people places and incidences where and with whom smoking is a common factor to add on  Praise yourself with some token gifts from the money you saved by stopping smoking  Anti Smoking teams are there to help you. Join their programs  Anti-smoking Gums are there in many medical shops. Try them to quit smoking   Side-effects of Smoking: Disease caused by smoking cigarettes are emphysema, bronchitis, heart failures  Premature death  Cancer is the major side effect of smoking  Heart attacks and strokes are the quick effects of smoking causing sudden death  Some smokers lives end up with limbs amputated  Breathing problem or fast breathing is another side effect of smoking  Due to more intakes of smokes, carbon mono-oxide goes into your brain and other muscles of the body which leads to swelling of the veins and blockage to the air passage to lungs  Carbon monoxide  blocks blood vessels which leads to blockage in the flow of blood to different major body organs like heart lungs and thus leads to attacks and deaths  During pregnancy smoking is very harmful and leads to premature birth of the infant, spontaneous abortions, low weight of the infant during birth  Fat depositions to narrow and blocked blood vessels causing heart attacks  In many cases cigarette smoking caused infertility in men    You still need a colonscopy

## 2011-08-13 NOTE — Assessment & Plan Note (Signed)
Uncontrolled additional medication started

## 2011-08-13 NOTE — Assessment & Plan Note (Signed)
Controlled, no change in medication  

## 2011-08-13 NOTE — Assessment & Plan Note (Signed)
Unchanged, continue medication as before 

## 2011-08-13 NOTE — Assessment & Plan Note (Signed)
Deteriorating due to ongoing nicotine use ?

## 2011-08-13 NOTE — Assessment & Plan Note (Signed)
Pt to stop zolpidem

## 2011-08-13 NOTE — Assessment & Plan Note (Signed)
Hyperlipidemia:Low fat diet discussed and encouraged.  Controlled, no change in medication   

## 2011-08-13 NOTE — Assessment & Plan Note (Signed)
Marked improvement, pt applauded on this , no med change

## 2011-08-25 ENCOUNTER — Telehealth: Payer: Self-pay | Admitting: Family Medicine

## 2011-08-30 ENCOUNTER — Other Ambulatory Visit: Payer: Self-pay | Admitting: Family Medicine

## 2011-08-30 ENCOUNTER — Ambulatory Visit (HOSPITAL_COMMUNITY)
Admission: RE | Admit: 2011-08-30 | Discharge: 2011-08-30 | Disposition: A | Discharge status: Home / Self Care | Payer: Medicare Other | Source: Ambulatory Visit | Attending: Family Medicine | Admitting: Family Medicine

## 2011-08-30 DIAGNOSIS — J438 Other emphysema: Secondary | ICD-10-CM | POA: Insufficient documentation

## 2011-08-30 DIAGNOSIS — R059 Cough, unspecified: Secondary | ICD-10-CM | POA: Insufficient documentation

## 2011-08-30 DIAGNOSIS — F17200 Nicotine dependence, unspecified, uncomplicated: Secondary | ICD-10-CM

## 2011-08-30 DIAGNOSIS — I2510 Atherosclerotic heart disease of native coronary artery without angina pectoris: Secondary | ICD-10-CM | POA: Insufficient documentation

## 2011-08-30 DIAGNOSIS — R918 Other nonspecific abnormal finding of lung field: Secondary | ICD-10-CM

## 2011-08-30 DIAGNOSIS — R05 Cough: Secondary | ICD-10-CM | POA: Insufficient documentation

## 2011-08-30 DIAGNOSIS — I517 Cardiomegaly: Secondary | ICD-10-CM | POA: Insufficient documentation

## 2011-09-06 NOTE — Telephone Encounter (Signed)
Patient is aware 

## 2011-09-19 ENCOUNTER — Other Ambulatory Visit: Payer: Self-pay

## 2011-09-19 MED ORDER — ACCU-CHEK MULTICLIX LANCETS MISC: Status: DC

## 2011-09-19 MED ORDER — GLUCOSE BLOOD VI STRP: ORAL_STRIP | Status: AC

## 2011-09-19 MED ORDER — ACCU-CHEK AVIVA PLUS W/DEVICE KIT: 1.0000 | PACK | Freq: Every day | Status: DC

## 2011-10-11 ENCOUNTER — Institutional Professional Consult (permissible substitution): Payer: Medicare Other | Admitting: Pulmonary Disease

## 2011-10-31 ENCOUNTER — Telehealth: Payer: Self-pay | Admitting: Family Medicine

## 2011-10-31 MED ORDER — GABAPENTIN 300 MG PO CAPS: ORAL_CAPSULE | ORAL | Status: DC

## 2011-10-31 NOTE — Telephone Encounter (Signed)
Sent in

## 2011-11-07 LAB — LIPID PANEL
HDL: 26 mg/dL — ABNORMAL LOW (ref >39)
LDL Cholesterol: 74 mg/dL (ref 0–99)
Triglycerides: 117 mg/dL (ref <150)

## 2011-11-07 LAB — COMPREHENSIVE METABOLIC PANEL
ALT: 13 U/L (ref 0–53)
CO2: 27 mEq/L (ref 19–32)
Calcium: 9.7 mg/dL (ref 8.4–10.5)
Chloride: 100 mEq/L (ref 96–112)
Glucose, Bld: 193 mg/dL — ABNORMAL HIGH (ref 70–99)
Sodium: 134 mEq/L — ABNORMAL LOW (ref 135–145)
Total Bilirubin: 0.4 mg/dL (ref 0.3–1.2)
Total Protein: 6.5 g/dL (ref 6.0–8.3)

## 2011-11-07 LAB — HEMOGLOBIN A1C
Hgb A1c MFr Bld: 7.9 % — ABNORMAL HIGH (ref <5.7)
Mean Plasma Glucose: 180 mg/dL — ABNORMAL HIGH (ref <117)

## 2011-11-10 ENCOUNTER — Ambulatory Visit (INDEPENDENT_AMBULATORY_CARE_PROVIDER_SITE_OTHER): Payer: Medicare Other | Admitting: Family Medicine

## 2011-11-10 VITALS — BP 138/70 | HR 74 | Resp 14 | Ht 70.0 in | Wt 170.0 lb

## 2011-11-10 DIAGNOSIS — R911 Solitary pulmonary nodule: Secondary | ICD-10-CM | POA: Insufficient documentation

## 2011-11-10 DIAGNOSIS — F17200 Nicotine dependence, unspecified, uncomplicated: Secondary | ICD-10-CM

## 2011-11-10 DIAGNOSIS — F329 Major depressive disorder, single episode, unspecified: Secondary | ICD-10-CM

## 2011-11-10 DIAGNOSIS — I10 Essential (primary) hypertension: Secondary | ICD-10-CM

## 2011-11-10 DIAGNOSIS — F3289 Other specified depressive episodes: Secondary | ICD-10-CM

## 2011-11-10 DIAGNOSIS — F32A Depression, unspecified: Secondary | ICD-10-CM

## 2011-11-10 DIAGNOSIS — F411 Generalized anxiety disorder: Secondary | ICD-10-CM

## 2011-11-10 DIAGNOSIS — J449 Chronic obstructive pulmonary disease, unspecified: Secondary | ICD-10-CM

## 2011-11-10 DIAGNOSIS — E785 Hyperlipidemia, unspecified: Secondary | ICD-10-CM

## 2011-11-10 DIAGNOSIS — Z23 Encounter for immunization: Secondary | ICD-10-CM

## 2011-11-10 DIAGNOSIS — E109 Type 1 diabetes mellitus without complications: Secondary | ICD-10-CM

## 2011-11-10 MED ORDER — ACCU-CHEK FASTCLIX LANCETS MISC: 1.0000 | Freq: Three times a day (TID) | Status: DC

## 2011-11-10 MED ORDER — ACCU-CHEK MULTICLIX LANCETS MISC: Status: AC

## 2011-11-10 MED ORDER — HYDROCODONE-ACETAMINOPHEN 7.5-750 MG PO TABS: 1.0000 | ORAL_TABLET | Freq: Two times a day (BID) | ORAL | Status: DC

## 2011-11-10 MED ORDER — PRAVASTATIN SODIUM 40 MG PO TABS: 40.0000 mg | ORAL_TABLET | Freq: Every day | ORAL | Status: DC

## 2011-11-10 NOTE — Progress Notes (Signed)
  Subjective:    Patient ID: Donald Everett, male    DOB: 31-Mar-1940, 71 y.o.   MRN: [id number]  HPI The PT is here for follow up and re-evaluation of chronic medical conditions, medication management and review of any available recent lab and radiology data.  Preventive health is updated, specifically  Cancer screening and Immunization.   Questions or concerns regarding consultations or procedures which the PT has had in the interim are  Addressed.Discussed in detail the significnce of abnormal chest CT, encouraged pt to be evaluated by pulmonary specializing in lung nodules, refusing at this time. Seems more motivated to quitting, may stop in th new year, but not comiting, down to 10 per day The PT denies any adverse reactions to current medications since the last visit.  There are no new concerns. Still refusing colonoscopy There are no specific complaints       Review of Systems See HPI Denies recent fever or chills. Denies sinus pressure, nasal congestion, ear pain or sore throat. Denies chest congestion, productive cough or wheezing. Denies chest pains, palpitations and leg swelling Denies abdominal pain, nausea, vomiting,diarrhea or constipation.   Denies dysuria, frequency, hesitancy or incontinence. Chronic back and limitation in mobility. Denies headaches, seizures, numbness, or tingling. Denies depression, uncontrolled , anxiety or insomnia.Uses medication for this Denies skin break down or rash.        Objective:   Physical Exam Patient alert and oriented and in no cardiopulmonary distress.  HEENT: No facial asymmetry, EOMI, no sinus tenderness,  oropharynx pink and moist.  Neck supple no adenopathy.  Chest: Clear to auscultation bilaterally.Decreased air entry throughout  CVS: S1, S2 no murmurs, no S3.  ABD: Soft non tender. Bowel sounds normal.  Ext: No edema  MS: Adequate though reduced  ROM spine, adequate in shoulders, hips and knees.  Skin: Intact, no  ulcerations or rash noted.  Psych: Good eye contact, normal affect. Memory intact not anxious or depressed appearing.  CNS: CN 2-12 intact,Marked hearing loss,  power,  normal throughout.  Diabetic Foot Check:  Appearance - no lesions, ulcers or calluses Skin - no unusual pallor or redness         Assessment & Plan:

## 2011-11-10 NOTE — Patient Instructions (Addendum)
Annual wellness  In 4 months, please call if you need me before   HBA1C , cmp and EGFR in 4 months, before visit.  We will request samples of januvia and insulin as requested by you.   Congrats on cutting back on smoking, you NEED to quit, and you CAN.  Flu vaccine today  INCREASE evening insulin to 12 units every day, and continue taking the morning dose as before pleae, your blood sugar is increased.  Blood pressure and cholesterol are excellent Reduce citalopram to half daily for 6 weeks, then half every other day for 4 weeks, then stop. I do not believe that you need medication for depression anymore  You should see a lung specialist as we discussed today , regarding the nodules in your lungs

## 2011-11-11 DIAGNOSIS — Z23 Encounter for immunization: Secondary | ICD-10-CM

## 2011-11-13 ENCOUNTER — Encounter: Payer: Self-pay | Admitting: Family Medicine

## 2011-11-13 NOTE — Assessment & Plan Note (Signed)
REFUSES further eval by pulmonary at this time

## 2011-11-13 NOTE — Assessment & Plan Note (Signed)
Deteriorated, closer attention to diet and exercise, also dose increase on medication he takes daily

## 2011-11-13 NOTE — Assessment & Plan Note (Signed)
Controlled, no change in medication DASH diet and commitment to daily physical activity for a minimum of 30 minutes discussed and encouraged, as a part of hypertension management. The importance of attaining a healthy weight is also discussed.  

## 2011-11-13 NOTE — Assessment & Plan Note (Signed)
Progressively worsening , pt needs to quit smoking

## 2011-11-13 NOTE — Assessment & Plan Note (Signed)
Controlled, no change in medication Hyperlipidemia:Low fat diet discussed and encouraged.  \ 

## 2011-11-13 NOTE — Assessment & Plan Note (Signed)
Slight improvement in number of cigarettes smoking daily. Patient counseled for approximately 5 minutes regarding the health risks of ongoing nicotine use, specifically all types of cancer, heart disease, stroke and respiratory failure. The options available for help with cessation ,the behavioral changes to assist the process, and the option to either gradully reduce usage  Or abruptly stop.is also discussed. Pt is also encouraged to set specific goals in number of cigarettes used daily, as well as to set a quit date. Unwilling to set quit dATE BUT WANTS TO QUIT

## 2011-11-13 NOTE — Assessment & Plan Note (Signed)
Improved and controlled  

## 2011-11-13 NOTE — Assessment & Plan Note (Signed)
Marked improvement, will attempt to wean off antidepressant grdually

## 2011-12-09 ENCOUNTER — Telehealth: Payer: Self-pay | Admitting: Family Medicine

## 2011-12-12 ENCOUNTER — Telehealth: Payer: Self-pay | Admitting: Family Medicine

## 2011-12-12 ENCOUNTER — Other Ambulatory Visit: Payer: Self-pay | Admitting: Family Medicine

## 2011-12-12 NOTE — Telephone Encounter (Signed)
Pls notify pt novolog will not ne covered, so I switched him to humalog, the vial is entered historically if he prefers the pen pls change to pen keeping dose the same, any questions pls ask

## 2011-12-12 NOTE — Telephone Encounter (Signed)
pls request and fill out diabateic shoe script , as able from [place], i will do the rest, and print oV from 9/26, let him know when done, anything else needed pls send to the the pharmacy, also let me knowif I need to do anything else pls

## 2011-12-12 NOTE — Progress Notes (Signed)
  Subjective:    Patient ID: Donald Everett, male    DOB: 11/24/1940, 71 y.o.   MRN: [id number]  HPI    Review of Systems     Objective:   Physical Exam  Diabetic Foot Check:  Appearance - no lesions, ulcers or calluses Skin - no unusual pallor or redness Sensation - grossly intact to light touch Monofilament testing -  Right - Great toe, medial, central, lateral ball and posterior foot diminished Left - Great toe, medial, central, lateral ball and posterior foot diminished Pulses Left - Dorsalis Pedis and Posterior Tibia normal Right - Dorsalis Pedis and Posterior Tibia normal       Assessment & Plan:

## 2011-12-14 ENCOUNTER — Other Ambulatory Visit: Payer: Self-pay

## 2011-12-14 MED ORDER — INSULIN LISPRO PROT & LISPRO (75-25 MIX) 100 UNIT/ML ~~LOC~~ SUSP: SUBCUTANEOUS | Status: DC

## 2011-12-15 NOTE — Telephone Encounter (Signed)
Complete

## 2012-01-19 ENCOUNTER — Telehealth: Payer: Self-pay | Admitting: Family Medicine

## 2012-01-19 MED ORDER — OMEPRAZOLE 40 MG PO CPDR: DELAYED_RELEASE_CAPSULE | ORAL | Status: DC

## 2012-01-19 MED ORDER — GABAPENTIN 300 MG PO CAPS: ORAL_CAPSULE | ORAL | Status: DC

## 2012-01-19 NOTE — Telephone Encounter (Signed)
Sent in

## 2012-01-23 ENCOUNTER — Encounter: Payer: Self-pay | Admitting: Family Medicine

## 2012-01-23 ENCOUNTER — Ambulatory Visit (INDEPENDENT_AMBULATORY_CARE_PROVIDER_SITE_OTHER): Payer: Medicare Other | Admitting: Family Medicine

## 2012-01-23 VITALS — BP 142/80 | HR 74 | Resp 18 | Ht 70.0 in | Wt 172.0 lb

## 2012-01-23 DIAGNOSIS — R51 Headache: Secondary | ICD-10-CM

## 2012-01-23 DIAGNOSIS — M79672 Pain in left foot: Secondary | ICD-10-CM

## 2012-01-23 DIAGNOSIS — M79609 Pain in unspecified limb: Secondary | ICD-10-CM

## 2012-01-23 DIAGNOSIS — E109 Type 1 diabetes mellitus without complications: Secondary | ICD-10-CM

## 2012-01-23 DIAGNOSIS — I10 Essential (primary) hypertension: Secondary | ICD-10-CM

## 2012-01-23 DIAGNOSIS — M25512 Pain in left shoulder: Secondary | ICD-10-CM

## 2012-01-23 DIAGNOSIS — J302 Other seasonal allergic rhinitis: Secondary | ICD-10-CM

## 2012-01-23 DIAGNOSIS — J309 Allergic rhinitis, unspecified: Secondary | ICD-10-CM

## 2012-01-23 DIAGNOSIS — M25519 Pain in unspecified shoulder: Secondary | ICD-10-CM

## 2012-01-23 DIAGNOSIS — J449 Chronic obstructive pulmonary disease, unspecified: Secondary | ICD-10-CM

## 2012-01-23 DIAGNOSIS — R519 Headache, unspecified: Secondary | ICD-10-CM

## 2012-01-23 DIAGNOSIS — G4700 Insomnia, unspecified: Secondary | ICD-10-CM

## 2012-01-23 MED ORDER — METHYLPREDNISOLONE ACETATE 40 MG/ML IJ SUSP
40.0000 mg | Freq: Once | INTRAMUSCULAR | Status: AC
  Administered 2012-01-23: 40 mg via INTRAMUSCULAR

## 2012-01-23 MED ORDER — PREDNISONE 5 MG PO TABS: ORAL_TABLET | ORAL | Status: AC

## 2012-01-23 MED ORDER — KETOROLAC TROMETHAMINE 60 MG/2ML IJ SOLN
60.0000 mg | Freq: Once | INTRAMUSCULAR | Status: AC
  Administered 2012-01-23: 60 mg via INTRAMUSCULAR

## 2012-01-23 NOTE — Patient Instructions (Addendum)
F/u as before  Toradol 60 mg and depo medrol 40 mg IM in office today for left  foot pain, left shoulder pain And headache.   Prednisone sent to your pharmacy.   No increase in hydrocodone at this time since has not been helping the pain.  Xray of left foot is ordered.  Call back if no better in the next 2 days.  HBA1C and cmp and EGFR past due pls get asap, no fasting  Please STOP sudafed, nose not running and blood pressure slightly increased

## 2012-01-23 NOTE — Progress Notes (Signed)
  Subjective:    Patient ID: Donald Everett, male    DOB: 1940-04-01, 71 y.o.   MRN: [id number]  HPI The PT is here for follow up and re-evaluation of chronic medical conditions, medication management and review of any available recent lab and radiology data.  Preventive health is updated, specifically  Cancer screening and Immunization.   2 month h/o increased nasal congestion with clear drainage and cough productive of clear sputum, no fever or chills, responds to decongestants , not unexpected at this time of the ear with season change. 2 day h/o severe foot pain, kept him awake, now has headache     Review of Systems See HPI Denies chest pains, palpitations and leg swelling Denies abdominal pain, nausea, vomiting,diarrhea or constipation.   Denies dysuria, frequency, hesitancy or incontinence. Denies joint pain, swelling and limitation in mobility. Denies  seizures,.  uncontrolled depression, currently has increased anxiety and insomnia due to new foot painDenies skin break down or rash.        Objective:   Physical Exam Patient alert and oriented and in no cardiopulmonary distress.Pt in pain spasm of shooting pain to foot during visit  HEENT: No facial asymmetry, EOMI, no sinus tenderness,  oropharynx pink and moist.  Neck supple no adenopathy.  Chest: Clear to auscultation bilaterally.Decreased air entry throughout  CVS: S1, S2 no murmurs, no S3.  ABD: Soft non tender. Bowel sounds normal.  Ext: No edema  MS: Decreased  ROM spine, shoulders, hips and knees.  Skin: Intact, no ulcerations or rash noted.  Psych: Good eye contact, normal affect. Memory  anxious not  depressed appearing.  CNS: CN 2-12 intact, power,  normal throughout.        Assessment & Plan:

## 2012-01-24 ENCOUNTER — Ambulatory Visit (HOSPITAL_COMMUNITY)
Admission: RE | Admit: 2012-01-24 | Discharge: 2012-01-24 | Disposition: A | Discharge status: Home / Self Care | Payer: Medicare Other | Source: Ambulatory Visit | Attending: Family Medicine | Admitting: Family Medicine

## 2012-01-24 DIAGNOSIS — M79672 Pain in left foot: Secondary | ICD-10-CM

## 2012-01-24 DIAGNOSIS — M79609 Pain in unspecified limb: Secondary | ICD-10-CM | POA: Insufficient documentation

## 2012-01-31 LAB — COMPLETE METABOLIC PANEL WITH GFR
AST: 20 U/L (ref 0–37)
Alkaline Phosphatase: 46 U/L (ref 39–117)
BUN: 26 mg/dL — ABNORMAL HIGH (ref 6–23)
Calcium: 9.4 mg/dL (ref 8.4–10.5)
Creat: 1.38 mg/dL — ABNORMAL HIGH (ref 0.50–1.35)
GFR, Est Non African American: 51 mL/min — ABNORMAL LOW

## 2012-01-31 LAB — HEMOGLOBIN A1C
Hgb A1c MFr Bld: 7.8 % — ABNORMAL HIGH (ref <5.7)
Mean Plasma Glucose: 177 mg/dL — ABNORMAL HIGH (ref <117)

## 2012-02-01 NOTE — Progress Notes (Signed)
Noted, thanks!

## 2012-02-05 DIAGNOSIS — J302 Other seasonal allergic rhinitis: Secondary | ICD-10-CM

## 2012-02-05 DIAGNOSIS — R519 Headache, unspecified: Secondary | ICD-10-CM | POA: Insufficient documentation

## 2012-02-05 HISTORY — DX: Other seasonal allergic rhinitis: J30.2

## 2012-02-05 NOTE — Assessment & Plan Note (Signed)
Increased and uncontrolled injection in office

## 2012-02-05 NOTE — Assessment & Plan Note (Signed)
uncontrlled , daily flonase and prednisone dose pack will help

## 2012-02-05 NOTE — Assessment & Plan Note (Signed)
Uncontrolled, closer attention to diet and medication admin in appropriate manner advised and encouraged

## 2012-02-05 NOTE — Assessment & Plan Note (Signed)
Acute , due to lack of sleep and pain, and anxiety. No neurologic deficits by history or on exam

## 2012-02-05 NOTE — Assessment & Plan Note (Signed)
Acute pain , no trauma, xray and anti inflammatories

## 2012-02-05 NOTE — Assessment & Plan Note (Signed)
Deteriorated due to pain , now has headache, will treat same with injection of toradol

## 2012-02-05 NOTE — Assessment & Plan Note (Signed)
Worsening due to ongoing nicotine. Counseled to quit, continue spiriva

## 2012-02-05 NOTE — Assessment & Plan Note (Signed)
Not at goal but recently has been using decongestants No med change

## 2012-02-06 ENCOUNTER — Telehealth: Payer: Self-pay | Admitting: Family Medicine

## 2012-02-06 NOTE — Telephone Encounter (Signed)
Pt wants to come in and discuss headaches he has been having. Forgot to mention it at his last appt. BP has been elevated also

## 2012-02-07 ENCOUNTER — Emergency Department (HOSPITAL_COMMUNITY): Payer: Medicare Other

## 2012-02-07 ENCOUNTER — Encounter (HOSPITAL_COMMUNITY): Payer: Self-pay | Admitting: Emergency Medicine

## 2012-02-07 ENCOUNTER — Encounter: Payer: Self-pay | Admitting: Family Medicine

## 2012-02-07 ENCOUNTER — Emergency Department (HOSPITAL_COMMUNITY)
Admission: EM | Admit: 2012-02-07 | Discharge: 2012-02-07 | Disposition: A | Discharge status: Home / Self Care | Payer: Medicare Other | Attending: Emergency Medicine | Admitting: Emergency Medicine

## 2012-02-07 ENCOUNTER — Ambulatory Visit (INDEPENDENT_AMBULATORY_CARE_PROVIDER_SITE_OTHER): Payer: Medicare Other | Admitting: Family Medicine

## 2012-02-07 VITALS — BP 200/90 | HR 70 | Resp 18 | Ht 70.0 in | Wt 167.1 lb

## 2012-02-07 DIAGNOSIS — R519 Headache, unspecified: Secondary | ICD-10-CM

## 2012-02-07 DIAGNOSIS — R51 Headache: Secondary | ICD-10-CM

## 2012-02-07 DIAGNOSIS — I10 Essential (primary) hypertension: Secondary | ICD-10-CM

## 2012-02-07 DIAGNOSIS — F17200 Nicotine dependence, unspecified, uncomplicated: Secondary | ICD-10-CM | POA: Insufficient documentation

## 2012-02-07 DIAGNOSIS — E871 Hypo-osmolality and hyponatremia: Secondary | ICD-10-CM

## 2012-02-07 DIAGNOSIS — Z794 Long term (current) use of insulin: Secondary | ICD-10-CM | POA: Insufficient documentation

## 2012-02-07 DIAGNOSIS — Z79899 Other long term (current) drug therapy: Secondary | ICD-10-CM | POA: Insufficient documentation

## 2012-02-07 DIAGNOSIS — Z8669 Personal history of other diseases of the nervous system and sense organs: Secondary | ICD-10-CM | POA: Insufficient documentation

## 2012-02-07 DIAGNOSIS — E785 Hyperlipidemia, unspecified: Secondary | ICD-10-CM | POA: Insufficient documentation

## 2012-02-07 DIAGNOSIS — E119 Type 2 diabetes mellitus without complications: Secondary | ICD-10-CM | POA: Insufficient documentation

## 2012-02-07 DIAGNOSIS — Z87448 Personal history of other diseases of urinary system: Secondary | ICD-10-CM | POA: Insufficient documentation

## 2012-02-07 DIAGNOSIS — G8929 Other chronic pain: Secondary | ICD-10-CM | POA: Insufficient documentation

## 2012-02-07 DIAGNOSIS — Z8659 Personal history of other mental and behavioral disorders: Secondary | ICD-10-CM | POA: Insufficient documentation

## 2012-02-07 LAB — COMPREHENSIVE METABOLIC PANEL
ALT: 17 U/L (ref 0–53)
AST: 20 U/L (ref 0–37)
Albumin: 3.6 g/dL (ref 3.5–5.2)
Alkaline Phosphatase: 51 U/L (ref 39–117)
GFR calc Af Amer: 74 mL/min — ABNORMAL LOW (ref >90)
Glucose, Bld: 290 mg/dL — ABNORMAL HIGH (ref 70–99)
Potassium: 4.2 mEq/L (ref 3.5–5.1)
Sodium: 127 mEq/L — ABNORMAL LOW (ref 135–145)
Total Protein: 6.8 g/dL (ref 6.0–8.3)

## 2012-02-07 LAB — CBC WITH DIFFERENTIAL/PLATELET
Basophils Absolute: 0 10*3/uL (ref 0.0–0.1)
Eosinophils Absolute: 0.1 10*3/uL (ref 0.0–0.7)
Lymphs Abs: 1.1 10*3/uL (ref 0.7–4.0)
MCH: 30 pg (ref 26.0–34.0)
Neutrophils Relative %: 71 % (ref 43–77)
Platelets: 165 10*3/uL (ref 150–400)
RBC: 4.13 MIL/uL — ABNORMAL LOW (ref 4.22–5.81)
RDW: 12.9 % (ref 11.5–15.5)
WBC: 7.1 10*3/uL (ref 4.0–10.5)

## 2012-02-07 MED ORDER — CLONIDINE HCL 0.1 MG PO TABS
0.1000 mg | ORAL_TABLET | Freq: Once | ORAL | Status: AC
  Administered 2012-02-07: 0.1 mg via ORAL
  Filled 2012-02-07: qty 1

## 2012-02-07 NOTE — ED Provider Notes (Signed)
History   This chart was scribed for Donald Lennert, MD by Charolett Bumpers, ED Scribe. The patient was seen in room APA19/APA19. Patient's care was started at 1118.   CSN: [id number]  Arrival date & time 02/07/12  1057   First MD Initiated Contact with Patient 02/07/12 1118      Chief Complaint  Patient presents with  . Dizziness   HPI Comments: Donald Everett is a 71 y.o. male who presents to the Emergency Department complaining of persistent, moderate dizziness. He states that he checked his BP yesterday and was 160 over something. BP here in ED is 183/72. He reports he has been dizzy for the past couple of weeks and has had associated intermittent headaches that started yesterday. His dizziness is described as a light-headed sensation. He was at Dr. Anthony Sar office this morning prior to being sent to ED for evaluation. He denies any syncope.   Patient is a 71 y.o. male presenting with hypertension. The history is provided by the patient. No language interpreter was used.  Hypertension This is a new problem. The current episode started yesterday. The problem occurs constantly. The problem has been gradually worsening. Associated symptoms include headaches. Pertinent negatives include no chest pain and no abdominal pain. Nothing relieves the symptoms.    Past Medical History  Diagnosis Date  . Allergy   . Anxiety   . Diabetes mellitus   . Hyperlipidemia   . Hypertension   . ED (erectile dysfunction)   . Nicotine addiction   . Chronic low back pain   . Insomnia     History reviewed. No pertinent past surgical history.  No family history on file.  History  Substance Use Topics  . Smoking status: Current Every Day Smoker -- 0.5 packs/day    Types: Cigarettes  . Smokeless tobacco: Not on file  . Alcohol Use: Yes     Comment: 2 beers a day      Review of Systems  Constitutional: Negative for fatigue.  HENT: Negative for congestion, sinus pressure and ear  discharge.   Eyes: Negative for discharge.  Respiratory: Negative for cough.   Cardiovascular: Negative for chest pain.  Gastrointestinal: Negative for abdominal pain and diarrhea.  Genitourinary: Negative for frequency and hematuria.  Musculoskeletal: Negative for back pain.  Skin: Negative for rash.  Neurological: Positive for dizziness and headaches. Negative for seizures.  Hematological: Negative.   Psychiatric/Behavioral: Negative for hallucinations.  All other systems reviewed and are negative.    Allergies  Review of patient's allergies indicates no known allergies.  Home Medications   Current Outpatient Rx  Name  Route  Sig  Dispense  Refill  . ACCU-CHEK FASTCLIX LANCETS MISC   Does not apply   1 each by Does not apply route 3 (three) times daily. DX 250.01   102 each   5   . BD SWAB SINGLE USE REGULAR PADS      USE AS DIRECTED   4 each   1   . ALPRAZOLAM 0.5 MG PO TABS   Oral   Take 1 tablet (0.5 mg total) by mouth at bedtime.   90 tablet   0   . AMLODIPINE BESYLATE 10 MG PO TABS   Oral   Take 10 mg by mouth daily.           Marland Kitchen ACCU-CHEK AVIVA PLUS W/DEVICE KIT   Does not apply   1 kit by Does not apply route daily.   1 kit  0   . CLONIDINE HCL 0.1 MG PO TABS      Dose increase effective 08/11/2011.One and a half tablets at bedtime   135 tablet   3   . DICLOFENAC SODIUM 1 % TD GEL      Apply twice daily as needed to affected area for pain   200 g   1   . FLUTICASONE PROPIONATE 50 MCG/ACT NA SUSP   Nasal   Place 1 spray into the nose daily.   15 g   2   . FLUTICASONE-SALMETEROL 100-50 MCG/DOSE IN AEPB   Inhalation   Inhale 1 puff into the lungs every 12 (twelve) hours.           Marland Kitchen GABAPENTIN 300 MG PO CAPS      One cap twice daily and 2 at bedtime   360 capsule   1   . GLUCOSE BLOOD VI STRP      Use as instructed for three times daily testing  DX 250.00   100 each   12   . HYDROCODONE-ACETAMINOPHEN 7.5-750 MG PO TABS    Oral   Take 1 tablet by mouth 2 (two) times daily.   180 tablet   1   . INSULIN LISPRO PROT & LISPRO (75-25) 100 UNIT/ML [place] SUSP      35 units in am and 20 units in pm D/C novolog mix due to non coverage   10 mL   12   . INSULIN SYRINGE-NEEDLE U-100 31G X 5/16" 0.5 ML MISC      Use as directed   360 each   2   . ACCU-CHEK MULTICLIX LANCETS MISC      For three times daily testing   100 each   12   . LOSARTAN POTASSIUM 100 MG PO TABS   Oral   Take 1 tablet (100 mg total) by mouth daily.   90 tablet   0   . METFORMIN HCL 1000 MG PO TABS   Oral   Take 1,000 mg by mouth 2 (two) times daily with a meal.           . OMEPRAZOLE 40 MG PO CPDR      Take 40 mg by mouth daily.   90 capsule   1   . POLYETHYLENE GLYCOL 3350 PO POWD      Put 17 grams in 8 ounces water every day   255 g   1     DISPENSE 3 MONTH SUPPLY   . PRAVASTATIN SODIUM 40 MG PO TABS   Oral   Take 1 tablet (40 mg total) by mouth daily.   90 tablet   1   . SITAGLIPTIN PHOSPHATE 100 MG PO TABS   Oral   Take 100 mg by mouth daily.           Marland Kitchen TAMSULOSIN HCL 0.4 MG PO CAPS   Oral   Take 1 capsule (0.4 mg total) by mouth daily.   90 capsule   0   . TIOTROPIUM BROMIDE MONOHYDRATE 18 MCG IN CAPS   Inhalation   Place 1 capsule (18 mcg total) into inhaler and inhale daily.   90 capsule   0     BP 183/72  Pulse 70  Temp 98.2 F (36.8 C) (Oral)  Resp 20  Ht 5\' 8"  (1.727 m)  Wt 170 lb (77.111 kg)  BMI 25.85 kg/m2  SpO2 96%  Physical Exam  Nursing note and vitals reviewed. Constitutional: He is  oriented to person, place, and time. He appears well-developed.  HENT:  Head: Normocephalic and atraumatic.  Eyes: Conjunctivae normal and EOM are normal. No scleral icterus.  Neck: Neck supple. No thyromegaly present.  Cardiovascular: Normal rate, regular rhythm and normal heart sounds.  Exam reveals no gallop and no friction rub.   No murmur heard. Pulmonary/Chest: Effort normal and  breath sounds normal. No stridor. He has no wheezes. He has no rales. He exhibits no tenderness.  Abdominal: Soft. Bowel sounds are normal. He exhibits no distension. There is no tenderness. There is no rebound.  Musculoskeletal: Normal range of motion. He exhibits no edema.  Lymphadenopathy:    He has no cervical adenopathy.  Neurological: He is oriented to person, place, and time. Coordination normal.  Skin: No rash noted. No erythema.  Psychiatric: He has a normal mood and affect. His behavior is normal.    ED Course  Procedures (including critical care time)  DIAGNOSTIC STUDIES: Oxygen Saturation is 96% on room air, adequate by my interpretation.    COORDINATION OF CARE:  11:25-Discussed planned course of treatment with the patient including a CT of his head, chest x-ray and blood work, who is agreeable at this time.   13:25-Recheck: Pt states that he feels improved. Informed pt of imaging and lab results. Pt's BP is 157/71 currently. Will increase Clonidine to 1 pill twice daily from 1.5 pills daily and f/u with PCP. Pt is agreeable with plan.    Results for orders placed during the hospital encounter of 02/07/12  CBC WITH DIFFERENTIAL      Component Value Range   WBC 7.1  4.0 - 10.5 K/uL   RBC 4.13 (*) 4.22 - 5.81 MIL/uL   Hemoglobin 12.4 (*) 13.0 - 17.0 g/dL   HCT 16.1 (*) 09.6 - 04.5 %   MCV 86.4  78.0 - 100.0 fL   MCH 30.0  26.0 - 34.0 pg   MCHC 34.7  30.0 - 36.0 g/dL   RDW 40.9  81.1 - 91.4 %   Platelets 165  150 - 400 K/uL   Neutrophils Relative 71  43 - 77 %   Neutro Abs 5.0  1.7 - 7.7 K/uL   Lymphocytes Relative 15  12 - 46 %   Lymphs Abs 1.1  0.7 - 4.0 K/uL   Monocytes Relative 13 (*) 3 - 12 %   Monocytes Absolute 0.9  0.1 - 1.0 K/uL   Eosinophils Relative 2  0 - 5 %   Eosinophils Absolute 0.1  0.0 - 0.7 K/uL   Basophils Relative 0  0 - 1 %   Basophils Absolute 0.0  0.0 - 0.1 K/uL  COMPREHENSIVE METABOLIC PANEL      Component Value Range   Sodium 127 (*)  135 - 145 mEq/L   Potassium 4.2  3.5 - 5.1 mEq/L   Chloride 94 (*) 96 - 112 mEq/L   CO2 27  19 - 32 mEq/L   Glucose, Bld 290 (*) 70 - 99 mg/dL   BUN 21  6 - 23 mg/dL   Creatinine, Ser 7.82  0.50 - 1.35 mg/dL   Calcium 9.5  8.4 - 95.6 mg/dL   Total Protein 6.8  6.0 - 8.3 g/dL   Albumin 3.6  3.5 - 5.2 g/dL   AST 20  0 - 37 U/L   ALT 17  0 - 53 U/L   Alkaline Phosphatase 51  39 - 117 U/L   Total Bilirubin 0.1 (*) 0.3 - 1.2 mg/dL  GFR calc non Af Amer 64 (*) >90 mL/min   GFR calc Af Amer 74 (*) >90 mL/min  TROPONIN I      Component Value Range   Troponin I <0.30  <0.30 ng/mL    Dg Chest 2 View  02/07/2012  *RADIOLOGY REPORT*  Clinical Data: Dizziness for 2 months  CHEST - 2 VIEW  Comparison: None.  Findings: Heart size and vascular pattern are normal.  No infiltrate or effusion.  IMPRESSION: No cardiopulmonary abnormalities.   Original Report Authenticated By: Esperanza Heir, M.D.    Ct Head Wo Contrast  02/07/2012  *RADIOLOGY REPORT*  Clinical Data: Dizziness for 2 months.  Severe headaches started 02/06/2012.  Diabetic hypertensive with hyperlipidemia.  CT HEAD WITHOUT CONTRAST  Technique:  Contiguous axial images were obtained from the base of the skull through the vertex without contrast.  Comparison: 05/30/2006 MR and 05/29/2078 CT.  Findings: No intracranial hemorrhage.  Small vessel disease type changes without CT evidence of large acute infarct.  Mild atrophy without hydrocephalus.  No intracranial mass lesion detected on this unenhanced exam.  Orbital structures unremarkable.  Visualized sinuses, mastoid air cells and middle ear cavities are clear.  IMPRESSION: No intracranial hemorrhage or CT evidence of large acute infarct.  Please see above.   Original Report Authenticated By: Lacy Duverney, M.D.      No diagnosis found.   Date: 02/07/2012  Rate:68  Rhythm: normal sinus rhythm  QRS Axis: left  Intervals: normal  ST/T Wave abnormalities: normal  Conduction  Disutrbances:right bundle branch block  Narrative Interpretation:   Old EKG Reviewed: none available    MDM  The chart was scribed for me under my direct supervision.  I personally performed the history, physical, and medical decision making and all procedures in the evaluation of this patient.Donald Lennert, MD 02/07/12 [id number]

## 2012-02-07 NOTE — ED Notes (Signed)
Pt reports has had dizziness for past couple of months. Pt states severe headache that started on left frontal area at 11am yesterday. Remains dizzy.

## 2012-02-07 NOTE — ED Notes (Signed)
Patient with no complaints at this time. Respirations even and unlabored. Skin warm/dry. Discharge instructions reviewed with patient at this time. Patient given opportunity to voice concerns/ask questions. Patient discharged at this time and left Emergency Department with steady gait.   

## 2012-02-07 NOTE — Progress Notes (Signed)
  Subjective:    Patient ID: Donald Everett, male    DOB: 15-Jun-1940, 71 y.o.   MRN: [id number]  HPI Pt comes in with a 3 week h/o headache , dizziness , light headedness, reporting that he continues to feel progressively worse. No recent head trauma. He is concerned about his blood pressure, stating that he is often told differing values, feels symptoms are related to this. Did not take his medication on the day of the visit, he "forgot" rushing to get to the office. Denies fever, chills or neck stiffness States foot pain resolved though he never got the prednisone, sent to out of state pharmacy, I had spoken to him between visits he reported improvement and never mentioned not hetting the prednisone. Blood sugars continue to fluctuate   Review of Systems See HPI Denies recent fever or chills. Denies sinus pressure, nasal congestion, ear pain or sore throat. Denies chest congestion, productive cough or wheezing.    Chronic back painDenies headaches, seizures, numbness, or tingling. Denies depression,has increased  anxiety  Denies skin break down or rash.         Objective:   Physical Exam Patient alert .Anxious an din pain  HEENT: No facial asymmetry, EOMI, no sinus tenderness,  oropharynx pink and moist.  Neck supple no adenopathy.Fudoscopy no hemmorhage or exudate seen  Chest: Clear to auscultation bilaterally.  CVS: S1, S2 no murmurs, no S3.  ABD: Soft non tender. Bowel sounds normal.  Ext: No edema   Psych: Good eye contact, anxious not depressed appearing.  CNS: CN 2-12 intact,extremely reduced hearing, power, tone and sensation normal throughout.       Assessment & Plan:

## 2012-02-07 NOTE — Patient Instructions (Addendum)
F/U in 10 days  You need to go durectly to the ed due to markedly elevated blood pressure along with your c/o persistent headache headache x 3 weeks and light headedness

## 2012-02-07 NOTE — ED Notes (Signed)
Dr. Estell Harpin notified of con't. 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

## 2012-02-15 NOTE — Assessment & Plan Note (Signed)
Uncontrolled, has not taen medication on day of visit, cozaar administered in office,

## 2012-02-15 NOTE — Assessment & Plan Note (Signed)
persistent x 3 weeks, disabling , associated with light headedness and uncontrolled hypertension, ED eval today. I spoke directly with MD also

## 2012-02-29 ENCOUNTER — Telehealth: Payer: Self-pay | Admitting: Family Medicine

## 2012-02-29 NOTE — Telephone Encounter (Signed)
Called pt , states blood pressure from 140 to 170 and he fweels sleepy all the time. I advised , reduce the bedtime clonidine from 1.5 tabs to 1 tab only, call back for sooner appt if no improvement

## 2012-03-19 ENCOUNTER — Ambulatory Visit (INDEPENDENT_AMBULATORY_CARE_PROVIDER_SITE_OTHER): Payer: Medicare Other | Admitting: Family Medicine

## 2012-03-19 ENCOUNTER — Encounter: Payer: Self-pay | Admitting: Family Medicine

## 2012-03-19 VITALS — BP 168/70 | HR 64 | Resp 16 | Ht 70.0 in | Wt 168.0 lb

## 2012-03-19 DIAGNOSIS — Z0000 Encounter for general adult medical examination without abnormal findings: Secondary | ICD-10-CM

## 2012-03-19 DIAGNOSIS — D649 Anemia, unspecified: Secondary | ICD-10-CM

## 2012-03-19 DIAGNOSIS — Z79899 Other long term (current) drug therapy: Secondary | ICD-10-CM

## 2012-03-19 DIAGNOSIS — I10 Essential (primary) hypertension: Secondary | ICD-10-CM

## 2012-03-19 DIAGNOSIS — E785 Hyperlipidemia, unspecified: Secondary | ICD-10-CM

## 2012-03-19 DIAGNOSIS — R5383 Other fatigue: Secondary | ICD-10-CM

## 2012-03-19 DIAGNOSIS — F17200 Nicotine dependence, unspecified, uncomplicated: Secondary | ICD-10-CM

## 2012-03-19 DIAGNOSIS — R5381 Other malaise: Secondary | ICD-10-CM

## 2012-03-19 DIAGNOSIS — E559 Vitamin D deficiency, unspecified: Secondary | ICD-10-CM

## 2012-03-19 DIAGNOSIS — E109 Type 1 diabetes mellitus without complications: Secondary | ICD-10-CM

## 2012-03-19 MED ORDER — SPIRONOLACTONE 25 MG PO TABS: 25.0000 mg | ORAL_TABLET | Freq: Every day | ORAL | Status: DC

## 2012-03-19 NOTE — Progress Notes (Signed)
Subjective:    Patient ID: Donald Everett, male    DOB: 1940/11/02, 72 y.o.   MRN: [id number]  HPI Preventive Screening-Counseling & Management   Patient present here today for a Medicare annual wellness visit. Concerned that blood pressure has remained high, states he has just not been feeling well ever since this time, not much energy, tires easily. 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 Has noted fatigue  Dietary issues discussed:heart healthy diet, also low carb   Cardiac risk factors: IDDM, CAD by chest CT scan, needs card evel asap  Depression Screen  (Note: if answer to either of the following is "Yes", a more complete depression screening is indicated)   Over the past two weeks, have you felt down, depressed or hopeless? No  Over the past two weeks, have you felt little interest or pleasure in doing things? Yes does not want to get out of couch, doesn't feel well since his blood pressure is high Have you lost interest or pleasure in daily life? yes Do you often feel hopeless? Feels helpless Do you cry easily over simple problems? No   Activities of Daily Living  In your present state of health, do you have any difficulty performing the following activities?  Driving?: yes early in the morning Managing money?: No Feeding yourself?:No Getting from bed to chair?:No Climbing a flight of stairs?:No Preparing food and eating?:No Bathing or showering?:No Getting dressed?:No Getting to the toilet?:No Using the toilet?:No Moving around from place to place?: No, but has  intentionally slowed his pace  Fall Risk Assessment In the past year have you fallen or had a near fall?:No Are you currently taking any medications that make you dizziness?:No   Hearing Difficulties: No Do you often ask people to speak up or repeat themselves?:yes Do you experience ringing or noises in your ears?:yes at times Do you have difficulty understanding soft or whispered voices?:yes  Cognitive Testing  Alert? Yes Normal Appearance?Yes  Oriented to person? Yes Place? Yes  Time? Yes  Displays appropriate judgment?Yes  Can read the correct time from a watch face? yes Are you having problems remembering things?No  Advanced Directives have been discussed with the patient?Yes , full code   List the Names of Other Physician/Practitioners you currently use:[facility], Dr Allyson Sabal, Dr Luciana Axe opthalmology   Indicate any recent Medical Services you may have received from other than [facility] providers in the past year (date may be approximate).   Assessment:    Annual Wellness Exam   Plan:    During the course of the visit the patient was educated and counseled about appropriate screening and preventive services including:  A healthy diet is rich in fruit, vegetables and whole grains. Poultry fish, nuts and beans are a healthy choice for protein rather then red meat. A low sodium diet and drinking 64 ounces of water daily is generally recommended. Oils and sweet should be limited. Carbohydrates especially for those who are diabetic or overweight, should be limited to 30-45 gram per meal. It is important to eat on a regular schedule,  at least 3 times daily. Snacks should be primarily fruits, vegetables or nuts. It is important that you exercise regularly at least 30 minutes 5 times a week. If you develop chest pain, have severe difficulty breathing, or feel very tired, stop exercising immediately and seek medical attention  Immunization reviewed and updated. Cancer screening  reviewed and updated    Patient Instructions (the written plan) was given to the patient.  Medicare Attestation  I have personally reviewed:  The patient's medical and social history  Their use of alcohol, tobacco or illicit drugs  Their current medications and supplements  The patient's functional ability including ADLs,fall risks, home safety risks, cognitive, and hearing and visual impairment  Diet and physical activities  Evidence for depression or mood disorders  The patient's weight, height, BMI, and visual acuity have been recorded in the chart. I have made referrals, counseling, and provided education to the patient based on review of the above and I have provided the patient with a written personalized care plan for preventive services.      Review of Systems     Objective:   Physical Exam        Assessment & Plan:

## 2012-03-19 NOTE — Patient Instructions (Addendum)
F/u end March  Labs today B12, chem7 and EGFR, vit D, tsh  New medication for blood pressure added,spironolactone, take this in the morning with your other pill  Fasting lipid and HBa1C, chem 7, end March, before next visit  You have been given appointment to see your cardiologist next week it is vital that you keep this  Please continue to cut back on cigarettes , you need to quit

## 2012-03-20 LAB — COMPLETE METABOLIC PANEL WITH GFR
ALT: 15 U/L (ref 0–53)
Alkaline Phosphatase: 44 U/L (ref 39–117)
Creat: 1.07 mg/dL (ref 0.50–1.35)
GFR, Est African American: 80 mL/min
GFR, Est Non African American: 69 mL/min
Sodium: 131 mEq/L — ABNORMAL LOW (ref 135–145)
Total Bilirubin: 0.3 mg/dL (ref 0.3–1.2)
Total Protein: 6.2 g/dL (ref 6.0–8.3)

## 2012-03-20 LAB — TSH: TSH: 1.834 u[IU]/mL (ref 0.350–4.500)

## 2012-03-20 LAB — VITAMIN B12: Vitamin B-12: 356 pg/mL (ref 211–911)

## 2012-03-22 ENCOUNTER — Ambulatory Visit (INDEPENDENT_AMBULATORY_CARE_PROVIDER_SITE_OTHER): Payer: Medicare Other

## 2012-03-22 ENCOUNTER — Other Ambulatory Visit: Payer: Self-pay

## 2012-03-22 VITALS — BP 160/80 | Wt 168.4 lb

## 2012-03-22 DIAGNOSIS — E538 Deficiency of other specified B group vitamins: Secondary | ICD-10-CM

## 2012-03-22 MED ORDER — AMLODIPINE BESYLATE 10 MG PO TABS: 10.0000 mg | ORAL_TABLET | Freq: Every day | ORAL | Status: DC

## 2012-03-22 MED ORDER — PRAVASTATIN SODIUM 40 MG PO TABS: 40.0000 mg | ORAL_TABLET | Freq: Every day | ORAL | Status: DC

## 2012-03-22 MED ORDER — CYANOCOBALAMIN 1000 MCG/ML IJ SOLN
1000.0000 ug | Freq: Once | INTRAMUSCULAR | Status: AC
  Administered 2012-03-22: 1000 ug via INTRAMUSCULAR

## 2012-03-22 NOTE — Progress Notes (Signed)
Received injection in luqq with no complications

## 2012-03-31 DIAGNOSIS — Z0000 Encounter for general adult medical examination without abnormal findings: Secondary | ICD-10-CM | POA: Insufficient documentation

## 2012-03-31 NOTE — Assessment & Plan Note (Signed)
Annual wellness completed as documented. Pt has continued to smoke despite uncontrolled blood pressure, increasing fatigue with multiple risk factors for cAD, with documented cVD on chest CT scan. The importance of smoking cessation stressed, referred to cardiology and med changes made for BP Need to improve blood sugar and regular physical activity stressed

## 2012-03-31 NOTE — Assessment & Plan Note (Signed)
Hyperlipidemia:Low fat diet discussed and encouraged.  Updated lab needed 

## 2012-03-31 NOTE — Assessment & Plan Note (Signed)
Uncontrolled,ed increased, also referred to cardiology to due to difficulty getting blood pressure controlled since December with deterioration in pt' function

## 2012-03-31 NOTE — Assessment & Plan Note (Signed)
Uncontrolled with lability in blood sugars. Pt reports improved numbers in the recent past, will f/u hBA1C

## 2012-03-31 NOTE — Assessment & Plan Note (Signed)
Unchanged. Patient counseled for approximately 5 minutes regarding the health risks of ongoing nicotine use, specifically all types of cancer, heart disease, stroke and respiratory failure. The options available for help with cessation ,the behavioral changes to assist the process, and the option to either gradully reduce usage  Or abruptly stop.is also discussed. Pt is also encouraged to set specific goals in number of cigarettes used daily, as well as to set a quit date.  

## 2012-04-02 ENCOUNTER — Telehealth: Payer: Self-pay | Admitting: Family Medicine

## 2012-04-04 NOTE — Telephone Encounter (Signed)
States he needed a refill on his meds but misplaced his note after he left the message yesterday. Will call back to let me know which ones

## 2012-04-05 ENCOUNTER — Other Ambulatory Visit (HOSPITAL_COMMUNITY): Payer: Self-pay | Admitting: Cardiovascular Disease

## 2012-04-05 ENCOUNTER — Other Ambulatory Visit: Payer: Self-pay

## 2012-04-05 DIAGNOSIS — I150 Renovascular hypertension: Secondary | ICD-10-CM

## 2012-04-05 MED ORDER — HYDROCODONE-ACETAMINOPHEN 7.5-325 MG PO TABS: 1.0000 | ORAL_TABLET | Freq: Two times a day (BID) | ORAL | Status: DC | PRN

## 2012-04-09 ENCOUNTER — Telehealth: Payer: Self-pay | Admitting: Family Medicine

## 2012-04-11 ENCOUNTER — Telehealth: Payer: Self-pay | Admitting: Family Medicine

## 2012-04-12 NOTE — Telephone Encounter (Signed)
Wanted meds sent to [facility]

## 2012-04-16 ENCOUNTER — Ambulatory Visit: Payer: Medicare Other | Admitting: Family Medicine

## 2012-04-18 ENCOUNTER — Telehealth: Payer: Self-pay | Admitting: Family Medicine

## 2012-04-18 MED ORDER — HYDROCODONE-ACETAMINOPHEN 7.5-325 MG PO TABS: 1.0000 | ORAL_TABLET | Freq: Two times a day (BID) | ORAL | Status: DC | PRN

## 2012-04-18 NOTE — Telephone Encounter (Signed)
Printed to be sent to [facility]

## 2012-04-19 ENCOUNTER — Ambulatory Visit (INDEPENDENT_AMBULATORY_CARE_PROVIDER_SITE_OTHER): Payer: Medicare Other

## 2012-04-19 VITALS — BP 128/80 | Wt 167.1 lb

## 2012-04-19 DIAGNOSIS — D519 Vitamin B12 deficiency anemia, unspecified: Secondary | ICD-10-CM

## 2012-04-19 NOTE — Telephone Encounter (Signed)
Med refilled 3/5

## 2012-04-20 ENCOUNTER — Ambulatory Visit: Payer: Medicare Other

## 2012-04-20 ENCOUNTER — Inpatient Hospital Stay (HOSPITAL_COMMUNITY): Admission: RE | Admit: 2012-04-20 | Payer: Medicare Other | Source: Ambulatory Visit

## 2012-04-23 DIAGNOSIS — D518 Other vitamin B12 deficiency anemias: Secondary | ICD-10-CM

## 2012-04-23 MED ORDER — CYANOCOBALAMIN 1000 MCG/ML IJ SOLN
1000.0000 ug | Freq: Once | INTRAMUSCULAR | Status: AC
  Administered 2012-04-23: 1000 ug via INTRAMUSCULAR

## 2012-04-23 NOTE — Progress Notes (Signed)
Patient in for monthly B12 injection.  Injection given IM in left gluteal. No sign or symptom of adverse reaction.  No voiced complaints.  Patient aware to return in 4 weeks. Injection given on 3/6 but unable to document until 3/10 due to weather conditions.

## 2012-04-30 LAB — BASIC METABOLIC PANEL
BUN: 25 mg/dL — ABNORMAL HIGH (ref 6–23)
Calcium: 9.8 mg/dL (ref 8.4–10.5)
Creat: 1.05 mg/dL (ref 0.50–1.35)
Glucose, Bld: 170 mg/dL — ABNORMAL HIGH (ref 70–99)

## 2012-04-30 LAB — LIPID PANEL
HDL: 23 mg/dL — ABNORMAL LOW (ref >39)
LDL Cholesterol: 27 mg/dL (ref 0–99)
Total CHOL/HDL Ratio: 3.8 Ratio
Triglycerides: 187 mg/dL — ABNORMAL HIGH (ref <150)
VLDL: 37 mg/dL (ref 0–40)

## 2012-04-30 LAB — HEMOGLOBIN A1C: Hgb A1c MFr Bld: 8.4 % — ABNORMAL HIGH (ref <5.7)

## 2012-05-01 ENCOUNTER — Other Ambulatory Visit: Payer: Self-pay

## 2012-05-01 MED ORDER — ALPRAZOLAM 0.5 MG PO TABS: 0.5000 mg | ORAL_TABLET | Freq: Every day | ORAL | Status: DC

## 2012-05-03 ENCOUNTER — Encounter: Payer: Self-pay | Admitting: Family Medicine

## 2012-05-03 ENCOUNTER — Ambulatory Visit (INDEPENDENT_AMBULATORY_CARE_PROVIDER_SITE_OTHER): Payer: Medicare Other | Admitting: Family Medicine

## 2012-05-03 VITALS — BP 140/68 | HR 71 | Resp 16 | Ht 70.0 in | Wt 167.0 lb

## 2012-05-03 DIAGNOSIS — E785 Hyperlipidemia, unspecified: Secondary | ICD-10-CM

## 2012-05-03 DIAGNOSIS — I10 Essential (primary) hypertension: Secondary | ICD-10-CM

## 2012-05-03 DIAGNOSIS — F411 Generalized anxiety disorder: Secondary | ICD-10-CM

## 2012-05-03 DIAGNOSIS — F17200 Nicotine dependence, unspecified, uncomplicated: Secondary | ICD-10-CM

## 2012-05-03 DIAGNOSIS — J449 Chronic obstructive pulmonary disease, unspecified: Secondary | ICD-10-CM

## 2012-05-03 DIAGNOSIS — E109 Type 1 diabetes mellitus without complications: Secondary | ICD-10-CM

## 2012-05-03 MED ORDER — HYDROCODONE-ACETAMINOPHEN 7.5-325 MG PO TABS: 1.0000 | ORAL_TABLET | Freq: Two times a day (BID) | ORAL | Status: DC | PRN

## 2012-05-03 MED ORDER — AMLODIPINE BESYLATE 10 MG PO TABS: 10.0000 mg | ORAL_TABLET | Freq: Every day | ORAL | Status: DC

## 2012-05-03 MED ORDER — BD SWAB SINGLE USE REGULAR PADS: MEDICATED_PAD | Status: DC

## 2012-05-03 MED ORDER — METFORMIN HCL 1000 MG PO TABS: 1000.0000 mg | ORAL_TABLET | Freq: Two times a day (BID) | ORAL | Status: DC

## 2012-05-03 MED ORDER — INSULIN ASPART PROT & ASPART (70-30 MIX) 100 UNIT/ML ~~LOC~~ SUSP: SUBCUTANEOUS | Status: DC

## 2012-05-03 MED ORDER — "INSULIN SYRINGE-NEEDLE U-100 31G X 5/16"" 0.5 ML MISC": Status: DC

## 2012-05-03 MED ORDER — GABAPENTIN 300 MG PO CAPS: ORAL_CAPSULE | ORAL | Status: DC

## 2012-05-03 MED ORDER — FLUTICASONE-SALMETEROL 100-50 MCG/DOSE IN AEPB: 1.0000 | INHALATION_SPRAY | Freq: Two times a day (BID) | RESPIRATORY_TRACT | Status: DC

## 2012-05-03 NOTE — Patient Instructions (Addendum)
F/u in 3 month  Chem 7 non fasting early May  Blood pressure is good.  Cholesterol is too low reduce pravachol to 4 nights per week.  Please cut back on cheese , triglycerides are high\  13 ciggs max per day in March, 12 in April, 11 in May, 10 in June, 9 in July. Congrats on cigarette reduction to 10 to 15 per day at his time  HBa1C cmp and eGFr and fasting lipids in July  Enjoy Spring and get out more  often

## 2012-05-05 NOTE — Assessment & Plan Note (Signed)
Continues to worsen based on ongoing nicotine use.Patient counseled for approximately 5 minutes regarding the health risks of ongoing nicotine use, specifically all types of cancer, heart disease, stroke and respiratory failure. The options available for help with cessation ,the behavioral changes to assist the process, and the option to either gradully reduce usage  Or abruptly stop.is also discussed. Pt is also encouraged to set specific goals in number of cigarettes used daily, as well as to set a quit date.

## 2012-05-05 NOTE — Assessment & Plan Note (Signed)
Improved, will need to wach sodium and potassium closely now he is back on HCTZ, rept lab in 6 weeks DASH diet and commitment to daily physical activity for a minimum of 30 minutes discussed and encouraged, as a part of hypertension management. The importance of attaining a healthy weight is also discussed.

## 2012-05-05 NOTE — Assessment & Plan Note (Signed)
Patient counseled for approximately 5 minutes regarding the health risks of ongoing nicotine use, specifically all types of cancer, heart disease, stroke and respiratory failure. The options available for help with cessation ,the behavioral changes to assist the process, and the option to either gradully reduce usage  Or abruptly stop.is also discussed. Pt is also encouraged to set specific goals in number of cigarettes used daily, as well as to set a quit date. Cutting back, now at approx 15 per day, understands the need to and wnts to quit will continue to encourage and counsel

## 2012-05-05 NOTE — Progress Notes (Signed)
  Subjective:    Patient ID: Donald Everett, male    DOB: 1940/06/10, 72 y.o.   MRN: [id number]  HPI  The PT is here for follow up and re-evaluation of chronic medical conditions, medication management and review of any available recent lab and radiology data.  Preventive health is updated, specifically  Cancer screening and Immunization.   Questions or concerns regarding consultations or procedures which the PT has had in the interim are  Addressed.Has been evaluated by cardiology, hCTZ added for BP control wit improvement , has upcoming imaging study C/o increased fluctuation in new insulin covered by his insurance, reporting precipitou drops and needing to overeat to get sugar back up. States before a meal , blood sugar can be 200 and after the meal with the mealtime insulin , within 2 hrs it has fallen as low as 80 sand he is weak and shaky. Has been in  Hypoglycemic coma in the remote past and is extremely cautios as a resultTests sugars at least 3 times daily  Review of Systems See HPI Denies recent fever or chills. Denies sinus pressure, nasal congestion, ear pain or sore throat. Denies chest congestion, productive cough or wheezing. Denies chest pains, palpitations and leg swelling Denies abdominal pain, nausea, vomiting,diarrhea or constipation.   Denies dysuria, frequency, hesitancy or incontinence. Denies joint pain, swelling and limitation in mobility. Denies headaches, seizures, numbness, or tingling. Denies uncontrolled depression, anxiety or insomnia. Denies skin break down or rash.        Objective:   Physical Exam  Patient alert and oriented and in no cardiopulmonary distress.  HEENT: No facial asymmetry, EOMI, no sinus tenderness,  oropharynx pink and moist.  Neck supple no adenopathy.  Chest: Clear to auscultation bilaterally.decreased air entry bilterally  CVS: S1, S2 no murmurs, no S3.  ABD: Soft non tender. Bowel sounds normal.  Ext: No edema  MS:  Adequate ROM spine, shoulders, hips and knees.  Skin: Intact, no ulcerations or rash noted.  Psych: Good eye contact, normal affect. Memory intact not anxious or depressed appearing.  CNS: CN 2-12 intact, power,normal throughout.       Assessment & Plan:

## 2012-05-05 NOTE — Assessment & Plan Note (Signed)
Cholesterol very low, reduce dose of med taking by reducing frequency to alternate days counseled re need to reduce cheese intake and butter as triglycerides are high

## 2012-05-05 NOTE — Assessment & Plan Note (Signed)
Deteriorated, pt reprots worsened blood sugar control with new insulin will attempt to pA previous prep Patient educated about the importance of limiting  Carbohydrate intake , the need to commit to daily physical activity for a minimum of 30 minutes , and to commit weight loss. The fact that changes in all these areas will reduce or eliminate all together the development of diabetes is stressed.

## 2012-05-05 NOTE — Assessment & Plan Note (Signed)
Continue med as currently taking

## 2012-05-07 ENCOUNTER — Encounter (HOSPITAL_COMMUNITY): Payer: Medicare Other

## 2012-05-22 ENCOUNTER — Ambulatory Visit (HOSPITAL_COMMUNITY)
Admission: RE | Admit: 2012-05-22 | Discharge: 2012-05-22 | Disposition: A | Discharge status: Home / Self Care | Payer: Medicare Other | Source: Ambulatory Visit | Attending: Cardiovascular Disease | Admitting: Cardiovascular Disease

## 2012-05-22 DIAGNOSIS — I150 Renovascular hypertension: Secondary | ICD-10-CM | POA: Insufficient documentation

## 2012-05-22 NOTE — Progress Notes (Signed)
REnal artery duplex doppler was completed. Larene Pickett RVT

## 2012-05-31 ENCOUNTER — Telehealth: Payer: Self-pay | Admitting: Family Medicine

## 2012-05-31 ENCOUNTER — Ambulatory Visit (INDEPENDENT_AMBULATORY_CARE_PROVIDER_SITE_OTHER): Payer: Medicare Other

## 2012-05-31 VITALS — BP 128/76 | Wt 166.1 lb

## 2012-05-31 DIAGNOSIS — E109 Type 1 diabetes mellitus without complications: Secondary | ICD-10-CM

## 2012-05-31 DIAGNOSIS — E538 Deficiency of other specified B group vitamins: Secondary | ICD-10-CM

## 2012-05-31 MED ORDER — INSULIN ASPART PROT & ASPART (70-30 MIX) 100 UNIT/ML ~~LOC~~ SUSP: SUBCUTANEOUS | Status: DC

## 2012-05-31 MED ORDER — CYANOCOBALAMIN 1000 MCG/ML IJ SOLN
1000.0000 ug | Freq: Once | INTRAMUSCULAR | Status: AC
  Administered 2012-05-31: 1000 ug via INTRAMUSCULAR

## 2012-05-31 NOTE — Telephone Encounter (Signed)
Sent to [facility] 

## 2012-05-31 NOTE — Progress Notes (Signed)
Patient in for monthly B12 injection.  Injection given IM in left gluteal.  No voiced complaints.  No sign or symptom of adverse reaction.

## 2012-06-06 ENCOUNTER — Other Ambulatory Visit: Payer: Self-pay

## 2012-06-06 DIAGNOSIS — E109 Type 1 diabetes mellitus without complications: Secondary | ICD-10-CM

## 2012-06-06 MED ORDER — INSULIN ASPART PROT & ASPART (70-30 MIX) 100 UNIT/ML ~~LOC~~ SUSP: SUBCUTANEOUS | Status: DC

## 2012-06-13 ENCOUNTER — Other Ambulatory Visit: Payer: Self-pay

## 2012-06-13 MED ORDER — LOSARTAN POTASSIUM 100 MG PO TABS: 100.0000 mg | ORAL_TABLET | Freq: Every day | ORAL | Status: DC

## 2012-07-02 ENCOUNTER — Ambulatory Visit (INDEPENDENT_AMBULATORY_CARE_PROVIDER_SITE_OTHER): Payer: Medicare Other

## 2012-07-02 VITALS — Wt 166.0 lb

## 2012-07-02 DIAGNOSIS — E538 Deficiency of other specified B group vitamins: Secondary | ICD-10-CM

## 2012-07-02 MED ORDER — CYANOCOBALAMIN 1000 MCG/ML IJ SOLN
1000.0000 ug | Freq: Once | INTRAMUSCULAR | Status: AC
  Administered 2012-07-02: 1000 ug via INTRAMUSCULAR

## 2012-07-02 NOTE — Progress Notes (Signed)
Received injection with no complications  

## 2012-07-03 LAB — BASIC METABOLIC PANEL
CO2: 24 mEq/L (ref 19–32)
Calcium: 9.6 mg/dL (ref 8.4–10.5)
Glucose, Bld: 117 mg/dL — ABNORMAL HIGH (ref 70–99)
Potassium: 5.1 mEq/L (ref 3.5–5.3)
Sodium: 125 mEq/L — ABNORMAL LOW (ref 135–145)

## 2012-07-05 NOTE — Addendum Note (Signed)
Addended by: Abner Greenspan on: 07/05/2012 12:52 PM   Modules accepted: Orders

## 2012-07-23 ENCOUNTER — Other Ambulatory Visit: Payer: Self-pay

## 2012-07-23 ENCOUNTER — Ambulatory Visit (INDEPENDENT_AMBULATORY_CARE_PROVIDER_SITE_OTHER): Payer: Medicare Other

## 2012-07-23 VITALS — BP 148/64 | Wt 165.0 lb

## 2012-07-23 DIAGNOSIS — E538 Deficiency of other specified B group vitamins: Secondary | ICD-10-CM

## 2012-07-23 MED ORDER — HYDROCODONE-ACETAMINOPHEN 7.5-325 MG PO TABS: 1.0000 | ORAL_TABLET | Freq: Two times a day (BID) | ORAL | Status: DC | PRN

## 2012-07-23 MED ORDER — CYANOCOBALAMIN 1000 MCG/ML IJ SOLN
1000.0000 ug | Freq: Once | INTRAMUSCULAR | Status: AC
  Administered 2012-07-23: 1000 ug via INTRAMUSCULAR

## 2012-07-23 NOTE — Progress Notes (Signed)
Injection given with no complications 

## 2012-08-08 ENCOUNTER — Other Ambulatory Visit: Payer: Self-pay

## 2012-08-08 DIAGNOSIS — I10 Essential (primary) hypertension: Secondary | ICD-10-CM

## 2012-08-08 MED ORDER — CLONIDINE HCL 0.1 MG PO TABS: ORAL_TABLET | ORAL | Status: DC

## 2012-08-08 MED ORDER — OMEPRAZOLE 40 MG PO CPDR: DELAYED_RELEASE_CAPSULE | ORAL | Status: DC

## 2012-08-20 ENCOUNTER — Other Ambulatory Visit: Payer: Self-pay | Admitting: Family Medicine

## 2012-08-20 LAB — COMPLETE METABOLIC PANEL WITH GFR
AST: 22 U/L (ref 0–37)
Albumin: 3.9 g/dL (ref 3.5–5.2)
Alkaline Phosphatase: 51 U/L (ref 39–117)
BUN: 21 mg/dL (ref 6–23)
Calcium: 9.7 mg/dL (ref 8.4–10.5)
Chloride: 96 mEq/L (ref 96–112)
Potassium: 4.9 mEq/L (ref 3.5–5.3)
Sodium: 129 mEq/L — ABNORMAL LOW (ref 135–145)
Total Protein: 6.1 g/dL (ref 6.0–8.3)

## 2012-08-20 LAB — HEMOGLOBIN A1C: Hgb A1c MFr Bld: 7.7 % — ABNORMAL HIGH (ref <5.7)

## 2012-08-20 LAB — LIPID PANEL
LDL Cholesterol: 46 mg/dL (ref 0–99)
Total CHOL/HDL Ratio: 4.4 Ratio
VLDL: 36 mg/dL (ref 0–40)

## 2012-08-22 ENCOUNTER — Encounter: Payer: Self-pay | Admitting: Family Medicine

## 2012-08-22 ENCOUNTER — Ambulatory Visit (INDEPENDENT_AMBULATORY_CARE_PROVIDER_SITE_OTHER): Payer: Self-pay | Admitting: Family Medicine

## 2012-08-22 VITALS — BP 140/70 | HR 70 | Resp 16 | Ht 70.0 in | Wt 166.4 lb

## 2012-08-22 DIAGNOSIS — F411 Generalized anxiety disorder: Secondary | ICD-10-CM

## 2012-08-22 DIAGNOSIS — D518 Other vitamin B12 deficiency anemias: Secondary | ICD-10-CM

## 2012-08-22 DIAGNOSIS — I10 Essential (primary) hypertension: Secondary | ICD-10-CM

## 2012-08-22 DIAGNOSIS — D519 Vitamin B12 deficiency anemia, unspecified: Secondary | ICD-10-CM | POA: Insufficient documentation

## 2012-08-22 DIAGNOSIS — E109 Type 1 diabetes mellitus without complications: Secondary | ICD-10-CM

## 2012-08-22 DIAGNOSIS — F17200 Nicotine dependence, unspecified, uncomplicated: Secondary | ICD-10-CM

## 2012-08-22 DIAGNOSIS — R911 Solitary pulmonary nodule: Secondary | ICD-10-CM

## 2012-08-22 DIAGNOSIS — J449 Chronic obstructive pulmonary disease, unspecified: Secondary | ICD-10-CM

## 2012-08-22 DIAGNOSIS — M545 Low back pain, unspecified: Secondary | ICD-10-CM

## 2012-08-22 DIAGNOSIS — E785 Hyperlipidemia, unspecified: Secondary | ICD-10-CM

## 2012-08-22 MED ORDER — SPIRONOLACTONE 50 MG PO TABS: 50.0000 mg | ORAL_TABLET | Freq: Every day | ORAL | Status: DC

## 2012-08-22 MED ORDER — PRAVASTATIN SODIUM 20 MG PO TABS: 20.0000 mg | ORAL_TABLET | Freq: Every evening | ORAL | Status: DC

## 2012-08-22 MED ORDER — CYANOCOBALAMIN 1000 MCG/ML IJ SOLN
1000.0000 ug | Freq: Once | INTRAMUSCULAR | Status: AC
  Administered 2012-08-22: 1000 ug via INTRAMUSCULAR

## 2012-08-22 NOTE — Progress Notes (Signed)
Patient received b12 injection Im.  Next injection due 8/9

## 2012-08-22 NOTE — Patient Instructions (Addendum)
F/u in 3.5 months, please call if yoou have any problems, espescially with your new blood pressure medication  STOP HCTZ.  Increase spironolactone 25mg  to one twice daily for 4 days, then if you tolerate this with no problems, start taking the TWO 25mg  tablets at one time.  Your new prescription is for spironolactone 50mg  ONCE daily, I will print a 90 day supply and it will be sent to your mail order pharmacy per your request once you call back in ONE WEEK to let us know things are going well   Reduce the pravastatin 40 mg to 3 times weekly. The dose is being reduced to 20mg  every night and this is sent in to your mail order  B12 today and every 4 weeks, your level is a low normal when last checked  Microalb, HBA1C , fasting lipid, cmp and EGFR in 3.5 month  Blood sugar much improved, excellent  You are referred for a chest scan to f/u lung nodule   Please cut back on smoking , quitting is needed for improved health

## 2012-08-24 ENCOUNTER — Telehealth: Payer: Self-pay | Admitting: Family Medicine

## 2012-08-24 NOTE — Telephone Encounter (Signed)
Patient is aware 

## 2012-08-28 ENCOUNTER — Ambulatory Visit (HOSPITAL_COMMUNITY)
Admission: RE | Admit: 2012-08-28 | Discharge: 2012-08-28 | Disposition: A | Discharge status: Home / Self Care | Payer: Medicare Other | Source: Ambulatory Visit | Attending: Family Medicine | Admitting: Family Medicine

## 2012-08-28 ENCOUNTER — Telehealth: Payer: Self-pay | Admitting: Family Medicine

## 2012-08-28 DIAGNOSIS — J4489 Other specified chronic obstructive pulmonary disease: Secondary | ICD-10-CM | POA: Insufficient documentation

## 2012-08-28 DIAGNOSIS — J8410 Pulmonary fibrosis, unspecified: Secondary | ICD-10-CM | POA: Insufficient documentation

## 2012-08-28 DIAGNOSIS — E119 Type 2 diabetes mellitus without complications: Secondary | ICD-10-CM | POA: Insufficient documentation

## 2012-08-28 DIAGNOSIS — R911 Solitary pulmonary nodule: Secondary | ICD-10-CM

## 2012-08-28 DIAGNOSIS — R918 Other nonspecific abnormal finding of lung field: Secondary | ICD-10-CM | POA: Insufficient documentation

## 2012-08-28 DIAGNOSIS — I10 Essential (primary) hypertension: Secondary | ICD-10-CM | POA: Insufficient documentation

## 2012-08-28 DIAGNOSIS — J449 Chronic obstructive pulmonary disease, unspecified: Secondary | ICD-10-CM | POA: Insufficient documentation

## 2012-08-28 DIAGNOSIS — F17200 Nicotine dependence, unspecified, uncomplicated: Secondary | ICD-10-CM | POA: Insufficient documentation

## 2012-08-28 NOTE — Telephone Encounter (Signed)
Called patient and no answer.

## 2012-08-29 ENCOUNTER — Other Ambulatory Visit: Payer: Self-pay

## 2012-08-29 DIAGNOSIS — I10 Essential (primary) hypertension: Secondary | ICD-10-CM

## 2012-08-29 MED ORDER — SPIRONOLACTONE 25 MG PO TABS: 25.0000 mg | ORAL_TABLET | Freq: Two times a day (BID) | ORAL | Status: DC

## 2012-08-29 NOTE — Addendum Note (Signed)
Addended by: Abner Greenspan on: 08/29/2012 02:30 PM   Modules accepted: Orders, Medications

## 2012-08-29 NOTE — Telephone Encounter (Signed)
States he is scared to take it with his other BP meds since it knocks him out so bad. I tried to get him to take it at night and not take his sleep med but he said he was scared to and wasn't going to do it

## 2012-08-29 NOTE — Telephone Encounter (Signed)
Noted tell him since thios is the case, just stay on the 25 mg tablet, if new script for l;ower dose needs to be printed pls do this , thanks

## 2012-08-29 NOTE — Telephone Encounter (Signed)
Patient is referring to spironolactone. Cannot take the 50 mg, wants to go back to the 25mg . It makes him too sleepy. Please advise

## 2012-08-29 NOTE — Telephone Encounter (Signed)
rx sent to the pharmacy. 

## 2012-08-29 NOTE — Telephone Encounter (Signed)
States he can take one in the am and one in the pm but just not at one time. Per Dr he can take it like that and I will change the rx for the spiroloactone

## 2012-08-29 NOTE — Telephone Encounter (Signed)
Is he willing to try to take the 50mg  dose at bedtime?This is what I would suggest at first, he can try top stop the medication for sleep that he takes also

## 2012-09-12 ENCOUNTER — Other Ambulatory Visit: Payer: Self-pay

## 2012-09-12 MED ORDER — ALPRAZOLAM 0.5 MG PO TABS: 0.5000 mg | ORAL_TABLET | Freq: Every day | ORAL | Status: DC

## 2012-09-17 NOTE — Progress Notes (Signed)
  Subjective:    Patient ID: Donald Everett, male    DOB: 1940/06/14, 72 y.o.   MRN: [id number]  HPI The PT is here for follow up and re-evaluation of chronic medical conditions, medication management and review of any available recent lab and radiology data.  Preventive health is updated, specifically  Cancer screening and Immunization.   Still refusing colonoscopy Willing to do f/u chest CT scan The PT denies any adverse reactions to current medications since the last visit.  States BP too high at times over 160 There are no specific complaints  Denies hypoglycemic episodes, [polyuria or polydipsia     Review of Systems See HPI Denies recent fever or chills. Denies sinus pressure, nasal congestion, ear pain or sore throat. Denies chest congestion, productive cough or wheezing. Denies chest pains, palpitations and leg swelling Denies abdominal pain, nausea, vomiting,diarrhea or constipation.   Denies dysuria, frequency, hesitancy or incontinence. Chronic back pain unchanged Denies headaches, seizures, numbness, or tingling. Denies depression, anxiety or insomnia. Denies skin break down or rash.        Objective:   Physical Exam  Patient alert and oriented and in no cardiopulmonary distress.  HEENT: No facial asymmetry, EOMI, no sinus tenderness,  oropharynx pink and moist.  Neck supple no adenopathy.  Chest: decreased though adequate air entry, few crackles, no wheezes CVS: S1, S2 no murmurs, no S3.  ABD: Soft non tender. Bowel sounds normal.  Ext: No edema  MS: decreased though adequate  ROM spine,normal rOM  shoulders, hips and knees.  Skin: Intact, no ulcerations or rash noted.  Psych: Good eye contact, normal affect. Memory intact not anxious or depressed appearing.  CNS: CN 2-12 intact, power, tone and sensation normal throughout.       Assessment & Plan:

## 2012-09-17 NOTE — Assessment & Plan Note (Signed)
Elevated TG pt encouraged  To reduce fried and fatty food intake, no med change

## 2012-09-17 NOTE — Assessment & Plan Note (Signed)
Improved, uses xanax at bedtime only for assistance with sleep

## 2012-09-17 NOTE — Assessment & Plan Note (Signed)
Improved and controlled. No med change. Pt applauded on this. Patient advised to reduce carb and sweets, commit to regular physical activity, take meds as prescribed, test blood as directed, and attempt to lose weight, to improve blood sugar control.

## 2012-09-17 NOTE — Assessment & Plan Note (Signed)
Unchanged. Patient counseled for approximately 5 minutes regarding the health risks of ongoing nicotine use, specifically all types of cancer, heart disease, stroke and respiratory failure. The options available for help with cessation ,the behavioral changes to assist the process, and the option to either gradully reduce usage  Or abruptly stop.is also discussed. Pt is also encouraged to set specific goals in number of cigarettes used daily, as well as to set a quit date.  

## 2012-09-17 NOTE — Assessment & Plan Note (Signed)
Uncontrolled, dose increase in spironolactone as tolerated DASH diet and commitment to daily physical activity for a minimum of 30 minutes discussed and encouraged, as a part of hypertension management. The importance of attaining a healthy weight is also discussed.

## 2012-09-17 NOTE — Assessment & Plan Note (Signed)
Unchanged, continue meds as before 

## 2012-09-17 NOTE — Assessment & Plan Note (Signed)
Progressively worsening due to ongoing nicotine. Pt unwilling to set quit date as yet

## 2012-09-24 ENCOUNTER — Ambulatory Visit (INDEPENDENT_AMBULATORY_CARE_PROVIDER_SITE_OTHER): Payer: Medicare Other

## 2012-09-24 ENCOUNTER — Other Ambulatory Visit: Payer: Self-pay | Admitting: Family Medicine

## 2012-09-24 VITALS — BP 130/68 | Wt 164.1 lb

## 2012-09-24 DIAGNOSIS — D518 Other vitamin B12 deficiency anemias: Secondary | ICD-10-CM

## 2012-09-24 DIAGNOSIS — D519 Vitamin B12 deficiency anemia, unspecified: Secondary | ICD-10-CM

## 2012-09-24 MED ORDER — CYANOCOBALAMIN 1000 MCG/ML IJ SOLN
1000.0000 ug | Freq: Once | INTRAMUSCULAR | Status: AC
  Administered 2012-09-24: 1000 ug via INTRAMUSCULAR

## 2012-09-24 NOTE — Progress Notes (Signed)
Patient in for routine b12 injection.  Injection given in left deltoid.  No sign or symptom of adverse reaction.  No voiced complaints.  Patient aware that he should return in 30 days for next injection.

## 2012-10-19 ENCOUNTER — Other Ambulatory Visit: Payer: Self-pay

## 2012-10-19 DIAGNOSIS — I10 Essential (primary) hypertension: Secondary | ICD-10-CM

## 2012-10-19 MED ORDER — CLONIDINE HCL 0.1 MG PO TABS: ORAL_TABLET | ORAL | Status: DC

## 2012-10-19 MED ORDER — HYDROCODONE-ACETAMINOPHEN 7.5-325 MG PO TABS: 1.0000 | ORAL_TABLET | Freq: Two times a day (BID) | ORAL | Status: DC | PRN

## 2012-10-29 ENCOUNTER — Ambulatory Visit (INDEPENDENT_AMBULATORY_CARE_PROVIDER_SITE_OTHER): Payer: Medicare Other

## 2012-10-29 VITALS — BP 140/58 | Wt 161.0 lb

## 2012-10-29 DIAGNOSIS — Z23 Encounter for immunization: Secondary | ICD-10-CM

## 2012-10-29 DIAGNOSIS — D519 Vitamin B12 deficiency anemia, unspecified: Secondary | ICD-10-CM

## 2012-10-29 DIAGNOSIS — D518 Other vitamin B12 deficiency anemias: Secondary | ICD-10-CM

## 2012-10-29 MED ORDER — CYANOCOBALAMIN 1000 MCG/ML IJ SOLN
1000.0000 ug | Freq: Once | INTRAMUSCULAR | Status: AC
  Administered 2012-10-29: 1000 ug via INTRAMUSCULAR

## 2012-10-29 NOTE — Progress Notes (Signed)
Patient in for monthly b12 injection.  Injection given IM in right deltoid per patient request.  No sign or symptom of adverse reaction.  No voiced complaints.  Patient aware that he should return in 4 weeks for next injection  Flu shot also given in left deltoid.  FLU VIS provided.

## 2012-11-23 ENCOUNTER — Ambulatory Visit (INDEPENDENT_AMBULATORY_CARE_PROVIDER_SITE_OTHER): Payer: Medicare Other

## 2012-11-23 ENCOUNTER — Encounter (INDEPENDENT_AMBULATORY_CARE_PROVIDER_SITE_OTHER): Payer: Self-pay

## 2012-11-23 VITALS — BP 130/70 | Wt 162.0 lb

## 2012-11-23 DIAGNOSIS — D518 Other vitamin B12 deficiency anemias: Secondary | ICD-10-CM

## 2012-11-23 DIAGNOSIS — D519 Vitamin B12 deficiency anemia, unspecified: Secondary | ICD-10-CM

## 2012-11-23 MED ORDER — CYANOCOBALAMIN 1000 MCG/ML IJ SOLN
1000.0000 ug | Freq: Once | INTRAMUSCULAR | Status: AC
  Administered 2012-11-23: 1000 ug via INTRAMUSCULAR

## 2012-11-28 ENCOUNTER — Telehealth: Payer: Self-pay | Admitting: Family Medicine

## 2012-11-28 NOTE — Telephone Encounter (Signed)
Faxed to [facility] [place]

## 2012-12-11 ENCOUNTER — Other Ambulatory Visit (HOSPITAL_COMMUNITY): Payer: Self-pay | Admitting: Cardiovascular Disease

## 2012-12-11 DIAGNOSIS — I150 Renovascular hypertension: Secondary | ICD-10-CM

## 2012-12-12 ENCOUNTER — Other Ambulatory Visit: Payer: Self-pay | Admitting: Family Medicine

## 2012-12-12 LAB — HEMOGLOBIN A1C
Hgb A1c MFr Bld: 9.1 % — ABNORMAL HIGH (ref <5.7)
Mean Plasma Glucose: 214 mg/dL — ABNORMAL HIGH (ref <117)

## 2012-12-13 ENCOUNTER — Encounter (INDEPENDENT_AMBULATORY_CARE_PROVIDER_SITE_OTHER): Payer: Self-pay

## 2012-12-13 ENCOUNTER — Ambulatory Visit (INDEPENDENT_AMBULATORY_CARE_PROVIDER_SITE_OTHER): Payer: Medicare Other | Admitting: Family Medicine

## 2012-12-13 ENCOUNTER — Encounter: Payer: Self-pay | Admitting: Family Medicine

## 2012-12-13 ENCOUNTER — Other Ambulatory Visit: Payer: Self-pay | Admitting: Family Medicine

## 2012-12-13 VITALS — BP 132/60 | HR 90 | Resp 18 | Ht 70.0 in | Wt 166.0 lb

## 2012-12-13 DIAGNOSIS — D519 Vitamin B12 deficiency anemia, unspecified: Secondary | ICD-10-CM

## 2012-12-13 DIAGNOSIS — E1159 Type 2 diabetes mellitus with other circulatory complications: Secondary | ICD-10-CM

## 2012-12-13 DIAGNOSIS — IMO0002 Reserved for concepts with insufficient information to code with codable children: Secondary | ICD-10-CM

## 2012-12-13 DIAGNOSIS — E785 Hyperlipidemia, unspecified: Secondary | ICD-10-CM

## 2012-12-13 DIAGNOSIS — D518 Other vitamin B12 deficiency anemias: Secondary | ICD-10-CM

## 2012-12-13 DIAGNOSIS — E109 Type 1 diabetes mellitus without complications: Secondary | ICD-10-CM

## 2012-12-13 DIAGNOSIS — J309 Allergic rhinitis, unspecified: Secondary | ICD-10-CM

## 2012-12-13 DIAGNOSIS — I10 Essential (primary) hypertension: Secondary | ICD-10-CM

## 2012-12-13 DIAGNOSIS — Z91199 Patient's noncompliance with other medical treatment and regimen due to unspecified reason: Secondary | ICD-10-CM

## 2012-12-13 DIAGNOSIS — J449 Chronic obstructive pulmonary disease, unspecified: Secondary | ICD-10-CM

## 2012-12-13 DIAGNOSIS — Z5320 Procedure and treatment not carried out because of patient's decision for unspecified reasons: Secondary | ICD-10-CM

## 2012-12-13 DIAGNOSIS — F17200 Nicotine dependence, unspecified, uncomplicated: Secondary | ICD-10-CM

## 2012-12-13 DIAGNOSIS — H906 Mixed conductive and sensorineural hearing loss, bilateral: Secondary | ICD-10-CM

## 2012-12-13 DIAGNOSIS — E871 Hypo-osmolality and hyponatremia: Secondary | ICD-10-CM

## 2012-12-13 DIAGNOSIS — Z5329 Procedure and treatment not carried out because of patient's decision for other reasons: Secondary | ICD-10-CM

## 2012-12-13 DIAGNOSIS — G4700 Insomnia, unspecified: Secondary | ICD-10-CM

## 2012-12-13 DIAGNOSIS — M545 Low back pain, unspecified: Secondary | ICD-10-CM

## 2012-12-13 DIAGNOSIS — F411 Generalized anxiety disorder: Secondary | ICD-10-CM

## 2012-12-13 DIAGNOSIS — J302 Other seasonal allergic rhinitis: Secondary | ICD-10-CM

## 2012-12-13 DIAGNOSIS — N529 Male erectile dysfunction, unspecified: Secondary | ICD-10-CM

## 2012-12-13 LAB — LIPID PANEL
HDL: 27 mg/dL — ABNORMAL LOW (ref >39)
Total CHOL/HDL Ratio: 4.1 Ratio
VLDL: 26 mg/dL (ref 0–40)

## 2012-12-13 LAB — COMPLETE METABOLIC PANEL WITH GFR
ALT: 10 U/L (ref 0–53)
Alkaline Phosphatase: 52 U/L (ref 39–117)
Creat: 1.21 mg/dL (ref 0.50–1.35)
GFR, Est African American: 69 mL/min
Sodium: 128 mEq/L — ABNORMAL LOW (ref 135–145)
Total Bilirubin: 0.4 mg/dL (ref 0.3–1.2)
Total Protein: 6.4 g/dL (ref 6.0–8.3)

## 2012-12-13 LAB — MICROALBUMIN / CREATININE URINE RATIO
Creatinine, Urine: 57.4 mg/dL
Microalb, Ur: 1.71 mg/dL (ref 0.00–1.89)

## 2012-12-13 LAB — VITAMIN B12: Vitamin B-12: 628 pg/mL (ref 211–911)

## 2012-12-13 MED ORDER — SITAGLIP PHOS-METFORMIN HCL ER 100-1000 MG PO TB24: 2.0000 | ORAL_TABLET | Freq: Every day | ORAL | Status: DC

## 2012-12-13 MED ORDER — HYDROCODONE-ACETAMINOPHEN 7.5-325 MG PO TABS: 1.0000 | ORAL_TABLET | Freq: Two times a day (BID) | ORAL | Status: DC | PRN

## 2012-12-13 NOTE — Progress Notes (Signed)
  Subjective:    Patient ID: Donald Everett, male    DOB: [birth year], 72 y.o.   MRN: [id number]  HPI The PT is here for follow up and re-evaluation of chronic medical conditions, medication management and review of any available recent lab and radiology data.  Preventive health is updated, specifically  Cancer screening and Immunization.   Questions or concerns regarding consultations or procedures which the PT has had in the interim are  Addressed.Has had eye exam The PT denies any adverse reactions to current medications since the last visit.  C/o increased fatigue, states he has not had his meter for past 4 weeks, and blood sugar fluctuates a lot. Unable to afford medication also    Review of Systems See HPI Denies recent fever or chills.c/o fatigue and excessive thirst Denies sinus pressure, nasal congestion, ear pain or sore throat. Denies chest congestion, productive cough or wheezing. Denies chest pains, palpitations and leg swelling Denies abdominal pain, nausea, vomiting,diarrhea or constipation.   Denies dysuria, frequency, hesitancy or incontinence. Chronic back pain is unchnaged Denies headaches, seizures, numbness, or tingling. Denies depression, does have mild , ongoing  anxiety and  insomnia. Denies skin break down or rash.        Objective:   Physical Exam  Patient alert and oriented and in no cardiopulmonary distress.Marked hearing loss,   HEENT: No facial asymmetry, EOMI, no sinus tenderness,  oropharynx pink and moist.  Neck supple no adenopathy.  Chest: Clear to auscultation bilaterally.Decreased air entry throughout  CVS: S1, S2 no murmurs, no S3.  ABD: Soft non tender. .  Ext: No edema  MS: Adequate though reduced  ROM spine, shoulders, hips and knees.  Skin: Intact, no ulcerations or rash noted.  Psych: Good eye contact, flat  affect.  not anxious or depressed appearing.  CNS: CN 2-12 intact, power,  normal throughout.       Assessment &  Plan:

## 2012-12-13 NOTE — Patient Instructions (Addendum)
F/u in 6 weeks, please bring meter and record of blood sugar values  I DO BELIEVE that you will benefit from going to a diabetes specialist, and we do have one in [place], so please give this more thought, I am happy to refer you once you decide to go, you will benefit  Test blood sugars 4 times daily, before breakfast, 2 hours after  lunch, before supper and at bedtime and bring meter to vist. Writing down your numbers is a good also, helps to keep up better.  Before meals blood sugar range between 130 to 170 is good  After meals or at bedtime, range between 150 to 180 is good  We are trying to get help for your meds through the health dept since in [place]  Script given to you at visit for your meter   Increase salt intake as sodium is low  B12 level will be added to labs from 2 days ago

## 2012-12-14 ENCOUNTER — Other Ambulatory Visit: Payer: Self-pay

## 2012-12-14 DIAGNOSIS — E109 Type 1 diabetes mellitus without complications: Secondary | ICD-10-CM

## 2012-12-14 DIAGNOSIS — J449 Chronic obstructive pulmonary disease, unspecified: Secondary | ICD-10-CM

## 2012-12-14 MED ORDER — FLUTICASONE-SALMETEROL 100-50 MCG/DOSE IN AEPB: 1.0000 | INHALATION_SPRAY | Freq: Two times a day (BID) | RESPIRATORY_TRACT | Status: DC

## 2012-12-14 MED ORDER — TAMSULOSIN HCL 0.4 MG PO CAPS: 0.4000 mg | ORAL_CAPSULE | Freq: Every day | ORAL | Status: DC

## 2012-12-14 MED ORDER — SITAGLIP PHOS-METFORMIN HCL ER 100-1000 MG PO TB24: 2.0000 | ORAL_TABLET | Freq: Every day | ORAL | Status: DC

## 2012-12-14 MED ORDER — TIOTROPIUM BROMIDE MONOHYDRATE 18 MCG IN CAPS: 18.0000 ug | ORAL_CAPSULE | Freq: Every day | RESPIRATORY_TRACT | Status: DC

## 2012-12-14 MED ORDER — GABAPENTIN 300 MG PO CAPS: ORAL_CAPSULE | ORAL | Status: DC

## 2012-12-14 MED ORDER — INSULIN ASPART PROT & ASPART (70-30 MIX) 100 UNIT/ML ~~LOC~~ SUSP: SUBCUTANEOUS | Status: DC

## 2012-12-16 DIAGNOSIS — E871 Hypo-osmolality and hyponatremia: Secondary | ICD-10-CM | POA: Insufficient documentation

## 2012-12-16 DIAGNOSIS — E1140 Type 2 diabetes mellitus with diabetic neuropathy, unspecified: Secondary | ICD-10-CM | POA: Insufficient documentation

## 2012-12-16 DIAGNOSIS — Z5329 Procedure and treatment not carried out because of patient's decision for other reasons: Secondary | ICD-10-CM | POA: Insufficient documentation

## 2012-12-16 DIAGNOSIS — Z91199 Patient's noncompliance with other medical treatment and regimen due to unspecified reason: Secondary | ICD-10-CM | POA: Insufficient documentation

## 2012-12-16 NOTE — Assessment & Plan Note (Signed)
unchnaged , continue medication

## 2012-12-16 NOTE — Assessment & Plan Note (Signed)
Controlled, no change in medication  

## 2012-12-16 NOTE — Assessment & Plan Note (Signed)
Recommend treatment by endo strongly, pt unwilling at this time

## 2012-12-16 NOTE — Assessment & Plan Note (Signed)
Controlled, no change in medication Low HDL, p tis hoigh risk for vascular disease

## 2012-12-16 NOTE — Assessment & Plan Note (Signed)
Continues to refuse colonoscopy and also the help of endocrinology is offered due to multiple problems associated with his long standing diabetes which has deteriorrated. Hopefully he will be convinced to change his mind about both

## 2012-12-16 NOTE — Assessment & Plan Note (Signed)
exaccerbated by diuretic in bP med, pt to increase sodium intake and restrict water intake for a short while. Part of the problem is also because of polyuria form elevated blood sugars

## 2012-12-16 NOTE — Assessment & Plan Note (Signed)
Despite good sleep hygiene , continues to rely on xanax at bedtime

## 2012-12-16 NOTE — Assessment & Plan Note (Signed)
Unchanged, and unwilling to quoit Patient counseled for approximately 5 minutes regarding the health risks of ongoing nicotine use, specifically all types of cancer, heart disease, stroke and respiratory failure. The options available for help with cessation ,the behavioral changes to assist the process, and the option to either gradully reduce usage  Or abruptly stop.is also discussed. Pt is also encouraged to set specific goals in number of cigarettes used daily, as well as to set a quit date.

## 2012-12-16 NOTE — Assessment & Plan Note (Signed)
Controlled, no change in medication DASH diet and commitment to daily physical activity for a minimum of 30 minutes discussed and encouraged, as a part of hypertension management. The importance of attaining a healthy weight is also discussed.  

## 2012-12-16 NOTE — Assessment & Plan Note (Signed)
Would benefit greatly from hearing aids, still resists

## 2012-12-16 NOTE — Assessment & Plan Note (Signed)
increeased fatigue despite monthly injections, re check level

## 2012-12-16 NOTE — Assessment & Plan Note (Signed)
Unchanged, continue medication as before 

## 2012-12-16 NOTE — Assessment & Plan Note (Signed)
Deteriorated, strongly recommend endo to treat patient , he is considering this. Patient advised to reduce carb and sweets, commit to regular physical activity, take meds as prescribed, test blood as directed, and attempt to lose weight, to improve blood sugar control. Cost of medication a problem, reports being in the donut hole from first quarter of year , refer to HD for help

## 2012-12-16 NOTE — Assessment & Plan Note (Signed)
Progressively worsening dure to ongoing nicotine use , cessation counseling done

## 2012-12-19 ENCOUNTER — Telehealth: Payer: Self-pay | Admitting: Family Medicine

## 2012-12-19 ENCOUNTER — Ambulatory Visit (HOSPITAL_COMMUNITY)
Admission: RE | Admit: 2012-12-19 | Discharge: 2012-12-19 | Disposition: A | Discharge status: Home / Self Care | Payer: Medicare Other | Source: Ambulatory Visit | Attending: Cardiovascular Disease | Admitting: Cardiovascular Disease

## 2012-12-19 DIAGNOSIS — I150 Renovascular hypertension: Secondary | ICD-10-CM

## 2012-12-19 NOTE — Telephone Encounter (Signed)
Patient aware to get the handwritten rx for meter strips and lancets that [facility]. He then can take it to [place]

## 2012-12-19 NOTE — Progress Notes (Signed)
Renal Artery Duplex Completed. °Brianna L Mazza,RVT °

## 2012-12-25 ENCOUNTER — Telehealth: Payer: Self-pay | Admitting: Family Medicine

## 2012-12-25 ENCOUNTER — Ambulatory Visit (INDEPENDENT_AMBULATORY_CARE_PROVIDER_SITE_OTHER): Payer: Medicare Other

## 2012-12-25 VITALS — BP 140/68 | Wt 164.1 lb

## 2012-12-25 DIAGNOSIS — D518 Other vitamin B12 deficiency anemias: Secondary | ICD-10-CM

## 2012-12-25 DIAGNOSIS — D519 Vitamin B12 deficiency anemia, unspecified: Secondary | ICD-10-CM

## 2012-12-25 MED ORDER — CYANOCOBALAMIN 1000 MCG/ML IJ SOLN
1000.0000 ug | Freq: Once | INTRAMUSCULAR | Status: AC
  Administered 2012-12-25: 1000 ug via INTRAMUSCULAR

## 2012-12-25 NOTE — Progress Notes (Signed)
Patient in for monthly b12 injection.  Injection given in right deltoid.  No sign or symptom of adverse reaction.  Patient inquired on if he could receive rx for diabetic shoes.  Verified that patient has not had a diabetic foot exam since 04/2012 and would need this before a new rx can be given.  Informed patient of this which upset patient.  He then asked why one was not done at last visit.  Offered to move up December appt to accommodate patient.  He refused and left office cursing.

## 2012-12-25 NOTE — Telephone Encounter (Signed)
This needs to be discussed with you, appropriate course of action to be taken and manner in which it be done. Profanity is , as far as I am aware, unacceptable I am available whenever you are, thanks

## 2012-12-28 NOTE — Progress Notes (Signed)
Message left for patient to let him know Dr. Salena Saner wants to see him before the end of the year to discuss renal doppler results.

## 2013-01-09 ENCOUNTER — Encounter: Payer: Self-pay | Admitting: Cardiovascular Disease

## 2013-01-09 ENCOUNTER — Ambulatory Visit (INDEPENDENT_AMBULATORY_CARE_PROVIDER_SITE_OTHER): Payer: Medicare Other | Admitting: Cardiovascular Disease

## 2013-01-09 VITALS — BP 122/62 | HR 61 | Resp 20 | Ht 69.0 in | Wt 165.0 lb

## 2013-01-09 DIAGNOSIS — I10 Essential (primary) hypertension: Secondary | ICD-10-CM

## 2013-01-09 DIAGNOSIS — E785 Hyperlipidemia, unspecified: Secondary | ICD-10-CM

## 2013-01-09 DIAGNOSIS — I739 Peripheral vascular disease, unspecified: Secondary | ICD-10-CM

## 2013-01-09 NOTE — Patient Instructions (Signed)
Your physician has requested that you have a lower extremity arterial exercise duplex. During this test, exercise and ultrasound are used to evaluate arterial blood flow in the legs. Allow one hour for this exam. There are no restrictions or special instructions.  Dr. Royann Shivers recommends that you schedule an appointment with Dr. Nanetta Batty for follow-up of your Renal Artery Stenosis and PAD.  Dr. Royann Shivers recommends that you schedule a follow-up appointment in: 12 months.

## 2013-01-12 DIAGNOSIS — I739 Peripheral vascular disease, unspecified: Secondary | ICD-10-CM | POA: Insufficient documentation

## 2013-01-12 NOTE — Assessment & Plan Note (Signed)
I think his leg pain is neuropathic and is more likely secondary to diabetes and ischemia, but cannot say for sure. We'll repeat a lower showed arterial duplex study. Smoking cessation is again strongly encouraged. We spent a long time discussing all the bad consequences of ongoing cigarette use on his circulation.

## 2013-01-12 NOTE — Assessment & Plan Note (Signed)
Excellent LDL control, but low HDL. Better glycemic control may help this improve a little bit.

## 2013-01-12 NOTE — Assessment & Plan Note (Signed)
Excellent blood pressure control after addition of aldosterone antagonist. No changes in medications are planned. If Dr. Allyson Sabal he thinks he needs renal artery stenting, this may lead to some reduction in blood pressure medication, but will not "cure" his hypertension.

## 2013-01-12 NOTE — Progress Notes (Signed)
Patient ID: Donald Everett, male   DOB: 1940-06-07, 72 y.o.   MRN: [id number]      Reason for office visit Renal artery stenosis, PAD, diabetes mellitus, hyperlipidemia  Donald Everett is a 72 year old man with known bilateral renal artery stenosis that has responded well to treatment with spinronolactone. Blood pressure control is good without worsening of renal function. A recent repeat duplex ultrasound shows worsening of the left renal artery stenosis, with systolic velocities now well in excess of 400 cm/s. Blood pressure control remains good.  He complains about bilateral leg pain, although it is hard to say whether he is truly describing intermittent claudication, since the symptoms often persist for a long time at rest. Some of the features of this pain suggests neuropathy. Previous ultrasound studies have shown a 70% distal right superficial femoral artery stenosis. Both legs hurt him now. He has treated diabetes mellitus, but his hemoglobin A1c has recently deteriorated. He is less compliant with dietary recommendations. He takes a statin for dyslipidemia, with excellent LDL cholesterol levels but with very low HDL. Unfortunately he continues to smoke although he thinks he has made a lot of progress with "cutting back". Nuclear myocardial perfusion imaging was normal in 2012.   No Known Allergies  Current Outpatient Prescriptions  Medication Sig Dispense Refill  . ACCU-CHEK AVIVA PLUS test strip USE TO TEST BLOOD SUGAR THREE TIMES DAILY  100 each  3  . Alcohol Swabs (B-D SINGLE USE SWABS REGULAR) PADS USE AS DIRECTED  300 each  3  . ALPRAZolam (XANAX) 0.5 MG tablet Take 1 tablet (0.5 mg total) by mouth at bedtime.  90 tablet  1  . amLODipine (NORVASC) 10 MG tablet Take 1 tablet (10 mg total) by mouth daily.  90 tablet  1  . aspirin 81 MG tablet Take 81 mg by mouth daily.      . Blood Glucose Monitoring Suppl (ACCU-CHEK AVIVA PLUS) W/DEVICE KIT 1 kit by Does not apply route daily.  1 kit   0  . cloNIDine (CATAPRES) 0.1 MG tablet One tablet once daily at bedtime.  90 tablet  1  . diclofenac sodium (VOLTAREN) 1 % GEL Apply twice daily as needed to affected area for pain  200 g  1  . fluticasone (FLONASE) 50 MCG/ACT nasal spray Place 1 spray into the nose daily.  15 g  2  . Fluticasone-Salmeterol (ADVAIR DISKUS) 100-50 MCG/DOSE AEPB Inhale 1 puff into the lungs 2 (two) times daily.  180 each  3  . gabapentin (NEURONTIN) 300 MG capsule One cap twice daily and 2 at bedtime  360 capsule  3  . HYDROcodone-acetaminophen (NORCO) 7.5-325 MG per tablet Take 1 tablet by mouth 2 (two) times daily as needed for pain.  60 tablet  0  . insulin aspart protamine- aspart (NOVOLOG MIX 70/30) (70-30) 100 UNIT/ML injection 35 units am and 20 units PM  PLEASE DISPENSE A 90 DAY SUPPLY  9 mL  3  . Insulin Syringe-Needle U-100 (B-D INS SYRINGE 0.5CC/31GX5/16) 31G X 5/16" 0.5 ML MISC Use as directed  300 each  3  . Lancets (ACCU-CHEK MULTICLIX) lancets USE TO TEST BLOOD SUGAR THREE TIMES DAILY  102 each  2  . losartan (COZAAR) 100 MG tablet Take 1 tablet (100 mg total) by mouth daily.  90 tablet  2  . metFORMIN (GLUCOPHAGE) 1000 MG tablet Take 1,000 mg by mouth 2 (two) times daily with a meal.      . omeprazole (PRILOSEC) 40 MG  capsule Take 40 mg by mouth daily.  90 capsule  1  . polyethylene glycol powder (MIRALAX) powder Put 17 grams in 8 ounces water every day  255 g  1  . pravastatin (PRAVACHOL) 20 MG tablet Take 1 tablet (20 mg total) by mouth every evening.  90 tablet  3  . sitaGLIPtin (JANUVIA) 100 MG tablet Take 100 mg by mouth daily.      Marland Kitchen spironolactone (ALDACTONE) 25 MG tablet Take 1 tablet (25 mg total) by mouth 2 (two) times daily. New directions  180 tablet  1  . tamsulosin (FLOMAX) 0.4 MG CAPS capsule Take 1 capsule (0.4 mg total) by mouth daily.  90 capsule  0  . tiotropium (SPIRIVA HANDIHALER) 18 MCG inhalation capsule Place 1 capsule (18 mcg total) into inhaler and inhale daily.  90 capsule   3   No current facility-administered medications for this visit.    Past Medical History  Diagnosis Date  . Allergy   . Anxiety   . Diabetes mellitus   . Hyperlipidemia   . Hypertension   . ED (erectile dysfunction)   . Nicotine addiction   . Chronic low back pain   . Insomnia   . COPD (chronic obstructive pulmonary disease)     No past surgical history on file.  No family history on file.  History   Social History  . Marital Status: Single    Spouse Name: N/A    Number of Children: N/A  . Years of Education: N/A   Occupational History  . Not on file.   Social History Main Topics  . Smoking status: Current Every Day Smoker -- 0.50 packs/day    Types: Cigarettes  . Smokeless tobacco: Not on file  . Alcohol Use: Yes     Comment: 2 beers a day  . Drug Use: No  . Sexual Activity: Not on file   Other Topics Concern  . Not on file   Social History Narrative  . No narrative on file    Review of systems: He complains of bilateral leg pain with walking but also at rest The patient specifically denies any chest pain at rest or with exertion, dyspnea at rest or with exertion, orthopnea, paroxysmal nocturnal dyspnea, syncope, palpitations, focal neurological deficits, lower extremity edema, unexplained weight gain, cough, hemoptysis or wheezing.  The patient also denies abdominal pain, nausea, vomiting, dysphagia, diarrhea, constipation, polyuria, polydipsia, dysuria, hematuria, frequency, urgency, abnormal bleeding or bruising, fever, chills, unexpected weight changes, mood swings, change in skin or hair texture, change in voice quality, auditory or visual problems, allergic reactions or rashes, new musculoskeletal complaints other than usual "aches and pains".   PHYSICAL EXAM BP 122/62  Pulse 61  Resp 20  Ht 5\' 9"  (1.753 m)  Wt 165 lb (74.844 kg)  BMI 24.36 kg/m2  General: Alert, oriented x3, no distress Head: no evidence of trauma, PERRL, EOMI, no exophtalmos  or lid lag, no myxedema, no xanthelasma; normal ears, nose and oropharynx Neck: normal jugular venous pulsations and no hepatojugular reflux; brisk carotid pulses without delay and soft bilateral carotid bruits Chest: clear to auscultation, no signs of consolidation by percussion or palpation, normal fremitus, symmetrical and full respiratory excursions Cardiovascular: normal position and quality of the apical impulse, regular rhythm, normal first and widely split second heart sounds, no murmurs, rubs or gallops Abdomen: no tenderness or distention, no masses by palpation, no abnormal pulsatility or arterial bruits, normal bowel sounds, no hepatosplenomegaly Extremities: no clubbing, cyanosis or  edema; 2+ radial, ulnar and brachial pulses bilaterally; 2+ right femoral, posterior tibial and dorsalis pedis pulses; 2+ left femoral, posterior tibial and dorsalis pedis pulses; bilateral femoral bruits are heard Neurological: grossly nonfocal   EKG: Normal sinus rhythm, right bundle branch block, left anterior fascicular block, first degree AV block 266 ms  Lipid Panel     Component Value Date/Time   CHOL 110 12/12/2012 0756   TRIG 132 12/12/2012 0756   HDL 27* 12/12/2012 0756   CHOLHDL 4.1 12/12/2012 0756   VLDL 26 12/12/2012 0756   LDLCALC 57 12/12/2012 0756    BMET    Component Value Date/Time   NA 128* 12/12/2012 0756   K 4.9 12/12/2012 0756   CL 98 12/12/2012 0756   CO2 25 12/12/2012 0756   GLUCOSE 167* 12/12/2012 0756   BUN 22 12/12/2012 0756   CREATININE 1.21 12/12/2012 0756   CREATININE 1.12 02/07/2012 1027   CALCIUM 9.8 12/12/2012 0756   GFRNONAA 64* 02/07/2012 1027   GFRAA 74* 02/07/2012 1027     ASSESSMENT AND PLAN HYPERLIPIDEMIA Excellent LDL control, but low HDL. Better glycemic control may help this improve a little bit.  Renovascular hypertension Excellent blood pressure control after addition of aldosterone antagonist. No changes in medications are planned. If  Dr. Allyson Sabal he thinks he needs renal artery stenting, this may lead to some reduction in blood pressure medication, but will not "cure" his hypertension.  PAD (peripheral artery disease) I think his leg pain is neuropathic and is more likely secondary to diabetes and ischemia, but cannot say for sure. We'll repeat a lower showed arterial duplex study. Smoking cessation is again strongly encouraged. We spent a long time discussing all the bad consequences of ongoing cigarette use on his circulation.   Patient Instructions  Your physician has requested that you have a lower extremity arterial exercise duplex. During this test, exercise and ultrasound are used to evaluate arterial blood flow in the legs. Allow one hour for this exam. There are no restrictions or special instructions.  Dr. Royann Shivers recommends that you schedule an appointment with Dr. Nanetta Batty for follow-up of your Renal Artery Stenosis and PAD.  Dr. Royann Shivers recommends that you schedule a follow-up appointment in: 12 months.      Orders Placed This Encounter  Procedures  . EKG 12-Lead  . Lower Extremity Arterial Duplex Bilateral   Meds ordered this encounter  Medications  . metFORMIN (GLUCOPHAGE) 1000 MG tablet    Sig: Take 1,000 mg by mouth 2 (two) times daily with a meal.  . sitaGLIPtin (JANUVIA) 100 MG tablet    Sig: Take 100 mg by mouth daily.  Marland Kitchen aspirin 81 MG tablet    Sig: Take 81 mg by mouth daily.    Junious Silk, MD, [facility] [phone] office [phone] pager

## 2013-01-14 ENCOUNTER — Encounter: Payer: Self-pay | Admitting: Cardiovascular Disease

## 2013-01-22 ENCOUNTER — Ambulatory Visit (HOSPITAL_COMMUNITY)
Admission: RE | Admit: 2013-01-22 | Discharge: 2013-01-22 | Disposition: A | Discharge status: Home / Self Care | Payer: Medicare Other | Source: Ambulatory Visit | Attending: Cardiovascular Disease | Admitting: Cardiovascular Disease

## 2013-01-22 DIAGNOSIS — I739 Peripheral vascular disease, unspecified: Secondary | ICD-10-CM

## 2013-01-22 DIAGNOSIS — I70209 Unspecified atherosclerosis of native arteries of extremities, unspecified extremity: Secondary | ICD-10-CM | POA: Insufficient documentation

## 2013-01-22 NOTE — Progress Notes (Signed)
Lower Extremity Arterial Duplex Completed. °Brianna L Mazza,RVT °

## 2013-01-24 ENCOUNTER — Encounter (INDEPENDENT_AMBULATORY_CARE_PROVIDER_SITE_OTHER): Payer: Self-pay

## 2013-01-24 ENCOUNTER — Encounter: Payer: Self-pay | Admitting: Family Medicine

## 2013-01-24 ENCOUNTER — Ambulatory Visit (INDEPENDENT_AMBULATORY_CARE_PROVIDER_SITE_OTHER): Payer: Medicare Other | Admitting: Family Medicine

## 2013-01-24 VITALS — BP 150/70 | HR 74 | Resp 18 | Ht 70.0 in | Wt 165.1 lb

## 2013-01-24 DIAGNOSIS — G4700 Insomnia, unspecified: Secondary | ICD-10-CM

## 2013-01-24 DIAGNOSIS — I150 Renovascular hypertension: Secondary | ICD-10-CM

## 2013-01-24 DIAGNOSIS — N529 Male erectile dysfunction, unspecified: Secondary | ICD-10-CM

## 2013-01-24 DIAGNOSIS — J309 Allergic rhinitis, unspecified: Secondary | ICD-10-CM

## 2013-01-24 DIAGNOSIS — E109 Type 1 diabetes mellitus without complications: Secondary | ICD-10-CM

## 2013-01-24 DIAGNOSIS — F17200 Nicotine dependence, unspecified, uncomplicated: Secondary | ICD-10-CM

## 2013-01-24 DIAGNOSIS — D518 Other vitamin B12 deficiency anemias: Secondary | ICD-10-CM

## 2013-01-24 DIAGNOSIS — E1159 Type 2 diabetes mellitus with other circulatory complications: Secondary | ICD-10-CM

## 2013-01-24 DIAGNOSIS — D519 Vitamin B12 deficiency anemia, unspecified: Secondary | ICD-10-CM

## 2013-01-24 DIAGNOSIS — J449 Chronic obstructive pulmonary disease, unspecified: Secondary | ICD-10-CM

## 2013-01-24 DIAGNOSIS — IMO0002 Reserved for concepts with insufficient information to code with codable children: Secondary | ICD-10-CM

## 2013-01-24 DIAGNOSIS — J302 Other seasonal allergic rhinitis: Secondary | ICD-10-CM

## 2013-01-24 MED ORDER — BENZONATATE 100 MG PO CAPS: 100.0000 mg | ORAL_CAPSULE | Freq: Three times a day (TID) | ORAL | Status: DC | PRN

## 2013-01-24 MED ORDER — CYANOCOBALAMIN 1000 MCG/ML IJ SOLN
1000.0000 ug | Freq: Once | INTRAMUSCULAR | Status: AC
  Administered 2013-01-24: 1000 ug via INTRAMUSCULAR

## 2013-01-24 NOTE — Progress Notes (Signed)
   Subjective:    Patient ID: Donald Everett, male    DOB: 1940-07-06, 72 y.o.   MRN: [id number]  HPI The PT is here for follow up and re-evaluation of chronic medical conditions, medication management and review of any available recent lab and radiology data. Specific need here today is re eval of his blood sugar which recently significantly deteriorated, as well as the need for s foot exam so that he can acquire necessary foot wear Preventive health is updated, specifically  Cancer screening and Immunization.   Recent vascular evaluation shows progression of disease both in renal as well as lower extremity vessels, pt aware of need to stop smoking is trying harder, and he has upcoming appt with cardiology next week The PT denies any adverse reactions to current medications since the last visit.  C/o excessive cough at night  and requests decongestant pills    Review of Systems See HPI Denies recent fever or chills. Denies sinus pressure, nasal congestion, ear pain or sore throat. Denies chest congestion, productive cough or wheezing. Denies chest pains, palpitations and leg swelling Denies abdominal pain, nausea, vomiting,diarrhea or constipation.   Denies dysuria, frequency, hesitancy or incontinence. Chronic back pain and limitation in mobility unchnaged Denies headaches or  seizures, has numbness, in lower extremities Denies uncontrolled  depression, anxiety or insomnia. Denies skin break down or rash.        Objective:   Physical Exam Patient alert and oriented and in no cardiopulmonary distress.  HEENT: No facial asymmetry, EOMI, no sinus tenderness,  oropharynx pink and moist.  Neck supple no adenopathy.  Chest: Clear to auscultation bilaterally.decreased though adequate  CVS: S1, S2 no murmurs, no S3.  ABD: Soft non tender. Bowel sounds normal.  Ext: No edema  MS: Adequate ROM spine, shoulders, hips and knees.  Skin: Intact, no ulcerations or rash  noted.  Psych: Good eye contact, normal affect. Memory intact not anxious or depressed appearing.  CNS: CN 2-12 intact, power,  normal throughout.        Assessment & Plan:

## 2013-01-24 NOTE — Patient Instructions (Addendum)
F/u in early Feb, call if you need me before  HBA1C, chem 7 and EGFR Jan 29 or after   Continue to work on stopping smoking , because you need to  Information for your shoes is ready to go, please check the pharmacy today    Cough capsules that you requested have been sent

## 2013-01-26 NOTE — Assessment & Plan Note (Signed)
Worsening due to ongoing nicotine use Cessation counseling done 

## 2013-01-26 NOTE — Assessment & Plan Note (Signed)
Meter shows improvement in readings, will follow HBA1C in Feb Pt reoports changing to improve his diet Foot exam today so that he can obtain shoes

## 2013-01-26 NOTE — Assessment & Plan Note (Signed)
unchanged despite good sleep hygiene , continue chronic med

## 2013-01-26 NOTE — Assessment & Plan Note (Signed)
increassed symptoms with cough worse at night , tessalon perles prescribed

## 2013-01-26 NOTE — Assessment & Plan Note (Signed)
States he is trying to cut back, no quit date set as yet Patient counseled for approximately 5 minutes regarding the health risks of ongoing nicotine use, specifically all types of cancer, heart disease, stroke and respiratory failure. The options available for help with cessation ,the behavioral changes to assist the process, and the option to either gradully reduce usage  Or abruptly stop.is also discussed. Pt is also encouraged to set specific goals in number of cigarettes used daily, as well as to set a quit date.

## 2013-01-26 NOTE — Assessment & Plan Note (Signed)
Recent card eval, pt may need stent placement and he is aware

## 2013-01-29 ENCOUNTER — Other Ambulatory Visit: Payer: Self-pay

## 2013-01-29 MED ORDER — OMEPRAZOLE 40 MG PO CPDR: DELAYED_RELEASE_CAPSULE | ORAL | Status: DC

## 2013-01-29 MED ORDER — AMLODIPINE BESYLATE 10 MG PO TABS: 10.0000 mg | ORAL_TABLET | Freq: Every day | ORAL | Status: DC

## 2013-01-29 MED ORDER — SPIRONOLACTONE 25 MG PO TABS: 25.0000 mg | ORAL_TABLET | Freq: Two times a day (BID) | ORAL | Status: DC

## 2013-01-31 ENCOUNTER — Encounter: Payer: Self-pay | Admitting: Cardiovascular Disease

## 2013-01-31 ENCOUNTER — Ambulatory Visit (INDEPENDENT_AMBULATORY_CARE_PROVIDER_SITE_OTHER): Payer: Medicare Other | Admitting: Cardiovascular Disease

## 2013-01-31 VITALS — BP 140/70 | HR 76 | Ht 70.0 in | Wt 166.0 lb

## 2013-01-31 DIAGNOSIS — D689 Coagulation defect, unspecified: Secondary | ICD-10-CM

## 2013-01-31 DIAGNOSIS — R5383 Other fatigue: Secondary | ICD-10-CM

## 2013-01-31 DIAGNOSIS — F17200 Nicotine dependence, unspecified, uncomplicated: Secondary | ICD-10-CM

## 2013-01-31 DIAGNOSIS — Z79899 Other long term (current) drug therapy: Secondary | ICD-10-CM

## 2013-01-31 DIAGNOSIS — Z01818 Encounter for other preprocedural examination: Secondary | ICD-10-CM

## 2013-01-31 DIAGNOSIS — R5381 Other malaise: Secondary | ICD-10-CM

## 2013-01-31 DIAGNOSIS — Z01812 Encounter for preprocedural laboratory examination: Secondary | ICD-10-CM

## 2013-01-31 DIAGNOSIS — I701 Atherosclerosis of renal artery: Secondary | ICD-10-CM | POA: Insufficient documentation

## 2013-01-31 DIAGNOSIS — I739 Peripheral vascular disease, unspecified: Secondary | ICD-10-CM

## 2013-01-31 DIAGNOSIS — Z720 Tobacco use: Secondary | ICD-10-CM

## 2013-01-31 NOTE — Patient Instructions (Signed)
Dr. Allyson Sabal has ordered a peripheral angiogram to be done at [facility].  This procedure is going to look at the bloodflow in your lower extremities.  If Dr. Allyson Sabal is able to open up the arteries, you will have to spend one night in the hospital.  If he is not able to open the arteries, you will be able to go home that same day.    After the procedure, you will not be allowed to drive for 3 days or push, pull, or lift anything greater than 10 lbs for one week.    You will be required to have bloodwork and a chest xray prior to your procedure.  Our scheduler will advise you on when these items need to be done.     Rep Minerva Areola

## 2013-01-31 NOTE — Assessment & Plan Note (Signed)
Patient has a history of bilateral lower extremity claudication lifestyle limiting. He had Doppler studies performed in our office 01/22/13 revealing a right ABI of 0.7 appears to be a subtotally occluded distal right SFA and a left ABI of 0.9 with a high-frequency signal in the left common femoral artery. I'm going to arrange for him to undergo angiography and potential endovascular therapy for lifestyle limiting claudication.

## 2013-01-31 NOTE — Progress Notes (Signed)
01/31/2013 Donald Everett   Jun 19, 1940  [id number]  Primary Physician Syliva Overman, MD Primary Cardiologist: Runell Gess MD Roseanne Reno   HPI:  Mr. Creger is a 72 year old man with known bilateral renal artery stenosis that has responded well to treatment with spinronolactone. Blood pressure control is good without worsening of renal function. A recent repeat duplex ultrasound shows worsening of the left renal artery stenosis, with systolic velocities now well in excess of 400 cm/s. Blood pressure control remains good.  He complains about bilateral leg pain, although it is hard to say whether he is truly describing intermittent claudication, since the symptoms often persist for a long time at rest. Some of the features of this pain suggests neuropathy. Previous ultrasound studies have shown a 70% distal right superficial femoral artery stenosis. Both legs hurt him now.  He has treated diabetes mellitus, but his hemoglobin A1c has recently deteriorated. He is less compliant with dietary recommendations. He takes a statin for dyslipidemia, with excellent LDL cholesterol levels but with very low HDL. Unfortunately he continues to smoke although he thinks he has made a lot of progress with "cutting back". Nuclear myocardial perfusion imaging was normal in 2012. The patient was referred to me by Dr. Chauncy Lean for peripheral vascular evaluation. He does have Lescol limiting claudication with recent Dopplers performed/9-14 suggesting a right ABI 0.7 with a subtotally occluded distal right SFA and a left ABI 0.9 with a high-frequency signal in the left common femoral artery. I'm going to arrange for the patient to undergo angiography and potential percutaneous intervention for lifestyle limiting claudication. He also has hypertension on multiple antihypertensive medications and renal Doppler study this suggests high-grade left renal artery stenosis which may be  contributory.    Current Outpatient Prescriptions  Medication Sig Dispense Refill  . ACCU-CHEK AVIVA PLUS test strip USE TO TEST BLOOD SUGAR THREE TIMES DAILY  100 each  3  . Alcohol Swabs (B-D SINGLE USE SWABS REGULAR) PADS USE AS DIRECTED  300 each  3  . ALPRAZolam (XANAX) 0.5 MG tablet Take 1 tablet (0.5 mg total) by mouth at bedtime.  90 tablet  1  . amLODipine (NORVASC) 10 MG tablet Take 1 tablet (10 mg total) by mouth daily.  90 tablet  1  . aspirin 81 MG tablet Take 81 mg by mouth daily.      . benzonatate (TESSALON) 100 MG capsule Take 1 capsule (100 mg total) by mouth 3 (three) times daily as needed for cough.  30 capsule  2  . Blood Glucose Monitoring Suppl (ACCU-CHEK AVIVA PLUS) W/DEVICE KIT 1 kit by Does not apply route daily.  1 kit  0  . cloNIDine (CATAPRES) 0.1 MG tablet One tablet once daily at bedtime.  90 tablet  1  . diclofenac sodium (VOLTAREN) 1 % GEL Apply twice daily as needed to affected area for pain  200 g  1  . fluticasone (FLONASE) 50 MCG/ACT nasal spray Place 1 spray into the nose daily.  15 g  2  . Fluticasone-Salmeterol (ADVAIR DISKUS) 100-50 MCG/DOSE AEPB Inhale 1 puff into the lungs 2 (two) times daily.  180 each  3  . gabapentin (NEURONTIN) 300 MG capsule One cap twice daily and 2 at bedtime  360 capsule  3  . HYDROcodone-acetaminophen (NORCO) 7.5-325 MG per tablet Take 1 tablet by mouth 2 (two) times daily as needed for pain.  60 tablet  0  . insulin aspart protamine- aspart (NOVOLOG MIX 70/30) (  70-30) 100 UNIT/ML injection 35 units am and 20 units PM  PLEASE DISPENSE A 90 DAY SUPPLY  9 mL  3  . Insulin Syringe-Needle U-100 (B-D INS SYRINGE 0.5CC/31GX5/16) 31G X 5/16" 0.5 ML MISC Use as directed  300 each  3  . Lancets (ACCU-CHEK MULTICLIX) lancets USE TO TEST BLOOD SUGAR THREE TIMES DAILY  102 each  2  . losartan (COZAAR) 100 MG tablet Take 1 tablet (100 mg total) by mouth daily.  90 tablet  2  . metFORMIN (GLUCOPHAGE) 1000 MG tablet Take 1,000 mg by mouth  2 (two) times daily with a meal.      . omeprazole (PRILOSEC) 40 MG capsule Take 40 mg by mouth daily.  90 capsule  1  . polyethylene glycol powder (MIRALAX) powder Put 17 grams in 8 ounces water every day  255 g  1  . pravastatin (PRAVACHOL) 20 MG tablet Take 1 tablet (20 mg total) by mouth every evening.  90 tablet  3  . sitaGLIPtin (JANUVIA) 100 MG tablet Take 100 mg by mouth daily.      Marland Kitchen spironolactone (ALDACTONE) 25 MG tablet Take 1 tablet (25 mg total) by mouth 2 (two) times daily. New directions  180 tablet  1  . tamsulosin (FLOMAX) 0.4 MG CAPS capsule Take 1 capsule (0.4 mg total) by mouth daily.  90 capsule  0  . tiotropium (SPIRIVA HANDIHALER) 18 MCG inhalation capsule Place 1 capsule (18 mcg total) into inhaler and inhale daily.  90 capsule  3   No current facility-administered medications for this visit.    No Known Allergies  History   Social History  . Marital Status: Single    Spouse Name: N/A    Number of Children: N/A  . Years of Education: N/A   Occupational History  . Not on file.   Social History Main Topics  . Smoking status: Current Every Day Smoker -- 0.50 packs/day    Types: Cigarettes  . Smokeless tobacco: Not on file  . Alcohol Use: Yes     Comment: 2 beers a day  . Drug Use: No  . Sexual Activity: Not on file   Other Topics Concern  . Not on file   Social History Narrative  . No narrative on file     Review of Systems: General: negative for chills, fever, night sweats or weight changes.  Cardiovascular: negative for chest pain, dyspnea on exertion, edema, orthopnea, palpitations, paroxysmal nocturnal dyspnea or shortness of breath Dermatological: negative for rash Respiratory: negative for cough or wheezing Urologic: negative for hematuria Abdominal: negative for nausea, vomiting, diarrhea, bright red blood per rectum, melena, or hematemesis Neurologic: negative for visual changes, syncope, or dizziness All other systems reviewed and are  otherwise negative except as noted above.    Blood pressure 140/70, pulse 76, height 5\' 10"  (1.778 m), weight 166 lb (75.297 kg).  General appearance: alert and no distress Neck: no adenopathy, no carotid bruit, no JVD, supple, symmetrical, trachea midline and thyroid not enlarged, symmetric, no tenderness/mass/nodules Lungs: clear to auscultation bilaterally Heart: regular rate and rhythm, S1, S2 normal, no murmur, click, rub or gallop Extremities: extremities normal, atraumatic, no cyanosis or edema  EKG not performed today  ASSESSMENT AND PLAN:   PAD (peripheral artery disease) Patient has a history of bilateral lower extremity claudication lifestyle limiting. He had Doppler studies performed in our office 01/22/13 revealing a right ABI of 0.7 appears to be a subtotally occluded distal right SFA and a left  ABI of 0.9 with a high-frequency signal in the left common femoral artery. I'm going to arrange for him to undergo angiography and potential endovascular therapy for lifestyle limiting claudication.  Renal artery stenosis The patient does have hypertension on 4 antihypertensive medications which is adequately controlled. Renal Dopplers performed on 12/19/12 suggested high-grade left renal artery stenosis with preserved renal dimension. At the time of angiography we will image his renal arteries and consider renal intervention for treatment of renal vascular hypertension.      Runell Gess MD FACP,FACC,FAHA, [facility] 01/31/2013 2:13 PM

## 2013-01-31 NOTE — Assessment & Plan Note (Signed)
The patient does have hypertension on 4 antihypertensive medications which is adequately controlled. Renal Dopplers performed on 12/19/12 suggested high-grade left renal artery stenosis with preserved renal dimension. At the time of angiography we will image his renal arteries and consider renal intervention for treatment of renal vascular hypertension.

## 2013-02-12 ENCOUNTER — Telehealth: Payer: Self-pay | Admitting: *Deleted

## 2013-02-12 NOTE — Telephone Encounter (Signed)
Pt called to get 3 refills, omeprazole, amlodipine, spironolact. Please advise [phone]

## 2013-02-12 NOTE — Telephone Encounter (Signed)
Left message that those meds were refilled on 12/16 to [facility] electronically and to call back if they need to be resent

## 2013-02-19 ENCOUNTER — Encounter (HOSPITAL_COMMUNITY): Payer: Self-pay | Admitting: Pharmacy Technician

## 2013-02-19 ENCOUNTER — Ambulatory Visit
Admission: RE | Admit: 2013-02-19 | Discharge: 2013-02-19 | Disposition: A | Discharge status: Home / Self Care | Payer: Medicare Other | Source: Ambulatory Visit | Attending: Cardiovascular Disease | Admitting: Cardiovascular Disease

## 2013-02-19 DIAGNOSIS — Z720 Tobacco use: Secondary | ICD-10-CM

## 2013-02-19 DIAGNOSIS — Z01812 Encounter for preprocedural laboratory examination: Secondary | ICD-10-CM

## 2013-02-19 LAB — BASIC METABOLIC PANEL
BUN: 23 mg/dL (ref 6–23)
CO2: 26 mEq/L (ref 19–32)
Calcium: 9.3 mg/dL (ref 8.4–10.5)
Chloride: 94 mEq/L — ABNORMAL LOW (ref 96–112)
Creat: 1.38 mg/dL — ABNORMAL HIGH (ref 0.50–1.35)
Glucose, Bld: 139 mg/dL — ABNORMAL HIGH (ref 70–99)
Potassium: 5.7 mEq/L — ABNORMAL HIGH (ref 3.5–5.3)
Sodium: 129 mEq/L — ABNORMAL LOW (ref 135–145)

## 2013-02-19 LAB — PROTIME-INR
INR: 1.02 (ref <1.50)
Prothrombin Time: 13.3 seconds (ref 11.6–15.2)

## 2013-02-19 LAB — APTT: aPTT: 34 seconds (ref 24–37)

## 2013-02-20 LAB — CBC
HCT: 34.5 % — ABNORMAL LOW (ref 39.0–52.0)
Hemoglobin: 11.9 g/dL — ABNORMAL LOW (ref 13.0–17.0)
MCH: 29.9 pg (ref 26.0–34.0)
MCHC: 34.5 g/dL (ref 30.0–36.0)
MCV: 86.7 fL (ref 78.0–100.0)
Platelets: 299 10*3/uL (ref 150–400)
RBC: 3.98 MIL/uL — ABNORMAL LOW (ref 4.22–5.81)
RDW: 13.7 % (ref 11.5–15.5)
WBC: 9.7 10*3/uL (ref 4.0–10.5)

## 2013-02-20 LAB — TSH: TSH: 2.199 u[IU]/mL (ref 0.350–4.500)

## 2013-02-25 ENCOUNTER — Ambulatory Visit (HOSPITAL_COMMUNITY)
Admission: RE | Admit: 2013-02-25 | Discharge: 2013-02-26 | Disposition: A | Discharge status: Home / Self Care | Payer: Medicare Other | Source: Ambulatory Visit | Attending: Cardiovascular Disease | Admitting: Cardiovascular Disease

## 2013-02-25 ENCOUNTER — Encounter: Payer: Self-pay | Admitting: *Deleted

## 2013-02-25 ENCOUNTER — Encounter (HOSPITAL_COMMUNITY)
Admission: RE | Disposition: A | Discharge status: Home / Self Care | Payer: Self-pay | Source: Ambulatory Visit | Attending: Cardiovascular Disease

## 2013-02-25 ENCOUNTER — Encounter (HOSPITAL_COMMUNITY): Payer: Self-pay | Admitting: Physician Assistant

## 2013-02-25 DIAGNOSIS — I70219 Atherosclerosis of native arteries of extremities with intermittent claudication, unspecified extremity: Secondary | ICD-10-CM

## 2013-02-25 DIAGNOSIS — Z01818 Encounter for other preprocedural examination: Secondary | ICD-10-CM

## 2013-02-25 DIAGNOSIS — E119 Type 2 diabetes mellitus without complications: Secondary | ICD-10-CM | POA: Insufficient documentation

## 2013-02-25 DIAGNOSIS — I441 Atrioventricular block, second degree: Secondary | ICD-10-CM | POA: Diagnosis not present

## 2013-02-25 DIAGNOSIS — I701 Atherosclerosis of renal artery: Secondary | ICD-10-CM | POA: Insufficient documentation

## 2013-02-25 DIAGNOSIS — I739 Peripheral vascular disease, unspecified: Secondary | ICD-10-CM | POA: Diagnosis present

## 2013-02-25 DIAGNOSIS — E785 Hyperlipidemia, unspecified: Secondary | ICD-10-CM | POA: Diagnosis present

## 2013-02-25 DIAGNOSIS — M79672 Pain in left foot: Secondary | ICD-10-CM

## 2013-02-25 HISTORY — PX: FEMORAL ARTERY STENT: SHX1583

## 2013-02-25 HISTORY — PX: LOWER EXTREMITY ANGIOGRAM: SHX5508

## 2013-02-25 HISTORY — DX: Unspecified osteoarthritis, unspecified site: M19.90

## 2013-02-25 HISTORY — DX: Gastro-esophageal reflux disease without esophagitis: K21.9

## 2013-02-25 HISTORY — DX: Type 2 diabetes mellitus without complications: E11.9

## 2013-02-25 LAB — BASIC METABOLIC PANEL
BUN: 28 mg/dL — AB (ref 6–23)
CALCIUM: 9 mg/dL (ref 8.4–10.5)
CO2: 24 meq/L (ref 19–32)
CREATININE: 1.15 mg/dL (ref 0.50–1.35)
Chloride: 91 mEq/L — ABNORMAL LOW (ref 96–112)
GFR calc Af Amer: 72 mL/min — ABNORMAL LOW (ref >90)
GFR, EST NON AFRICAN AMERICAN: 62 mL/min — AB (ref >90)
GLUCOSE: 176 mg/dL — AB (ref 70–99)
Potassium: 5.3 mEq/L (ref 3.7–5.3)
Sodium: 127 mEq/L — ABNORMAL LOW (ref 137–147)

## 2013-02-25 LAB — GLUCOSE, CAPILLARY
GLUCOSE-CAPILLARY: 537 mg/dL — AB (ref 70–99)
Glucose-Capillary: 244 mg/dL — ABNORMAL HIGH (ref 70–99)

## 2013-02-25 LAB — GLUCOSE, RANDOM: Glucose, Bld: 525 mg/dL — ABNORMAL HIGH (ref 70–99)

## 2013-02-25 LAB — POCT ACTIVATED CLOTTING TIME: ACTIVATED CLOTTING TIME: 166 s

## 2013-02-25 SURGERY — ANGIOGRAM, LOWER EXTREMITY
Anesthesia: LOCAL | Laterality: Right

## 2013-02-25 MED ORDER — ASPIRIN EC 325 MG PO TBEC
325.0000 mg | DELAYED_RELEASE_TABLET | Freq: Every day | ORAL | Status: DC
  Administered 2013-02-25 – 2013-02-26 (×2): 325 mg via ORAL
  Filled 2013-02-25 (×2): qty 1

## 2013-02-25 MED ORDER — MORPHINE SULFATE 2 MG/ML IJ SOLN: 1.0000 mg | INTRAMUSCULAR | Status: DC | PRN

## 2013-02-25 MED ORDER — HYDRALAZINE HCL 20 MG/ML IJ SOLN: 10.0000 mg | INTRAMUSCULAR | Status: DC | PRN

## 2013-02-25 MED ORDER — CLOPIDOGREL BISULFATE 75 MG PO TABS
75.0000 mg | ORAL_TABLET | Freq: Every day | ORAL | Status: DC
  Administered 2013-02-26: 08:00:00 75 mg via ORAL
  Filled 2013-02-25: qty 1

## 2013-02-25 MED ORDER — ALPRAZOLAM 0.5 MG PO TABS
0.5000 mg | ORAL_TABLET | Freq: Every day | ORAL | Status: DC
  Filled 2013-02-25: qty 1

## 2013-02-25 MED ORDER — TIOTROPIUM BROMIDE MONOHYDRATE 18 MCG IN CAPS
18.0000 ug | ORAL_CAPSULE | Freq: Every day | RESPIRATORY_TRACT | Status: DC | PRN
  Filled 2013-02-25: qty 5

## 2013-02-25 MED ORDER — BENZONATATE 100 MG PO CAPS
100.0000 mg | ORAL_CAPSULE | Freq: Three times a day (TID) | ORAL | Status: DC | PRN
  Filled 2013-02-25: qty 1

## 2013-02-25 MED ORDER — SODIUM CHLORIDE 0.9 % IV SOLN
INTRAVENOUS | Status: DC
  Administered 2013-02-25: 09:00:00 via INTRAVENOUS

## 2013-02-25 MED ORDER — ALPRAZOLAM 0.5 MG PO TABS
0.5000 mg | ORAL_TABLET | Freq: Three times a day (TID) | ORAL | Status: DC | PRN
  Administered 2013-02-25 – 2013-02-26 (×2): 0.5 mg via ORAL
  Filled 2013-02-25 (×2): qty 1

## 2013-02-25 MED ORDER — SODIUM CHLORIDE 0.9 % IV SOLN
INTRAVENOUS | Status: AC
  Administered 2013-02-25: 13:00:00 via INTRAVENOUS

## 2013-02-25 MED ORDER — PANTOPRAZOLE SODIUM 40 MG PO TBEC
40.0000 mg | DELAYED_RELEASE_TABLET | Freq: Every day | ORAL | Status: DC
  Administered 2013-02-25 – 2013-02-26 (×2): 40 mg via ORAL
  Filled 2013-02-25 (×2): qty 1

## 2013-02-25 MED ORDER — AMLODIPINE BESYLATE 10 MG PO TABS
10.0000 mg | ORAL_TABLET | Freq: Every day | ORAL | Status: DC
  Administered 2013-02-25 – 2013-02-26 (×2): 10 mg via ORAL
  Filled 2013-02-25 (×2): qty 1

## 2013-02-25 MED ORDER — LINAGLIPTIN 5 MG PO TABS
5.0000 mg | ORAL_TABLET | Freq: Every day | ORAL | Status: DC
  Administered 2013-02-25 – 2013-02-26 (×2): 5 mg via ORAL
  Filled 2013-02-25 (×2): qty 1

## 2013-02-25 MED ORDER — HEPARIN (PORCINE) IN NACL 2-0.9 UNIT/ML-% IJ SOLN
INTRAMUSCULAR | Status: AC
  Filled 2013-02-25: qty 1000

## 2013-02-25 MED ORDER — LOSARTAN POTASSIUM 50 MG PO TABS
100.0000 mg | ORAL_TABLET | Freq: Every day | ORAL | Status: DC
  Administered 2013-02-25 – 2013-02-26 (×2): 100 mg via ORAL
  Filled 2013-02-25 (×2): qty 2

## 2013-02-25 MED ORDER — MOMETASONE FURO-FORMOTEROL FUM 100-5 MCG/ACT IN AERO
2.0000 | INHALATION_SPRAY | Freq: Two times a day (BID) | RESPIRATORY_TRACT | Status: DC
  Administered 2013-02-25 – 2013-02-26 (×2): 2 via RESPIRATORY_TRACT
  Filled 2013-02-25: qty 8.8

## 2013-02-25 MED ORDER — POLYETHYLENE GLYCOL 3350 17 G PO PACK
17.0000 g | PACK | Freq: Every day | ORAL | Status: DC
  Administered 2013-02-25: 17 g via ORAL
  Filled 2013-02-25 (×2): qty 1

## 2013-02-25 MED ORDER — SPIRONOLACTONE 50 MG PO TABS
50.0000 mg | ORAL_TABLET | Freq: Two times a day (BID) | ORAL | Status: DC
  Administered 2013-02-25 – 2013-02-26 (×3): 50 mg via ORAL
  Filled 2013-02-25 (×4): qty 1

## 2013-02-25 MED ORDER — HYDROCODONE-ACETAMINOPHEN 7.5-325 MG PO TABS
1.0000 | ORAL_TABLET | ORAL | Status: DC | PRN
  Administered 2013-02-25: 1 via ORAL
  Filled 2013-02-25: qty 1

## 2013-02-25 MED ORDER — HEPARIN (PORCINE) IN NACL 2-0.9 UNIT/ML-% IJ SOLN
INTRAMUSCULAR | Status: AC
  Filled 2013-02-25: qty 500

## 2013-02-25 MED ORDER — HYDRALAZINE HCL 20 MG/ML IJ SOLN
INTRAMUSCULAR | Status: AC
  Administered 2013-02-25: 16:00:00 20 mg
  Filled 2013-02-25: qty 1

## 2013-02-25 MED ORDER — FAMOTIDINE IN NACL 20-0.9 MG/50ML-% IV SOLN
INTRAVENOUS | Status: AC
  Filled 2013-02-25: qty 50

## 2013-02-25 MED ORDER — FENTANYL CITRATE 0.05 MG/ML IJ SOLN
INTRAMUSCULAR | Status: AC
  Filled 2013-02-25: qty 2

## 2013-02-25 MED ORDER — LIDOCAINE HCL (PF) 1 % IJ SOLN
INTRAMUSCULAR | Status: AC
  Filled 2013-02-25: qty 30

## 2013-02-25 MED ORDER — ASPIRIN EC 325 MG PO TBEC: 325.0000 mg | DELAYED_RELEASE_TABLET | Freq: Every day | ORAL | Status: DC

## 2013-02-25 MED ORDER — INSULIN ASPART PROT & ASPART (70-30 MIX) 100 UNIT/ML ~~LOC~~ SUSP
10.0000 [IU] | Freq: Every day | SUBCUTANEOUS | Status: DC
  Administered 2013-02-25: 20 [IU] via SUBCUTANEOUS

## 2013-02-25 MED ORDER — ASPIRIN 81 MG PO TABS: 81.0000 mg | ORAL_TABLET | Freq: Every day | ORAL | Status: DC

## 2013-02-25 MED ORDER — GABAPENTIN 300 MG PO CAPS
600.0000 mg | ORAL_CAPSULE | Freq: Two times a day (BID) | ORAL | Status: DC
  Administered 2013-02-25 – 2013-02-26 (×3): 600 mg via ORAL
  Filled 2013-02-25 (×4): qty 2

## 2013-02-25 MED ORDER — CLOPIDOGREL BISULFATE 300 MG PO TABS
ORAL_TABLET | ORAL | Status: AC
  Filled 2013-02-25: qty 1

## 2013-02-25 MED ORDER — FLUTICASONE PROPIONATE 50 MCG/ACT NA SUSP
1.0000 | Freq: Every day | NASAL | Status: DC
  Filled 2013-02-25: qty 16

## 2013-02-25 MED ORDER — ASPIRIN 81 MG PO CHEW
81.0000 mg | CHEWABLE_TABLET | ORAL | Status: AC
  Administered 2013-02-25: 81 mg via ORAL
  Filled 2013-02-25: qty 1

## 2013-02-25 MED ORDER — ASPIRIN 81 MG PO CHEW: 81.0000 mg | CHEWABLE_TABLET | Freq: Every day | ORAL | Status: DC

## 2013-02-25 MED ORDER — INSULIN ASPART PROT & ASPART (70-30 MIX) 100 UNIT/ML ~~LOC~~ SUSP
35.0000 [IU] | Freq: Every day | SUBCUTANEOUS | Status: DC
  Administered 2013-02-26: 20 [IU] via SUBCUTANEOUS
  Filled 2013-02-25: qty 10

## 2013-02-25 MED ORDER — TAMSULOSIN HCL 0.4 MG PO CAPS
0.4000 mg | ORAL_CAPSULE | Freq: Every day | ORAL | Status: DC
  Administered 2013-02-25 – 2013-02-26 (×2): 0.4 mg via ORAL
  Filled 2013-02-25 (×2): qty 1

## 2013-02-25 MED ORDER — HEPARIN SODIUM (PORCINE) 1000 UNIT/ML IJ SOLN
INTRAMUSCULAR | Status: AC
  Filled 2013-02-25: qty 1

## 2013-02-25 MED ORDER — SODIUM CHLORIDE 0.9 % IJ SOLN: 3.0000 mL | INTRAMUSCULAR | Status: DC | PRN

## 2013-02-25 MED ORDER — MIDAZOLAM HCL 2 MG/2ML IJ SOLN
INTRAMUSCULAR | Status: AC
  Filled 2013-02-25: qty 2

## 2013-02-25 MED ORDER — DIAZEPAM 5 MG PO TABS
5.0000 mg | ORAL_TABLET | ORAL | Status: AC
  Administered 2013-02-25: 5 mg via ORAL
  Filled 2013-02-25: qty 1

## 2013-02-25 MED ORDER — NITROGLYCERIN IN D5W 200-5 MCG/ML-% IV SOLN
INTRAVENOUS | Status: AC
  Filled 2013-02-25: qty 250

## 2013-02-25 MED ORDER — ACETAMINOPHEN 325 MG PO TABS
650.0000 mg | ORAL_TABLET | ORAL | Status: DC | PRN
Start: 2013-02-25 — End: 2013-02-26

## 2013-02-25 MED ORDER — NICOTINE 14 MG/24HR TD PT24
14.0000 mg | MEDICATED_PATCH | Freq: Every day | TRANSDERMAL | Status: DC
  Administered 2013-02-25 – 2013-02-26 (×2): 14 mg via TRANSDERMAL
  Filled 2013-02-25 (×2): qty 1

## 2013-02-25 MED ORDER — ONDANSETRON HCL 4 MG/2ML IJ SOLN: 4.0000 mg | Freq: Four times a day (QID) | INTRAMUSCULAR | Status: DC | PRN

## 2013-02-25 MED ORDER — VERAPAMIL HCL 2.5 MG/ML IV SOLN
INTRAVENOUS | Status: AC
  Filled 2013-02-25: qty 2

## 2013-02-25 MED ORDER — CLONIDINE HCL 0.1 MG PO TABS
0.1000 mg | ORAL_TABLET | Freq: Every day | ORAL | Status: DC
  Filled 2013-02-25: qty 1

## 2013-02-25 NOTE — Progress Notes (Signed)
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 No symptoms. Advised RN I would d/c clonidine, no other rate-lowering medications on med list. 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 Advised her to call for symptomatic bradycardia, hypotension. Otherwise, continue to monitor.

## 2013-02-25 NOTE — CV Procedure (Signed)
Donald Everett is a 73 y.o. male    [id number] LOCATION:  FACILITY: [facility]  PHYSICIAN: Nanetta BattyJonathan Karman Biswell, M.D. 09/14/1940   DATE OF PROCEDURE:  02/25/2013  DATE OF DISCHARGE:     PV Angiogram/Intervention    History obtained from chart review.Donald Everett is a 73 year old man with known bilateral renal artery stenosis that has responded well to treatment with spinronolactone. Blood pressure control is good without worsening of renal function. A recent repeat duplex ultrasound shows worsening of the left renal artery stenosis, with systolic velocities now well in excess of 400 cm/s. Blood pressure control remains good.  He complains about bilateral leg pain, although it is hard to say whether he is truly describing intermittent claudication, since the symptoms often persist for a long time at rest. Some of the features of this pain suggests neuropathy. Previous ultrasound studies have shown a 70% distal right superficial femoral artery stenosis. Both legs hurt him now.  He has treated diabetes mellitus, but his hemoglobin A1c has recently deteriorated. He is less compliant with dietary recommendations. He takes a statin for dyslipidemia, with excellent LDL cholesterol levels but with very low HDL. Unfortunately he continues to smoke although he thinks he has made a lot of progress with "cutting back". Nuclear myocardial perfusion imaging was normal in 2012.    PROCEDURE DESCRIPTION:   The patient was brought to the second floor [facility] lab in the postabsorptive state. He was premedicated with Valium 5 mg by mouth, IV Versed and fentanyl. His left groin was prepped and shaved in usual sterile fashion. Xylocaine 1% was used for local anesthesia. A 5 French sheath was inserted into the left common femoral  artery using standard Seldinger technique.a 5 French pigtail catheter was placed at the level of the renal arteries an abdominal aortogram and bilateral iliac angiogram and  bifemoral runoff was performed using digital subtraction bolus chase settable technique. Selective right and left renal interventions were also performed. Visipaque dye was used for the entirety of the case. Retrograde aortic pressure was monitored during the case.   HEMODYNAMICS:    AO SYSTOLIC/AO DIASTOLIC: 145/49   Angiographic Data:   1: Abdominal aortogram-70% bilateral renal artery stenosis. The infrarenal abdominal aorta was free of significant atherosclerotic changes  2: Left lower extremity-there may have been a distal common femoral lesion he'll on the left however the profunda femoris crossover and obscured visualization. There was 40% focal mid stenosis with three-vessel runoff. There was 90% segmental proximal inserted and posterior tibial  3: Right lower extremity-50% followed by 60 per percent focal proximal right SFA stenosis. There was a 99% calcified mid right SFA stenosis followed by tandem 60% stenoses in the distal SFA and above the knee popliteal. There was one vessel runoff via the posterior tibial with a 90% tibioperoneal trunk.  IMPRESSION:Donald Everett has high-grade calcified mid right SFA stenosis.he also may have a distal left common femoral artery stenosis however this was not well visualized. We'll proceed with diamondback orbital rotational  atherectomy and drug-eluting balloon angioplasty.  Procedure Description:contralateral access was obtained with a crossover catheter and a 7 French 55 cm Ansel sheath. Total contrast used during the case was 217 cc. 7000 units of heparin was administered with an ACT of 238. The lesion was crossed with an 014/300 cm long Rigali a wire within a Quickcross endhole catheter. This was then switched out for a no 14 viper wire. A 6 mm NAV 6 distal protection device was then deployed over  the viper wire in the above-the-knee popliteal artery. Orbital atherectomy was performed with a 2 mm solid Crown at up to 120,000 rpm's. Following this a  Lutonix  5 mm x 4 cm long drug eluding the balloon was then used to perform angioplasty of the atherectomized site. The final angiographic result reduction of a 99% calcified mid right SFA stenosis to less than 10% residual without dissection. Appreciate the opportunity performed. The distal protection device was retrieved. The sheath was then withdrawn across the bifurcation and exchanged over an 035 wire for a short 7 [place] sheath. The patient was given 300 milligrams of by mouth Plavix as well as Pepcid.  Final Impression: successful diamondback orbital rotational atherectomy, PTA using drug-eluting balloon of high-grade calcified mid right SFA stenosis for lifestyle limiting claudication. The patient will be hydrated overnight. The sheath will be removed once the ACT falls below 170. He went to a full antibiotic therapy and discharged home in the morning. We'll get followup Dopplers and I will see him back in the office. We may stage left common femoral intervention.    Runell Gess MD, [facility] 02/25/2013 12:44 PM

## 2013-02-25 NOTE — Progress Notes (Signed)
Site area: left groin  Site Prior to Removal:  Level 0  Pressure Applied For 20 MINUTES    Minutes Beginning at 1830  Manual:   yes  Patient Status During Pull:  AAO X 4  Post Pull Groin Site:  Level 0  Post Pull Instructions Given:  yes  Post Pull Pulses Present:  yes  Dressing Applied:  yes  Comments:  Tolerated procedure well

## 2013-02-26 ENCOUNTER — Other Ambulatory Visit: Payer: Self-pay | Admitting: Physician Assistant

## 2013-02-26 DIAGNOSIS — I441 Atrioventricular block, second degree: Secondary | ICD-10-CM | POA: Diagnosis not present

## 2013-02-26 DIAGNOSIS — I739 Peripheral vascular disease, unspecified: Secondary | ICD-10-CM

## 2013-02-26 DIAGNOSIS — M79609 Pain in unspecified limb: Secondary | ICD-10-CM

## 2013-02-26 LAB — BASIC METABOLIC PANEL
BUN: 29 mg/dL — AB (ref 6–23)
CHLORIDE: 96 meq/L (ref 96–112)
CO2: 21 mEq/L (ref 19–32)
Calcium: 8.9 mg/dL (ref 8.4–10.5)
Creatinine, Ser: 1.38 mg/dL — ABNORMAL HIGH (ref 0.50–1.35)
GFR calc Af Amer: 57 mL/min — ABNORMAL LOW (ref >90)
GFR calc non Af Amer: 50 mL/min — ABNORMAL LOW (ref >90)
GLUCOSE: 192 mg/dL — AB (ref 70–99)
Potassium: 4.7 mEq/L (ref 3.7–5.3)
Sodium: 131 mEq/L — ABNORMAL LOW (ref 137–147)

## 2013-02-26 LAB — CBC
HEMATOCRIT: 40.5 % (ref 39.0–52.0)
HEMOGLOBIN: 10.5 g/dL — AB (ref 13.0–17.0)
MCH: 30 pg (ref 26.0–34.0)
MCHC: 25.9 g/dL — AB (ref 30.0–36.0)
MCV: 115.7 fL — AB (ref 78.0–100.0)
Platelets: 202 10*3/uL (ref 150–400)
RBC: 3.5 MIL/uL — ABNORMAL LOW (ref 4.22–5.81)
RDW: 12.4 % (ref 11.5–15.5)
WBC: 6.4 10*3/uL (ref 4.0–10.5)

## 2013-02-26 LAB — POCT ACTIVATED CLOTTING TIME
Activated Clotting Time: 232 seconds
Activated Clotting Time: 238 seconds

## 2013-02-26 LAB — GLUCOSE, CAPILLARY
GLUCOSE-CAPILLARY: 337 mg/dL — AB (ref 70–99)
Glucose-Capillary: 176 mg/dL — ABNORMAL HIGH (ref 70–99)
Glucose-Capillary: 175 mg/dL — ABNORMAL HIGH (ref 70–99)
Glucose-Capillary: 147 mg/dL — ABNORMAL HIGH (ref 70–99)

## 2013-02-26 MED ORDER — CLOPIDOGREL BISULFATE 75 MG PO TABS: 75.0000 mg | ORAL_TABLET | Freq: Every day | ORAL | Status: DC

## 2013-02-26 MED ORDER — METFORMIN HCL 1000 MG PO TABS: 1000.0000 mg | ORAL_TABLET | Freq: Two times a day (BID) | ORAL | Status: DC

## 2013-02-26 MED ORDER — INSULIN ASPART 100 UNIT/ML ~~LOC~~ SOLN: 0.0000 [IU] | Freq: Every day | SUBCUTANEOUS | Status: DC

## 2013-02-26 MED ORDER — INSULIN ASPART 100 UNIT/ML ~~LOC~~ SOLN: 0.0000 [IU] | Freq: Three times a day (TID) | SUBCUTANEOUS | Status: DC

## 2013-02-26 MED ORDER — ASPIRIN 325 MG PO TBEC: 325.0000 mg | DELAYED_RELEASE_TABLET | Freq: Every day | ORAL | Status: AC

## 2013-02-26 NOTE — Progress Notes (Signed)
Subjective: No complaints  Objective: Vital signs in last 24 hours: Temp:  [97.4 F (36.3 C)-98.3 F (36.8 C)] 97.8 F (36.6 C) (01/13 0527) Pulse Rate:  [55-102] 86 (01/13 0527) Resp:  [17-20] 20 (01/13 0527) BP: (118-190)/(46-105) 148/66 mmHg (01/13 0527) SpO2:  [96 %-100 %] 98 % (01/13 0527) Weight:  [160 lb 4.4 oz (72.7 kg)-165 lb (74.844 kg)] 160 lb 4.4 oz (72.7 kg) (01/13 0059) Last BM Date: 02/25/13  Intake/Output from previous day: 01/12 0701 - 01/13 0700 In: 120 [P.O.:120] Out: 2500 [Urine:2500] Intake/Output this shift: Total I/O In: -  Out: 350 [Urine:350]  Medications Current Facility-Administered Medications  Medication Dose Route Frequency Provider Last Rate Last Dose  . acetaminophen (TYLENOL) tablet 650 mg  650 mg Oral Q4H PRN Runell GessJonathan J Berry, MD      . ALPRAZolam Prudy Feeler(XANAX) tablet 0.5 mg  0.5 mg Oral TID PRN Joline Salthonda G Barrett, PA-C   0.5 mg at 02/26/13 0146  . amLODipine (NORVASC) tablet 10 mg  10 mg Oral Daily Runell GessJonathan J Berry, MD   10 mg at 02/25/13 1434  . aspirin EC tablet 325 mg  325 mg Oral Daily Runell GessJonathan J Berry, MD   325 mg at 02/25/13 1433  . benzonatate (TESSALON) capsule 100 mg  100 mg Oral TID PRN Runell GessJonathan J Berry, MD      . clopidogrel (PLAVIX) tablet 75 mg  75 mg Oral Q breakfast Runell GessJonathan J Berry, MD      . fluticasone [facility](FLONASE) 50 MCG/ACT nasal spray 1 spray  1 spray Each Nare Daily Runell GessJonathan J Berry, MD      . gabapentin (NEURONTIN) capsule 600 mg  600 mg Oral BID Runell GessJonathan J Berry, MD   600 mg at 02/25/13 2133  . hydrALAZINE (APRESOLINE) injection 10 mg  10 mg Intravenous Q4H PRN Rhonda G Barrett, PA-C      . HYDROcodone-acetaminophen (NORCO) 7.5-325 MG per tablet 1 tablet  1 tablet Oral Q4H PRN Runell GessJonathan J Berry, MD   1 tablet at 02/25/13 2131  . insulin aspart (novoLOG) injection 0-5 Units  0-5 Units Subcutaneous QHS Mian A Yousuf, MD      . insulin aspart (novoLOG) injection 0-9 Units  0-9 Units Subcutaneous TID WC Mian A Yousuf, MD      .  insulin aspart protamine- aspart (NOVOLOG MIX 70/30) injection 10-20 Units  10-20 Units Subcutaneous Q supper Runell GessJonathan J Berry, MD   20 Units at 02/25/13 2232  . insulin aspart protamine- aspart (NOVOLOG MIX 70/30) injection 35 Units  35 Units Subcutaneous QAC breakfast Runell GessJonathan J Berry, MD      . linagliptin (TRADJENTA) tablet 5 mg  5 mg Oral Daily Runell GessJonathan J Berry, MD   5 mg at 02/25/13 1434  . losartan (COZAAR) tablet 100 mg  100 mg Oral Daily Runell GessJonathan J Berry, MD   100 mg at 02/25/13 1437  . mometasone-formoterol (DULERA) 100-5 MCG/ACT inhaler 2 puff  2 puff Inhalation BID Runell GessJonathan J Berry, MD   2 puff at 02/25/13 1456  . morphine 2 MG/ML injection 1 mg  1 mg Intravenous Q1H PRN Runell GessJonathan J Berry, MD      . nicotine (NICODERM CQ - dosed in mg/24 hours) patch 14 mg  14 mg Transdermal Daily Rhonda G Barrett, PA-C   14 mg at 02/25/13 1734  . ondansetron (ZOFRAN) injection 4 mg  4 mg Intravenous Q6H PRN Runell GessJonathan J Berry, MD      . pantoprazole (PROTONIX) EC tablet 40 mg  40 mg Oral Daily Runell Gess, MD   40 mg at 02/25/13 1433  . polyethylene glycol (MIRALAX / GLYCOLAX) packet 17 g  17 g Oral Daily Runell Gess, MD   17 g at 02/25/13 2133  . spironolactone (ALDACTONE) tablet 50 mg  50 mg Oral BID Runell Gess, MD   50 mg at 02/25/13 2132  . tamsulosin (FLOMAX) capsule 0.4 mg  0.4 mg Oral Daily Runell Gess, MD   0.4 mg at 02/25/13 1434  . tiotropium (SPIRIVA) inhalation capsule 18 mcg  18 mcg Inhalation Daily PRN Runell Gess, MD        PE: General appearance: alert, cooperative and no distress Lungs: clear to auscultation bilaterally Heart: regular rate and rhythm, S1, S2 normal, no murmur, click, rub or gallop Extremities: No LEE Pulses: Radials 2+, 2+ left DP.  1+ right DP and PT Skin: left groin:  no hematoma, ecchymosis or tenderness at cath site. Neurologic: Grossly normal  Lab Results:   Recent Labs  02/26/13 0500  WBC 6.4  HGB 10.5*  HCT 40.5  PLT 202    BMET  Recent Labs  02/25/13 0908 02/25/13 2220  NA 127*  --   K 5.3  --   CL 91*  --   CO2 24  --   GLUCOSE 176* 525*  BUN 28*  --   CREATININE 1.15  --   CALCIUM 9.0  --     Assessment/Plan   Active Problems:   Claudication   Mobitz type I  Plan:  SP PV angiogram and successful diamondback orbital rotational atherectomy, PTA using drug-eluting balloon of high-grade calcified mid right SFA stenosis for lifestyle limiting claudication.  The patient was having some Mobitz 1 early yesterday. Clonidine was DCd. Sinus tachy last night.  No other rate control meds.   DC today.  Follow up LEA dopplers will be ordered.    LOS: 1 day    Donald Everett, Donald Everett 02/26/2013 6:28 AM  Attending Note:   The patient was seen and examined.  Agree with assessment and plan as noted above.  Changes made to the above note as needed.  Pt is doing well s/p PTA of mid right SFA.  Good pulses . Will ambulate this am and then DC to home Agree with holding the clonidine for now ( was only taking 0.1 mg Q HS).    Ready to go home    Vesta Mixer, Montez Hageman., MD, [facility] 02/26/2013, 8:24 AM

## 2013-02-26 NOTE — Discharge Summary (Signed)
Physician Discharge Summary  Patient ID: Paralee CancelWilliam L Stipp MRN: [id number] DOB/AGE: 73/06/1940 73 y.o.  Admit date: 02/25/2013 Discharge date: 02/26/2013  Cardiologist:  Allyson SabalBerry PCP: Syliva OvermanMargaret Simpson, MD   Admission Diagnoses: Claudication  Discharge Diagnoses:  Principal Problem:   Claudication Active Problems:   PAD (peripheral artery disease)   HYPERLIPIDEMIA   Mobitz type 1 second degree atrioventricular block  Discharged Condition: stable  Hospital Course:   Mr. Dewaine CongerBarker is a 73 year old man with known bilateral renal artery stenosis that has responded well to treatment with spironolactone. Blood pressure control is good without worsening of renal function. A recent repeat duplex ultrasound shows worsening of the left renal artery stenosis, with systolic velocities now well in excess of 400 cm/s. Blood pressure control remains good.   He complains about bilateral leg pain, although it is hard to say whether he is truly describing intermittent claudication, since the symptoms often persist for a long time at rest. Some of the features of this pain suggests neuropathy. Previous ultrasound studies have shown a 70% distal right superficial femoral artery stenosis. Both legs hurt him now.   He has treated diabetes mellitus, but his hemoglobin A1c has recently deteriorated. He is less compliant with dietary recommendations. He takes a statin for dyslipidemia, with excellent LDL cholesterol levels but with very low HDL. Unfortunately he continues to smoke although he thinks he has made a lot of progress with "cutting back". Nuclear myocardial perfusion imaging was normal in 2012.  The patient was scheduled for outpatient PV angiogram which revealed high-grade calcified mid right SFA stenosis.he also may have a distal left common femoral artery stenosis however this was not well visualized.  He underwent successful diamondback orbital rotational atherectomy, PTA using drug-eluting balloon of  high-grade calcified mid right SFA stenosis.   He was started on ASA and plavix.  Cath site was without ecchymosis, hematoma or tenderness.  The afternoon after the procedure he was noted to be in Mobitz type 1 heart block.  Clonidine was discontinued.  He is not on any other rate lowering meds.  The patient was seen by Dr. Elease HashimotoNahser who felt he was stable for DC home.  OP LEA dopplers were ordered.  He will follow up with Dr. Allyson SabalBerry after the dopplers.    Consults: None  Significant Diagnostic Studies:  PROCEDURE DESCRIPTION:  The patient was brought to the second floor [facility] lab in the postabsorptive state. He was premedicated with Valium 5 mg by mouth, IV Versed and fentanyl. His left groin  was prepped and shaved in usual sterile fashion. Xylocaine 1% was used for local anesthesia. A 5 French sheath was inserted into the left common femoral  artery using standard Seldinger technique.a 5 French pigtail catheter was placed at the level of the renal arteries an abdominal aortogram and bilateral iliac angiogram and bifemoral runoff was performed using digital subtraction bolus chase settable technique. Selective right and left renal interventions were also performed. Visipaque dye was used for the entirety of the case. Retrograde aortic pressure was monitored during the case.  HEMODYNAMICS:  AO SYSTOLIC/AO DIASTOLIC: 145/49  Angiographic Data:  1: Abdominal aortogram-70% bilateral renal artery stenosis. The infrarenal abdominal aorta was free of significant atherosclerotic changes  2: Left lower extremity-there may have been a distal common femoral lesion he'll on the left however the profunda femoris crossover and obscured visualization. There was 40% focal mid stenosis with three-vessel runoff. There was 90% segmental proximal inserted and posterior tibial  3: Right  lower extremity-50% followed by 60 per percent focal proximal right SFA stenosis. There was a 99% calcified mid right SFA  stenosis followed by tandem 60% stenoses in the distal SFA and above the knee popliteal. There was one vessel runoff via the posterior tibial with a 90% tibioperoneal trunk.  IMPRESSION:Mr. Huberty has high-grade calcified mid right SFA stenosis.he also may have a distal left common femoral artery stenosis however this was not well visualized. We'll proceed with diamondback orbital rotational atherectomy and drug-eluting balloon angioplasty.  Procedure Description:contralateral access was obtained with a crossover catheter and a 7 French 55 cm Ansel sheath. Total contrast used during the case was 217 cc. 7000 units of heparin was administered with an ACT of 238. The lesion was crossed with an 014/300 cm long Rigali a wire within a Quickcross endhole catheter. This was then switched out for a no 14 viper wire. A 6 mm NAV 6 distal protection device was then deployed over the viper wire in the above-the-knee popliteal artery. Orbital atherectomy was performed with a 2 mm solid Crown at up to 120,000 rpm's. Following this a Lutonix 5 mm x 4 cm long drug eluding the balloon was then used to perform angioplasty of the atherectomized site. The final angiographic result reduction of a 99% calcified mid right SFA stenosis to less than 10% residual without dissection. Appreciate the opportunity performed. The distal protection device was retrieved. The sheath was then withdrawn across the bifurcation and exchanged over an 035 wire for a short 7 [place] sheath. The patient was given 300 milligrams of by mouth Plavix as well as Pepcid.  Final Impression: successful diamondback orbital rotational atherectomy, PTA using drug-eluting balloon of high-grade calcified mid right SFA stenosis for lifestyle limiting claudication. The patient will be hydrated overnight. The sheath will be removed once the ACT falls below 170. He went to a full antibiotic therapy and discharged home in the morning. We'll get followup Dopplers and I will  see him back in the office. We may stage left common femoral intervention.  Runell Gess MD, [facility]  02/25/2013   Treatments: See above  Discharge Exam: Blood pressure 153/59, pulse 71, temperature 97.4 F (36.3 C), temperature source Oral, resp. rate 18, height 5\' 9"  (1.753 m), weight 160 lb 4.4 oz (72.7 kg), SpO2 98.00%.   Disposition: 01-Home or Self Care       Future Appointments Provider Department Dept Phone   03/18/2013 1:15 PM Kerri Perches, MD [facility] [phone]   04/02/2013 10:30 AM Runell Gess, MD [facility] [phone]       Medication List    ASK your doctor about these medications       ALPRAZolam 0.5 MG tablet  Commonly known as:  XANAX  Take 0.5 mg by mouth at bedtime.     amLODipine 10 MG tablet  Commonly known as:  NORVASC  Take 10 mg by mouth daily.     aspirin 81 MG tablet  Take 81 mg by mouth daily.     B-complex with vitamin C tablet  Take 1 tablet by mouth daily.     benzonatate 100 MG capsule  Commonly known as:  TESSALON  Take 100 mg by mouth 3 (three) times daily as needed for cough.     cloNIDine 0.1 MG tablet  Commonly known as:  CATAPRES  Take 0.1 mg by mouth at bedtime.     FLONASE 50 MCG/ACT nasal spray  Generic drug:  fluticasone  Place 1  spray into both nostrils daily.     Fluticasone-Salmeterol 100-50 MCG/DOSE Aepb  Commonly known as:  ADVAIR DISKUS  Inhale 1 puff into the lungs 2 (two) times daily.     gabapentin 300 MG capsule  Commonly known as:  NEURONTIN  Take 600 mg by mouth 2 (two) times daily.     HYDROcodone-acetaminophen 7.5-325 MG per tablet  Commonly known as:  NORCO  Take 1 tablet by mouth 2 (two) times daily as needed for pain.     insulin aspart protamine- aspart (70-30) 100 UNIT/ML injection  Commonly known as:  NOVOLOG MIX 70/30  Inject 10-35 Units into the skin 2 (two) times daily with a meal. 35 units in the morning and 10-20 units in the evening. If CBG  <175 in the evening;pm dose will be 10 units rather than 20 units.     losartan 100 MG tablet  Commonly known as:  COZAAR  Take 1 tablet (100 mg total) by mouth daily.     metFORMIN 1000 MG tablet  Commonly known as:  GLUCOPHAGE  Take 1,000 mg by mouth 2 (two) times daily with a meal.     omeprazole 40 MG capsule  Commonly known as:  PRILOSEC  Take 40 mg by mouth daily as needed (reflux).     polyethylene glycol packet  Commonly known as:  MIRALAX / GLYCOLAX  Take 17 g by mouth daily. Mix with 8 oz of water.     pravastatin 20 MG tablet  Commonly known as:  PRAVACHOL  Take 1 tablet (20 mg total) by mouth every evening.     sitaGLIPtin 100 MG tablet  Commonly known as:  JANUVIA  Take 100 mg by mouth daily.     spironolactone 50 MG tablet  Commonly known as:  ALDACTONE  Take 50 mg by mouth 2 (two) times daily.     tamsulosin 0.4 MG Caps capsule  Commonly known as:  FLOMAX  Take 1 capsule (0.4 mg total) by mouth daily.     tiotropium 18 MCG inhalation capsule  Commonly known as:  SPIRIVA  Place 18 mcg into inhaler and inhale daily as needed (if not breathing well.).       Follow-up Information   Follow up with Runell Gess, MD On 04/02/2013. (10:30Am)    Specialty:  Cardiology   Contact information:   [address] [place] [address] [phone]       Signed: Wilburt Finlay 02/26/2013, 8:52 AM  Attending Note:   The patient was seen and examined.  Agree with assessment and plan as noted above.  Changes made to the above note as needed.  See my note from earlier today   Alvia Grove., MD, [facility] 02/26/2013, 6:14 PM

## 2013-02-27 ENCOUNTER — Encounter (HOSPITAL_COMMUNITY): Payer: Self-pay | Admitting: *Deleted

## 2013-03-12 ENCOUNTER — Ambulatory Visit (HOSPITAL_COMMUNITY)
Admission: RE | Admit: 2013-03-12 | Discharge: 2013-03-12 | Disposition: A | Discharge status: Home / Self Care | Payer: Medicare Other | Source: Ambulatory Visit | Attending: Cardiology | Admitting: Cardiology

## 2013-03-12 DIAGNOSIS — I70209 Unspecified atherosclerosis of native arteries of extremities, unspecified extremity: Secondary | ICD-10-CM | POA: Insufficient documentation

## 2013-03-12 DIAGNOSIS — I70219 Atherosclerosis of native arteries of extremities with intermittent claudication, unspecified extremity: Secondary | ICD-10-CM

## 2013-03-12 DIAGNOSIS — I739 Peripheral vascular disease, unspecified: Secondary | ICD-10-CM | POA: Insufficient documentation

## 2013-03-12 NOTE — Progress Notes (Signed)
Right Lower Ext. Arterial Duplex Completed. Iverna Hammac, BS, RDMS, RVT  

## 2013-03-16 LAB — COMPLETE METABOLIC PANEL WITH GFR
ALT: 13 U/L (ref 0–53)
AST: 20 U/L (ref 0–37)
Albumin: 3.8 g/dL (ref 3.5–5.2)
Alkaline Phosphatase: 54 U/L (ref 39–117)
BUN: 19 mg/dL (ref 6–23)
CO2: 27 mEq/L (ref 19–32)
Calcium: 9.3 mg/dL (ref 8.4–10.5)
Chloride: 99 mEq/L (ref 96–112)
Creat: 1.16 mg/dL (ref 0.50–1.35)
GFR, Est African American: 72 mL/min
GFR, Est Non African American: 63 mL/min
Glucose, Bld: 131 mg/dL — ABNORMAL HIGH (ref 70–99)
Potassium: 5.5 mEq/L — ABNORMAL HIGH (ref 3.5–5.3)
Sodium: 133 mEq/L — ABNORMAL LOW (ref 135–145)
Total Bilirubin: 0.3 mg/dL (ref 0.2–1.2)
Total Protein: 6.7 g/dL (ref 6.0–8.3)

## 2013-03-16 LAB — HEMOGLOBIN A1C
Hgb A1c MFr Bld: 8.2 % — ABNORMAL HIGH (ref <5.7)
Mean Plasma Glucose: 189 mg/dL — ABNORMAL HIGH (ref <117)

## 2013-03-18 ENCOUNTER — Encounter: Payer: Self-pay | Admitting: Family Medicine

## 2013-03-18 ENCOUNTER — Ambulatory Visit (INDEPENDENT_AMBULATORY_CARE_PROVIDER_SITE_OTHER): Payer: Medicare Other | Admitting: Family Medicine

## 2013-03-18 VITALS — BP 144/70 | HR 82 | Resp 18 | Ht 70.0 in | Wt 165.1 lb

## 2013-03-18 DIAGNOSIS — Z91199 Patient's noncompliance with other medical treatment and regimen due to unspecified reason: Secondary | ICD-10-CM

## 2013-03-18 DIAGNOSIS — F411 Generalized anxiety disorder: Secondary | ICD-10-CM

## 2013-03-18 DIAGNOSIS — Z5329 Procedure and treatment not carried out because of patient's decision for other reasons: Secondary | ICD-10-CM

## 2013-03-18 DIAGNOSIS — M545 Low back pain, unspecified: Secondary | ICD-10-CM

## 2013-03-18 DIAGNOSIS — E109 Type 1 diabetes mellitus without complications: Secondary | ICD-10-CM

## 2013-03-18 DIAGNOSIS — I798 Other disorders of arteries, arterioles and capillaries in diseases classified elsewhere: Secondary | ICD-10-CM

## 2013-03-18 DIAGNOSIS — I150 Renovascular hypertension: Secondary | ICD-10-CM

## 2013-03-18 DIAGNOSIS — F419 Anxiety disorder, unspecified: Secondary | ICD-10-CM

## 2013-03-18 DIAGNOSIS — E785 Hyperlipidemia, unspecified: Secondary | ICD-10-CM

## 2013-03-18 DIAGNOSIS — F17200 Nicotine dependence, unspecified, uncomplicated: Secondary | ICD-10-CM

## 2013-03-18 DIAGNOSIS — E1159 Type 2 diabetes mellitus with other circulatory complications: Secondary | ICD-10-CM

## 2013-03-18 DIAGNOSIS — J449 Chronic obstructive pulmonary disease, unspecified: Secondary | ICD-10-CM

## 2013-03-18 DIAGNOSIS — IMO0002 Reserved for concepts with insufficient information to code with codable children: Secondary | ICD-10-CM

## 2013-03-18 DIAGNOSIS — N521 Erectile dysfunction due to diseases classified elsewhere: Principal | ICD-10-CM

## 2013-03-18 DIAGNOSIS — D519 Vitamin B12 deficiency anemia, unspecified: Secondary | ICD-10-CM

## 2013-03-18 DIAGNOSIS — D518 Other vitamin B12 deficiency anemias: Secondary | ICD-10-CM

## 2013-03-18 DIAGNOSIS — K219 Gastro-esophageal reflux disease without esophagitis: Secondary | ICD-10-CM

## 2013-03-18 DIAGNOSIS — F5105 Insomnia due to other mental disorder: Secondary | ICD-10-CM

## 2013-03-18 DIAGNOSIS — E1165 Type 2 diabetes mellitus with hyperglycemia: Principal | ICD-10-CM

## 2013-03-18 DIAGNOSIS — Z5320 Procedure and treatment not carried out because of patient's decision for unspecified reasons: Secondary | ICD-10-CM

## 2013-03-18 DIAGNOSIS — N529 Male erectile dysfunction, unspecified: Secondary | ICD-10-CM

## 2013-03-18 DIAGNOSIS — F489 Nonpsychotic mental disorder, unspecified: Secondary | ICD-10-CM

## 2013-03-18 MED ORDER — TEMAZEPAM 15 MG PO CAPS: 15.0000 mg | ORAL_CAPSULE | Freq: Every evening | ORAL | Status: DC | PRN

## 2013-03-18 MED ORDER — INSULIN LISPRO PROT & LISPRO (50-50 MIX) 100 UNIT/ML ~~LOC~~ SUSP: SUBCUTANEOUS | Status: DC

## 2013-03-18 MED ORDER — HYDROCODONE-ACETAMINOPHEN 7.5-325 MG PO TABS: 1.0000 | ORAL_TABLET | Freq: Two times a day (BID) | ORAL | Status: DC | PRN

## 2013-03-18 MED ORDER — CLOPIDOGREL BISULFATE 75 MG PO TABS: 75.0000 mg | ORAL_TABLET | Freq: Every day | ORAL | Status: DC

## 2013-03-18 MED ORDER — CYANOCOBALAMIN 1000 MCG/ML IJ SOLN
1000.0000 ug | Freq: Once | INTRAMUSCULAR | Status: AC
  Administered 2013-03-18: 1000 ug via INTRAMUSCULAR

## 2013-03-18 MED ORDER — ALPRAZOLAM 0.5 MG PO TABS: 0.5000 mg | ORAL_TABLET | Freq: Every day | ORAL | Status: DC

## 2013-03-18 MED ORDER — OSELTAMIVIR PHOSPHATE 75 MG PO CAPS: 75.0000 mg | ORAL_CAPSULE | Freq: Two times a day (BID) | ORAL | Status: DC

## 2013-03-18 MED ORDER — PANTOPRAZOLE SODIUM 40 MG PO TBEC: 40.0000 mg | DELAYED_RELEASE_TABLET | Freq: Every day | ORAL | Status: DC

## 2013-03-18 NOTE — Patient Instructions (Addendum)
F/u in 3 month, call if you need me before  Blood sugar has improved, congrats.  Continue current medication dose, call with concerns (yiou are currently taking the insulin once daily 35 units per your report most of the time)  !0 ciggs per day  For  February please  New for sleep is restoril , fill at same time as xanax, until dose is clarified , then will try for 90 day supply  Insulin is changed to humalog due to cost, per your request, also omeprazole changed to pantoprazole per your request.  I will send in plavix per your request, I do see that it was ordered i January for 1 year  Fasting lipid, cmp and EGFR and hBA1C in 3 months, before next visit  You sound as if you may be getting influenza. Take tylenol for pain and fever, and I will give you a script of tamiflu  To fill only if your symptoms worsen  Influenza A (H1N1) H1N1 formerly called "swine flu" is a new influenza virus causing sickness in people. The H1N1 virus is different from seasonal influenza viruses. However, the H1N1 symptoms are similar to seasonal influenza and it is spread from person to person. You may be at higher risk for serious problems if you have underlying serious medical conditions. The CDC and the [facility] are following reported cases around the world. CAUSES   The flu is thought to spread mainly person-to-person through coughing or sneezing of infected people.  A person may become infected by touching something with the virus on it and then touching their mouth or nose. SYMPTOMS   Fever.  Headache.  Tiredness.  Cough.  Sore throat.  Runny or stuffy nose.  Body aches.  Diarrhea and vomiting These symptoms are referred to as "flu-like symptoms." A lot of different illnesses, including the common cold, may have similar symptoms. DIAGNOSIS   There are tests that can tell if you have the H1N1 virus.  Confirmed cases of H1N1 will be reported to the state or local health  department.  A doctor's exam may be needed to tell whether you have an infection that is a complication of the flu. HOME CARE INSTRUCTIONS   Stay informed. Visit the [facility] website for current recommendations. Visit [employer]. You may also call 1-800-CDC-INFO 607-775-3432).  Get help early if you develop any of the above symptoms.  If you are at high risk from complications of the flu, talk to your caregiver as soon as you develop flu-like symptoms. Those at higher risk for complications include:  People 65 years or older.  People with chronic medical conditions.  Pregnant women.  Young children.  Your caregiver may recommend antiviral medicine to help treat the flu.  If you get the flu, get plenty of rest, drink enough water and fluids to keep your urine clear or pale yellow, and avoid using alcohol or tobacco.  You may take over-the-counter medicine to relieve the symptoms of the flu if your caregiver approves. (Never give aspirin to children or teenagers who have flu-like symptoms, particularly fever). TREATMENT  If you do get sick, antiviral drugs are available. These drugs can make your illness milder and make you feel better faster. Treatment should start soon after illness starts. It is only effective if taken within the first day of becoming ill. Only your caregiver can prescribe antiviral medication.  PREVENTION   Cover your nose and mouth with a tissue or your arm when you cough or sneeze. Throw  the tissue away.  Wash your hands often with soap and warm water, especially after you cough or sneeze. Alcohol-based cleaners are also effective against germs.  Avoid touching your eyes, nose or mouth. This is one way germs spread.  Try to avoid contact with sick people. Follow public health advice regarding school closures. Avoid crowds.  Stay home if you get sick. Limit contact with others to keep from infecting them. People infected with the H1N1 virus may be able  to infect others anywhere from 1 day before feeling sick to 5-7 days after getting flu symptoms.  An H1N1 vaccine is available to help protect against the virus. In addition to the H1N1 vaccine, you will need to be vaccinated for seasonal influenza. The H1N1 and seasonal vaccines may be given on the same day. The CDC especially recommends the H1N1 vaccine for:  Pregnant women.  People who live with or care for children younger than 28 months of age.  Health care and emergency services personnel.  Persons between the ages of 58 months through 51 years of age.  People from ages 80 through 44 years who are at higher risk for H1N1 because of chronic health disorders or immune system problems. FACEMASKS In community and home settings, the use of facemasks and N95 respirators are not normally recommended. In certain circumstances, a facemask or N95 respirator may be used for persons at increased risk of severe illness from influenza. Your caregiver can give additional recommendations for facemask use. IN CHILDREN, EMERGENCY WARNING SIGNS THAT NEED URGENT MEDICAL CARE:  Fast breathing or trouble breathing.  Bluish skin color.  Not drinking enough fluids.  Not waking up or not interacting normally.  Being so fussy that the child does not want to be held.  Your child has an oral temperature above 102 F (38.9 C), not controlled by medicine.  Your baby is older than 3 months with a rectal temperature of 102 F (38.9 C) or higher.  Your baby is 68 months old or younger with a rectal temperature of 100.4 F (38 C) or higher.  Flu-like symptoms improve but then return with fever and worse cough. IN ADULTS, EMERGENCY WARNING SIGNS THAT NEED URGENT MEDICAL CARE:  Difficulty breathing or shortness of breath.  Pain or pressure in the chest or abdomen.  Sudden dizziness.  Confusion.  Severe or persistent vomiting.  Bluish color.  You have a oral temperature above 102 F (38.9 C), not  controlled by medicine.  Flu-like symptoms improve but return with fever and worse cough. SEEK IMMEDIATE MEDICAL CARE IF:  You or someone you know is experiencing any of the above symptoms. When you arrive at the emergency center, report that you think you have the flu. You may be asked to wear a mask and/or sit in a secluded area to protect others from getting sick. MAKE SURE YOU:   Understand these instructions.  Will watch your condition.  Will get help right away if you are not doing well or get worse. Some of this information courtesy of the CDC.  Document Released: 07/20/2007 Document Revised: 04/25/2011 Document Reviewed: 07/20/2007 [facility] Patient Information 2014 [place], [place].

## 2013-03-19 ENCOUNTER — Telehealth: Payer: Self-pay | Admitting: *Deleted

## 2013-03-19 ENCOUNTER — Encounter: Payer: Self-pay | Admitting: *Deleted

## 2013-03-19 DIAGNOSIS — I739 Peripheral vascular disease, unspecified: Secondary | ICD-10-CM

## 2013-03-19 NOTE — Telephone Encounter (Signed)
Message copied by Marella BileVOGEL, Elih Mooney W. on Tue Mar 19, 2013 10:13 PM ------      Message from: Runell GessBERRY, JONATHAN J      Created: Mon Mar 18, 2013  8:26 PM       Improved RABI post intervention. Repeat 6 months ------

## 2013-03-19 NOTE — Telephone Encounter (Signed)
Order placed for repeat lower ext dopplers in 6  months 

## 2013-04-02 ENCOUNTER — Ambulatory Visit: Payer: Medicare Other | Admitting: Cardiovascular Disease

## 2013-04-08 ENCOUNTER — Other Ambulatory Visit: Payer: Self-pay

## 2013-04-08 MED ORDER — TAMSULOSIN HCL 0.4 MG PO CAPS: 0.4000 mg | ORAL_CAPSULE | Freq: Every day | ORAL | Status: DC

## 2013-04-08 MED ORDER — LOSARTAN POTASSIUM 100 MG PO TABS: 100.0000 mg | ORAL_TABLET | Freq: Every day | ORAL | Status: DC

## 2013-04-09 ENCOUNTER — Other Ambulatory Visit: Payer: Self-pay

## 2013-04-09 DIAGNOSIS — F419 Anxiety disorder, unspecified: Secondary | ICD-10-CM

## 2013-04-09 DIAGNOSIS — F5105 Insomnia due to other mental disorder: Secondary | ICD-10-CM

## 2013-04-09 MED ORDER — TEMAZEPAM 15 MG PO CAPS: 15.0000 mg | ORAL_CAPSULE | Freq: Every evening | ORAL | Status: DC | PRN

## 2013-04-10 ENCOUNTER — Other Ambulatory Visit: Payer: Self-pay | Admitting: Family Medicine

## 2013-04-10 MED ORDER — ALPRAZOLAM 0.5 MG PO TABS: 0.5000 mg | ORAL_TABLET | Freq: Every day | ORAL | Status: DC

## 2013-04-12 ENCOUNTER — Other Ambulatory Visit: Payer: Self-pay

## 2013-04-12 MED ORDER — HYDROCODONE-ACETAMINOPHEN 7.5-325 MG PO TABS: 1.0000 | ORAL_TABLET | Freq: Two times a day (BID) | ORAL | Status: DC | PRN

## 2013-04-14 DIAGNOSIS — F17200 Nicotine dependence, unspecified, uncomplicated: Secondary | ICD-10-CM | POA: Insufficient documentation

## 2013-04-14 NOTE — Assessment & Plan Note (Signed)
Sleep hygiene reviewed and written information offered also. Prescription sent for  medication needed.  

## 2013-04-14 NOTE — Assessment & Plan Note (Signed)
Adequate control, no change in meds, recently had procedure on renal arteries

## 2013-04-14 NOTE — Assessment & Plan Note (Signed)
Controlled, no change in medication  

## 2013-04-14 NOTE — Assessment & Plan Note (Signed)
Cutting back , but still unable to set a quit date Patient counseled for approximately 5 minutes regarding the health risks of ongoing nicotine use, specifically all types of cancer, heart disease, stroke and respiratory failure. The options available for help with cessation ,the behavioral changes to assist the process, and the option to either gradully reduce usage  Or abruptly stop.is also discussed. Pt is also encouraged to set specific goals in number of cigarettes used daily, as well as to set a quit date.  

## 2013-04-14 NOTE — Assessment & Plan Note (Signed)
Unchanged, chronic meds as before 

## 2013-04-14 NOTE — Assessment & Plan Note (Signed)
Improved, no med change Patient advised to reduce carb and sweets, commit to regular physical activity, take meds as prescribed, test blood as directed, and attempt to lose weight, to improve blood sugar control.  

## 2013-04-14 NOTE — Assessment & Plan Note (Signed)
Controlled, low HDL increased physical activity encouraged, which will happen when the weather improves Hyperlipidemia:Low fat diet discussed and encouraged.

## 2013-04-14 NOTE — Assessment & Plan Note (Signed)
Still refusing colonoscopy and here is a positive f/h of colon ca

## 2013-04-14 NOTE — Assessment & Plan Note (Signed)
Worsening due to ongoing nicotine, though he is cutting back but not yet willing to set a quit date

## 2013-04-14 NOTE — Progress Notes (Signed)
Subjective:    Patient ID: Donald Everett, male    DOB: 10-16-1940, 73 y.o.   MRN: [id number]  HPI The PT is here for follow up and re-evaluation of chronic medical conditions, medication management and review of any available recent lab and radiology data.  Preventive health is updated, specifically  Cancer screening and Immunization.   Recently had left and right renal artery interventions successfully states he has less calf pain. Still cutting back on cigarettes and not yet able to set a quit date The PT denies any adverse reactions to current medications since the last visit.  States blood sugars are improved, denies hypoglycemic episodes  Ongoing problem with insomnia and anxiety needs additional medication, despite good sleep hygiene he continues to have difficulty      Review of Systems See HPI Denies recent fever or chills. Denies sinus pressure, nasal congestion, ear pain or sore throat. Denies chest congestion, productive cough or wheezing. Denies chest pains, palpitations and leg swelling Denies abdominal pain, nausea, vomiting,diarrhea or constipation.   Denies dysuria, frequency, hesitancy or incontinence. Chronic back pain unchanged Denies headaches, seizures, numbness, or tingling. Denies depression, doe shave  anxiety and  insomnia. Denies skin break down or rash.        Objective:   Physical Exam BP 144/70  Pulse 82  Resp 18  Ht 5\' 10"  (1.778 m)  Wt 165 lb 1.9 oz (74.898 kg)  BMI 23.69 kg/m2  SpO2 97% Patient alert and oriented and in no cardiopulmonary distress.  HEENT: No facial asymmetry, EOMI, no sinus tenderness,  oropharynx pink and moist.  Neck decreased though adequate ROM, no jVd, no adenopathy.  Chest: Clear to auscultation bilaterally.Decreased air entry   CVS: S1, S2 no murmurs, no S3.  ABD: Soft non tender. Bowel sounds normal.  Ext: No edema  MS: Adequate though reduced  ROM spine,  Adequate in shoulders, hips and  knees.  Skin: Intact, no ulcerations or rash noted.  Psych: Good eye contact, normal affect. Memory intact not anxious or depressed appearing.  CNS: CN 2-12 intact, marked hearing loss, power and tone normal in all 4 extremities        Assessment & Plan:  Renovascular hypertension Adequate control, no change in meds, recently had procedure on renal arteries  COPD (chronic obstructive pulmonary disease) Worsening due to ongoing nicotine, though he is cutting back but not yet willing to set a quit date  IDDM Improved, no med change Patient advised to reduce carb and sweets, commit to regular physical activity, take meds as prescribed, test blood as directed, and attempt to lose weight, to improve blood sugar control.   LOW BACK PAIN, CHRONIC Unchanged, chronic meds as before  HYPERLIPIDEMIA Controlled, low HDL increased physical activity encouraged, which will happen when the weather improves Hyperlipidemia:Low fat diet discussed and encouraged.    Insomnia secondary to anxiety Sleep hygiene reviewed and written information offered also. Prescription sent for  medication needed.   Nicotine dependence Cutting back, but still unable to set a quit date. Patient counseled for approximately 5 minutes regarding the health risks of ongoing nicotine use, specifically all types of cancer, heart disease, stroke and respiratory failure. The options available for help with cessation ,the behavioral changes to assist the process, and the option to either gradully reduce usage  Or abruptly stop.is also discussed. Pt is also encouraged to set specific goals in number of cigarettes used daily, as well as to set a quit date.   Patient  non-compliant, refused service Still refusing colonoscopy and here is a positive f/h of colon ca  GERD (gastroesophageal reflux disease) Controlled, no change in medication

## 2013-04-22 ENCOUNTER — Ambulatory Visit: Payer: Medicare Other

## 2013-04-23 ENCOUNTER — Ambulatory Visit (INDEPENDENT_AMBULATORY_CARE_PROVIDER_SITE_OTHER): Payer: Medicare Other

## 2013-04-23 ENCOUNTER — Encounter (INDEPENDENT_AMBULATORY_CARE_PROVIDER_SITE_OTHER): Payer: Self-pay

## 2013-04-23 VITALS — BP 130/80 | Wt 165.4 lb

## 2013-04-23 DIAGNOSIS — D518 Other vitamin B12 deficiency anemias: Secondary | ICD-10-CM

## 2013-04-23 DIAGNOSIS — D519 Vitamin B12 deficiency anemia, unspecified: Secondary | ICD-10-CM

## 2013-04-23 MED ORDER — CYANOCOBALAMIN 1000 MCG/ML IJ SOLN
1000.0000 ug | Freq: Once | INTRAMUSCULAR | Status: AC
  Administered 2013-04-23: 1000 ug via INTRAMUSCULAR

## 2013-04-23 NOTE — Progress Notes (Signed)
Patient in and received his B12 injection in left arm with no complications

## 2013-04-30 ENCOUNTER — Ambulatory Visit (INDEPENDENT_AMBULATORY_CARE_PROVIDER_SITE_OTHER): Payer: Medicare Other | Admitting: Cardiovascular Disease

## 2013-04-30 ENCOUNTER — Encounter: Payer: Self-pay | Admitting: Cardiovascular Disease

## 2013-04-30 VITALS — BP 146/70 | HR 69 | Ht 69.0 in | Wt 165.5 lb

## 2013-04-30 DIAGNOSIS — I701 Atherosclerosis of renal artery: Secondary | ICD-10-CM

## 2013-04-30 NOTE — Progress Notes (Signed)
04/30/2013 Donald Everett   [birth year]  [id number]  Primary Physician Syliva Overman, MD Primary Cardiologist: Runell Gess MD Roseanne Reno   HPI:  Donald Everett is a 73 year old man with known bilateral renal artery stenosis that has responded well to treatment with spinronolactone. Blood pressure control is good without worsening of renal function. A recent repeat duplex ultrasound shows worsening of the left renal artery stenosis, with systolic velocities now well in excess of 400 cm/s. Blood pressure control remains good.  He complains about bilateral leg pain, although it is hard to say whether he is truly describing intermittent claudication, since the symptoms often persist for a long time at rest. Some of the features of this pain suggests neuropathy. Previous ultrasound studies have shown a 70% distal right superficial femoral artery stenosis. Both legs hurt him now.  He has treated diabetes mellitus, but his hemoglobin A1c has recently deteriorated. He is less compliant with dietary recommendations. He takes a statin for dyslipidemia, with excellent LDL cholesterol levels but with very low HDL. Unfortunately he continues to smoke although he thinks he has made a lot of progress with "cutting back". Nuclear myocardial perfusion imaging was normal in 2012. I performed diamondback orbital rotation arthrectomy, PTA of his highly calcified mid right SFA for lifestyle limiting claudication with an excellent angiographic and clinical result. His claudication has resolved. He does have only one vessel runoff below the knee the posterior tibial with a high-grade tibioperoneal trunk stenosis. The followup arterial Doppler studies performed 03/12/13 revealed a right ABI of 0.93 with a widely patent right SFA.    Current Outpatient Prescriptions  Medication Sig Dispense Refill  . ALPRAZolam (XANAX) 0.5 MG tablet Take 1 tablet (0.5 mg total) by mouth at bedtime.  90 tablet  1  .  amLODipine (NORVASC) 10 MG tablet Take 10 mg by mouth daily.      Marland Kitchen aspirin EC 325 MG EC tablet Take 1 tablet (325 mg total) by mouth daily.  30 tablet  0  . B Complex-C (B-COMPLEX WITH VITAMIN C) tablet Take 1 tablet by mouth daily.      . cloNIDine (CATAPRES) 0.1 MG tablet       . clopidogrel (PLAVIX) 75 MG tablet Take 1 tablet (75 mg total) by mouth daily with breakfast.  10 tablet  0  . clopidogrel (PLAVIX) 75 MG tablet Take 1 tablet (75 mg total) by mouth daily.  90 tablet  3  . fluticasone (FLONASE) 50 MCG/ACT nasal spray Place 1 spray into both nostrils daily.   15 g  2  . Fluticasone-Salmeterol (ADVAIR DISKUS) 100-50 MCG/DOSE AEPB Inhale 1 puff into the lungs 2 (two) times daily.  180 each  3  . gabapentin (NEURONTIN) 300 MG capsule Take 600 mg by mouth 2 (two) times daily.      Marland Kitchen HYDROcodone-acetaminophen (NORCO) 7.5-325 MG per tablet Take 1 tablet by mouth 2 (two) times daily as needed.  60 tablet  0  . insulin aspart protamine- aspart (NOVOLOG MIX 70/30) (70-30) 100 UNIT/ML injection Inject 10-35 Units into the skin 2 (two) times daily with a meal. 35 units in the morning and 10-20 units in the evening. If CBG <175 in the evening;pm dose will be 10 units rather than 20 units.      . insulin lispro protamine-lispro (HUMALOG 50/50 MIX) (50-50) 100 UNIT/ML SUSP injection 10 to 35 units twice daily before meals  30 mL  11  . losartan (COZAAR) 100 MG tablet  Take 1 tablet (100 mg total) by mouth daily.  90 tablet  1  . metFORMIN (GLUCOPHAGE) 1000 MG tablet Take 1 tablet (1,000 mg total) by mouth 2 (two) times daily with a meal.      . oseltamivir (TAMIFLU) 75 MG capsule Take 1 capsule (75 mg total) by mouth 2 (two) times daily.  10 capsule  0  . pantoprazole (PROTONIX) 40 MG tablet Take 1 tablet (40 mg total) by mouth daily.  90 tablet  3  . polyethylene glycol (MIRALAX / GLYCOLAX) packet Take 17 g by mouth daily. Mix with 8 oz of water.      . pravastatin (PRAVACHOL) 20 MG tablet Take 1 tablet  (20 mg total) by mouth every evening.  90 tablet  3  . sitaGLIPtin (JANUVIA) 100 MG tablet Take 100 mg by mouth daily.      Marland Kitchen. spironolactone (ALDACTONE) 50 MG tablet Take 50 mg by mouth 2 (two) times daily.      . tamsulosin (FLOMAX) 0.4 MG CAPS capsule Take 1 capsule (0.4 mg total) by mouth daily.  90 capsule  1  . temazepam (RESTORIL) 15 MG capsule Take 1 capsule (15 mg total) by mouth at bedtime as needed for sleep.  90 capsule  1  . tiotropium (SPIRIVA) 18 MCG inhalation capsule Place 18 mcg into inhaler and inhale daily as needed (if not breathing well.).       No current facility-administered medications for this visit.    No Known Allergies  History   Social History  . Marital Status: Single    Spouse Name: N/A    Number of Children: N/A  . Years of Education: N/A   Occupational History  . Not on file.   Social History Main Topics  . Smoking status: Current Every Day Smoker -- 1.00 packs/day for 57 years    Types: Cigarettes  . Smokeless tobacco: Former User    Types: Chew     Comment: 02/25/2013 "chewed a little back in the 1960's"  . Alcohol Use: 8.4 oz/week    14 Cans of beer per week  . Drug Use: No  . Sexual Activity: No   Other Topics Concern  . Not on file   Social History Narrative  . No narrative on file     Review of Systems: General: negative for chills, fever, night sweats or weight changes.  Cardiovascular: negative for chest pain, dyspnea on exertion, edema, orthopnea, palpitations, paroxysmal nocturnal dyspnea or shortness of breath Dermatological: negative for rash Respiratory: negative for cough or wheezing Urologic: negative for hematuria Abdominal: negative for nausea, vomiting, diarrhea, bright red blood per rectum, melena, or hematemesis Neurologic: negative for visual changes, syncope, or dizziness All other systems reviewed and are otherwise negative except as noted above.    Blood pressure 146/70, pulse 69, height 5\' 9"  (1.753 m),  weight 165 lb 8 oz (75.07 kg).  General appearance: alert and no distress Neck: no adenopathy, no carotid bruit, no JVD, supple, symmetrical, trachea midline and thyroid not enlarged, symmetric, no tenderness/mass/nodules Lungs: clear to auscultation bilaterally Heart: regular rate and rhythm, S1, S2 normal, no murmur, click, rub or gallop Extremities: extremities normal, atraumatic, no cyanosis or edema  EKG not performed today  ASSESSMENT AND PLAN:   Renovascular hypertension Angiographic with documented 70% bilateral renal artery stenosis 02/25/13. We have been following with Dr. Aubery LappingUltrasound in the office. His blood pressures have remained stable. We'll continue to follow this. His renal aortic ratio on the left  was over 5.  PAD (peripheral artery disease) Status post high-speed rotational atherectomy of a highly calcified mid right SFA stenosis with an excellent angiographic and clinical result. The followup Dopplers performed 03/12/13 revealed a widely patent SFA with a right ABI of 0.93. His claudication has resolved. He does continue to smoke. We'll continue to follow this on a semiannual basis.      Runell Gess MD FACP,FACC,FAHA, [facility] 04/30/2013 10:47 AM

## 2013-04-30 NOTE — Assessment & Plan Note (Signed)
Angiographic with documented 70% bilateral renal artery stenosis 02/25/13. We have been following with Dr. Aubery LappingUltrasound in the office. His blood pressures have remained stable. We'll continue to follow this. His renal aortic ratio on the left was over 5.

## 2013-04-30 NOTE — Patient Instructions (Signed)
Follow up with Dr Allyson SabalBerry only as needed.  Continue your follow up with Dr C and your lower extremity dopplers in 6 months.

## 2013-04-30 NOTE — Assessment & Plan Note (Signed)
Status post high-speed rotational atherectomy of a highly calcified mid right SFA stenosis with an excellent angiographic and clinical result. The followup Dopplers performed 03/12/13 revealed a widely patent SFA with a right ABI of 0.93. His claudication has resolved. He does continue to smoke. We'll continue to follow this on a semiannual basis.

## 2013-05-10 ENCOUNTER — Other Ambulatory Visit: Payer: Self-pay

## 2013-05-10 MED ORDER — HYDROCODONE-ACETAMINOPHEN 7.5-325 MG PO TABS: 1.0000 | ORAL_TABLET | Freq: Two times a day (BID) | ORAL | Status: DC | PRN

## 2013-05-23 ENCOUNTER — Ambulatory Visit (INDEPENDENT_AMBULATORY_CARE_PROVIDER_SITE_OTHER): Payer: Medicare Other

## 2013-05-23 VITALS — BP 136/80 | Wt 164.0 lb

## 2013-05-23 DIAGNOSIS — D519 Vitamin B12 deficiency anemia, unspecified: Secondary | ICD-10-CM

## 2013-05-23 DIAGNOSIS — D518 Other vitamin B12 deficiency anemias: Secondary | ICD-10-CM

## 2013-05-23 MED ORDER — CYANOCOBALAMIN 1000 MCG/ML IJ SOLN
1000.0000 ug | Freq: Once | INTRAMUSCULAR | Status: AC
  Administered 2013-05-23: 1000 ug via INTRAMUSCULAR

## 2013-05-30 ENCOUNTER — Other Ambulatory Visit: Payer: Self-pay

## 2013-05-30 MED ORDER — SITAGLIPTIN PHOSPHATE 100 MG PO TABS: 100.0000 mg | ORAL_TABLET | Freq: Every day | ORAL | Status: DC

## 2013-06-12 LAB — LIPID PANEL
Cholesterol: 115 mg/dL (ref 0–200)
HDL: 28 mg/dL — ABNORMAL LOW (ref >39)
LDL CALC: 64 mg/dL (ref 0–99)
Total CHOL/HDL Ratio: 4.1 Ratio
Triglycerides: 113 mg/dL (ref <150)
VLDL: 23 mg/dL (ref 0–40)

## 2013-06-12 LAB — COMPLETE METABOLIC PANEL WITH GFR
ALBUMIN: 3.7 g/dL (ref 3.5–5.2)
ALT: 10 U/L (ref 0–53)
AST: 15 U/L (ref 0–37)
Alkaline Phosphatase: 44 U/L (ref 39–117)
BUN: 21 mg/dL (ref 6–23)
CALCIUM: 9.3 mg/dL (ref 8.4–10.5)
CHLORIDE: 97 meq/L (ref 96–112)
CO2: 26 mEq/L (ref 19–32)
Creat: 1.37 mg/dL — ABNORMAL HIGH (ref 0.50–1.35)
GFR, EST NON AFRICAN AMERICAN: 51 mL/min — AB
GFR, Est African American: 59 mL/min — ABNORMAL LOW
Glucose, Bld: 153 mg/dL — ABNORMAL HIGH (ref 70–99)
POTASSIUM: 4.8 meq/L (ref 3.5–5.3)
SODIUM: 130 meq/L — AB (ref 135–145)
TOTAL PROTEIN: 6.4 g/dL (ref 6.0–8.3)
Total Bilirubin: 0.3 mg/dL (ref 0.2–1.2)

## 2013-06-12 LAB — HEMOGLOBIN A1C
Hgb A1c MFr Bld: 8 % — ABNORMAL HIGH (ref <5.7)
Mean Plasma Glucose: 183 mg/dL — ABNORMAL HIGH (ref <117)

## 2013-06-18 ENCOUNTER — Encounter (INDEPENDENT_AMBULATORY_CARE_PROVIDER_SITE_OTHER): Payer: Self-pay

## 2013-06-18 ENCOUNTER — Ambulatory Visit (INDEPENDENT_AMBULATORY_CARE_PROVIDER_SITE_OTHER): Payer: Medicare Other | Admitting: Family Medicine

## 2013-06-18 ENCOUNTER — Encounter: Payer: Self-pay | Admitting: Family Medicine

## 2013-06-18 VITALS — BP 140/70 | HR 77 | Resp 16 | Wt 165.8 lb

## 2013-06-18 DIAGNOSIS — M899 Disorder of bone, unspecified: Secondary | ICD-10-CM

## 2013-06-18 DIAGNOSIS — M949 Disorder of cartilage, unspecified: Secondary | ICD-10-CM

## 2013-06-18 DIAGNOSIS — R5381 Other malaise: Secondary | ICD-10-CM

## 2013-06-18 DIAGNOSIS — Z5329 Procedure and treatment not carried out because of patient's decision for other reasons: Secondary | ICD-10-CM

## 2013-06-18 DIAGNOSIS — I150 Renovascular hypertension: Secondary | ICD-10-CM

## 2013-06-18 DIAGNOSIS — R5383 Other fatigue: Secondary | ICD-10-CM

## 2013-06-18 DIAGNOSIS — Z5320 Procedure and treatment not carried out because of patient's decision for unspecified reasons: Secondary | ICD-10-CM

## 2013-06-18 DIAGNOSIS — N529 Male erectile dysfunction, unspecified: Secondary | ICD-10-CM

## 2013-06-18 DIAGNOSIS — E1165 Type 2 diabetes mellitus with hyperglycemia: Secondary | ICD-10-CM

## 2013-06-18 DIAGNOSIS — N521 Erectile dysfunction due to diseases classified elsewhere: Secondary | ICD-10-CM

## 2013-06-18 DIAGNOSIS — F17200 Nicotine dependence, unspecified, uncomplicated: Secondary | ICD-10-CM

## 2013-06-18 DIAGNOSIS — E785 Hyperlipidemia, unspecified: Secondary | ICD-10-CM

## 2013-06-18 DIAGNOSIS — J42 Unspecified chronic bronchitis: Secondary | ICD-10-CM

## 2013-06-18 DIAGNOSIS — E1159 Type 2 diabetes mellitus with other circulatory complications: Secondary | ICD-10-CM

## 2013-06-18 DIAGNOSIS — IMO0002 Reserved for concepts with insufficient information to code with codable children: Secondary | ICD-10-CM

## 2013-06-18 DIAGNOSIS — I798 Other disorders of arteries, arterioles and capillaries in diseases classified elsewhere: Secondary | ICD-10-CM

## 2013-06-18 DIAGNOSIS — E109 Type 1 diabetes mellitus without complications: Secondary | ICD-10-CM

## 2013-06-18 DIAGNOSIS — E538 Deficiency of other specified B group vitamins: Secondary | ICD-10-CM

## 2013-06-18 DIAGNOSIS — Z91199 Patient's noncompliance with other medical treatment and regimen due to unspecified reason: Secondary | ICD-10-CM

## 2013-06-18 MED ORDER — SITAGLIPTIN PHOSPHATE 100 MG PO TABS: 100.0000 mg | ORAL_TABLET | Freq: Every day | ORAL | Status: DC

## 2013-06-18 MED ORDER — HYDROCODONE-ACETAMINOPHEN 7.5-325 MG PO TABS: 1.0000 | ORAL_TABLET | Freq: Two times a day (BID) | ORAL | Status: DC | PRN

## 2013-06-18 MED ORDER — CYANOCOBALAMIN 1000 MCG/ML IJ SOLN
1000.0000 ug | Freq: Once | INTRAMUSCULAR | Status: AC
  Administered 2013-06-18: 1000 ug via INTRAMUSCULAR

## 2013-06-18 MED ORDER — FLUTICASONE-SALMETEROL 100-50 MCG/DOSE IN AEPB
1.0000 | INHALATION_SPRAY | Freq: Two times a day (BID) | RESPIRATORY_TRACT | Status: DC
Start: 2013-06-18 — End: 2013-10-11

## 2013-06-18 MED ORDER — TIOTROPIUM BROMIDE MONOHYDRATE 18 MCG IN CAPS: 18.0000 ug | ORAL_CAPSULE | Freq: Every day | RESPIRATORY_TRACT | Status: DC | PRN

## 2013-06-18 MED ORDER — INSULIN LISPRO PROT & LISPRO (50-50 MIX) 100 UNIT/ML ~~LOC~~ SUSP: SUBCUTANEOUS | Status: DC

## 2013-06-18 NOTE — Patient Instructions (Addendum)
Annual wellness in 3.5 month, call if you need me beforr  Blood sugar is improved, CONGRATS, cholesterol is good  Blood pressure control is adequate  Commit to no more than 12 cigarettes daily for Spring and Summer as we discussed, this will help with breathing and with fatigue  Please return the THREE stool cards you get today for testing for hidden blood, since you still refuse the colonoscopy (I recall you are on blood thinning medication)  You are referred for patient assistance for spiriva, Advair, junuvia and humalog  You will have a meter and testing supplies for three times daily testing prescribed, PLEASE let us know the meter which is covered  Non fasting CBc, chem 7 and EGFr, hBA1C and  Vit D in 3.5 monht, before follow up visit

## 2013-06-19 ENCOUNTER — Telehealth (HOSPITAL_COMMUNITY): Payer: Self-pay | Admitting: *Deleted

## 2013-07-11 ENCOUNTER — Other Ambulatory Visit: Payer: Self-pay

## 2013-07-11 MED ORDER — HYDROCODONE-ACETAMINOPHEN 7.5-325 MG PO TABS: 1.0000 | ORAL_TABLET | Freq: Two times a day (BID) | ORAL | Status: DC | PRN

## 2013-07-19 ENCOUNTER — Other Ambulatory Visit: Payer: Self-pay

## 2013-07-19 MED ORDER — GABAPENTIN 300 MG PO CAPS: ORAL_CAPSULE | ORAL | Status: DC

## 2013-07-19 MED ORDER — METFORMIN HCL 1000 MG PO TABS: 1000.0000 mg | ORAL_TABLET | Freq: Two times a day (BID) | ORAL | Status: DC

## 2013-07-30 ENCOUNTER — Ambulatory Visit (INDEPENDENT_AMBULATORY_CARE_PROVIDER_SITE_OTHER): Payer: Medicare Other

## 2013-07-30 ENCOUNTER — Encounter (INDEPENDENT_AMBULATORY_CARE_PROVIDER_SITE_OTHER): Payer: Self-pay

## 2013-07-30 VITALS — BP 138/72 | Wt 165.0 lb

## 2013-07-30 DIAGNOSIS — D518 Other vitamin B12 deficiency anemias: Secondary | ICD-10-CM

## 2013-07-30 DIAGNOSIS — D519 Vitamin B12 deficiency anemia, unspecified: Secondary | ICD-10-CM

## 2013-07-30 MED ORDER — CYANOCOBALAMIN 1000 MCG/ML IJ SOLN
1000.0000 ug | Freq: Once | INTRAMUSCULAR | Status: AC
  Administered 2013-07-30: 1000 ug via INTRAMUSCULAR

## 2013-07-30 NOTE — Progress Notes (Signed)
Patient in for B12 injection.  Injection given in left arm.  No sign or symptom of adverse reaction noted.  No voiced complaints.  Patient aware that he should return in 30 days for next injection.

## 2013-08-19 ENCOUNTER — Telehealth (HOSPITAL_COMMUNITY): Payer: Self-pay | Admitting: *Deleted

## 2013-08-22 ENCOUNTER — Ambulatory Visit (INDEPENDENT_AMBULATORY_CARE_PROVIDER_SITE_OTHER): Payer: Medicare Other

## 2013-08-22 VITALS — BP 142/76 | Wt 173.0 lb

## 2013-08-22 DIAGNOSIS — D519 Vitamin B12 deficiency anemia, unspecified: Secondary | ICD-10-CM

## 2013-08-22 DIAGNOSIS — D518 Other vitamin B12 deficiency anemias: Secondary | ICD-10-CM

## 2013-08-23 ENCOUNTER — Other Ambulatory Visit: Payer: Self-pay

## 2013-08-23 MED ORDER — HYDROCODONE-ACETAMINOPHEN 7.5-325 MG PO TABS: 1.0000 | ORAL_TABLET | Freq: Two times a day (BID) | ORAL | Status: DC | PRN

## 2013-08-23 MED ORDER — CYANOCOBALAMIN 1000 MCG/ML IJ SOLN
1000.0000 ug | Freq: Once | INTRAMUSCULAR | Status: AC
  Administered 2013-08-22: 1000 ug via INTRAMUSCULAR

## 2013-08-30 ENCOUNTER — Telehealth (HOSPITAL_COMMUNITY): Payer: Self-pay | Admitting: *Deleted

## 2013-09-02 ENCOUNTER — Other Ambulatory Visit: Payer: Self-pay

## 2013-09-02 MED ORDER — ALPRAZOLAM 0.5 MG PO TABS: 0.5000 mg | ORAL_TABLET | Freq: Every day | ORAL | Status: DC

## 2013-09-05 ENCOUNTER — Other Ambulatory Visit: Payer: Self-pay

## 2013-09-05 DIAGNOSIS — F419 Anxiety disorder, unspecified: Secondary | ICD-10-CM

## 2013-09-05 DIAGNOSIS — F5105 Insomnia due to other mental disorder: Secondary | ICD-10-CM

## 2013-09-05 MED ORDER — TEMAZEPAM 15 MG PO CAPS: 15.0000 mg | ORAL_CAPSULE | Freq: Every evening | ORAL | Status: DC | PRN

## 2013-09-11 ENCOUNTER — Other Ambulatory Visit: Payer: Self-pay

## 2013-09-11 DIAGNOSIS — E785 Hyperlipidemia, unspecified: Secondary | ICD-10-CM

## 2013-09-11 MED ORDER — TAMSULOSIN HCL 0.4 MG PO CAPS: 0.4000 mg | ORAL_CAPSULE | Freq: Every day | ORAL | Status: DC

## 2013-09-11 MED ORDER — PRAVASTATIN SODIUM 20 MG PO TABS: 20.0000 mg | ORAL_TABLET | Freq: Every evening | ORAL | Status: DC

## 2013-09-19 ENCOUNTER — Other Ambulatory Visit: Payer: Self-pay

## 2013-09-19 MED ORDER — HYDROCODONE-ACETAMINOPHEN 7.5-325 MG PO TABS: 1.0000 | ORAL_TABLET | Freq: Two times a day (BID) | ORAL | Status: DC | PRN

## 2013-09-27 ENCOUNTER — Ambulatory Visit (INDEPENDENT_AMBULATORY_CARE_PROVIDER_SITE_OTHER): Payer: Medicare Other

## 2013-09-27 ENCOUNTER — Encounter (INDEPENDENT_AMBULATORY_CARE_PROVIDER_SITE_OTHER): Payer: Self-pay

## 2013-09-27 VITALS — BP 136/78 | Ht 70.0 in

## 2013-09-27 DIAGNOSIS — D519 Vitamin B12 deficiency anemia, unspecified: Secondary | ICD-10-CM

## 2013-09-27 DIAGNOSIS — D518 Other vitamin B12 deficiency anemias: Secondary | ICD-10-CM

## 2013-09-27 MED ORDER — CYANOCOBALAMIN 1000 MCG/ML IJ SOLN
1000.0000 ug | Freq: Once | INTRAMUSCULAR | Status: AC
  Administered 2013-09-27: 1000 ug via INTRAMUSCULAR

## 2013-10-11 ENCOUNTER — Other Ambulatory Visit: Payer: Self-pay

## 2013-10-11 MED ORDER — FLUTICASONE-SALMETEROL 100-50 MCG/DOSE IN AEPB: 1.0000 | INHALATION_SPRAY | Freq: Two times a day (BID) | RESPIRATORY_TRACT | Status: DC

## 2013-10-22 LAB — BASIC METABOLIC PANEL WITH GFR
BUN: 27 mg/dL — ABNORMAL HIGH (ref 6–23)
CALCIUM: 9.9 mg/dL (ref 8.4–10.5)
CHLORIDE: 98 meq/L (ref 96–112)
CO2: 26 mEq/L (ref 19–32)
CREATININE: 1.5 mg/dL — AB (ref 0.50–1.35)
GFR, EST NON AFRICAN AMERICAN: 46 mL/min — AB
GFR, Est African American: 53 mL/min — ABNORMAL LOW
Glucose, Bld: 150 mg/dL — ABNORMAL HIGH (ref 70–99)
Potassium: 5.4 mEq/L — ABNORMAL HIGH (ref 3.5–5.3)
Sodium: 133 mEq/L — ABNORMAL LOW (ref 135–145)

## 2013-10-22 LAB — CBC WITH DIFFERENTIAL/PLATELET
BASOS ABS: 0 10*3/uL (ref 0.0–0.1)
Basophils Relative: 0 % (ref 0–1)
EOS ABS: 0.4 10*3/uL (ref 0.0–0.7)
EOS PCT: 5 % (ref 0–5)
HEMATOCRIT: 35.7 % — AB (ref 39.0–52.0)
Hemoglobin: 12.2 g/dL — ABNORMAL LOW (ref 13.0–17.0)
LYMPHS PCT: 16 % (ref 12–46)
Lymphs Abs: 1.1 10*3/uL (ref 0.7–4.0)
MCH: 30.3 pg (ref 26.0–34.0)
MCHC: 34.2 g/dL (ref 30.0–36.0)
MCV: 88.8 fL (ref 78.0–100.0)
MONO ABS: 0.9 10*3/uL (ref 0.1–1.0)
Monocytes Relative: 13 % — ABNORMAL HIGH (ref 3–12)
Neutro Abs: 4.6 10*3/uL (ref 1.7–7.7)
Neutrophils Relative %: 66 % (ref 43–77)
Platelets: 240 10*3/uL (ref 150–400)
RBC: 4.02 MIL/uL — ABNORMAL LOW (ref 4.22–5.81)
RDW: 13.6 % (ref 11.5–15.5)
WBC: 7 10*3/uL (ref 4.0–10.5)

## 2013-10-22 LAB — HEMOGLOBIN A1C
Hgb A1c MFr Bld: 8.6 % — ABNORMAL HIGH (ref <5.7)
Mean Plasma Glucose: 200 mg/dL — ABNORMAL HIGH (ref <117)

## 2013-10-23 ENCOUNTER — Ambulatory Visit (INDEPENDENT_AMBULATORY_CARE_PROVIDER_SITE_OTHER): Payer: Medicare Other | Admitting: Family Medicine

## 2013-10-23 ENCOUNTER — Encounter (INDEPENDENT_AMBULATORY_CARE_PROVIDER_SITE_OTHER): Payer: Self-pay

## 2013-10-23 ENCOUNTER — Encounter: Payer: Self-pay | Admitting: Family Medicine

## 2013-10-23 VITALS — BP 150/74 | HR 78 | Resp 16 | Ht 69.0 in | Wt 162.0 lb

## 2013-10-23 DIAGNOSIS — D519 Vitamin B12 deficiency anemia, unspecified: Secondary | ICD-10-CM

## 2013-10-23 DIAGNOSIS — IMO0001 Reserved for inherently not codable concepts without codable children: Secondary | ICD-10-CM

## 2013-10-23 DIAGNOSIS — E785 Hyperlipidemia, unspecified: Secondary | ICD-10-CM

## 2013-10-23 DIAGNOSIS — J209 Acute bronchitis, unspecified: Secondary | ICD-10-CM

## 2013-10-23 DIAGNOSIS — D518 Other vitamin B12 deficiency anemias: Secondary | ICD-10-CM

## 2013-10-23 DIAGNOSIS — E1065 Type 1 diabetes mellitus with hyperglycemia: Secondary | ICD-10-CM

## 2013-10-23 DIAGNOSIS — IMO0002 Reserved for concepts with insufficient information to code with codable children: Secondary | ICD-10-CM

## 2013-10-23 DIAGNOSIS — Z0000 Encounter for general adult medical examination without abnormal findings: Secondary | ICD-10-CM

## 2013-10-23 DIAGNOSIS — E1165 Type 2 diabetes mellitus with hyperglycemia: Secondary | ICD-10-CM

## 2013-10-23 DIAGNOSIS — E538 Deficiency of other specified B group vitamins: Secondary | ICD-10-CM

## 2013-10-23 DIAGNOSIS — Z794 Long term (current) use of insulin: Secondary | ICD-10-CM

## 2013-10-23 LAB — VITAMIN D 25 HYDROXY (VIT D DEFICIENCY, FRACTURES): Vit D, 25-Hydroxy: 76 ng/mL (ref 30–89)

## 2013-10-23 MED ORDER — CYANOCOBALAMIN 1000 MCG/ML IJ SOLN
1000.0000 ug | Freq: Once | INTRAMUSCULAR | Status: AC
  Administered 2013-10-23: 1000 ug via INTRAMUSCULAR

## 2013-10-23 MED ORDER — HYDROCODONE-ACETAMINOPHEN 7.5-325 MG PO TABS: 1.0000 | ORAL_TABLET | Freq: Two times a day (BID) | ORAL | Status: DC | PRN

## 2013-10-23 MED ORDER — AMOXICILLIN-POT CLAVULANATE 875-125 MG PO TABS: 1.0000 | ORAL_TABLET | Freq: Two times a day (BID) | ORAL | Status: DC

## 2013-10-23 NOTE — Patient Instructions (Addendum)
F/u in 3 month, call if you need me before  You are treated today for acute bronchitis, you should feel better by next week, take all medication prescribed  Come first week in October for flu vaccine  Blood sugar is increased, NOVOLOG will be requested when next due per your request, take between 10 to 35 units twice daily as you have done in the past, depending on your blood sugar  BP today is high, no med change however  Miicroalb, fasting lipid, cmp and EGFR, hBA1C in 3 months  Increase water intake, you are a bit dehydrated  B12 today

## 2013-10-23 NOTE — Progress Notes (Signed)
Subjective:    Patient ID: Donald Everett, male    DOB: 1941/02/14, 73 y.o.   MRN: [id number]  HPI  Preventive Screening-Counseling & Management   Patient present here today for a subsequent Medicare annual wellness visit. C/o 5 day h/o increased cough and chest congestion with yellow sputum at times and intermittent chills. No documented fever. C/o fatigue and states blood sugars have been running high, states he is not surprised as he is convinced that the insulin product that he uses has a great deal today with how he responds, change in product is being requested when next due per his specific request   Current Problems (verified)   Medications Prior to Visit Allergies (verified)   PAST HISTORY  Family History (verified)   Social History Divorced, lives alone. Has 3 children. Current smoker    Risk Factors  Current exercise habits: Walks a few days a week for 15 minutes   Dietary issues discussed: Low fat, limit fried fatty foods and eat more fruits and vegetables. Limit carb intake    Cardiac risk factors: dm , nicotine use  Depression Screen  (Note: if answer to either of the following is "Yes", a more complete depression screening is indicated)   Over the past two weeks, have you felt down, depressed or hopeless? Feels down, has no energy  Over the past two weeks, have you felt little interest or pleasure in doing things? No  Have you lost interest or pleasure in daily life? No  Do you often feel hopeless? No  Do you cry easily over simple problems? No   Activities of Daily Living  In your present state of health, do you have any difficulty performing the following activities?  Driving?: No Managing money?: No Feeding yourself?:No Getting from bed to chair?:No Climbing a flight of stairs?: avoid stairs when possible Preparing food and eating?:No Bathing or showering?:No Getting dressed?:No Getting to the toilet?:No Using the toilet?:No Moving around  from place to place?: No  Fall Risk Assessment In the past year have you fallen or had a near fall?:No Are you currently taking any medications that make you dizzy?:No   Hearing Difficulties: No Do you often ask people to speak up or repeat themselves?: sometimes  Do you experience ringing or noises in your ears?:No Do you have difficulty understanding soft or whispered voices?: sometimes   Cognitive Testing  Alert? Yes Normal Appearance?Yes  Oriented to person? Yes Place? Yes  Time? Yes  Displays appropriate judgment?Yes  Can read the correct time from a watch face? yes Are you having problems remembering things? Sometimes  Advanced Directives have been discussed with the patient?Yes, brochure given. Full code at this time   List the Names of Other Physician/Practitioners you currently use: listed    Indicate any recent Medical Services you may have received from other than [facility] providers in the past year (date may be approximate).   Assessment:    Annual Wellness Exam   Plan:    .  Medicare Attestation  I have personally reviewed:  The patient's medical and social history  Their use of alcohol, tobacco or illicit drugs  Their current medications and supplements  The patient's functional ability including ADLs,fall risks, home safety risks, cognitive, and hearing and visual impairment  Diet and physical activities  Evidence for depression or mood disorders  The patient's weight, height, BMI, and visual acuity have been recorded in the chart. I have made referrals, counseling, and provided education to the  patient based on review of the above and I have provided the patient with a written personalized care plan for preventive services.     Review of Systems     Objective:   Physical Exam BP 150/74  Pulse 78  Resp 16  Ht  (1.753 m)  Wt 162 lb (73.483 kg)  BMI 23.91 kg/m2  SpO2 96% Patient alert and oriented and in no cardiopulmonary distress.Mildly ill  appearing  HEENT: No facial asymmetry, EOMI,   oropharynx pink and moist.  Neck adequate ROM though reduced, no JVD, no mass.  Chest: decreased air entry, though adequate ,  Bibasilar crackles, no wheezes  CVS: S1, S2 no murmurs, no S3.Regular rate.  ABD: Soft non tender.   Ext: No edema       Assessment & Plan:  Medicare annual wellness visit, subsequent Annual exam as documented. Counseling done  re healthy lifestyle involving commitment to 150 minutes exercise per week, heart healthy diet, and attaining healthy weight.The importance of adequate sleep also discussed. Regular seat belt use , is also discussed. Changes in health habits are decided on by the patient with goals and time frames  set for achieving them. Immunization and cancer screening needs are specifically addressed at this visit.Clontiniuesw to refuse colonoscopy and will return for flu vaccine when well   Diabetes mellitus, insulin dependent (IDDM), uncontrolled Deteriorated, pt feels NOVOLoG is the only insulin that helps him, will change from humalog Patient advised to reduce carb and sweets, commit to regular physical activity, take meds as prescribed, test blood as directed, and attempt to lose weight, to improve blood sugar control. No change in medciaton dose as pt actually self adjusts his insulin   Acute bronchitis Antibiotic prescribed, already has  decongestant  B12 deficiency anemia Scheduled injection given during this visit

## 2013-10-23 NOTE — Assessment & Plan Note (Addendum)
Annual exam as documented. Counseling done  re healthy lifestyle involving commitment to 150 minutes exercise per week, heart healthy diet, and attaining healthy weight.The importance of adequate sleep also discussed. Regular seat belt use , is also discussed. Changes in health habits are decided on by the patient with goals and time frames  set for achieving them. Immunization and cancer screening needs are specifically addressed at this visit.Clontiniuesw to refuse colonoscopy and will return for flu vaccine when well

## 2013-10-23 NOTE — Assessment & Plan Note (Addendum)
Deteriorated, pt feels NOVOLoG is the only insulin that helps him, will change from humalog Patient advised to reduce carb and sweets, commit to regular physical activity, take meds as prescribed, test blood as directed, and attempt to lose weight, to improve blood sugar control. No change in medciaton dose as pt actually self adjusts his insulin

## 2013-10-27 ENCOUNTER — Encounter: Payer: Self-pay | Admitting: Family Medicine

## 2013-10-27 NOTE — Assessment & Plan Note (Signed)
Scheduled injection given during this visit

## 2013-10-27 NOTE — Assessment & Plan Note (Signed)
Antibiotic prescribed, already has decongestant 

## 2013-10-27 NOTE — Assessment & Plan Note (Addendum)
Improved, pt congratulated on this Patient advised to reduce carb and sweets, commit to regular physical activity, take meds as prescribed, test blood as directed, and attempt to lose weight, to improve blood sugar control. Updated lab needed at/ before next visit.

## 2013-10-27 NOTE — Assessment & Plan Note (Signed)
Good response to regular spiriva and symbicort, though his disease is progressing due to ongoing nicotine use. No med change

## 2013-10-27 NOTE — Assessment & Plan Note (Signed)
Controlled, no change in medication  

## 2013-10-27 NOTE — Progress Notes (Signed)
   Subjective:    Patient ID: Donald Everett, male    DOB: 03/27/1940, 73 y.o.   MRN: [id number]  HPI The PT is here for follow up and re-evaluation of chronic medical conditions, medication management and review of any available recent lab and radiology data.  Preventive health is updated, specifically  Cancer screening and Immunization.   Questions or concerns regarding consultations or procedures which the PT has had in the interim are  addressed. The PT denies any adverse reactions to current medications since the last visit.  There are no new concerns.  There are no specific complaints       Review of Systems See HPI Denies recent fever or chills. Denies sinus pressure, nasal congestion, ear pain or sore throat. Denies chest congestion, productive cough, does have shortness of breath with activity and some wheezing Denies chest pains, palpitations, pNd, orthopnea and leg swelling. C/o chronic fatigue and  Poor exercise tolerance Denies abdominal pain, nausea, vomiting,diarrhea or constipation.   Denies dysuria, frequency, hesitancy or incontinence.  C/o chronic back pain and stiffness which limits his mobility at times Denies headaches, seizures, numbness, or tingling. Denies uncontrolled anxiety or insomnia.Takes  Medication for this Denies skin break down or rash.        Objective:   Physical Exam BP 140/70  Pulse 77  Resp 16  Wt 165 lb 12.8 oz (75.206 kg)  SpO2 99% Patient alert and oriented and in no cardiopulmonary distress.  HEENT: No facial asymmetry, EOMI,   oropharynx pink and moist.  Neck supple no JVD, no mass.  Chest: Clear to auscultation bilaterally.Decreased though adequate air entry  CVS: S1, S2 no murmurs, no S3.Regular rate.  ABD: Soft non tender.   Ext: No edema  MS: Adequate though reduced  ROM spine, normal in  shoulders, hips and knees.  Skin: Intact, no ulcerations or rash noted.  Psych: Good eye contact, normal affect. Memory intact  not anxious or depressed appearing.  CNS: CN 2-12 intact, power,  normal throughout.no focal deficits noted.        Assessment & Plan:  Diabetes mellitus, insulin dependent (IDDM), uncontrolled Improved, pt congratulated on this Patient advised to reduce carb and sweets, commit to regular physical activity, take meds as prescribed, test blood as directed, and attempt to lose weight, to improve blood sugar control. Updated lab needed at/ before next visit.    HYPERLIPIDEMIA LDL at goal, increased physical activity encouraged to improve hDL, noi med change Hyperlipidemia:Low fat diet discussed and encouraged.    NICOTINE ADDICTION Smoking less, no commitment at thsi time, but will try to continue  reduce usage Patient counseled for approximately 5 minutes regarding the health risks of ongoing nicotine use, specifically all types of cancer, heart disease, stroke and respiratory failure. The options available for help with cessation ,the behavioral changes to assist the process, and the option to either gradully reduce usage  Or abruptly stop.is also discussed. Pt is also encouraged to set specific goals in number of cigarettes used daily, as well as to set a quit date.   Patient non-compliant, refused service stoll cards provided for pt to return for colon screen, refuses colonoscopy  Renovascular hypertension Controlled, no change in medication   COPD (chronic obstructive pulmonary disease) Good response to regular spiriva and symbicort, though his disease is progressing due to ongoing nicotine use. No med change

## 2013-10-27 NOTE — Assessment & Plan Note (Signed)
stoll cards provided for pt to return for colon screen, refuses colonoscopy

## 2013-10-27 NOTE — Assessment & Plan Note (Signed)
Smoking less, no commitment at thsi time, but will try to continue  reduce usage Patient counseled for approximately 5 minutes regarding the health risks of ongoing nicotine use, specifically all types of cancer, heart disease, stroke and respiratory failure. The options available for help with cessation ,the behavioral changes to assist the process, and the option to either gradully reduce usage  Or abruptly stop.is also discussed. Pt is also encouraged to set specific goals in number of cigarettes used daily, as well as to set a quit date.

## 2013-10-27 NOTE — Assessment & Plan Note (Signed)
LDL at goal, increased physical activity encouraged to improve hDL, noi med change Hyperlipidemia:Low fat diet discussed and encouraged.

## 2013-11-20 ENCOUNTER — Other Ambulatory Visit: Payer: Self-pay

## 2013-11-20 MED ORDER — HYDROCODONE-ACETAMINOPHEN 7.5-325 MG PO TABS: 1.0000 | ORAL_TABLET | Freq: Two times a day (BID) | ORAL | Status: DC | PRN

## 2013-11-28 ENCOUNTER — Ambulatory Visit (INDEPENDENT_AMBULATORY_CARE_PROVIDER_SITE_OTHER): Payer: Medicare Other

## 2013-11-28 VITALS — BP 140/76 | Wt 164.0 lb

## 2013-11-28 DIAGNOSIS — D519 Vitamin B12 deficiency anemia, unspecified: Secondary | ICD-10-CM

## 2013-11-28 DIAGNOSIS — Z23 Encounter for immunization: Secondary | ICD-10-CM

## 2013-11-28 MED ORDER — CYANOCOBALAMIN 1000 MCG/ML IJ SOLN
1000.0000 ug | Freq: Once | INTRAMUSCULAR | Status: AC
  Administered 2013-11-28: 1000 ug via INTRAMUSCULAR

## 2013-11-28 NOTE — Progress Notes (Signed)
Patient in for monthly injection of B12.  Injection given IM with no voiced complaints.  Patient aware that he should return in 30 days for next injection.   Flu injection given as well in left arm.

## 2013-12-04 ENCOUNTER — Other Ambulatory Visit: Payer: Self-pay

## 2013-12-04 DIAGNOSIS — K219 Gastro-esophageal reflux disease without esophagitis: Secondary | ICD-10-CM

## 2013-12-04 MED ORDER — CLOPIDOGREL BISULFATE 75 MG PO TABS: 75.0000 mg | ORAL_TABLET | Freq: Every day | ORAL | Status: DC

## 2013-12-04 MED ORDER — LOSARTAN POTASSIUM 100 MG PO TABS: 100.0000 mg | ORAL_TABLET | Freq: Every day | ORAL | Status: DC

## 2013-12-04 MED ORDER — PANTOPRAZOLE SODIUM 40 MG PO TBEC: 40.0000 mg | DELAYED_RELEASE_TABLET | Freq: Every day | ORAL | Status: DC

## 2013-12-05 ENCOUNTER — Other Ambulatory Visit: Payer: Self-pay

## 2013-12-05 DIAGNOSIS — K219 Gastro-esophageal reflux disease without esophagitis: Secondary | ICD-10-CM

## 2013-12-05 MED ORDER — PANTOPRAZOLE SODIUM 40 MG PO TBEC: 40.0000 mg | DELAYED_RELEASE_TABLET | Freq: Every day | ORAL | Status: DC

## 2013-12-05 MED ORDER — CLOPIDOGREL BISULFATE 75 MG PO TABS: 75.0000 mg | ORAL_TABLET | Freq: Every day | ORAL | Status: DC

## 2013-12-16 ENCOUNTER — Other Ambulatory Visit: Payer: Self-pay

## 2013-12-16 DIAGNOSIS — IMO0001 Reserved for inherently not codable concepts without codable children: Secondary | ICD-10-CM

## 2013-12-16 DIAGNOSIS — Z794 Long term (current) use of insulin: Principal | ICD-10-CM

## 2013-12-16 DIAGNOSIS — E1165 Type 2 diabetes mellitus with hyperglycemia: Principal | ICD-10-CM

## 2013-12-16 MED ORDER — INSULIN ASPART PROT & ASPART (70-30 MIX) 100 UNIT/ML ~~LOC~~ SUSP: 35.0000 [IU] | Freq: Two times a day (BID) | SUBCUTANEOUS | Status: DC

## 2013-12-20 ENCOUNTER — Other Ambulatory Visit: Payer: Self-pay

## 2013-12-20 MED ORDER — HYDROCODONE-ACETAMINOPHEN 7.5-325 MG PO TABS: 1.0000 | ORAL_TABLET | Freq: Two times a day (BID) | ORAL | Status: DC | PRN

## 2013-12-25 ENCOUNTER — Ambulatory Visit: Payer: Medicare Other

## 2013-12-26 ENCOUNTER — Encounter (INDEPENDENT_AMBULATORY_CARE_PROVIDER_SITE_OTHER): Payer: Self-pay

## 2013-12-26 ENCOUNTER — Ambulatory Visit (INDEPENDENT_AMBULATORY_CARE_PROVIDER_SITE_OTHER): Payer: Medicare Other

## 2013-12-26 VITALS — BP 136/80

## 2013-12-26 DIAGNOSIS — D519 Vitamin B12 deficiency anemia, unspecified: Secondary | ICD-10-CM

## 2013-12-26 MED ORDER — CYANOCOBALAMIN 1000 MCG/ML IJ SOLN
1000.0000 ug | Freq: Once | INTRAMUSCULAR | Status: AC
  Administered 2013-12-26: 1000 ug via INTRAMUSCULAR

## 2013-12-26 NOTE — Progress Notes (Signed)
Patient in for maintenance b12 injection.   Injection given IM in left deltoid.  No voiced complaints.   Patient aware that he should return for next injection in 30 days

## 2013-12-30 ENCOUNTER — Telehealth: Payer: Self-pay

## 2013-12-31 NOTE — Telephone Encounter (Signed)
Pt called wanting to speak with a nurse about his insulin, pt is concerned if the nurse has got in touch with his insurance company. Please advise

## 2013-12-31 NOTE — Telephone Encounter (Signed)
Patient states that insurance needs to know why he was changed from Humalog to Novolog. Gave [phone] as contact #

## 2014-01-17 ENCOUNTER — Other Ambulatory Visit: Payer: Self-pay | Admitting: Family Medicine

## 2014-01-17 LAB — LIPID PANEL
CHOL/HDL RATIO: 3.9 ratio
Cholesterol: 121 mg/dL (ref 0–200)
HDL: 31 mg/dL — ABNORMAL LOW (ref >39)
LDL CALC: 65 mg/dL (ref 0–99)
Triglycerides: 127 mg/dL (ref <150)
VLDL: 25 mg/dL (ref 0–40)

## 2014-01-17 LAB — MICROALBUMIN / CREATININE URINE RATIO
Creatinine, Urine: 53.8 mg/dL
MICROALB/CREAT RATIO: 31.6 mg/g — AB (ref 0.0–30.0)
Microalb, Ur: 1.7 mg/dL (ref <2.0)

## 2014-01-17 LAB — COMPLETE METABOLIC PANEL WITH GFR
ALK PHOS: 42 U/L (ref 39–117)
ALT: 11 U/L (ref 0–53)
AST: 19 U/L (ref 0–37)
Albumin: 3.7 g/dL (ref 3.5–5.2)
BILIRUBIN TOTAL: 0.3 mg/dL (ref 0.2–1.2)
BUN: 18 mg/dL (ref 6–23)
CO2: 27 mEq/L (ref 19–32)
CREATININE: 1.15 mg/dL (ref 0.50–1.35)
Calcium: 9.5 mg/dL (ref 8.4–10.5)
Chloride: 97 mEq/L (ref 96–112)
GFR, Est African American: 73 mL/min
GFR, Est Non African American: 63 mL/min
Glucose, Bld: 165 mg/dL — ABNORMAL HIGH (ref 70–99)
Potassium: 5.2 mEq/L (ref 3.5–5.3)
SODIUM: 134 meq/L — AB (ref 135–145)
TOTAL PROTEIN: 6.4 g/dL (ref 6.0–8.3)

## 2014-01-18 LAB — HEMOGLOBIN A1C
HEMOGLOBIN A1C: 8.3 % — AB (ref <5.7)
MEAN PLASMA GLUCOSE: 192 mg/dL — AB (ref <117)

## 2014-01-20 ENCOUNTER — Other Ambulatory Visit: Payer: Self-pay

## 2014-01-20 MED ORDER — HYDROCODONE-ACETAMINOPHEN 7.5-325 MG PO TABS: 1.0000 | ORAL_TABLET | Freq: Two times a day (BID) | ORAL | Status: DC | PRN

## 2014-01-22 ENCOUNTER — Encounter: Payer: Self-pay | Admitting: Family Medicine

## 2014-01-22 ENCOUNTER — Ambulatory Visit (INDEPENDENT_AMBULATORY_CARE_PROVIDER_SITE_OTHER): Payer: Medicare Other | Admitting: Family Medicine

## 2014-01-22 VITALS — BP 140/70 | HR 76 | Resp 18 | Ht 69.0 in | Wt 166.0 lb

## 2014-01-22 DIAGNOSIS — I150 Renovascular hypertension: Secondary | ICD-10-CM

## 2014-01-22 DIAGNOSIS — Z794 Long term (current) use of insulin: Principal | ICD-10-CM

## 2014-01-22 DIAGNOSIS — K219 Gastro-esophageal reflux disease without esophagitis: Secondary | ICD-10-CM

## 2014-01-22 DIAGNOSIS — F17200 Nicotine dependence, unspecified, uncomplicated: Secondary | ICD-10-CM

## 2014-01-22 DIAGNOSIS — F419 Anxiety disorder, unspecified: Secondary | ICD-10-CM

## 2014-01-22 DIAGNOSIS — Z720 Tobacco use: Secondary | ICD-10-CM

## 2014-01-22 DIAGNOSIS — IMO0001 Reserved for inherently not codable concepts without codable children: Secondary | ICD-10-CM

## 2014-01-22 DIAGNOSIS — D519 Vitamin B12 deficiency anemia, unspecified: Secondary | ICD-10-CM

## 2014-01-22 DIAGNOSIS — J302 Other seasonal allergic rhinitis: Secondary | ICD-10-CM

## 2014-01-22 DIAGNOSIS — IMO0002 Reserved for concepts with insufficient information to code with codable children: Secondary | ICD-10-CM

## 2014-01-22 DIAGNOSIS — N521 Erectile dysfunction due to diseases classified elsewhere: Secondary | ICD-10-CM

## 2014-01-22 DIAGNOSIS — Z23 Encounter for immunization: Secondary | ICD-10-CM

## 2014-01-22 DIAGNOSIS — E1065 Type 1 diabetes mellitus with hyperglycemia: Secondary | ICD-10-CM

## 2014-01-22 DIAGNOSIS — E1159 Type 2 diabetes mellitus with other circulatory complications: Secondary | ICD-10-CM

## 2014-01-22 DIAGNOSIS — J449 Chronic obstructive pulmonary disease, unspecified: Secondary | ICD-10-CM

## 2014-01-22 DIAGNOSIS — F5105 Insomnia due to other mental disorder: Secondary | ICD-10-CM

## 2014-01-22 DIAGNOSIS — E1165 Type 2 diabetes mellitus with hyperglycemia: Principal | ICD-10-CM

## 2014-01-22 DIAGNOSIS — E785 Hyperlipidemia, unspecified: Secondary | ICD-10-CM

## 2014-01-22 MED ORDER — CYANOCOBALAMIN 1000 MCG/ML IJ SOLN
1000.0000 ug | Freq: Once | INTRAMUSCULAR | Status: AC
  Administered 2014-01-22: 1000 ug via INTRAMUSCULAR

## 2014-01-22 NOTE — Assessment & Plan Note (Signed)
Vaccine administered at visit.  

## 2014-01-22 NOTE — Assessment & Plan Note (Signed)
Adeqaute, but sub optimal control DASH diet and commitment to daily physical activity for a minimum of 30 minutes discussed and encouraged, as a part of hypertension management. The importance of attaining a healthy weight is also discussed.

## 2014-01-22 NOTE — Telephone Encounter (Signed)
Disregard

## 2014-01-22 NOTE — Assessment & Plan Note (Signed)
1cc B12 administered IM at visit

## 2014-01-22 NOTE — Patient Instructions (Addendum)
F/u in 3.5 month, call if you need me before  Foot exam today qualifies you for diabetic shoes  Pls work on reducing to 15 cigarettes per day  In the next 3.5 month   Prevnar today and B12   Labs are improved, CONGRATS  Non fast chem 7 and EGFR, hBA1C and cBc in 3.5 month  You are referred for eye exam in [place]

## 2014-01-22 NOTE — Assessment & Plan Note (Signed)
Sleep hygiene reviewed and written information offered also. Prescription sent for  medication needed.  

## 2014-01-22 NOTE — Assessment & Plan Note (Signed)
Unchanged. Patient counseled for approximately 5 minutes regarding the health risks of ongoing nicotine use, specifically all types of cancer, heart disease, stroke and respiratory failure. The options available for help with cessation ,the behavioral changes to assist the process, and the option to either gradully reduce usage  Or abruptly stop.is also discussed. Pt is also encouraged to set specific goals in number of cigarettes used daily, as well as to set a quit date.  

## 2014-01-22 NOTE — Assessment & Plan Note (Signed)
Ongoing nicotine use is resulting in worsening COPD, cessation counseling done, current is 1PPD, working on cutting back

## 2014-01-22 NOTE — Assessment & Plan Note (Signed)
Controlled, no change in medication  

## 2014-01-22 NOTE — Progress Notes (Signed)
Subjective:    Patient ID: Donald Everett, male    DOB: 02/16/1940, [age] y.o.   MRN: [id number]  HPI The PT is here for follow up and re-evaluation of chronic medical conditions, medication management and review of any available recent lab and radiology data.  Preventive health is updated, specifically  Cancer screening and Immunization.  Still refusing colonoscopy and rectal exam and states he "was not able" to collect stool sample for card, will consider rectal exam for next visit Still needs eye exam . The PT denies any adverse reactions to current medications since the last visit.  There are no new concerns.  There are no specific complaints  Denies polyuria, polydipsia, blurred vision , or hypoglycemic episodes.       Review of Systems See HPI Denies recent fever or chills. Denies sinus pressure, nasal congestion, ear pain or sore throat. Denies chest congestion, productive cough or wheezing. Denies chest pains, palpitations and leg swelling Denies abdominal pain, nausea, vomiting,diarrhea or constipation.   Denies dysuria, frequency, hesitancy or incontinence.voids on avg 3 times at night Chronic back pain unchanged with numbness of the  feet. Denies headaches, seizures, numbness, or tingling. Denies depression, uncontrolled  anxiety or insomnia. Denies skin break down or rash.        Objective:   Physical Exam  BP 140/70 mmHg  Pulse 76  Resp 18  Ht 5\' 9"  (1.753 m)  Wt 166 lb 0.6 oz (75.315 kg)  BMI 24.51 kg/m2  SpO2 95% Patient alert and oriented and in no cardiopulmonary distress.  HEENT: No facial asymmetry, EOMI,   oropharynx pink and moist.  Neck supple no JVD, no mass.  Chest: Clear to auscultation bilaterally.However air entry is reduced  CVS: S1, S2 no murmurs, no S3.Regular rate.  ABD: Soft non tender.   Ext: No edema  MS: Adequate though reduced  ROM spine, shoulders, hips and knees.  Skin: Intact, no ulcerations or rash noted.  Psych:  Good eye contact, normal affect. Memory intact not anxious or depressed appearing.  CNS: CN 2-12 intact, power,  normal throughout.no focal deficits noted.       Assessment & Plan:  Renovascular hypertension Adeqaute, but sub optimal control DASH diet and commitment to daily physical activity for a minimum of 30 minutes discussed and encouraged, as a part of hypertension management. The importance of attaining a healthy weight is also discussed.   COPD (chronic obstructive pulmonary disease) Ongoing nicotine use is resulting in worsening COPD, cessation counseling done, current is 1PPD, working on cutting back  Diabetes mellitus, insulin dependent (IDDM), uncontrolled Improved, though [facility] at [email or web address] Updated lab needed at/ before next visit.   B12 deficiency anemia 1cc B12 administered IM at visit  Insomnia secondary to anxiety Sleep hygiene reviewed and written information offered also. Prescription sent for  medication needed.   NICOTINE ADDICTION Unchanged Patient counseled for approximately 5 minutes regarding the health risks of ongoing nicotine use, specifically all types of cancer, heart disease, stroke and respiratory failure. The options available for help with cessation ,the behavioral changes to assist the process, and the option to either gradully reduce usage  Or abruptly stop.is also discussed. Pt is also encouraged to set specific goals in number of cigarettes used daily, as well as to set a quit date.   Hyperlipidemia LDL goal <100 Controlled, no change in medication  HDL slightly improved, increased exercise encouraged as tolerated  Need for vaccination with 13-polyvalent pneumococcal conjugate vaccine Vaccine administered at  visit.   Seasonal allergies Controlled, no change in medication   GERD (gastroesophageal reflux disease) Controlled, no change in medication

## 2014-01-22 NOTE — Assessment & Plan Note (Signed)
Improved, though still not [email or web address] Updated lab needed at/ before next visit.

## 2014-01-22 NOTE — Assessment & Plan Note (Signed)
Controlled, no change in medication  HDL slightly improved, increased exercise encouraged as tolerated

## 2014-01-23 ENCOUNTER — Encounter (HOSPITAL_COMMUNITY): Payer: Self-pay | Admitting: Cardiovascular Disease

## 2014-01-24 LAB — PSA, MEDICARE: PSA: 1.18 ng/mL (ref <=4.00)

## 2014-02-21 ENCOUNTER — Other Ambulatory Visit: Payer: Self-pay

## 2014-02-21 MED ORDER — HYDROCODONE-ACETAMINOPHEN 7.5-325 MG PO TABS: 1.0000 | ORAL_TABLET | Freq: Two times a day (BID) | ORAL | Status: DC | PRN

## 2014-02-24 ENCOUNTER — Telehealth: Payer: Self-pay

## 2014-02-24 ENCOUNTER — Ambulatory Visit (INDEPENDENT_AMBULATORY_CARE_PROVIDER_SITE_OTHER): Payer: PPO

## 2014-02-24 ENCOUNTER — Encounter (INDEPENDENT_AMBULATORY_CARE_PROVIDER_SITE_OTHER): Payer: Self-pay

## 2014-02-24 VITALS — Wt 166.0 lb

## 2014-02-24 DIAGNOSIS — D519 Vitamin B12 deficiency anemia, unspecified: Secondary | ICD-10-CM

## 2014-02-24 MED ORDER — CYANOCOBALAMIN 1000 MCG/ML IJ SOLN
1000.0000 ug | Freq: Once | INTRAMUSCULAR | Status: AC
  Administered 2014-02-24: 1000 ug via INTRAMUSCULAR

## 2014-02-24 NOTE — Progress Notes (Signed)
Monthly b12 given in left deltoid with no complications. Return in 16month for next shot

## 2014-02-24 NOTE — Telephone Encounter (Signed)
Called and spoke with patient and he will come in and have injection

## 2014-03-11 ENCOUNTER — Other Ambulatory Visit: Payer: Self-pay

## 2014-03-11 ENCOUNTER — Telehealth: Payer: Self-pay | Admitting: *Deleted

## 2014-03-11 DIAGNOSIS — E1165 Type 2 diabetes mellitus with hyperglycemia: Principal | ICD-10-CM

## 2014-03-11 DIAGNOSIS — F419 Anxiety disorder, unspecified: Secondary | ICD-10-CM

## 2014-03-11 DIAGNOSIS — Z794 Long term (current) use of insulin: Principal | ICD-10-CM

## 2014-03-11 DIAGNOSIS — E785 Hyperlipidemia, unspecified: Secondary | ICD-10-CM

## 2014-03-11 DIAGNOSIS — F5105 Insomnia due to other mental disorder: Secondary | ICD-10-CM

## 2014-03-11 DIAGNOSIS — K219 Gastro-esophageal reflux disease without esophagitis: Secondary | ICD-10-CM

## 2014-03-11 DIAGNOSIS — IMO0001 Reserved for inherently not codable concepts without codable children: Secondary | ICD-10-CM

## 2014-03-11 MED ORDER — METFORMIN HCL 1000 MG PO TABS: 1000.0000 mg | ORAL_TABLET | Freq: Two times a day (BID) | ORAL | Status: DC

## 2014-03-11 MED ORDER — GABAPENTIN 300 MG PO CAPS: ORAL_CAPSULE | ORAL | Status: DC

## 2014-03-11 MED ORDER — TAMSULOSIN HCL 0.4 MG PO CAPS: 0.4000 mg | ORAL_CAPSULE | Freq: Every day | ORAL | Status: DC

## 2014-03-11 MED ORDER — ALPRAZOLAM 0.5 MG PO TABS: 0.5000 mg | ORAL_TABLET | Freq: Every day | ORAL | Status: DC

## 2014-03-11 MED ORDER — TEMAZEPAM 15 MG PO CAPS: 15.0000 mg | ORAL_CAPSULE | Freq: Every evening | ORAL | Status: DC | PRN

## 2014-03-11 MED ORDER — FLUTICASONE-SALMETEROL 100-50 MCG/DOSE IN AEPB: 1.0000 | INHALATION_SPRAY | Freq: Two times a day (BID) | RESPIRATORY_TRACT | Status: DC

## 2014-03-11 MED ORDER — PRAVASTATIN SODIUM 20 MG PO TABS: 20.0000 mg | ORAL_TABLET | Freq: Every evening | ORAL | Status: DC

## 2014-03-11 MED ORDER — FLUTICASONE PROPIONATE 50 MCG/ACT NA SUSP: 1.0000 | Freq: Every day | NASAL | Status: DC

## 2014-03-11 MED ORDER — PANTOPRAZOLE SODIUM 40 MG PO TBEC: 40.0000 mg | DELAYED_RELEASE_TABLET | Freq: Every day | ORAL | Status: DC

## 2014-03-11 MED ORDER — TIOTROPIUM BROMIDE MONOHYDRATE 18 MCG IN CAPS: 18.0000 ug | ORAL_CAPSULE | Freq: Every day | RESPIRATORY_TRACT | Status: DC | PRN

## 2014-03-11 MED ORDER — SPIRONOLACTONE 50 MG PO TABS: 50.0000 mg | ORAL_TABLET | Freq: Two times a day (BID) | ORAL | Status: DC

## 2014-03-11 MED ORDER — CLOPIDOGREL BISULFATE 75 MG PO TABS: 75.0000 mg | ORAL_TABLET | Freq: Every day | ORAL | Status: DC

## 2014-03-11 MED ORDER — LOSARTAN POTASSIUM 100 MG PO TABS: 100.0000 mg | ORAL_TABLET | Freq: Every day | ORAL | Status: DC

## 2014-03-11 MED ORDER — SITAGLIPTIN PHOSPHATE 100 MG PO TABS: 100.0000 mg | ORAL_TABLET | Freq: Every day | ORAL | Status: DC

## 2014-03-11 MED ORDER — INSULIN ASPART PROT & ASPART (70-30 MIX) 100 UNIT/ML ~~LOC~~ SUSP: 35.0000 [IU] | Freq: Two times a day (BID) | SUBCUTANEOUS | Status: DC

## 2014-03-11 NOTE — Telephone Encounter (Signed)
Confirmed with patient and 90 day supplies sent to pharmacy

## 2014-03-11 NOTE — Telephone Encounter (Signed)
Pt called requesting all of his RX to be switched over to [facility].

## 2014-03-13 IMAGING — CR DG CHEST 2V
2 series · 2 of 2 positions shown · non-contrast
Comparison: CT 08/28/2012.  Chest x-ray 02/07/2012.

CLINICAL DATA: Preprocedure evaluation

EXAM:
CHEST  2 VIEW

[view not recorded (1 of 2)]
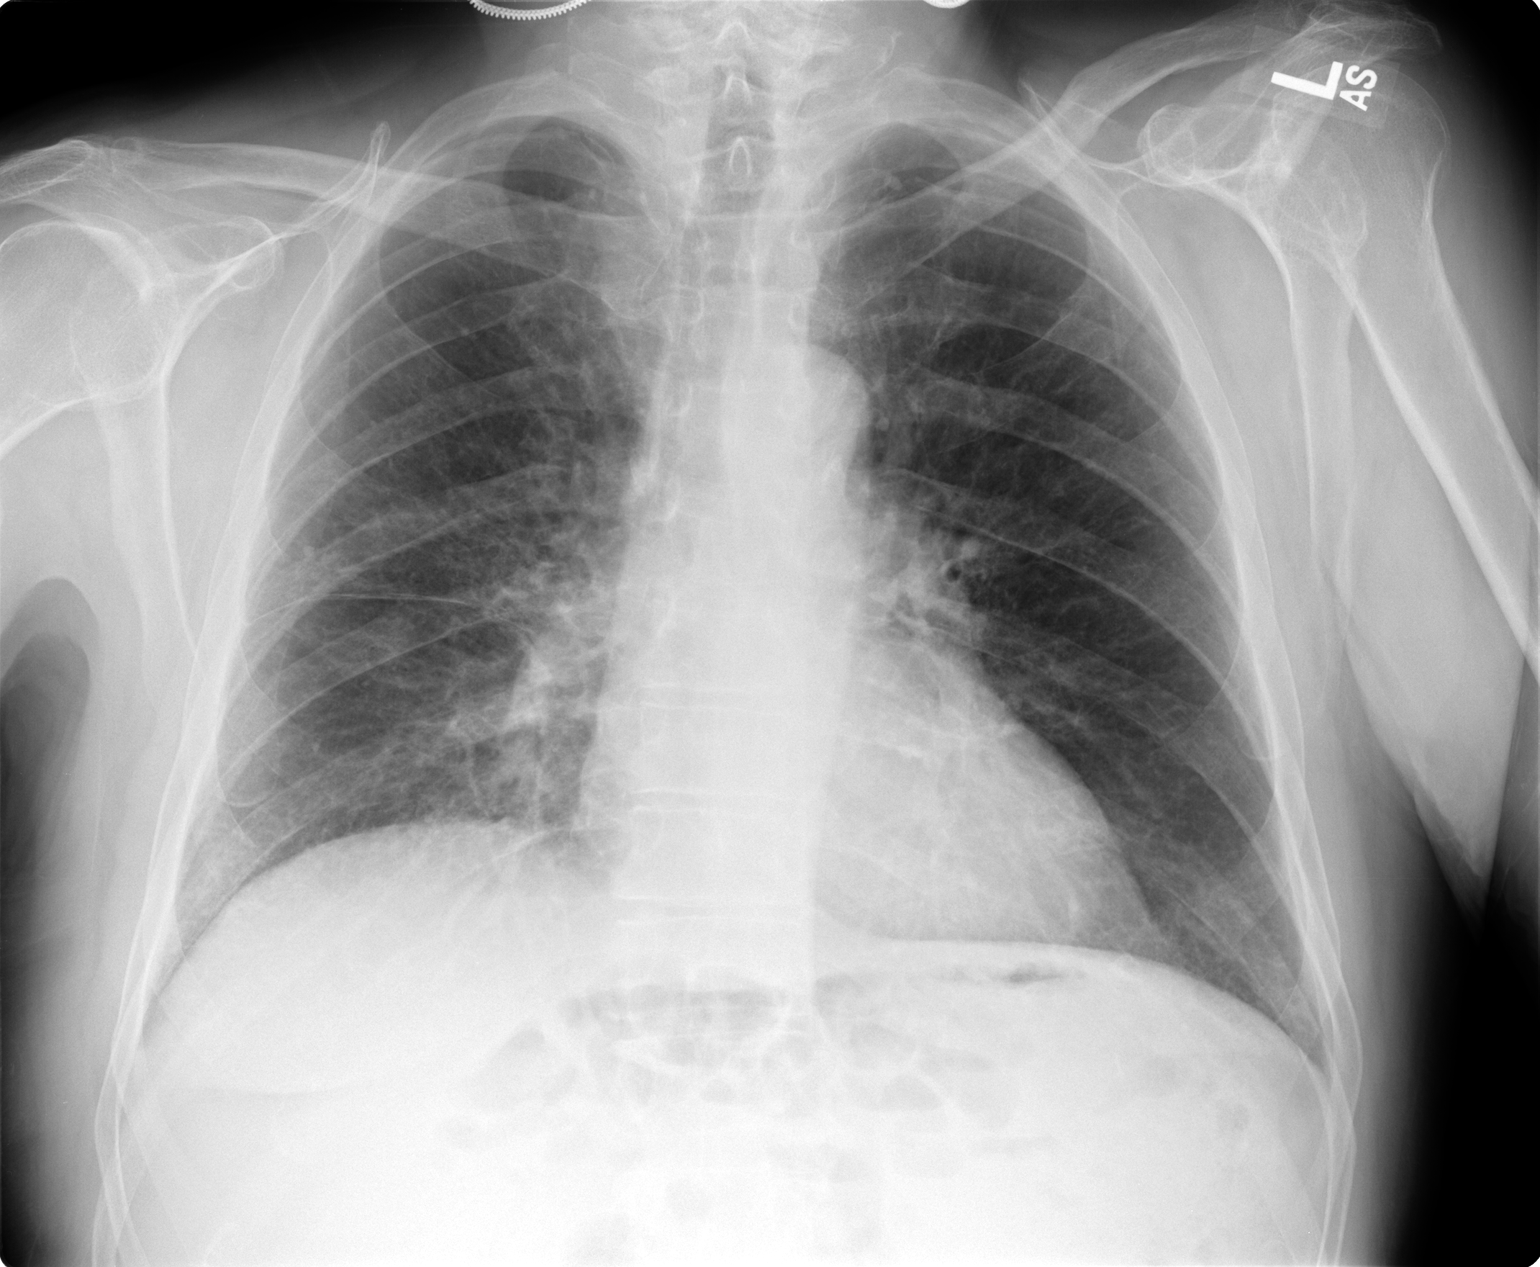

[view not recorded (2 of 2)]
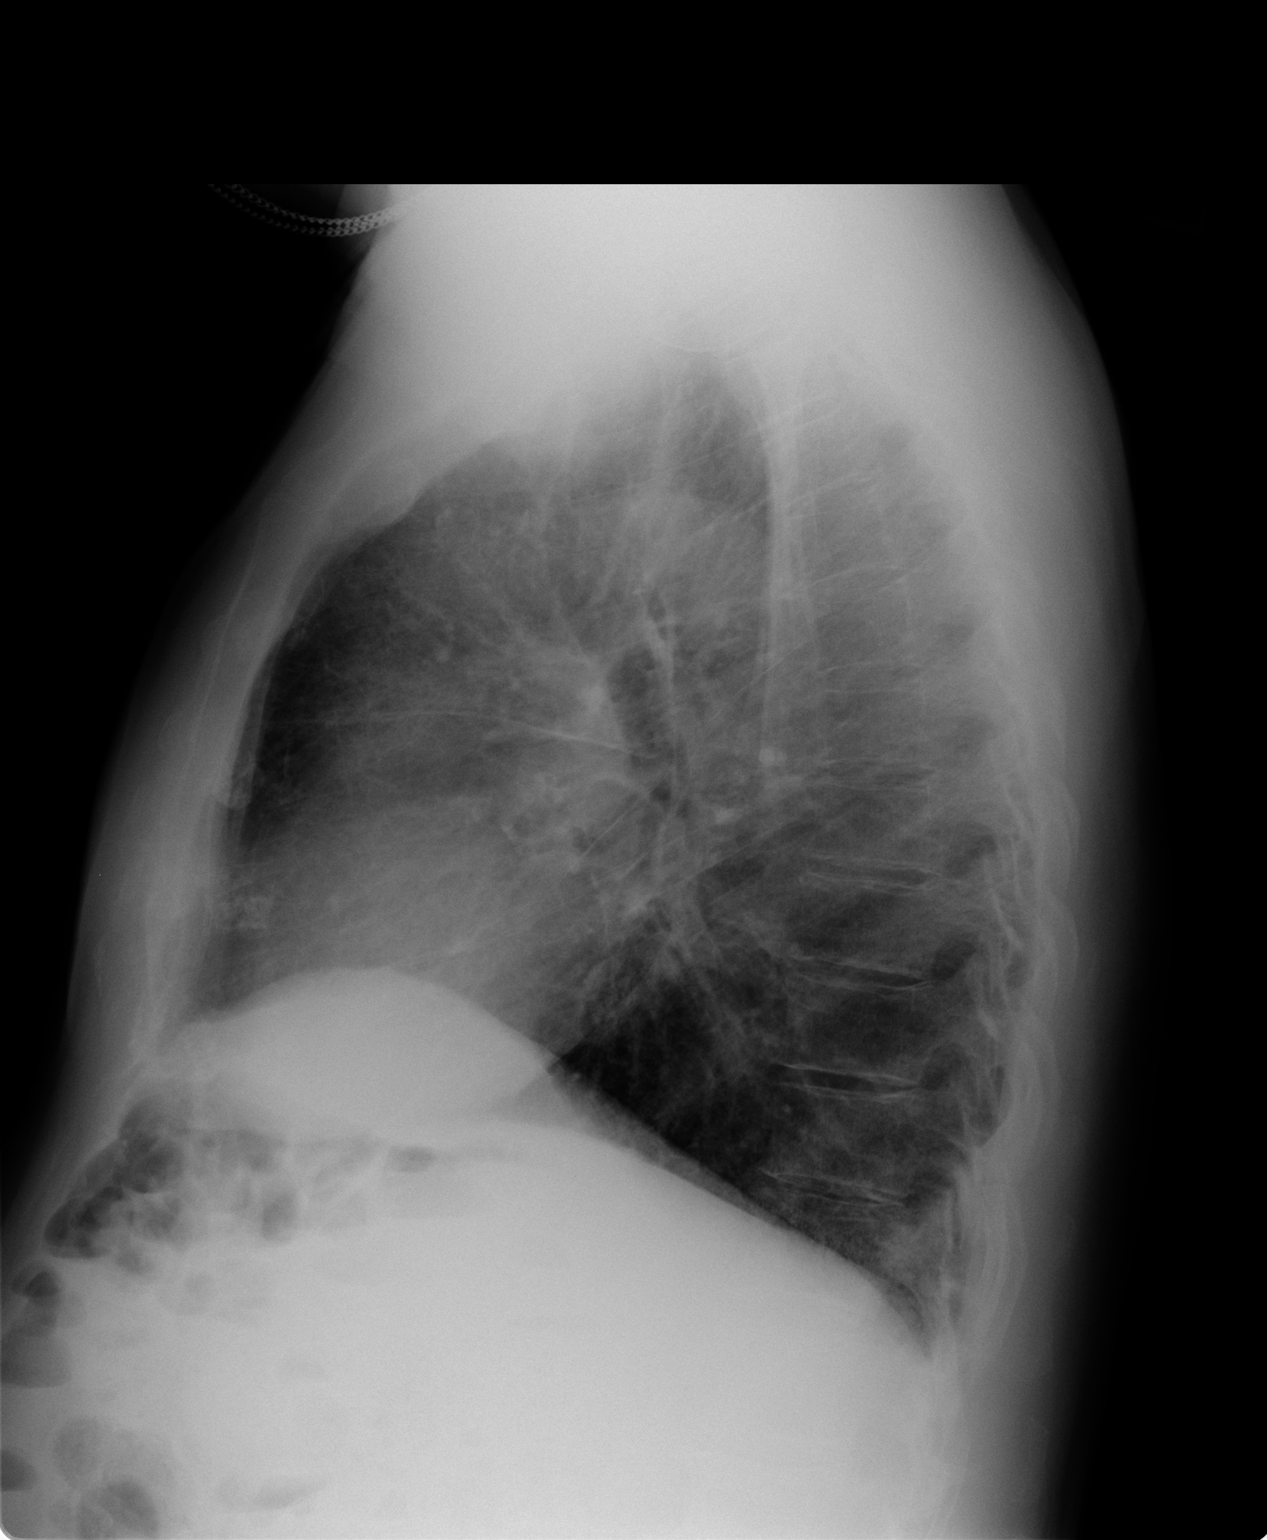

[2 of 2 positions shown; findings below may reference images not displayed]

FINDINGS: Chronic lung disease with bilateral pulmonary scarring. Calcified
granulomas are noted on the right including right posterior lower
lobe overlying the lower lumbar spine which is unchanged from the
CT.

Negative for heart failure.  Negative for infiltrate or effusion.
IMPRESSION: Chronic lung disease with scarring and prior granulomatous
infection. No superimposed acute abnormality.

## 2014-03-21 ENCOUNTER — Other Ambulatory Visit: Payer: Self-pay

## 2014-03-21 MED ORDER — HYDROCODONE-ACETAMINOPHEN 7.5-325 MG PO TABS: 1.0000 | ORAL_TABLET | Freq: Two times a day (BID) | ORAL | Status: DC | PRN

## 2014-04-01 ENCOUNTER — Ambulatory Visit (INDEPENDENT_AMBULATORY_CARE_PROVIDER_SITE_OTHER): Payer: PPO

## 2014-04-01 DIAGNOSIS — D519 Vitamin B12 deficiency anemia, unspecified: Secondary | ICD-10-CM

## 2014-04-01 MED ORDER — CYANOCOBALAMIN 1000 MCG/ML IJ SOLN
1000.0000 ug | Freq: Once | INTRAMUSCULAR | Status: AC
  Administered 2014-04-01: 1000 ug via INTRAMUSCULAR

## 2014-04-01 NOTE — Progress Notes (Signed)
Patient in for monthly b12 injection.  Injection given as ordered.  Patient aware to return in 30 days for next injection

## 2014-04-25 ENCOUNTER — Other Ambulatory Visit: Payer: Self-pay

## 2014-04-25 MED ORDER — HYDROCODONE-ACETAMINOPHEN 7.5-325 MG PO TABS: 1.0000 | ORAL_TABLET | Freq: Two times a day (BID) | ORAL | Status: DC | PRN

## 2014-04-30 ENCOUNTER — Ambulatory Visit (INDEPENDENT_AMBULATORY_CARE_PROVIDER_SITE_OTHER): Payer: PPO

## 2014-04-30 DIAGNOSIS — D519 Vitamin B12 deficiency anemia, unspecified: Secondary | ICD-10-CM

## 2014-04-30 MED ORDER — CYANOCOBALAMIN 1000 MCG/ML IJ SOLN
1000.0000 ug | Freq: Once | INTRAMUSCULAR | Status: AC
  Administered 2014-04-30: 1000 ug via INTRAMUSCULAR

## 2014-05-23 ENCOUNTER — Other Ambulatory Visit: Payer: Self-pay

## 2014-05-23 LAB — COMPLETE METABOLIC PANEL WITH GFR
ALBUMIN: 3.8 g/dL (ref 3.5–5.2)
ALT: 13 U/L (ref 0–53)
AST: 23 U/L (ref 0–37)
Alkaline Phosphatase: 51 U/L (ref 39–117)
BUN: 21 mg/dL (ref 6–23)
CO2: 23 mEq/L (ref 19–32)
Calcium: 9.5 mg/dL (ref 8.4–10.5)
Chloride: 92 mEq/L — ABNORMAL LOW (ref 96–112)
Creat: 1.11 mg/dL (ref 0.50–1.35)
GFR, EST AFRICAN AMERICAN: 76 mL/min
GFR, EST NON AFRICAN AMERICAN: 66 mL/min
GLUCOSE: 208 mg/dL — AB (ref 70–99)
POTASSIUM: 5.4 meq/L — AB (ref 3.5–5.3)
SODIUM: 126 meq/L — AB (ref 135–145)
Total Bilirubin: 0.4 mg/dL (ref 0.2–1.2)
Total Protein: 6.4 g/dL (ref 6.0–8.3)

## 2014-05-23 LAB — CBC
HCT: 39 % (ref 39.0–52.0)
Hemoglobin: 12.6 g/dL — ABNORMAL LOW (ref 13.0–17.0)
MCH: 29.7 pg (ref 26.0–34.0)
MCHC: 32.3 g/dL (ref 30.0–36.0)
MCV: 92 fL (ref 78.0–100.0)
MPV: 9.8 fL (ref 9.4–12.4)
PLATELETS: 255 10*3/uL (ref 150–400)
RBC: 4.24 MIL/uL (ref 4.22–5.81)
RDW: 14 % (ref 11.5–15.5)
WBC: 12.7 10*3/uL — ABNORMAL HIGH (ref 4.0–10.5)

## 2014-05-23 LAB — HEMOGLOBIN A1C
Hgb A1c MFr Bld: 8.6 % — ABNORMAL HIGH (ref <5.7)
Mean Plasma Glucose: 200 mg/dL — ABNORMAL HIGH (ref <117)

## 2014-05-23 MED ORDER — HYDROCODONE-ACETAMINOPHEN 7.5-325 MG PO TABS: 1.0000 | ORAL_TABLET | Freq: Two times a day (BID) | ORAL | Status: DC | PRN

## 2014-05-26 ENCOUNTER — Ambulatory Visit (INDEPENDENT_AMBULATORY_CARE_PROVIDER_SITE_OTHER): Payer: PPO | Admitting: Family Medicine

## 2014-05-26 ENCOUNTER — Encounter: Payer: Self-pay | Admitting: Family Medicine

## 2014-05-26 VITALS — BP 124/68 | HR 83 | Resp 16 | Ht 69.0 in | Wt 164.0 lb

## 2014-05-26 DIAGNOSIS — Z91199 Patient's noncompliance with other medical treatment and regimen due to unspecified reason: Secondary | ICD-10-CM

## 2014-05-26 DIAGNOSIS — J449 Chronic obstructive pulmonary disease, unspecified: Secondary | ICD-10-CM

## 2014-05-26 DIAGNOSIS — F411 Generalized anxiety disorder: Secondary | ICD-10-CM

## 2014-05-26 DIAGNOSIS — Z5329 Procedure and treatment not carried out because of patient's decision for other reasons: Secondary | ICD-10-CM

## 2014-05-26 DIAGNOSIS — D519 Vitamin B12 deficiency anemia, unspecified: Secondary | ICD-10-CM | POA: Diagnosis not present

## 2014-05-26 DIAGNOSIS — K219 Gastro-esophageal reflux disease without esophagitis: Secondary | ICD-10-CM

## 2014-05-26 DIAGNOSIS — F17208 Nicotine dependence, unspecified, with other nicotine-induced disorders: Secondary | ICD-10-CM

## 2014-05-26 DIAGNOSIS — E1165 Type 2 diabetes mellitus with hyperglycemia: Principal | ICD-10-CM

## 2014-05-26 DIAGNOSIS — E785 Hyperlipidemia, unspecified: Secondary | ICD-10-CM | POA: Diagnosis not present

## 2014-05-26 DIAGNOSIS — M5440 Lumbago with sciatica, unspecified side: Secondary | ICD-10-CM

## 2014-05-26 DIAGNOSIS — E871 Hypo-osmolality and hyponatremia: Secondary | ICD-10-CM

## 2014-05-26 DIAGNOSIS — E1065 Type 1 diabetes mellitus with hyperglycemia: Secondary | ICD-10-CM | POA: Diagnosis not present

## 2014-05-26 DIAGNOSIS — I739 Peripheral vascular disease, unspecified: Secondary | ICD-10-CM

## 2014-05-26 DIAGNOSIS — IMO0001 Reserved for inherently not codable concepts without codable children: Secondary | ICD-10-CM

## 2014-05-26 DIAGNOSIS — I150 Renovascular hypertension: Secondary | ICD-10-CM | POA: Diagnosis not present

## 2014-05-26 DIAGNOSIS — F419 Anxiety disorder, unspecified: Secondary | ICD-10-CM

## 2014-05-26 DIAGNOSIS — F5105 Insomnia due to other mental disorder: Secondary | ICD-10-CM

## 2014-05-26 DIAGNOSIS — Z794 Long term (current) use of insulin: Principal | ICD-10-CM

## 2014-05-26 MED ORDER — INSULIN GLARGINE 100 UNIT/ML SOLOSTAR PEN: PEN_INJECTOR | SUBCUTANEOUS | Status: DC

## 2014-05-26 MED ORDER — CYANOCOBALAMIN 1000 MCG/ML IJ SOLN
1000.0000 ug | Freq: Once | INTRAMUSCULAR | Status: AC
  Administered 2014-05-26: 1000 ug via INTRAMUSCULAR

## 2014-05-26 NOTE — Patient Instructions (Addendum)
Annual wellness in 3 month, ca;ll if you need me before  We will refer you to eye specialist who takes your ins  New additional insulin lantus start at 7 units  Every morning , then you will call in every 1 week to nurse tio increase the diose of lantus if needed  (Currently you take 35 units novolog in am and 20 in pm)  No change in novolog, januvia or metformin   Increase salt intake , rept non fast chem 7 in 2 weeks  Fasting lipid, cmp and eGFr, hBA1C in 3 month  Pls work on cutting back cigarettes, now on 18  Reconsiderstool exam/ colonoscopy

## 2014-06-10 ENCOUNTER — Encounter: Payer: Self-pay | Admitting: *Deleted

## 2014-06-11 ENCOUNTER — Other Ambulatory Visit: Payer: Self-pay | Admitting: Family Medicine

## 2014-06-12 ENCOUNTER — Other Ambulatory Visit: Payer: Self-pay | Admitting: Family Medicine

## 2014-06-17 LAB — BASIC METABOLIC PANEL
BUN: 21 mg/dL (ref 6–23)
CHLORIDE: 95 meq/L — AB (ref 96–112)
CO2: 24 meq/L (ref 19–32)
Calcium: 9 mg/dL (ref 8.4–10.5)
Creat: 1.36 mg/dL — ABNORMAL HIGH (ref 0.50–1.35)
GLUCOSE: 184 mg/dL — AB (ref 70–99)
Potassium: 5.2 mEq/L (ref 3.5–5.3)
Sodium: 129 mEq/L — ABNORMAL LOW (ref 135–145)

## 2014-06-19 ENCOUNTER — Other Ambulatory Visit: Payer: Self-pay

## 2014-06-19 MED ORDER — HYDROCODONE-ACETAMINOPHEN 7.5-325 MG PO TABS: 1.0000 | ORAL_TABLET | Freq: Two times a day (BID) | ORAL | Status: DC | PRN

## 2014-06-30 ENCOUNTER — Ambulatory Visit (INDEPENDENT_AMBULATORY_CARE_PROVIDER_SITE_OTHER): Payer: PPO

## 2014-06-30 ENCOUNTER — Other Ambulatory Visit: Payer: Self-pay | Admitting: Family Medicine

## 2014-06-30 VITALS — Wt 164.0 lb

## 2014-06-30 DIAGNOSIS — D519 Vitamin B12 deficiency anemia, unspecified: Secondary | ICD-10-CM

## 2014-06-30 MED ORDER — CYANOCOBALAMIN 1000 MCG/ML IJ SOLN
1000.0000 ug | Freq: Once | INTRAMUSCULAR | Status: AC
  Administered 2014-06-30: 1000 ug via INTRAMUSCULAR

## 2014-06-30 NOTE — Progress Notes (Signed)
b12 injection given with no complications. Pt to return in 30 days for next injection

## 2014-07-16 ENCOUNTER — Other Ambulatory Visit: Payer: Self-pay

## 2014-07-16 MED ORDER — HYDROCODONE-ACETAMINOPHEN 7.5-325 MG PO TABS: 1.0000 | ORAL_TABLET | Freq: Two times a day (BID) | ORAL | Status: DC | PRN

## 2014-07-17 ENCOUNTER — Telehealth: Payer: Self-pay | Admitting: Family Medicine

## 2014-07-17 NOTE — Assessment & Plan Note (Signed)
Unchanged, smoking 18 ciggs per day, no plan tpo quit currently Patient counseled for approximately 5 minutes regarding the health risks of ongoing nicotine use, specifically all types of cancer, heart disease, stroke and respiratory failure. The options available for help with cessation ,the behavioral changes to assist the process, and the option to either gradully reduce usage  Or abruptly stop.is also discussed. Pt is also encouraged to set specific goals in number of cigarettes used daily, as well as to set a quit date.  Number of cigarettes/cigars currently smoking daily: 18

## 2014-07-17 NOTE — Assessment & Plan Note (Signed)
Recurrent problem, water restriction and increased sodium intake discussed, rept lab in 2 weeks, the thiazide diuretic aggravates this , may reconsider changing to spironolactoine

## 2014-07-17 NOTE — Telephone Encounter (Signed)
Pls contact pt/ "my eye Doc" I think he did not see the ophthalmologist, in 05/2014 visit seems as tho there is a question of Doc who will see him If he is covered by Dr Nile RiggsShapiro or Bing PlumeHaynes better for him to see ophthalmologist , spk wit him and try and sort out situation pls, if need  To, pls enter referal , i will sign, if unable to sort out will need to wait till his f/u as he was given his Dec appt and did not keep it

## 2014-07-17 NOTE — Assessment & Plan Note (Signed)
Worsening due to ongoing nicotine use , continue current meds, pt also encouraged to stip smoking

## 2014-07-17 NOTE — Assessment & Plan Note (Signed)
Refuses colonoscopy  Or stool testing des[pite positive f/h of colon cancer, he ahs never had testing done , and intentionally so

## 2014-07-17 NOTE — Progress Notes (Signed)
Subjective:    Patient ID: Donald Everett, male    DOB: 17-Jul-1940, 74 y.o.   MRN: [id number]  HPI   Donald Everett     MRN: [id number]      DOB: 07/26/1940   HPI Mr. Hochstatter is here for follow up and re-evaluation of chronic medical conditions, medication management and review of any available recent lab and radiology data.  Preventive health is updated, specifically  Cancer screening and Immunization.   Questions or concerns regarding consultations or procedures which the PT has had in the interim are  addressed. The PT denies any adverse reactions to current medications since the last visit.  There are no new concerns.  There are no specific complaints  Denies polyuria, polydipsia, blurred vision , or hypoglycemic episodes.   ROS Denies recent fever or chills. Denies sinus pressure, nasal congestion, ear pain or sore throat. Denies chest congestion, productive cough or wheezing. Denies chest pains, palpitations and leg swelling Denies abdominal pain, nausea, vomiting,diarrhea or constipation.   Denies dysuria, frequency, hesitancy or incontinence. Chronic back pain and limitation in mobility. Denies headaches, seizures, numbness, or tingling. Denies uncontrolled depression, anxiety or insomnia. Denies skin break down or rash.   PE  BP 124/68 mmHg  Pulse 83  Resp 16  Ht  (1.753 m)  Wt 164 lb (74.39 kg)  BMI 24.21 kg/m2  SpO2 99%  Patient alert and oriented and in no cardiopulmonary distress.  HEENT: No facial asymmetry, EOMI,   oropharynx pink and moist.  Neck supple no JVD, no mass.  Chest: Clear to auscultation bilaterally.Decreased air entry bilterally  CVS: S1, S2 no murmurs, no S3.Regular rate.  ABD: Soft non tender.   Ext: No edema  MS: Adequate ROM spine, shoulders, hips and knees.  Skin: Intact, no ulcerations or rash noted.  Psych: Good eye contact, normal affect. Memory intact not anxious or depressed appearing.  CNS: CN 2-12 intact,  power,  normal throughout.no focal deficits noted.   Assessment & Plan   Renovascular hypertension Controlled, no change in medication DASH diet and commitment to daily physical activity for a minimum of 30 minutes discussed and encouraged, as a part of hypertension management. The importance of attaining a healthy weight is also discussed.  BP/Weight 06/30/2014 05/26/2014 02/24/2014 01/22/2014 12/26/2013 11/28/2013 10/23/2013  Systolic BP - 124 - 140 136 140 150  Diastolic BP - 68 - 70 80 76 74  Wt. (Lbs) 164 164 166 166.04 - 164 162  BMI 24.21 24.21 24.5 24.51 - 24.21 23.91         Nicotine dependence Unchanged, smoking 18 ciggs per day, no plan tpo quit currently Patient counseled for approximately 5 minutes regarding the health risks of ongoing nicotine use, specifically all types of cancer, heart disease, stroke and respiratory failure. The options available for help with cessation ,the behavioral changes to assist the process, and the option to either gradully reduce usage  Or abruptly stop.is also discussed. Pt is also encouraged to set specific goals in number of cigarettes used daily, as well as to set a quit date.  Number of cigarettes/cigars currently smoking daily: 18    Insomnia secondary to anxiety Sleep hygiene reviewed and written information offered also. Prescription sent for  medication needed.    LOW BACK PAIN, CHRONIC Unchanged, chronic pain management as before   GENERALIZED ANXIETY DISORDER Controlled, no change in medication Behavioral techniques to deal with anxiety discussed also   Hyperlipidemia LDL goal <100 Hyperlipidemia:Low fat  diet discussed and encouraged. Needs to increase physical activity due to low hDL  Lipid Panel  Lab Results  Component Value Date   CHOL 121 01/17/2014   HDL 31* 01/17/2014   LDLCALC 65 01/17/2014   TRIG 127 01/17/2014   CHOLHDL 3.9 01/17/2014   Updated lab needed at/ before next  visit.        Diabetes mellitus, insulin dependent (IDDM), uncontrolled Deteriorated. Increase med dose and educated and encouraged once more to call office with concerns re uncontrolled blood sugar Patient educated about the importance of limiting  Carbohydrate intake , the need to commit to daily physical activity for a minimum of 30 minutes , and to commit weight loss. The fact that changes in all these areas will improve management of diabetes  Diabetic Labs Latest Ref Rng 06/16/2014 05/22/2014 01/17/2014 10/22/2013 06/12/2013  HbA1c <5.7 % - 8.6(H) 8.3(H) 8.6(H) 8.0(H)  Microalbumin <2.0 mg/dL - - 1.7 - -  Micro/Creat Ratio 0.0 - 30.0 mg/g - - 31.6(H) - -  Chol 0 - 200 mg/dL - - 161121 - 096115  HDL >04>39 mg/dL - - 54(U31(L) - 98(J28(L)  Calc LDL 0 - 99 mg/dL - - 65 - 64  Triglycerides <150 mg/dL - - 191127 - 478113  Creatinine 0.50 - 1.35 mg/dL 2.95(A1.36(H) 2.131.11 0.861.15 5.78(I1.50(H) 1.37(H)   BP/Weight 06/30/2014 05/26/2014 02/24/2014 01/22/2014 12/26/2013 11/28/2013 10/23/2013  Systolic BP - 124 - 140 136 140 150  Diastolic BP - 68 - 70 80 76 74  Wt. (Lbs) 164 164 166 166.04 - 164 162  BMI 24.21 24.21 24.5 24.51 - 24.21 23.91   Foot/eye exam completion dates 01/22/2014 01/24/2013  Foot Form Completion Done Done        GERD (gastroesophageal reflux disease) Controlled, no change in medication    COPD (chronic obstructive pulmonary disease) Worsening due to ongoing nicotine use , continue current meds, pt also encouraged to stip smoking   PAD (peripheral artery disease) Followed by vascular surgery, encourage to stop smoking , symptomatic with claudication   Patient non-compliant, refused service Refuses colonoscopy  Or stool testing des[pite positive f/h of colon cancer, he ahs never had testing done , and intentionally so   Hyponatremia Recurrent problem, water restriction and increased sodium intake discussed, rept lab in 2 weeks, the thiazide diuretic aggravates this , may reconsider changing to  spironolactoine        Review of Systems     Objective:   Physical Exam        Assessment & Plan:

## 2014-07-17 NOTE — Assessment & Plan Note (Signed)
Controlled, no change in medication Behavioral techniques to deal with anxiety discussed also

## 2014-07-17 NOTE — Assessment & Plan Note (Signed)
Controlled, no change in medication DASH diet and commitment to daily physical activity for a minimum of 30 minutes discussed and encouraged, as a part of hypertension management. The importance of attaining a healthy weight is also discussed.  BP/Weight 06/30/2014 05/26/2014 02/24/2014 01/22/2014 12/26/2013 11/28/2013 10/23/2013  Systolic BP - 124 - 140 136 140 150  Diastolic BP - 68 - 70 80 76 74  Wt. (Lbs) 164 164 166 166.04 - 164 162  BMI 24.21 24.21 24.5 24.51 - 24.21 23.91

## 2014-07-17 NOTE — Assessment & Plan Note (Signed)
Unchanged, chronic pain management as before  

## 2014-07-17 NOTE — Assessment & Plan Note (Signed)
Controlled, no change in medication  

## 2014-07-17 NOTE — Assessment & Plan Note (Addendum)
Followed by vascular surgery, encourage to stop smoking , symptomatic with claudication

## 2014-07-17 NOTE — Assessment & Plan Note (Signed)
Sleep hygiene reviewed and written information offered also. Prescription sent for  medication needed.  

## 2014-07-17 NOTE — Assessment & Plan Note (Signed)
Deteriorated. Increase med dose and educated and encouraged once more to call office with concerns re uncontrolled blood sugar Patient educated about the importance of limiting  Carbohydrate intake , the need to commit to daily physical activity for a minimum of 30 minutes , and to commit weight loss. The fact that changes in all these areas will improve management of diabetes  Diabetic Labs Latest Ref Rng 06/16/2014 05/22/2014 01/17/2014 10/22/2013 06/12/2013  HbA1c <5.7 % - 8.6(H) 8.3(H) 8.6(H) 8.0(H)  Microalbumin <2.0 mg/dL - - 1.7 - -  Micro/Creat Ratio 0.0 - 30.0 mg/g - - 31.6(H) - -  Chol 0 - 200 mg/dL - - 161121 - 096115  HDL >04>39 mg/dL - - 54(U31(L) - 98(J28(L)  Calc LDL 0 - 99 mg/dL - - 65 - 64  Triglycerides <150 mg/dL - - 191127 - 478113  Creatinine 0.50 - 1.35 mg/dL 2.95(A1.36(H) 2.131.11 0.861.15 5.78(I1.50(H) 1.37(H)   BP/Weight 06/30/2014 05/26/2014 02/24/2014 01/22/2014 12/26/2013 11/28/2013 10/23/2013  Systolic BP - 124 - 140 136 140 150  Diastolic BP - 68 - 70 80 76 74  Wt. (Lbs) 164 164 166 166.04 - 164 162  BMI 24.21 24.21 24.5 24.51 - 24.21 23.91   Foot/eye exam completion dates 01/22/2014 01/24/2013  Foot Form Completion Done Done

## 2014-07-17 NOTE — Assessment & Plan Note (Signed)
Hyperlipidemia:Low fat diet discussed and encouraged. Needs to increase physical activity due to low hDL  Lipid Panel  Lab Results  Component Value Date   CHOL 121 01/17/2014   HDL 31* 01/17/2014   LDLCALC 65 01/17/2014   TRIG 127 01/17/2014   CHOLHDL 3.9 01/17/2014   Updated lab needed at/ before next visit.

## 2014-07-18 NOTE — Telephone Encounter (Signed)
States he has not been yet but is planning on calling next week to make an appointment at my eye dr. He will have to pay out of pocket but he [place]

## 2014-07-31 ENCOUNTER — Ambulatory Visit (INDEPENDENT_AMBULATORY_CARE_PROVIDER_SITE_OTHER): Payer: PPO

## 2014-07-31 ENCOUNTER — Other Ambulatory Visit: Payer: Self-pay

## 2014-07-31 DIAGNOSIS — D519 Vitamin B12 deficiency anemia, unspecified: Secondary | ICD-10-CM | POA: Diagnosis not present

## 2014-07-31 MED ORDER — AMLODIPINE BESYLATE 10 MG PO TABS: 10.0000 mg | ORAL_TABLET | Freq: Every day | ORAL | Status: DC

## 2014-07-31 MED ORDER — CYANOCOBALAMIN 1000 MCG/ML IJ SOLN
1000.0000 ug | Freq: Once | INTRAMUSCULAR | Status: AC
  Administered 2014-07-31: 1000 ug via INTRAMUSCULAR

## 2014-07-31 NOTE — Progress Notes (Signed)
Patient in for nurse visit.  B12 injection given IM.  No voiced complaints.  Patient to return as scheduled.

## 2014-08-22 ENCOUNTER — Other Ambulatory Visit: Payer: Self-pay

## 2014-08-22 MED ORDER — HYDROCODONE-ACETAMINOPHEN 7.5-325 MG PO TABS: 1.0000 | ORAL_TABLET | Freq: Two times a day (BID) | ORAL | Status: DC | PRN

## 2014-08-27 ENCOUNTER — Encounter: Payer: PPO | Admitting: Family Medicine

## 2014-08-27 LAB — COMPLETE METABOLIC PANEL WITH GFR
ALK PHOS: 40 U/L (ref 39–117)
ALT: 10 U/L (ref 0–53)
AST: 19 U/L (ref 0–37)
Albumin: 3.8 g/dL (ref 3.5–5.2)
BILIRUBIN TOTAL: 0.4 mg/dL (ref 0.2–1.2)
BUN: 22 mg/dL (ref 6–23)
CALCIUM: 9.3 mg/dL (ref 8.4–10.5)
CO2: 24 mEq/L (ref 19–32)
CREATININE: 1.29 mg/dL (ref 0.50–1.35)
Chloride: 95 mEq/L — ABNORMAL LOW (ref 96–112)
GFR, EST AFRICAN AMERICAN: 63 mL/min
GFR, Est Non African American: 55 mL/min — ABNORMAL LOW
GLUCOSE: 151 mg/dL — AB (ref 70–99)
POTASSIUM: 5.3 meq/L (ref 3.5–5.3)
Sodium: 126 mEq/L — ABNORMAL LOW (ref 135–145)
Total Protein: 6.5 g/dL (ref 6.0–8.3)

## 2014-08-27 LAB — LIPID PANEL
Cholesterol: 111 mg/dL (ref 0–200)
HDL: 26 mg/dL — AB (ref >=40)
LDL Cholesterol: 58 mg/dL (ref 0–99)
TRIGLYCERIDES: 133 mg/dL (ref <150)
Total CHOL/HDL Ratio: 4.3 Ratio
VLDL: 27 mg/dL (ref 0–40)

## 2014-08-27 LAB — HEMOGLOBIN A1C
HEMOGLOBIN A1C: 7.8 % — AB (ref <5.7)
Mean Plasma Glucose: 177 mg/dL — ABNORMAL HIGH (ref <117)

## 2014-09-01 ENCOUNTER — Other Ambulatory Visit: Payer: Self-pay

## 2014-09-01 DIAGNOSIS — IMO0001 Reserved for inherently not codable concepts without codable children: Secondary | ICD-10-CM

## 2014-09-01 DIAGNOSIS — E1165 Type 2 diabetes mellitus with hyperglycemia: Principal | ICD-10-CM

## 2014-09-01 DIAGNOSIS — Z794 Long term (current) use of insulin: Principal | ICD-10-CM

## 2014-09-01 MED ORDER — GABAPENTIN 300 MG PO CAPS: ORAL_CAPSULE | ORAL | Status: DC

## 2014-09-01 MED ORDER — AMLODIPINE BESYLATE 10 MG PO TABS: 10.0000 mg | ORAL_TABLET | Freq: Every day | ORAL | Status: DC

## 2014-09-17 ENCOUNTER — Other Ambulatory Visit: Payer: Self-pay | Admitting: Family Medicine

## 2014-09-29 ENCOUNTER — Other Ambulatory Visit: Payer: Self-pay

## 2014-09-29 ENCOUNTER — Ambulatory Visit (INDEPENDENT_AMBULATORY_CARE_PROVIDER_SITE_OTHER): Payer: PPO

## 2014-09-29 ENCOUNTER — Other Ambulatory Visit: Payer: Self-pay | Admitting: Family Medicine

## 2014-09-29 VITALS — Wt 163.0 lb

## 2014-09-29 DIAGNOSIS — D519 Vitamin B12 deficiency anemia, unspecified: Secondary | ICD-10-CM | POA: Diagnosis not present

## 2014-09-29 MED ORDER — CYANOCOBALAMIN 1000 MCG/ML IJ SOLN
1000.0000 ug | Freq: Once | INTRAMUSCULAR | Status: AC
  Administered 2014-09-29: 1000 ug via INTRAMUSCULAR

## 2014-09-29 MED ORDER — HYDROCODONE-ACETAMINOPHEN 7.5-325 MG PO TABS: 1.0000 | ORAL_TABLET | Freq: Two times a day (BID) | ORAL | Status: DC | PRN

## 2014-09-30 ENCOUNTER — Other Ambulatory Visit: Payer: Self-pay

## 2014-09-30 MED ORDER — ALCOHOL SWABS PADS: MEDICATED_PAD | Status: DC

## 2014-10-13 ENCOUNTER — Other Ambulatory Visit: Payer: Self-pay | Admitting: Family Medicine

## 2014-10-24 ENCOUNTER — Other Ambulatory Visit: Payer: Self-pay

## 2014-10-24 MED ORDER — HYDROCODONE-ACETAMINOPHEN 7.5-325 MG PO TABS: 1.0000 | ORAL_TABLET | Freq: Two times a day (BID) | ORAL | Status: DC | PRN

## 2014-11-18 ENCOUNTER — Ambulatory Visit (INDEPENDENT_AMBULATORY_CARE_PROVIDER_SITE_OTHER): Payer: PPO

## 2014-11-18 VITALS — Wt 165.0 lb

## 2014-11-18 DIAGNOSIS — D519 Vitamin B12 deficiency anemia, unspecified: Secondary | ICD-10-CM | POA: Diagnosis not present

## 2014-11-18 MED ORDER — CYANOCOBALAMIN 1000 MCG/ML IJ SOLN
1000.0000 ug | Freq: Once | INTRAMUSCULAR | Status: AC
  Administered 2014-11-18: 1000 ug via INTRAMUSCULAR

## 2014-11-18 NOTE — Progress Notes (Signed)
Injection given with no complications 

## 2014-12-02 ENCOUNTER — Other Ambulatory Visit: Payer: Self-pay | Admitting: Family Medicine

## 2014-12-09 LAB — BASIC METABOLIC PANEL WITH GFR
BUN: 23 mg/dL (ref 7–25)
CALCIUM: 9.1 mg/dL (ref 8.6–10.3)
CO2: 24 mmol/L (ref 20–31)
Chloride: 95 mmol/L — ABNORMAL LOW (ref 98–110)
Creat: 1.33 mg/dL — ABNORMAL HIGH (ref 0.70–1.18)
GFR, EST AFRICAN AMERICAN: 60 mL/min (ref >=60)
GFR, EST NON AFRICAN AMERICAN: 52 mL/min — AB (ref >=60)
GLUCOSE: 118 mg/dL — AB (ref 65–99)
Potassium: 5.4 mmol/L — ABNORMAL HIGH (ref 3.5–5.3)
SODIUM: 127 mmol/L — AB (ref 135–146)

## 2014-12-09 LAB — HEMOGLOBIN A1C
Hgb A1c MFr Bld: 7.8 % — ABNORMAL HIGH (ref <5.7)
MEAN PLASMA GLUCOSE: 177 mg/dL — AB (ref <117)

## 2014-12-12 ENCOUNTER — Other Ambulatory Visit: Payer: Self-pay

## 2014-12-12 MED ORDER — HYDROCODONE-ACETAMINOPHEN 7.5-325 MG PO TABS: 1.0000 | ORAL_TABLET | Freq: Two times a day (BID) | ORAL | Status: DC | PRN

## 2014-12-16 ENCOUNTER — Encounter: Payer: Self-pay | Admitting: Family Medicine

## 2014-12-16 ENCOUNTER — Ambulatory Visit (INDEPENDENT_AMBULATORY_CARE_PROVIDER_SITE_OTHER): Payer: PPO | Admitting: Family Medicine

## 2014-12-16 VITALS — BP 138/62 | HR 94 | Resp 16 | Ht 68.0 in | Wt 162.0 lb

## 2014-12-16 DIAGNOSIS — IMO0002 Reserved for concepts with insufficient information to code with codable children: Secondary | ICD-10-CM

## 2014-12-16 DIAGNOSIS — Z23 Encounter for immunization: Secondary | ICD-10-CM

## 2014-12-16 DIAGNOSIS — Z5329 Procedure and treatment not carried out because of patient's decision for other reasons: Secondary | ICD-10-CM

## 2014-12-16 DIAGNOSIS — Z0000 Encounter for general adult medical examination without abnormal findings: Secondary | ICD-10-CM

## 2014-12-16 DIAGNOSIS — Z125 Encounter for screening for malignant neoplasm of prostate: Secondary | ICD-10-CM

## 2014-12-16 DIAGNOSIS — E1165 Type 2 diabetes mellitus with hyperglycemia: Secondary | ICD-10-CM

## 2014-12-16 DIAGNOSIS — F17200 Nicotine dependence, unspecified, uncomplicated: Secondary | ICD-10-CM

## 2014-12-16 DIAGNOSIS — Z91199 Patient's noncompliance with other medical treatment and regimen due to unspecified reason: Secondary | ICD-10-CM

## 2014-12-16 DIAGNOSIS — D519 Vitamin B12 deficiency anemia, unspecified: Secondary | ICD-10-CM | POA: Diagnosis not present

## 2014-12-16 DIAGNOSIS — M5440 Lumbago with sciatica, unspecified side: Secondary | ICD-10-CM

## 2014-12-16 DIAGNOSIS — N521 Erectile dysfunction due to diseases classified elsewhere: Secondary | ICD-10-CM

## 2014-12-16 DIAGNOSIS — E1159 Type 2 diabetes mellitus with other circulatory complications: Secondary | ICD-10-CM

## 2014-12-16 DIAGNOSIS — E1140 Type 2 diabetes mellitus with diabetic neuropathy, unspecified: Secondary | ICD-10-CM

## 2014-12-16 MED ORDER — METHYLPREDNISOLONE ACETATE 80 MG/ML IJ SUSP
80.0000 mg | Freq: Once | INTRAMUSCULAR | Status: AC
  Administered 2014-12-16: 80 mg via INTRAMUSCULAR

## 2014-12-16 MED ORDER — KETOROLAC TROMETHAMINE 60 MG/2ML IM SOLN
60.0000 mg | Freq: Once | INTRAMUSCULAR | Status: AC
  Administered 2014-12-16: 60 mg via INTRAMUSCULAR

## 2014-12-16 MED ORDER — CYANOCOBALAMIN 1000 MCG/ML IJ SOLN
1000.0000 ug | Freq: Once | INTRAMUSCULAR | Status: AC
  Administered 2014-12-16: 1000 ug via INTRAMUSCULAR

## 2014-12-16 NOTE — Patient Instructions (Addendum)
F/u in 3.5 month, call if you need me sooner  Flu vaccine today  TdAP today due to laceration on left hand where you injured it yesterday  Pls reduce cigarettes by 2 each month, current is 1 PPD  [facility] will be [facility] [place] for care in Jan 2017  CONGRATS on excellent blood sugar control, keep it  Up  Toradol 60 mg IM and depo medrol 80 mg IM today in office for acute back pain from injury yesterday  B12 injection today  PLS call and let us know who to refer  You to for eye exam which is needed and is past due and also to podiatrist for foot care , since sensation and circulation are extremely poor in both feet  Fasting labs for your 3.5 month follow up pls   Pls return 3 stool cards for testing for colon cancer while you still decide on colonoscopy

## 2014-12-16 NOTE — Progress Notes (Signed)
Subjective:    Patient ID: Donald Everett, male    DOB: [birth year], 74 y.o.   MRN: [id number]  HPI  Preventive Screening-Counseling & Management   Patient present here today for a Medicare annual wellness visit. C/o acute back pain since past weekend when he injured the back at home. C/o abrasion to right thumb during injury, no purulent drainage or redness Denies new lower extremity weakness or numbness, ,denies incontinence of stool or urine.   Current Problems (verified)   Medications Prior to Visit Allergies (verified)   PAST HISTORY  Family History Verified   Social History Divorced, lives alone. Father to 3 children. Current smoker 1 PPD    Risk Factors  Current exercise habits:  Tries to exercise a few days a week, walking for 15-20 minutes   Dietary issues discussed: heart healthy, low fat low carb    Cardiac risk factors: Type 2 DM, nicotine use   Depression Screen  (Note: if answer to either of the following is "Yes", a more complete depression screening is indicated)   Over the past two weeks, have you felt down, depressed or hopeless? No  Over the past two weeks, have you felt little interest or pleasure in doing things? No  Have you lost interest or pleasure in daily life? No  Do you often feel hopeless? No  Do you cry easily over simple problems? No   Activities of Daily Living  In your present state of health, do you have any difficulty performing the following activities?  Driving?: No Managing money?: No Feeding yourself?:No Getting from bed to chair?:No Climbing a flight of stairs?: has to hold on  Preparing food and eating?:No Bathing or showering?:No Getting dressed?:No Getting to the toilet?:No Using the toilet?:No Moving around from place to place?: No  Fall Risk Assessment In the past year have you fallen or had a near fall?:No Are you currently taking any medications that make you dizzy:No   Hearing Difficulties: No Do you often  ask people to speak up or repeat themselves?:sometimes  Do you experience ringing or noises in your ears?:No Do you have difficulty understanding soft or whispered voices?:No  Cognitive Testing  Alert? Yes Normal Appearance?Yes  Oriented to person? Yes Place? Yes  Time? Yes  Displays appropriate judgment?Yes  Can read the correct time from a watch face? yes Are you having problems remembering things?No  Advanced Directives have been discussed with the patient?Yes, brochure given, full code at this time     List the Names of Other Physician/Practitioners you currently use:    Indicate any recent Medical Services you may have received from other than [facility] providers in the past year (date may be approximate).   Assessment:    Annual Wellness Exam   Plan:    Medicare Attestation  I have personally reviewed:  The patient's medical and social history  Their use of alcohol, tobacco or illicit drugs  Their current medications and supplements  The patient's functional ability including ADLs,fall risks, home safety risks, cognitive, and hearing and visual impairment  Diet and physical activities  Evidence for depression or mood disorders  The patient's weight, height, BMI, and visual acuity have been recorded in the chart. I have made referrals, counseling, and provided education to the patient based on review of the above and I have provided the patient with a written personalized care plan for preventive services.     Review of Systems     Objective:   Physical Exam  BP 138/62 mmHg  Pulse 94  Resp 16  Ht 5\' 8"  (1.727 m)  Wt 162 lb (73.483 kg)  BMI 24.64 kg/m2  SpO2 99%   MS: decreased ROM lumbar spine with paraspinal spasm and tenderness over L4 and L5 Normal power, tone , sensation and reflexes in lower extremities.  Skin: abrasion to right thumb, no erythema or puulent drainage     Assessment & Plan:  Medicare annual wellness visit, subsequent Annual exam as  documented. Counseling done  re healthy lifestyle involving commitment to 150 minutes exercise per week, heart healthy diet, and attaining healthy weight.The importance of adequate sleep also discussed. Regular seat belt use and home safety, is also discussed. Changes in health habits are decided on by the patient with goals and time frames  set for achieving them. Immunization and cancer screening needs are specifically addressed at this visit.   NICOTINE ADDICTION Patient counseled for approximately 5 minutes regarding the health risks of ongoing nicotine use, specifically all types of cancer, heart disease, stroke and respiratory failure. The options available for help with cessation ,the behavioral changes to assist the process, and the option to either gradully reduce usage  Or abruptly stop.is also discussed. Pt is also encouraged to set specific goals in number of cigarettes used daily, as well as to set a quit date.  Number of cigarettes/cigars currently smoking daily: 18   LOW BACK PAIN, CHRONIC Acute flare following recent injury, toradol and depo medrol administered IM in the office  Patient non-compliant, refused service Continues to rerfuse colonoscopy and rectal exam, stool cards received at visit to be returned, for the first time  B12 deficiency anemia B12 administered IM at visit  Need for Tdap vaccination Abrasion w to right hand following injury 1 day ago, TdAP administered after informed consent No evidence of bacterial superinfection, advised patient to keep area clean and use topical antibiotic

## 2014-12-22 ENCOUNTER — Encounter: Payer: Self-pay | Admitting: Family Medicine

## 2014-12-22 DIAGNOSIS — Z0000 Encounter for general adult medical examination without abnormal findings: Secondary | ICD-10-CM | POA: Insufficient documentation

## 2014-12-22 DIAGNOSIS — Z23 Encounter for immunization: Secondary | ICD-10-CM | POA: Insufficient documentation

## 2014-12-22 NOTE — Assessment & Plan Note (Signed)

## 2014-12-22 NOTE — Assessment & Plan Note (Signed)
B12 administered IM at visit 

## 2014-12-22 NOTE — Assessment & Plan Note (Signed)
Acute flare following recent injury, toradol and depo medrol administered IM in the office

## 2014-12-22 NOTE — Assessment & Plan Note (Signed)
Abrasion w to right hand following injury 1 day ago, TdAP administered after informed consent No evidence of bacterial superinfection, advised patient to keep area clean and use topical antibiotic

## 2014-12-22 NOTE — Assessment & Plan Note (Signed)
Continues to rerfuse colonoscopy and rectal exam, stool cards received at visit to be returned, for the first time

## 2014-12-22 NOTE — Assessment & Plan Note (Signed)

## 2015-01-05 ENCOUNTER — Other Ambulatory Visit: Payer: Self-pay | Admitting: Family Medicine

## 2015-01-12 ENCOUNTER — Other Ambulatory Visit: Payer: Self-pay

## 2015-01-12 ENCOUNTER — Emergency Department (HOSPITAL_COMMUNITY): Payer: PPO

## 2015-01-12 ENCOUNTER — Inpatient Hospital Stay (HOSPITAL_COMMUNITY)
Admission: EM | Admit: 2015-01-12 | Discharge: 2015-01-14 | DRG: 637 | Disposition: A | Discharge status: Home / Self Care | Payer: PPO | Attending: Internal Medicine | Admitting: Internal Medicine

## 2015-01-12 ENCOUNTER — Encounter (HOSPITAL_COMMUNITY): Payer: Self-pay | Admitting: Emergency Medicine

## 2015-01-12 DIAGNOSIS — E860 Dehydration: Secondary | ICD-10-CM | POA: Diagnosis present

## 2015-01-12 DIAGNOSIS — K219 Gastro-esophageal reflux disease without esophagitis: Secondary | ICD-10-CM | POA: Diagnosis present

## 2015-01-12 DIAGNOSIS — D649 Anemia, unspecified: Secondary | ICD-10-CM | POA: Diagnosis present

## 2015-01-12 DIAGNOSIS — E1165 Type 2 diabetes mellitus with hyperglycemia: Secondary | ICD-10-CM | POA: Diagnosis present

## 2015-01-12 DIAGNOSIS — Z794 Long term (current) use of insulin: Secondary | ICD-10-CM | POA: Diagnosis not present

## 2015-01-12 DIAGNOSIS — I441 Atrioventricular block, second degree: Secondary | ICD-10-CM | POA: Diagnosis present

## 2015-01-12 DIAGNOSIS — E785 Hyperlipidemia, unspecified: Secondary | ICD-10-CM | POA: Diagnosis present

## 2015-01-12 DIAGNOSIS — N289 Disorder of kidney and ureter, unspecified: Secondary | ICD-10-CM

## 2015-01-12 DIAGNOSIS — I10 Essential (primary) hypertension: Secondary | ICD-10-CM | POA: Diagnosis present

## 2015-01-12 DIAGNOSIS — Z833 Family history of diabetes mellitus: Secondary | ICD-10-CM

## 2015-01-12 DIAGNOSIS — Z823 Family history of stroke: Secondary | ICD-10-CM | POA: Diagnosis not present

## 2015-01-12 DIAGNOSIS — R4182 Altered mental status, unspecified: Secondary | ICD-10-CM | POA: Diagnosis present

## 2015-01-12 DIAGNOSIS — E871 Hypo-osmolality and hyponatremia: Secondary | ICD-10-CM | POA: Diagnosis present

## 2015-01-12 DIAGNOSIS — IMO0001 Reserved for inherently not codable concepts without codable children: Secondary | ICD-10-CM

## 2015-01-12 DIAGNOSIS — R739 Hyperglycemia, unspecified: Secondary | ICD-10-CM

## 2015-01-12 DIAGNOSIS — G9340 Encephalopathy, unspecified: Secondary | ICD-10-CM | POA: Diagnosis present

## 2015-01-12 DIAGNOSIS — E109 Type 1 diabetes mellitus without complications: Secondary | ICD-10-CM | POA: Diagnosis not present

## 2015-01-12 DIAGNOSIS — F419 Anxiety disorder, unspecified: Secondary | ICD-10-CM | POA: Diagnosis present

## 2015-01-12 DIAGNOSIS — F17210 Nicotine dependence, cigarettes, uncomplicated: Secondary | ICD-10-CM | POA: Diagnosis present

## 2015-01-12 DIAGNOSIS — E1159 Type 2 diabetes mellitus with other circulatory complications: Secondary | ICD-10-CM | POA: Diagnosis present

## 2015-01-12 DIAGNOSIS — E119 Type 2 diabetes mellitus without complications: Secondary | ICD-10-CM

## 2015-01-12 LAB — RAPID URINE DRUG SCREEN, HOSP PERFORMED
AMPHETAMINES: NOT DETECTED
BARBITURATES: NOT DETECTED
Benzodiazepines: POSITIVE — AB
Cocaine: NOT DETECTED
Opiates: NOT DETECTED
TETRAHYDROCANNABINOL: NOT DETECTED

## 2015-01-12 LAB — URINE MICROSCOPIC-ADD ON

## 2015-01-12 LAB — CBG MONITORING, ED
GLUCOSE-CAPILLARY: 433 mg/dL — AB (ref 65–99)
Glucose-Capillary: 191 mg/dL — ABNORMAL HIGH (ref 65–99)

## 2015-01-12 LAB — ETHANOL

## 2015-01-12 LAB — URINALYSIS, ROUTINE W REFLEX MICROSCOPIC
Bilirubin Urine: NEGATIVE
HGB URINE DIPSTICK: NEGATIVE
Ketones, ur: NEGATIVE mg/dL
Leukocytes, UA: NEGATIVE
Nitrite: NEGATIVE
PH: 6 (ref 5.0–8.0)
Protein, ur: NEGATIVE mg/dL

## 2015-01-12 LAB — CBC
HEMATOCRIT: 35.8 % — AB (ref 39.0–52.0)
HEMOGLOBIN: 12.8 g/dL — AB (ref 13.0–17.0)
MCH: 31.7 pg (ref 26.0–34.0)
MCHC: 35.8 g/dL (ref 30.0–36.0)
MCV: 88.6 fL (ref 78.0–100.0)
Platelets: 231 10*3/uL (ref 150–400)
RBC: 4.04 MIL/uL — ABNORMAL LOW (ref 4.22–5.81)
RDW: 13.4 % (ref 11.5–15.5)
WBC: 10.7 10*3/uL — ABNORMAL HIGH (ref 4.0–10.5)

## 2015-01-12 LAB — BASIC METABOLIC PANEL
Anion gap: 9 (ref 5–15)
BUN: 27 mg/dL — AB (ref 6–20)
CALCIUM: 9.7 mg/dL (ref 8.9–10.3)
CHLORIDE: 90 mmol/L — AB (ref 101–111)
CO2: 23 mmol/L (ref 22–32)
CREATININE: 1.46 mg/dL — AB (ref 0.61–1.24)
GFR calc Af Amer: 53 mL/min — ABNORMAL LOW (ref >60)
GFR calc non Af Amer: 46 mL/min — ABNORMAL LOW (ref >60)
Glucose, Bld: 402 mg/dL — ABNORMAL HIGH (ref 65–99)
Potassium: 5 mmol/L (ref 3.5–5.1)
SODIUM: 122 mmol/L — AB (ref 135–145)

## 2015-01-12 LAB — GLUCOSE, CAPILLARY: Glucose-Capillary: 304 mg/dL — ABNORMAL HIGH (ref 65–99)

## 2015-01-12 MED ORDER — HYDROCODONE-ACETAMINOPHEN 7.5-325 MG PO TABS: 1.0000 | ORAL_TABLET | Freq: Two times a day (BID) | ORAL | Status: DC | PRN

## 2015-01-12 MED ORDER — ONDANSETRON HCL 4 MG/2ML IJ SOLN: 4.0000 mg | Freq: Four times a day (QID) | INTRAMUSCULAR | Status: DC | PRN

## 2015-01-12 MED ORDER — AMLODIPINE BESYLATE 5 MG PO TABS
10.0000 mg | ORAL_TABLET | Freq: Every day | ORAL | Status: DC
  Administered 2015-01-12: 5 mg via ORAL
  Administered 2015-01-13 – 2015-01-14 (×2): 10 mg via ORAL
  Filled 2015-01-12 (×3): qty 2

## 2015-01-12 MED ORDER — ONDANSETRON HCL 4 MG PO TABS: 4.0000 mg | ORAL_TABLET | Freq: Four times a day (QID) | ORAL | Status: DC | PRN

## 2015-01-12 MED ORDER — MOMETASONE FURO-FORMOTEROL FUM 100-5 MCG/ACT IN AERO
INHALATION_SPRAY | RESPIRATORY_TRACT | Status: AC
  Filled 2015-01-12: qty 8.8

## 2015-01-12 MED ORDER — POLYETHYLENE GLYCOL 3350 17 G PO PACK: 17.0000 g | PACK | Freq: Every day | ORAL | Status: DC | PRN

## 2015-01-12 MED ORDER — SODIUM CHLORIDE 0.9 % IJ SOLN
3.0000 mL | Freq: Two times a day (BID) | INTRAMUSCULAR | Status: DC
Start: 2015-01-12 — End: 2015-01-14
  Administered 2015-01-12 – 2015-01-14 (×4): 3 mL via INTRAVENOUS

## 2015-01-12 MED ORDER — LINAGLIPTIN 5 MG PO TABS
5.0000 mg | ORAL_TABLET | Freq: Every day | ORAL | Status: DC
  Administered 2015-01-12 – 2015-01-14 (×3): 5 mg via ORAL
  Filled 2015-01-12 (×7): qty 1

## 2015-01-12 MED ORDER — PANTOPRAZOLE SODIUM 40 MG PO TBEC
40.0000 mg | DELAYED_RELEASE_TABLET | Freq: Every day | ORAL | Status: DC
  Administered 2015-01-12 – 2015-01-14 (×3): 40 mg via ORAL
  Filled 2015-01-12 (×3): qty 1

## 2015-01-12 MED ORDER — ASPIRIN EC 325 MG PO TBEC
325.0000 mg | DELAYED_RELEASE_TABLET | Freq: Every day | ORAL | Status: DC
  Administered 2015-01-12 – 2015-01-14 (×3): 325 mg via ORAL
  Filled 2015-01-12 (×3): qty 1

## 2015-01-12 MED ORDER — TEMAZEPAM 15 MG PO CAPS
15.0000 mg | ORAL_CAPSULE | Freq: Every evening | ORAL | Status: DC | PRN
Start: 2015-01-12 — End: 2015-01-14
  Administered 2015-01-13: 15 mg via ORAL
  Filled 2015-01-12: qty 1

## 2015-01-12 MED ORDER — MOMETASONE FURO-FORMOTEROL FUM 100-5 MCG/ACT IN AERO
2.0000 | INHALATION_SPRAY | Freq: Two times a day (BID) | RESPIRATORY_TRACT | Status: DC
  Administered 2015-01-13 (×2): 2 via RESPIRATORY_TRACT
  Filled 2015-01-12: qty 8.8

## 2015-01-12 MED ORDER — LOSARTAN POTASSIUM 50 MG PO TABS
100.0000 mg | ORAL_TABLET | Freq: Every day | ORAL | Status: DC
  Administered 2015-01-12 – 2015-01-14 (×3): 100 mg via ORAL
  Filled 2015-01-12 (×5): qty 2

## 2015-01-12 MED ORDER — GABAPENTIN 300 MG PO CAPS
600.0000 mg | ORAL_CAPSULE | Freq: Every day | ORAL | Status: DC
  Administered 2015-01-12 – 2015-01-13 (×2): 600 mg via ORAL
  Filled 2015-01-12 (×2): qty 2

## 2015-01-12 MED ORDER — TAMSULOSIN HCL 0.4 MG PO CAPS
0.4000 mg | ORAL_CAPSULE | Freq: Every day | ORAL | Status: DC
  Administered 2015-01-12 – 2015-01-14 (×3): 0.4 mg via ORAL
  Filled 2015-01-12 (×2): qty 1

## 2015-01-12 MED ORDER — TEMAZEPAM 15 MG PO CAPS
15.0000 mg | ORAL_CAPSULE | Freq: Every evening | ORAL | Status: DC | PRN
Start: 2015-01-12 — End: 2015-01-12
  Administered 2015-01-12: 15 mg via ORAL
  Filled 2015-01-12: qty 1

## 2015-01-12 MED ORDER — B COMPLEX-C PO TABS
1.0000 | ORAL_TABLET | Freq: Every day | ORAL | Status: DC
  Administered 2015-01-13 – 2015-01-14 (×2): 1 via ORAL
  Filled 2015-01-12 (×5): qty 1

## 2015-01-12 MED ORDER — INSULIN ASPART 100 UNIT/ML ~~LOC~~ SOLN
0.0000 [IU] | Freq: Every day | SUBCUTANEOUS | Status: DC
  Administered 2015-01-12: 4 [IU] via SUBCUTANEOUS

## 2015-01-12 MED ORDER — GABAPENTIN 300 MG PO CAPS
300.0000 mg | ORAL_CAPSULE | Freq: Three times a day (TID) | ORAL | Status: DC
  Administered 2015-01-12: 300 mg via ORAL

## 2015-01-12 MED ORDER — INSULIN GLARGINE 100 UNIT/ML ~~LOC~~ SOLN
7.0000 [IU] | Freq: Every day | SUBCUTANEOUS | Status: DC
  Administered 2015-01-12 – 2015-01-13 (×2): 7 [IU] via SUBCUTANEOUS
  Filled 2015-01-12 (×3): qty 0.07

## 2015-01-12 MED ORDER — ALPRAZOLAM 0.5 MG PO TABS: 0.5000 mg | ORAL_TABLET | Freq: Every evening | ORAL | Status: DC | PRN

## 2015-01-12 MED ORDER — INSULIN ASPART 100 UNIT/ML ~~LOC~~ SOLN
0.0000 [IU] | Freq: Three times a day (TID) | SUBCUTANEOUS | Status: DC
  Administered 2015-01-12: 3 [IU] via SUBCUTANEOUS
  Administered 2015-01-13: 5 [IU] via SUBCUTANEOUS
  Administered 2015-01-13: 11 [IU] via SUBCUTANEOUS
  Administered 2015-01-14: 8 [IU] via SUBCUTANEOUS
  Filled 2015-01-12: qty 1

## 2015-01-12 MED ORDER — ENOXAPARIN SODIUM 40 MG/0.4ML ~~LOC~~ SOLN
40.0000 mg | SUBCUTANEOUS | Status: DC
  Administered 2015-01-13: 40 mg via SUBCUTANEOUS
  Filled 2015-01-12: qty 0.4

## 2015-01-12 MED ORDER — GABAPENTIN 300 MG PO CAPS
300.0000 mg | ORAL_CAPSULE | Freq: Two times a day (BID) | ORAL | Status: DC
  Administered 2015-01-13 – 2015-01-14 (×3): 300 mg via ORAL
  Filled 2015-01-12 (×3): qty 1

## 2015-01-12 MED ORDER — INSULIN GLARGINE 100 UNIT/ML SOLOSTAR PEN: 7.0000 [IU] | PEN_INJECTOR | Freq: Every day | SUBCUTANEOUS | Status: DC

## 2015-01-12 MED ORDER — SODIUM CHLORIDE 0.9 % IV SOLN
INTRAVENOUS | Status: DC
Start: 2015-01-12 — End: 2015-01-14
  Administered 2015-01-12 – 2015-01-13 (×4): via INTRAVENOUS

## 2015-01-12 MED ORDER — ALPRAZOLAM 0.5 MG PO TABS
0.5000 mg | ORAL_TABLET | Freq: Every evening | ORAL | Status: DC | PRN
  Administered 2015-01-12 – 2015-01-13 (×2): 0.5 mg via ORAL
  Filled 2015-01-12 (×2): qty 1

## 2015-01-12 MED ORDER — SODIUM CHLORIDE 0.9 % IV BOLUS (SEPSIS)
1000.0000 mL | Freq: Once | INTRAVENOUS | Status: AC
  Administered 2015-01-12: 1000 mL via INTRAVENOUS

## 2015-01-12 MED ORDER — ENOXAPARIN SODIUM 30 MG/0.3ML ~~LOC~~ SOLN
30.0000 mg | SUBCUTANEOUS | Status: DC
Start: 2015-01-12 — End: 2015-01-12
  Administered 2015-01-12: 30 mg via SUBCUTANEOUS
  Filled 2015-01-12: qty 0.3

## 2015-01-12 MED ORDER — PRAVASTATIN SODIUM 10 MG PO TABS
20.0000 mg | ORAL_TABLET | Freq: Every morning | ORAL | Status: DC
  Administered 2015-01-13 – 2015-01-14 (×2): 20 mg via ORAL
  Filled 2015-01-12 (×2): qty 2
  Filled 2015-01-12 (×2): qty 1

## 2015-01-12 NOTE — ED Notes (Signed)
Pt's wife is Melburn PopperBarbara Jones and she can be reached at [phone] or [phone]. She was notified of pt's admission status and room number.

## 2015-01-12 NOTE — ED Notes (Signed)
Error in documentation for hourly rounds. No seizure pads needed

## 2015-01-12 NOTE — ED Notes (Signed)
Gave pt urinal 

## 2015-01-12 NOTE — H&P (Signed)
[facility] History and Physical  Donald Everett WUJ:[id number] DOB: 30-Mar-1940 DOA: 01/12/2015  Referring physician: ER PCP: Donald Overman, MD   Chief Complaint: Altered mental status  HPI: Donald Everett is a 74 y.o. male  This is a 74 year old man who comes to the emergency room with altered mental status which started today. He apparently called the sheriff's department saying that is bread was moldy and he was apparently upset because he thought he was going to jail. This was all rather confusing. He also told me that he did not take his usual dose of insulin this morning because he was upset. He checks his sugars 2-3 times a day and yesterday his numbers were in his normal range. He denies any polyuria or polydipsia. According to the sister at the bedside, he apparently has had a stroke before but I do not see any documentation to that. Evaluation in the emergency room showed him to be hyponatremic and hyperglycemic. CT scan of the brain does not show any acute CVA. He is now being admitted for further management.   Review of Systems:  Unable to obtain secondary to the patient's altered mental status.  Past Medical History  Diagnosis Date  . Allergy   . Anxiety   . Hyperlipidemia   . Hypertension   . ED (erectile dysfunction)   . Nicotine addiction   . Chronic low back pain   . Insomnia   . COPD (chronic obstructive pulmonary disease) (HCC)   . Peripheral arterial disease (HCC)   . Renal artery stenosis (HCC)   . Type II diabetes mellitus (HCC)   . GERD (gastroesophageal reflux disease)   . Arthritis     "joints" (02/25/2013)  . Depression    Past Surgical History  Procedure Laterality Date  . Femoral artery stent Right 02/25/2013  . Lower extremity angiogram Bilateral 02/25/2013    Procedure: LOWER EXTREMITY ANGIOGRAM;  Surgeon: Donald Gess, MD;  Location: [facility] CATH LAB;  Service: Cardiovascular;  Laterality: Bilateral;   Social History:  reports that he  has been smoking Cigarettes.  He has a 57 pack-year smoking history. He has quit using smokeless tobacco. His smokeless tobacco use included Chew. He reports that he drinks about 8.4 oz of alcohol per week. He reports that he does not use illicit drugs.  No Known Allergies  Family History  Problem Relation Age of Onset  . Diabetes Mother   . Diabetes Father   . Stroke Father   . Diabetes Sister     Prior to Admission medications   Medication Sig Start Date End Date Taking? Authorizing Provider  ALPRAZolam (XANAX) 0.5 MG tablet TAKE ONE TABLET BY MOUTH AT BEDTIME. MUST LAST 90 DAYS. 01/05/15  Yes Donald Perches, MD  amLODipine (NORVASC) 10 MG tablet Take 1 tablet (10 mg total) by mouth daily. 09/01/14  Yes Donald Perches, MD  aspirin EC 325 MG EC tablet Take 1 tablet (325 mg total) by mouth daily. 02/26/13  Yes Donald Darting Hager, PA-C  B Complex-C (B-COMPLEX WITH VITAMIN C) tablet Take 1 tablet by mouth daily.   Yes Historical Provider, MD  Fluticasone-Salmeterol (ADVAIR DISKUS) 100-50 MCG/DOSE AEPB Inhale 1 puff into the lungs 2 (two) times daily. 03/11/14 03/11/15 Yes Donald Perches, MD  gabapentin (NEURONTIN) 300 MG capsule One cap twice daily and 2 at bedtime Patient taking differently: Take 300-600 mg by mouth 3 (three) times daily. One capsule twice daily and 2 capsue at bedtime. 09/01/14  Yes  Donald PerchesMargaret E Simpson, MD  HYDROcodone-acetaminophen (NORCO) 7.5-325 MG tablet Take 1 tablet by mouth 2 (two) times daily as needed. Patient taking differently: Take 1 tablet by mouth 2 (two) times daily.  01/12/15  Yes Donald PerchesMargaret E Simpson, MD  insulin aspart protamine- aspart (NOVOLOG MIX 70/30) (70-30) 100 UNIT/ML injection Inject 0.35 mLs (35 Units total) into the skin 2 (two) times daily with a meal. 03/11/14  Yes Donald PerchesMargaret E Simpson, MD  Insulin Glargine (LANTUS) 100 UNIT/ML Solostar Pen Fifteen units once daily every morning Patient taking differently: Inject 7 Units into the skin daily at 10  pm. Fifteen units once daily every morning 05/26/14  Yes Donald PerchesMargaret E Simpson, MD  JANUVIA 100 MG tablet TAKE ONE TABLET BY MOUTH DAILY. 01/05/15  Yes Donald PerchesMargaret E Simpson, MD  losartan (COZAAR) 100 MG tablet TAKE ONE TABLET BY MOUTH DAILY. 01/05/15  Yes Donald PerchesMargaret E Simpson, MD  metFORMIN (GLUCOPHAGE) 1000 MG tablet TAKE ONE TABLET BY MOUTH TWICE DAILY WITH A MEAL. 01/05/15  Yes Donald PerchesMargaret E Simpson, MD  pantoprazole (PROTONIX) 40 MG tablet TAKE ONE TABLET BY MOUTH DAILY. 01/05/15  Yes Donald PerchesMargaret E Simpson, MD  polyethylene glycol [facility](MIRALAX / Ethelene HalGLYCOLAX) packet Take 17 g by mouth daily as needed for mild constipation. Mix with 8 oz of water.   Yes Historical Provider, MD  pravastatin (PRAVACHOL) 20 MG tablet TAKE ONE TABLET BY MOUTH EVERY MORNING. 12/03/14  Yes Donald PerchesMargaret E Simpson, MD  spironolactone (ALDACTONE) 50 MG tablet TAKE ONE TABLET BY MOUTH TWICE DAILY. 01/05/15  Yes Donald PerchesMargaret E Simpson, MD  tamsulosin (FLOMAX) 0.4 MG CAPS capsule TAKE ONE CAPSULE BY MOUTH DAILY. 12/03/14  Yes Donald PerchesMargaret E Simpson, MD  temazepam (RESTORIL) 15 MG capsule TAKE ONE CAPSULE BY MOUTH AT BEDTIME AS NEEDED FOR SLEEP. 01/05/15  Yes Donald PerchesMargaret E Simpson, MD  Alcohol Swabs PADS For use with blood sugar checks 09/30/14   Donald PerchesMargaret E Simpson, MD  B-D ULTRAFINE III SHORT PEN 31G X 8 MM MISC USE AS DIRECTED. 09/17/14   Donald PerchesMargaret E Simpson, MD  fluticasone (FLONASE) 50 MCG/ACT nasal spray Place 1 spray into both nostrils daily. Patient not taking: Reported on 01/12/2015 03/11/14   Donald PerchesMargaret E Simpson, MD  ONE TOUCH ULTRA TEST test strip USE TO TEST THREE TIMES DAILY. 09/30/14   Donald PerchesMargaret E Simpson, MD   Physical Exam: Filed Vitals:   01/12/15 1216 01/12/15 1551 01/12/15 1600  BP: 165/75 159/78 148/75  Pulse: 95 75 138  Temp: 97.7 F (36.5 C)    TempSrc: Oral    Resp: 18 18 21   Height: 5\' 9"  (1.753 m)    Weight: 73.936 kg (163 lb)    SpO2: 99% 100% 96%    Wt Readings from Last 3 Encounters:  01/12/15 73.936 kg (163 lb)  12/16/14 73.483  kg (162 lb)  11/18/14 74.844 kg (165 lb)    General:  Appears calm and comfortable. He does appear to be confused based on my conversation with him. He understands he is in the hospital. He does look clinically dehydrated.  Eyes: PERRL, normal lids, irises & conjunctiva ENT: grossly normal hearing, lips & tongue Neck: no LAD, masses or thyromegaly Cardiovascuirregular. No indication of heart failure. Telemetry:  sinus rhythm with bradycardia and ectopic beats.  Respiratory: CTA bilaterally, no w/r/r. Normal respiratory effort. Abdomen: soft, ntnd Skin: no rash or induration seen on limited exam Musculoskeletal: grossly normal tone BUE/BLE Psychiatric: grossly normal mood and affect, speech fluent and appropriate Neurologic: grossly non-focal.  Labs on Admission:  Basic Metabolic Panel:  Recent Labs Lab 01/12/15 1231  NA 122*  K 5.0  CL 90*  CO2 23  GLUCOSE 402*  BUN 27*  CREATININE 1.46*  CALCIUM 9.7   Liver Function Tests: No results for input(s): AST, ALT, ALKPHOS, BILITOT, PROT, ALBUMIN in the last 168 hours. No results for input(s): LIPASE, AMYLASE in the last 168 hours. No results for input(s): AMMONIA in the last 168 hours. CBC:  Recent Labs Lab 01/12/15 1231  WBC 10.7*  HGB 12.8*  HCT 35.8*  MCV 88.6  PLT 231   Cardiac Enzymes: No results for input(s): CKTOTAL, CKMB, CKMBINDEX, TROPONINI in the last 168 hours.  BNP (last 3 results) No results for input(s): BNP in the last 8760 hours.  ProBNP (last 3 results) No results for input(s): PROBNP in the last 8760 hours.  CBG:  Recent Labs Lab 01/12/15 1219  GLUCAP 433*    Radiological Exams on Admission: Dg Chest 2 View  01/12/2015  CLINICAL DATA:  Cough, shortness of breath, smoker, hypertension, diabetes mellitus EXAM: CHEST  2 VIEW COMPARISON:  02/19/2013 FINDINGS: Normal heart size, mediastinal contours, and pulmonary vascularity. Mild chronic bronchitic and interstitial changes grossly  stable when accounting for differences in technique. No definite pulmonary infiltrate, pleural effusion or pneumothorax. Tiny calcified granuloma RIGHT upper lobe stable. Bones demineralized. 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 No acute abnormalities. Electronically Signed   By: Ulyses Southward M.D.   On: 01/12/2015 14:54   Ct Head Wo Contrast  01/12/2015  CLINICAL DATA:  Confusion/altered mental status ; hyperglycemia EXAM: CT HEAD WITHOUT CONTRAST TECHNIQUE: Contiguous axial images were obtained from the base of the skull through the vertex without intravenous contrast. COMPARISON:  February 07, 2012 FINDINGS: There is mild diffuse atrophy. There is no intracranial mass, hemorrhage, extra-axial fluid collection, or midline shift. There is patchy small vessel disease in the centra semiovale bilaterally. Elsewhere the gray-white compartments appear normal. No acute infarct evident. The bony calvarium appears intact. The mastoid air cells are clear. IMPRESSION: Atrophy with periventricular small vessel disease, stable. No acute infarct evident. No hemorrhage or mass effect. Electronically Signed   By: Bretta Bang III M.D.   On: 01/12/2015 15:45    EKG: Independently reviewed.Mobitz type I second-degree AV block. Assessment/Plan   1. Altered mental status. The etiology is multifactorial. He does not appear to have any large acute CVA but he does appear to have hyponatremia and hyperglycemia, both of which could be responsible for his altered mental status. He'll be treated with IV fluids and his diabetes will be corrected for better control. If his altered mental status does not improve with this, I would consider MRI brain scan to make sure he has not had a small CVA. 2. Uncontrolled diabetes. Continue with most of his home medications and sliding scale of insulin. I don't think he needs IV insulin at the present time. 3. Hyponatremia. This is likely related to  dehydration and hyperglycemia. Hold Spironolactone for the time being. Monitor electrolytes closely.   He'll be admitted to the stepdown unit. Further recommendations will depend on patient's hospital progress.    Code Status: full code.    DVT Prophylaxis: Lovenox.   Family Communication: I discussed the plan with the patient's sister at the bedside as well as the patient  Disposition Plan: home when medically stable.   Time spent: 60 minutes.   Donald Everett [facility] Pager [phone].

## 2015-01-12 NOTE — ED Notes (Signed)
MD at bedside. 

## 2015-01-12 NOTE — ED Provider Notes (Signed)
CSN: [id number]     Arrival date & time 01/12/15  1210 History   First MD Initiated Contact with Patient 01/12/15 1331     Chief Complaint  Patient presents with  . Hyperglycemia     (Consider location/radiation/quality/duration/timing/severity/associated sxs/prior Treatment) Patient is a 74 y.o. male presenting with hyperglycemia. The history is provided by the patient and a relative.  Hyperglycemia Associated symptoms: confusion   Associated symptoms: no abdominal pain, no chest pain, no polyuria and no weakness    patient presents for hyperglycemia. States the sugar went up to 400 today. States he normally takes 30 units of insulin this morning but he was upset so he only took 10 units. Was a poorly upset because he has called police and thought he was going to may be taken to jail. Reportedly called because he had some moldy bread according to his sister. Patient denies being confused but states he does get confused at times when he gets agitated. States his sugars normally run lower than this. Denies chest pain.. Denies dysuria. Denies fevers. Denies polyuria.  Past Medical History  Diagnosis Date  . Allergy   . Anxiety   . Hyperlipidemia   . Hypertension   . ED (erectile dysfunction)   . Nicotine addiction   . Chronic low back pain   . Insomnia   . COPD (chronic obstructive pulmonary disease) (HCC)   . Peripheral arterial disease (HCC)   . Renal artery stenosis (HCC)   . Type II diabetes mellitus (HCC)   . GERD (gastroesophageal reflux disease)   . Arthritis     "joints" (02/25/2013)  . Depression    Past Surgical History  Procedure Laterality Date  . Femoral artery stent Right 02/25/2013  . Lower extremity angiogram Bilateral 02/25/2013    Procedure: LOWER EXTREMITY ANGIOGRAM;  Surgeon: Runell Gess, MD;  Location: [facility] CATH LAB;  Service: Cardiovascular;  Laterality: Bilateral;   Family History  Problem Relation Age of Onset  . Diabetes Mother   . Diabetes Father    . Stroke Father   . Diabetes Sister    Social History  Substance Use Topics  . Smoking status: Current Every Day Smoker -- 1.00 packs/day for 57 years    Types: Cigarettes  . Smokeless tobacco: Former User    Types: Chew     Comment: 02/25/2013 "chewed a little back in the 1960's"  . Alcohol Use: 8.4 oz/week    14 Cans of beer per week    Review of Systems  Constitutional: Negative for activity change.  Cardiovascular: Negative for chest pain.  Gastrointestinal: Negative for abdominal pain.  Endocrine: Negative for polyphagia and polyuria.  Genitourinary: Negative for frequency and flank pain.  Musculoskeletal: Negative for back pain.  Neurological: Negative for weakness and numbness.  Psychiatric/Behavioral: Positive for confusion.      Allergies  Review of patient's allergies indicates no known allergies.  Home Medications   Prior to Admission medications   Medication Sig Start Date End Date Taking? Authorizing Provider  ALPRAZolam (XANAX) 0.5 MG tablet TAKE ONE TABLET BY MOUTH AT BEDTIME. MUST LAST 90 DAYS. 01/05/15  Yes Kerri Perches, MD  amLODipine (NORVASC) 10 MG tablet Take 1 tablet (10 mg total) by mouth daily. 09/01/14  Yes Kerri Perches, MD  aspirin EC 325 MG EC tablet Take 1 tablet (325 mg total) by mouth daily. 02/26/13  Yes Kelle Darting Hager, PA-C  B Complex-C (B-COMPLEX WITH VITAMIN C) tablet Take 1 tablet by mouth daily.  Yes Historical Provider, MD  Fluticasone-Salmeterol (ADVAIR DISKUS) 100-50 MCG/DOSE AEPB Inhale 1 puff into the lungs 2 (two) times daily. 03/11/14 03/11/15 Yes Kerri PerchesMargaret E Simpson, MD  gabapentin (NEURONTIN) 300 MG capsule One cap twice daily and 2 at bedtime Patient taking differently: Take 300-600 mg by mouth 3 (three) times daily. One capsule twice daily and 2 capsue at bedtime. 09/01/14  Yes Kerri PerchesMargaret E Simpson, MD  HYDROcodone-acetaminophen (NORCO) 7.5-325 MG tablet Take 1 tablet by mouth 2 (two) times daily as needed. Patient taking  differently: Take 1 tablet by mouth 2 (two) times daily.  01/12/15  Yes Kerri PerchesMargaret E Simpson, MD  insulin aspart protamine- aspart (NOVOLOG MIX 70/30) (70-30) 100 UNIT/ML injection Inject 0.35 mLs (35 Units total) into the skin 2 (two) times daily with a meal. 03/11/14  Yes Kerri PerchesMargaret E Simpson, MD  Insulin Glargine (LANTUS) 100 UNIT/ML Solostar Pen Fifteen units once daily every morning Patient taking differently: Inject 7 Units into the skin daily at 10 pm. Fifteen units once daily every morning 05/26/14  Yes Kerri PerchesMargaret E Simpson, MD  JANUVIA 100 MG tablet TAKE ONE TABLET BY MOUTH DAILY. 01/05/15  Yes Kerri PerchesMargaret E Simpson, MD  losartan (COZAAR) 100 MG tablet TAKE ONE TABLET BY MOUTH DAILY. 01/05/15  Yes Kerri PerchesMargaret E Simpson, MD  metFORMIN (GLUCOPHAGE) 1000 MG tablet TAKE ONE TABLET BY MOUTH TWICE DAILY WITH A MEAL. 01/05/15  Yes Kerri PerchesMargaret E Simpson, MD  pantoprazole (PROTONIX) 40 MG tablet TAKE ONE TABLET BY MOUTH DAILY. 01/05/15  Yes Kerri PerchesMargaret E Simpson, MD  polyethylene glycol [facility](MIRALAX / Ethelene HalGLYCOLAX) packet Take 17 g by mouth daily as needed for mild constipation. Mix with 8 oz of water.   Yes Historical Provider, MD  pravastatin (PRAVACHOL) 20 MG tablet TAKE ONE TABLET BY MOUTH EVERY MORNING. 12/03/14  Yes Kerri PerchesMargaret E Simpson, MD  spironolactone (ALDACTONE) 50 MG tablet TAKE ONE TABLET BY MOUTH TWICE DAILY. 01/05/15  Yes Kerri PerchesMargaret E Simpson, MD  tamsulosin (FLOMAX) 0.4 MG CAPS capsule TAKE ONE CAPSULE BY MOUTH DAILY. 12/03/14  Yes Kerri PerchesMargaret E Simpson, MD  temazepam (RESTORIL) 15 MG capsule TAKE ONE CAPSULE BY MOUTH AT BEDTIME AS NEEDED FOR SLEEP. 01/05/15  Yes Kerri PerchesMargaret E Simpson, MD  Alcohol Swabs PADS For use with blood sugar checks 09/30/14   Kerri PerchesMargaret E Simpson, MD  B-D ULTRAFINE III SHORT PEN 31G X 8 MM MISC USE AS DIRECTED. 09/17/14   Kerri PerchesMargaret E Simpson, MD  fluticasone (FLONASE) 50 MCG/ACT nasal spray Place 1 spray into both nostrils daily. Patient not taking: Reported on 01/12/2015 03/11/14   Kerri PerchesMargaret E Simpson,  MD  ONE TOUCH ULTRA TEST test strip USE TO TEST THREE TIMES DAILY. 09/30/14   Kerri PerchesMargaret E Simpson, MD   BP 165/75 mmHg  Pulse 95  Temp(Src) 97.7 F (36.5 C) (Oral)  Resp 18  Ht 5\' 9"  (1.753 m)  Wt 163 lb (73.936 kg)  BMI 24.06 kg/m2  SpO2 99% Physical Exam  Constitutional: He is oriented to person, place, and time. He appears well-developed.  HENT:  Head: Atraumatic.  Neck: Neck supple.  Cardiovascular: Normal rate and regular rhythm.   Pulmonary/Chest: Effort normal.  Abdominal: Soft.  Musculoskeletal: Normal range of motion.  Neurological: He is alert and oriented to person, place, and time.  Psychiatric: He has a normal mood and affect.    ED Course  Procedures (including critical care time) Labs Review Labs Reviewed  BASIC METABOLIC PANEL - Abnormal; Notable for the following:    Sodium 122 (*)    Chloride  90 (*)    Glucose, Bld 402 (*)    BUN 27 (*)    Creatinine, Ser 1.46 (*)    GFR calc non Af Amer 46 (*)    GFR calc Af Amer 53 (*)    All other components within normal limits  CBC - Abnormal; Notable for the following:    WBC 10.7 (*)    RBC 4.04 (*)    Hemoglobin 12.8 (*)    HCT 35.8 (*)    All other components within normal limits  URINALYSIS, ROUTINE W REFLEX MICROSCOPIC (NOT AT [facility]) - Abnormal; Notable for the following:    Specific Gravity, Urine <1.005 (*)    Glucose, UA >1000 (*)    All other components within normal limits  URINE RAPID DRUG SCREEN, HOSP PERFORMED - Abnormal; Notable for the following:    Benzodiazepines POSITIVE (*)    All other components within normal limits  URINE MICROSCOPIC-ADD ON - Abnormal; Notable for the following:    Squamous Epithelial / LPF 0-5 (*)    Bacteria, UA RARE (*)    All other components within normal limits  CBG MONITORING, ED - Abnormal; Notable for the following:    Glucose-Capillary 433 (*)    All other components within normal limits  ETHANOL  CBG MONITORING, ED    Imaging Review Dg Chest 2  View  01/12/2015  CLINICAL DATA:  Cough, shortness of breath, smoker, hypertension, diabetes mellitus EXAM: CHEST  2 VIEW COMPARISON:  02/19/2013 FINDINGS: Normal heart size, mediastinal contours, and pulmonary vascularity. Mild chronic bronchitic and interstitial changes grossly stable when accounting for differences in technique. No definite pulmonary infiltrate, pleural effusion or pneumothorax. Tiny calcified granuloma RIGHT upper lobe stable. Bones demineralized. 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 No acute abnormalities. Electronically Signed   By: Ulyses Southward M.D.   On: 01/12/2015 14:54   I have personally reviewed and evaluated these images and lab results as part of my medical decision-making.   EKG Interpretation None      MDM   Final diagnoses:  Hyperglycemia  Renal insufficiency  Altered mental status, unspecified altered mental status type    Patient with hyperglycemia. Also has increased confusion and is reportedly called the police because his bread was moldy. Mildly increased creatinine. Has hyperglycemia without DKA. Will admit to internal medicine.    Benjiman Core, MD 01/12/15 [phone]

## 2015-01-12 NOTE — ED Notes (Signed)
cbg in triage 433

## 2015-01-12 NOTE — ED Notes (Addendum)
Pt states that his glucose has been elevated today in the 400s.  Was "normal" yesterday.  Took 10 units of insulin.  Unclear if pt is on sliding scale.  Pt states that he just "takes what I need to" and that may be between 10-35 units.

## 2015-01-12 NOTE — ED Notes (Signed)
Sister is with pt. She reports that pt has been confused today. States he called the police to report moldy bread and also called her to tell her

## 2015-01-13 DIAGNOSIS — R739 Hyperglycemia, unspecified: Secondary | ICD-10-CM

## 2015-01-13 DIAGNOSIS — G9340 Encephalopathy, unspecified: Secondary | ICD-10-CM

## 2015-01-13 LAB — COMPREHENSIVE METABOLIC PANEL
ALBUMIN: 3.3 g/dL — AB (ref 3.5–5.0)
ALT: 14 U/L — AB (ref 17–63)
ANION GAP: 8 (ref 5–15)
AST: 18 U/L (ref 15–41)
Alkaline Phosphatase: 30 U/L — ABNORMAL LOW (ref 38–126)
BILIRUBIN TOTAL: 0.5 mg/dL (ref 0.3–1.2)
BUN: 23 mg/dL — AB (ref 6–20)
CALCIUM: 8.9 mg/dL (ref 8.9–10.3)
CO2: 23 mmol/L (ref 22–32)
CREATININE: 1.09 mg/dL (ref 0.61–1.24)
Chloride: 101 mmol/L (ref 101–111)
GFR calc Af Amer: >60 mL/min (ref >60)
GFR calc non Af Amer: >60 mL/min (ref >60)
GLUCOSE: 69 mg/dL (ref 65–99)
Potassium: 4.2 mmol/L (ref 3.5–5.1)
SODIUM: 132 mmol/L — AB (ref 135–145)
TOTAL PROTEIN: 6 g/dL — AB (ref 6.5–8.1)

## 2015-01-13 LAB — CBC
HCT: 31.3 % — ABNORMAL LOW (ref 39.0–52.0)
Hemoglobin: 10.8 g/dL — ABNORMAL LOW (ref 13.0–17.0)
MCH: 30.3 pg (ref 26.0–34.0)
MCHC: 34.5 g/dL (ref 30.0–36.0)
MCV: 87.9 fL (ref 78.0–100.0)
PLATELETS: 194 10*3/uL (ref 150–400)
RBC: 3.56 MIL/uL — ABNORMAL LOW (ref 4.22–5.81)
RDW: 13.2 % (ref 11.5–15.5)
WBC: 7.1 10*3/uL (ref 4.0–10.5)

## 2015-01-13 LAB — GLUCOSE, CAPILLARY
GLUCOSE-CAPILLARY: 249 mg/dL — AB (ref 65–99)
Glucose-Capillary: 90 mg/dL (ref 65–99)
Glucose-Capillary: 308 mg/dL — ABNORMAL HIGH (ref 65–99)
Glucose-Capillary: 206 mg/dL — ABNORMAL HIGH (ref 65–99)

## 2015-01-13 LAB — MRSA PCR SCREENING: MRSA by PCR: NEGATIVE

## 2015-01-13 NOTE — Progress Notes (Signed)
PROGRESS NOTE  Donald CancelWilliam L Everett ZOX:[id number] DOB: 04/16/1940 DOA: 01/12/2015 PCP: Syliva OvermanMargaret Simpson, MD  Summary: [age] yom with past medical history of HLD, DM type 2 and GERD presented to the ED by sister with complaints of acute encephalopathy onset 11/28. Per notes he called the sheriff dept upset over moldly bread and believing he was going to jail. While in the ED noted to still be confused with uncontrolled blood sugars. Admitted for further evaluation and treatment.   Assessment/Plan: 1. Acute encephalopathy, nearly resolved, likely secondary to hyperglycemia. No focal neuro deficits. CVA not suspected. CT Head and CXR unremarkable.  2. DM Type 2, uncontrolled with hyperglycemia, now improved.  Continue home medications and SSI.  3.  Modest hyponatremia, ddmission value corrected for hyperglycemia = 132.  4. Chronic Mobitz I, stable. 5. Normocytic anemia, Appear close to baseline, no evidence of bleeding. 6. GERD. Continue PPI 7. Anxiety, Xanax PRN.   Overall improved.   Plan transfer to medical floor  Follow CBG  Likely home 11/30  Consult diabetes coordinator for clarification on patient's insulin regimen.   Code Status: Full DVT prophylaxis: Lovenox Family Communication: Discussed with patient who understands and has no concerns at this time. Disposition Plan: Anticipate discharge within 24 hours.   Brendia Sacksaniel Goodrich, MD  [facility]  Pager [phone] If 7PM-7AM, please contact night-coverage at [email or web address], password [email or web address] 01/13/2015, 6:39 AM  LOS: 1 day   Consultants:    Procedures:    Antibiotics:    HPI/Subjective: Feels ok. No reports of chest pain, shortness of breath, n/v/d, or abd pain. Lives alone. Self care.   Objective: Filed Vitals:   01/13/15 0200 01/13/15 0300 01/13/15 0400 01/13/15 0500  BP: 108/56 125/63 104/48 105/44  Pulse: 66 67 67 64  Temp:   97 F (36.1 C)   TempSrc:   Oral   Resp: 20 16 10 14   Height:   5\' 9"  (1.753 m)     Weight:   68.3 kg (150 lb 9.2 oz)   SpO2: 95% 97% 97% 97%    Intake/Output Summary (Last 24 hours) at 01/13/15 0639 Last data filed at 01/13/15 0300  Gross per 24 hour  Intake    843 ml  Output    700 ml  Net    143 ml     Filed Weights   01/12/15 1216 01/12/15 2045 01/13/15 0400  Weight: 73.936 kg (163 lb) 68.3 kg (150 lb 9.2 oz) 68.3 kg (150 lb 9.2 oz)    Exam:  Afebrile, VSS, not hypoxic  General:  Appears calm and comfortable Eyes: PERRL, normal lids, irises & conjunctiva ENT: grossly normal hearing, lips & tongue Neck: no LAD, masses or thyromegaly Cardiovascular: RRR, no m/r/g. No LE edema. Telemetry: SR with Mobitz I Respiratory: CTA bilaterally, no w/r/r. Normal respiratory effort. Abdomen: soft, ntnd Skin: no rash or indurationnoted Musculoskeletal: grossly normal tone BUE/BLE Psychiatric: grossly normal mood and affect, speech fluent and appropriate. Oriented to self, location, month, year, president; does not recall yesterday's events Neurologic: grossly non-focal. CN appear intact. No pronator drift, no pass pointing.   New data reviewed:  Sodium sodium  BMP and CBC unremarkable  Hgb slightly lower, 10.8  WBC now WNL  CT Head: No acute infarct evident.  CXR independently reviewed: no acute abnormalities   UA unremarkable and EtOH negative  UOP 700  CBG stable  Scheduled Meds: . amLODipine  10 mg Oral Daily  . aspirin EC  325 mg Oral Daily  . B-complex with  vitamin C  1 tablet Oral Daily  . enoxaparin (LOVENOX) injection  40 mg Subcutaneous Q24H  . gabapentin  300 mg Oral BID   And  . gabapentin  600 mg Oral QHS  . insulin aspart  0-15 Units Subcutaneous TID WC  . insulin aspart  0-5 Units Subcutaneous QHS  . insulin glargine  7 Units Subcutaneous QHS  . linagliptin  5 mg Oral Daily  . losartan  100 mg Oral Daily  . mometasone-formoterol  2 puff Inhalation BID  . pantoprazole  40 mg Oral Daily  . pravastatin  20 mg Oral q morning - 10a   . sodium chloride  3 mL Intravenous Q12H  . tamsulosin  0.4 mg Oral Daily   Continuous Infusions: . sodium chloride 125 mL/hr at 01/13/15 0500    Principal Problem:   Acute encephalopathy Active Problems:   Diabetes mellitus, insulin dependent (IDDM), uncontrolled (HCC)   Hyponatremia   Mobitz type 1 second degree atrioventricular block   Hyperglycemia   Time spent 25 minutes   By signing my name below, I, Zadie Cleverly attest that this documentation has been prepared under the direction and in the presence of Brendia Sacks, MD Electronically signed: Zadie Cleverly  01/13/2015 9:12AM  I personally performed the services described in this documentation. All medical record entries made by the scribe were at my direction. I have reviewed the chart and agree that the record reflects my personal performance and is accurate and complete. Brendia Sacks, MD

## 2015-01-13 NOTE — Consult Note (Signed)
   [facility] LPHN CM Inpatient Consult   01/13/2015  Donald Everett 05/21/1940 [id number]   Spoke with patient at bedside regarding [facility]. Patient states he does not want to participate with [facility] at this time. Patient given a [facility] broch[facility], RNCM card with contact information for future reference.  Of note, [facility] services would not replace or interfere with any services that are arranged by inpatient case management or social work. For additional questions or referrals please contact:  Alben SpittleMary E. Albertha GheeNiemczura, RN, BSN, [facility]  [facility] Hospital Liaison [phone]

## 2015-01-13 NOTE — Progress Notes (Signed)
Inpatient Diabetes Program Recommendations  AACE/ADA: New Consensus Statement on Inpatient Glycemic Control (2015)  Target Ranges:  Prepandial:   less than 140 mg/dL      Peak postprandial:   less than 180 mg/dL (1-2 hours)      Critically ill patients:  140 - 180 mg/dL  Results for Donald Everett, Donald Everett (MRN [id number]) as of 01/13/2015 12:51  Ref. Range 01/12/2015 12:19 01/12/2015 17:22 01/12/2015 20:43 01/13/2015 07:49 01/13/2015 12:03  Glucose-Capillary Latest Ref Range: 65-99 mg/dL 528433 (H) 413191 (H) 244304 (H) 90 308 (H)   Review of Glycemic Control  Diabetes history: DM2 Outpatient Diabetes medications: Metformin 1000 mg BID, Januvia 100 mg daily at 12 pm (if needed, depending on glucose), 70/30 35 units in the morning (adjusts dose if needed based on glucose), rarely takes 70/30 in the evening (only takes if glucose is running high), Lantus 6 units QAM Current orders for Inpatient glycemic control: Lantus 7 units QHS, Novolog 0-15 units TID with meals, Novolog 0-5 units HS, Tradjenta 5 mg daily  Inpatient Diabetes Program Recommendations Correction (SSI): Based on fasting glucose of 90 mg/dl after receiving Lantus 7 units last night, would not recommend any changes with basal insulin at this time. Insulin - Meal Coverage: Please consider ordering Novolog 8 units TID with meals for meal coverage (starting now).  NOTE: Spoke with patient about diabetes and home regimen for diabetes control. Patient reports that he is followed by his PCP for diabetes management and currently he takes Metformin 1000 mg BID, Januvia 100 mg daily at 12 pm (if needed, depending on glucose), 70/30 35 units in the morning (adjusts dose if needed based on glucose), rarely takes 70/30 in the evening (only takes if glucose is running high), Lantus 6 units QAM as an outpatient for diabetes control. Patient reports that he always takes the same dose of Lantus which is 6 units in the morning but he adjusts the morning dose of  70/30 depending on his glucose. Patient reports that his fasting glucose is usually 100-140's mg/dl and he does not check his glucose again unless he feels "weak or like my sugar is low". Encouraged patient to check his glucose more often 2-3 times per day and explained how more glucose values will help his PCP make insulin adjustments.  Patient verbalized understanding of information discussed and she states that she has no further questions at this time related to diabetes.   [facility] and spoke with Harrold DonathNathan, [place] and he reports that patient is prescribed Metformin 1000 mg BID, Januvia 100mg  daily, 70/30 35 units BID (last filled 10/13/14), and Lantus 7 units QAM (last filled 05/26/14).   Thanks, Orlando PennerMarie Ritu Gagliardo, RN, MSN, CDE Diabetes Coordinator Inpatient Diabetes Program [phone] (Team Pager) [phone] (AP office) [phone] [facility] office) [phone] [facility] office)

## 2015-01-13 NOTE — Care Management Note (Signed)
Case Management Note  Patient Details  Name: Donald Everett MRN: [id number] Date of Birth: 04/10/1940  Subjective/Objective:                  Pt is from home, lives alone and is ind with ADL's. Pt has no HH services or DME prior to admission.   Action/Plan: Pt plans [facility]. [facility]. Alroy BailiffLinda Lothian, of [facility], made aware of referral and will obtain pt info from chart. Pt aware HH has 48 hours to initiate services. Will cont to follow for DC planning.   Expected Discharge Date:    01/13/2015              Expected Discharge Plan:  Home w Home Health Services  In-House Referral:  NA  Discharge planning Services  CM Consult  Post Acute Care Choice:  Home Health Choice offered to:  Patient  DME Arranged:    DME Agency:     HH Arranged:  RN HH Agency:  [facility]  Status of Service:  In process, will continue to follow  Medicare Important Message Given:    Date Medicare IM Given:    Medicare IM give by:    Date Additional Medicare IM Given:    Additional Medicare Important Message give by:     If discussed at Long Length of Stay Meetings, dates discussed:    Additional Comments:  Malcolm MetroChildress, [place] Demske, RN 01/13/2015, 2:51 PM

## 2015-01-13 NOTE — Progress Notes (Signed)
Patient changed to med-surg and transferred to 2A room 203 and left in care of staff which I did give report to RN there

## 2015-01-14 DIAGNOSIS — E109 Type 1 diabetes mellitus without complications: Secondary | ICD-10-CM

## 2015-01-14 DIAGNOSIS — R739 Hyperglycemia, unspecified: Secondary | ICD-10-CM

## 2015-01-14 DIAGNOSIS — E871 Hypo-osmolality and hyponatremia: Secondary | ICD-10-CM

## 2015-01-14 DIAGNOSIS — I441 Atrioventricular block, second degree: Secondary | ICD-10-CM

## 2015-01-14 DIAGNOSIS — G9340 Encephalopathy, unspecified: Secondary | ICD-10-CM

## 2015-01-14 LAB — BASIC METABOLIC PANEL
Anion gap: 4 — ABNORMAL LOW (ref 5–15)
BUN: 15 mg/dL (ref 6–20)
CHLORIDE: 105 mmol/L (ref 101–111)
CO2: 23 mmol/L (ref 22–32)
CREATININE: 0.93 mg/dL (ref 0.61–1.24)
Calcium: 8.5 mg/dL — ABNORMAL LOW (ref 8.9–10.3)
GFR calc Af Amer: >60 mL/min (ref >60)
GLUCOSE: 134 mg/dL — AB (ref 65–99)
POTASSIUM: 4.2 mmol/L (ref 3.5–5.1)
Sodium: 132 mmol/L — ABNORMAL LOW (ref 135–145)

## 2015-01-14 LAB — GLUCOSE, CAPILLARY: Glucose-Capillary: 256 mg/dL — ABNORMAL HIGH (ref 65–99)

## 2015-01-14 MED ORDER — INSULIN ASPART PROT & ASPART (70-30 MIX) 100 UNIT/ML ~~LOC~~ SUSP: 10.0000 [IU] | Freq: Two times a day (BID) | SUBCUTANEOUS | Status: DC

## 2015-01-14 NOTE — Progress Notes (Signed)
Donald CancelWilliam L Sharpley discharged Home per MD order.  Discharge instructions reviewed and discussed with the patient, all questions and concerns answered. Copy of instructions given to patient.    Medication List    STOP taking these medications        Insulin Glargine 100 UNIT/ML Solostar Pen  Commonly known as:  LANTUS      TAKE these medications        Alcohol Swabs Pads  For use with blood sugar checks     ALPRAZolam 0.5 MG tablet  Commonly known as:  XANAX  TAKE ONE TABLET BY MOUTH AT BEDTIME. MUST LAST 90 DAYS.     amLODipine 10 MG tablet  Commonly known as:  NORVASC  Take 1 tablet (10 mg total) by mouth daily.     aspirin 325 MG EC tablet  Take 1 tablet (325 mg total) by mouth daily.     B-complex with vitamin C tablet  Take 1 tablet by mouth daily.     B-D ULTRAFINE III SHORT PEN 31G X 8 MM Misc  Generic drug:  Insulin Pen Needle  USE AS DIRECTED.     fluticasone 50 MCG/ACT nasal spray  Commonly known as:  FLONASE  Place 1 spray into both nostrils daily.     Fluticasone-Salmeterol 100-50 MCG/DOSE Aepb  Commonly known as:  ADVAIR DISKUS  Inhale 1 puff into the lungs 2 (two) times daily.     gabapentin 300 MG capsule  Commonly known as:  NEURONTIN  One cap twice daily and 2 at bedtime     HYDROcodone-acetaminophen 7.5-325 MG tablet  Commonly known as:  NORCO  Take 1 tablet by mouth 2 (two) times daily as needed.     insulin aspart protamine- aspart (70-30) 100 UNIT/ML injection  Commonly known as:  NOVOLOG MIX 70/30  Inject 0.1 mLs (10 Units total) into the skin 2 (two) times daily with a meal.     JANUVIA 100 MG tablet  Generic drug:  sitaGLIPtin  TAKE ONE TABLET BY MOUTH DAILY.     losartan 100 MG tablet  Commonly known as:  COZAAR  TAKE ONE TABLET BY MOUTH DAILY.     metFORMIN 1000 MG tablet  Commonly known as:  GLUCOPHAGE  TAKE ONE TABLET BY MOUTH TWICE DAILY WITH A MEAL.     ONE TOUCH ULTRA TEST test strip  Generic drug:  glucose blood  USE TO  TEST THREE TIMES DAILY.     pantoprazole 40 MG tablet  Commonly known as:  PROTONIX  TAKE ONE TABLET BY MOUTH DAILY.     polyethylene glycol packet  Commonly known as:  MIRALAX / GLYCOLAX  Take 17 g by mouth daily as needed for mild constipation. Mix with 8 oz of water.     pravastatin 20 MG tablet  Commonly known as:  PRAVACHOL  TAKE ONE TABLET BY MOUTH EVERY MORNING.     spironolactone 50 MG tablet  Commonly known as:  ALDACTONE  TAKE ONE TABLET BY MOUTH TWICE DAILY.     tamsulosin 0.4 MG Caps capsule  Commonly known as:  FLOMAX  TAKE ONE CAPSULE BY MOUTH DAILY.     temazepam 15 MG capsule  Commonly known as:  RESTORIL  TAKE ONE CAPSULE BY MOUTH AT BEDTIME AS NEEDED FOR SLEEP.        Patients skin is clean, dry and intact, no evidence of skin break down. IV site discontinued and catheter remains intact. Site without signs and symptoms of complications. Dressing  and pressure applied.  Patient escorted to car in a wheelchair,  no distress noted upon discharge.  Hoover Brunette 01/14/2015 4:25 PM

## 2015-01-14 NOTE — Care Management Note (Signed)
Case Management Note  Patient Details  Name: Donald Everett MRN: [id number] Date of Birth: 08/26/1940  Pt discharging home today with self care. Pt has refused HH due to co-pay. Pt says he does not feel he needs it anyway. No further CM needs have been identified at this time.  Expected Discharge Date:       01/14/2015           Expected Discharge Plan:  Home/Self Care  In-House Referral:  NA  Discharge planning Services  CM Consult  Post Acute Care Choice:  NA Choice offered to:  NA  DME Arranged:    DME Agency:     HH Arranged:    HH Agency:     Status of Service:  Completed, signed off  Medicare Important Message Given:  N/A - LOS <3 / Initial given by admissions Date Medicare IM Given:    Medicare IM give by:    Date Additional Medicare IM Given:    Additional Medicare Important Message give by:     If discussed at Long Length of Stay Meetings, dates discussed:    Additional Comments:  Malcolm MetroChildress, Brydan Downard Demske, RN 01/14/2015, 3:57 PM

## 2015-01-14 NOTE — Progress Notes (Signed)
Inpatient Diabetes Program Recommendations  AACE/ADA: New Consensus Statement on Inpatient Glycemic Control (2015)  Target Ranges:  Prepandial:   less than 140 mg/dL      Peak postprandial:   less than 180 mg/dL (1-2 hours)      Critically ill patients:  140 - 180 mg/dL    Results for Paralee CancelBARKER, Culver L (MRN [id number]) as of 01/14/2015 08:36  Ref. Range 01/13/2015 07:49 01/13/2015 12:03 01/13/2015 17:03 01/13/2015 21:14  Glucose-Capillary Latest Ref Range: 65-99 mg/dL 90 284308 (H) 132249 (H) 440206 (H)    Home DM Meds: Metformin 1000 mg BID       Januvia 100 mg daily at 12 pm (if needed, depending on glucose)       70/30 insulin 35 units in the morning (adjusts dose if needed based on glucose), rarely takes 70/30 in the evening (only takes if glucose is running high)       Lantus 6 units QAM  Current Insulin Orders: Lantus 7 units QHS      Novolog Moderate SSI (0-15 units) TID AC + HS      Tradjenta 5 mg daily    -Note DM Coordinator spoke with patient yesterday by phone.  Per DM Coordinator, patient likely will do better at home with simpler insulin regimen.  Patient only checking CBGs QAM at home.  Was encouraged to check CBGs more often by DM Coordinator, however, I am unsure if patient will adhere to this CBG schedule.    -Currently, patient does take Lantus and 70/30 insulin at home, however, would likely do better if we manage him with his oral DM medications and 70/30 insulin bidwc.  -Fasting glucose looks quite well controlled on current dose of Lantus 7 units QHS so patient not requiring large doses of basal insulin but does need insulin throughout the day to cover his food intake as well.    Recommend the following insulin adjustments:  1. Stop Lantus 7 units QHS- Also consider discontinuing Lantus from home insulin regimen  2. Start 70/30 insulin- 10 units bidwc (breakfast and supper)  3. Continue Tradjenta 5 mg daily (Januvia at time of discharge)     --Will follow  patient during hospitalization--  Ambrose FinlandJeannine Johnston Kalii Chesmore RN, MSN, CDE Diabetes Coordinator Inpatient Glycemic Control Team Team Pager: [phone] (8a-5p)

## 2015-01-14 NOTE — Discharge Summary (Signed)
Physician Discharge Summary  Donald Everett:[id number] DOB: 06/10/1940 DOA: 01/12/2015  PCP: Syliva Overman, MD  Admit date: 01/12/2015 Discharge date: 01/14/2015  Time spent: 35 minutes  Recommendations for Outpatient Follow-up:  1. Follow up with PCP in 1-2 weeks.   Discharge Diagnoses:  Principal Problem:   Acute encephalopathy Active Problems:   Diabetes mellitus, insulin dependent (IDDM), uncontrolled (HCC)   Hyponatremia   Mobitz type 1 second degree atrioventricular block   Hyperglycemia   Discharge Condition: Improved  Diet recommendation: Heart Healthy  Filed Weights   01/12/15 1216 01/12/15 2045 01/13/15 0400  Weight: 73.936 kg (163 lb) 68.3 kg (150 lb 9.2 oz) 68.3 kg (150 lb 9.2 oz)    History of present illness:  74 y/o male with a hx of HLD, DM type 2 and GERD presented to the ED  with complaints of acute encephalopathy onset 11/28. Per notes he called the sheriff dept upset over moldly bread and believing he was going to jail. While in the ED noted to still be confused with uncontrolled blood sugars. Admitted for further evaluation and treatment.   Hospital Course:  Patient presented with acute encephalopathy secondary to hyperglycemia/dehydration. He was started on aggressive IVF and SSI with improvement. He had no focal neurological deficits and his head CT and CXR were unremarkable. His encephalopathy has resolved with improvement in his blood sugar and volume status.  Blood sugars are currently stable with fasting sugars being occasionally low. Per diabetes co ordinator recommendations, we will continue his oral diabetes medications, discontinue lantus and start on 70/30 insulin 10 units bid. This can be further titrated as an outpatient.   1. Psuedohyponatremia on admission. Value corrected for hyperglycemia was 132.   2. Chronic Mobitz I, stable. 3. Normocytic anemia, Appear close to baseline, no evidence of bleeding. 4. GERD. Continue  PPI 5. Anxiety. Continue Xanax PRN.   Procedures:  None   Consultations:  none  Discharge Exam: Filed Vitals:   01/13/15 2126 01/14/15 0522  BP: 131/61 139/63  Pulse: 63 60  Temp: 98.2 F (36.8 C) 98.1 F (36.7 C)  Resp: 18 18     General: NAD, looks comfortable  Cardiovascular: RRR, S1, S2   Respiratory: clear bilaterally, No wheezing, rales or rhonchi  Abdomen: soft, non tender, no distention , bowel sounds normal  Musculoskeletal: No edema b/l  Discharge Instructions   Discharge Instructions    Diet - low sodium heart healthy    Complete by:  As directed      Increase activity slowly    Complete by:  As directed           Current Discharge Medication List    CONTINUE these medications which have CHANGED   Details  insulin aspart protamine- aspart (NOVOLOG MIX 70/30) (70-30) 100 UNIT/ML injection Inject 0.1 mLs (10 Units total) into the skin 2 (two) times daily with a meal. Qty: 30 mL, Refills: 0   Associated Diagnoses: Diabetes mellitus, insulin dependent (IDDM), uncontrolled (HCC)      CONTINUE these medications which have NOT CHANGED   Details  ALPRAZolam (XANAX) 0.5 MG tablet TAKE ONE TABLET BY MOUTH AT BEDTIME. MUST LAST 90 DAYS. Qty: 90 tablet, Refills: 0    amLODipine (NORVASC) 10 MG tablet Take 1 tablet (10 mg total) by mouth daily. Qty: 90 tablet, Refills: 1    aspirin EC 325 MG EC tablet Take 1 tablet (325 mg total) by mouth daily. Qty: 30 tablet, Refills: 0    B Complex-C (  B-COMPLEX WITH VITAMIN C) tablet Take 1 tablet by mouth daily.    Fluticasone-Salmeterol (ADVAIR DISKUS) 100-50 MCG/DOSE AEPB Inhale 1 puff into the lungs 2 (two) times daily. Qty: 180 each, Refills: 0    gabapentin (NEURONTIN) 300 MG capsule One cap twice daily and 2 at bedtime Qty: 360 capsule, Refills: 1    HYDROcodone-acetaminophen (NORCO) 7.5-325 MG tablet Take 1 tablet by mouth 2 (two) times daily as needed. Qty: 60 tablet, Refills: 0    JANUVIA 100  MG tablet TAKE ONE TABLET BY MOUTH DAILY. Qty: 90 tablet, Refills: 1    losartan (COZAAR) 100 MG tablet TAKE ONE TABLET BY MOUTH DAILY. Qty: 90 tablet, Refills: 1    metFORMIN (GLUCOPHAGE) 1000 MG tablet TAKE ONE TABLET BY MOUTH TWICE DAILY WITH A MEAL. Qty: 180 tablet, Refills: 1    pantoprazole (PROTONIX) 40 MG tablet TAKE ONE TABLET BY MOUTH DAILY. Qty: 90 tablet, Refills: 1    polyethylene glycol (MIRALAX / GLYCOLAX) packet Take 17 g by mouth daily as needed for mild constipation. Mix with 8 oz of water.    pravastatin (PRAVACHOL) 20 MG tablet TAKE ONE TABLET BY MOUTH EVERY MORNING. Qty: 90 tablet, Refills: 0    spironolactone (ALDACTONE) 50 MG tablet TAKE ONE TABLET BY MOUTH TWICE DAILY. Qty: 180 tablet, Refills: 1    tamsulosin (FLOMAX) 0.4 MG CAPS capsule TAKE ONE CAPSULE BY MOUTH DAILY. Qty: 90 capsule, Refills: 0    temazepam (RESTORIL) 15 MG capsule TAKE ONE CAPSULE BY MOUTH AT BEDTIME AS NEEDED FOR SLEEP. Qty: 90 capsule, Refills: 0    Alcohol Swabs PADS For use with blood sugar checks Qty: 100 each, Refills: 3    B-D ULTRAFINE III SHORT PEN 31G X 8 MM MISC USE AS DIRECTED. Qty: 100 each, Refills: 0    fluticasone (FLONASE) 50 MCG/ACT nasal spray Place 1 spray into both nostrils daily. Qty: 15 g, Refills: 2    ONE TOUCH ULTRA TEST test strip USE TO TEST THREE TIMES DAILY. Qty: 100 each, Refills: 3      STOP taking these medications     Insulin Glargine (LANTUS) 100 UNIT/ML Solostar Pen        No Known Allergies    The results of significant diagnostics from this hospitalization (including imaging, microbiology, ancillary and laboratory) are listed below for reference.    Significant Diagnostic Studies: Dg Chest 2 View  01/12/2015  CLINICAL DATA:  Cough, shortness of breath, smoker, hypertension, diabetes mellitus EXAM: CHEST  2 VIEW COMPARISON:  02/19/2013 FINDINGS: Normal heart size, mediastinal contours, and pulmonary vascularity. Mild chronic  bronchitic and interstitial changes grossly stable when accounting for differences in technique. No definite pulmonary infiltrate, pleural effusion or pneumothorax. Tiny calcified granuloma RIGHT upper lobe stable. Bones demineralized. 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 No acute abnormalities. Electronically Signed   By: Ulyses SouthwardMark  Boles M.D.   On: 01/12/2015 14:54   Ct Head Wo Contrast  01/12/2015  CLINICAL DATA:  Confusion/altered mental status ; hyperglycemia EXAM: CT HEAD WITHOUT CONTRAST TECHNIQUE: Contiguous axial images were obtained from the base of the skull through the vertex without intravenous contrast. COMPARISON:  February 07, 2012 FINDINGS: There is mild diffuse atrophy. There is no intracranial mass, hemorrhage, extra-axial fluid collection, or midline shift. There is patchy small vessel disease in the centra semiovale bilaterally. Elsewhere the gray-white compartments appear normal. No acute infarct evident. The bony calvarium appears intact. The mastoid air cells are clear. IMPRESSION: Atrophy with periventricular small  vessel disease, stable. No acute infarct evident. No hemorrhage or mass effect. Electronically Signed   By: Bretta Bang III M.D.   On: 01/12/2015 15:45    Microbiology: Recent Results (from the past 240 hour(s))  MRSA PCR Screening     Status: None   Collection Time: 01/12/15 10:20 PM  Result Value Ref Range Status   MRSA by PCR NEGATIVE NEGATIVE Final    Comment:        The GeneXpert MRSA Assay (FDA approved for NASAL specimens only), is one component of a comprehensive MRSA colonization surveillance program. It is not intended to diagnose MRSA infection nor to guide or monitor treatment for MRSA infections.      Labs: Basic Metabolic Panel:  Recent Labs Lab 01/12/15 1231 01/13/15 0425 01/14/15 0529  NA 122* 132* 132*  K 5.0 4.2 4.2  CL 90* 101 105  CO2 GLUCOSE 402* 69 134*  BUN 27* 23* 15   CREATININE 1.46* 1.09 0.93  CALCIUM 9.7 8.9 8.5*   Liver Function Tests:  Recent Labs Lab 01/13/15 0425  AST 18  ALT 14*  ALKPHOS 30*  BILITOT 0.5  PROT 6.0*  ALBUMIN 3.3*   CBC:  Recent Labs Lab 01/12/15 1231 01/13/15 0425  WBC 10.7* 7.1  HGB 12.8* 10.8*  HCT 35.8* 31.3*  MCV 88.6 87.9  PLT 231 194     CBG:  Recent Labs Lab 01/13/15 0749 01/13/15 1203 01/13/15 1703 01/13/15 2114 01/14/15 1141  GLUCAP 90 308* 249* 206* 256*     Signed:  Demitrious Mccannon. MD [facility] 01/14/2015, 3:06 PM   By signing my name below, I, Burnett Harry, attest that this documentation has been prepared under the direction and in the presence of [facility]. MD Electronically Signed: Burnett Harry, Scribe. 01/14/2015 2:21pm  I, Dr. Erick Blinks, personally performed the services described in this documentaiton. All medical record entries made by the scribe were at my direction and in my presence. I have reviewed the chart and agree that the record reflects my personal performance and is accurate and complete  Erick Blinks, MD, 01/14/2015 3:06 PM

## 2015-01-14 NOTE — Care Management Important Message (Signed)
Important Message  Patient Details  Name: Donald Everett MRN: [id number] Date of Birth: 09/03/1940   Medicare Important Message Given:  N/A - LOS <3 / Initial given by admissions    Malcolm Metrohildress, Lang Zingg Demske, RN 01/14/2015, 3:56 PM

## 2015-01-22 ENCOUNTER — Ambulatory Visit (INDEPENDENT_AMBULATORY_CARE_PROVIDER_SITE_OTHER): Payer: PPO | Admitting: Family Medicine

## 2015-01-22 ENCOUNTER — Encounter: Payer: Self-pay | Admitting: Family Medicine

## 2015-01-22 VITALS — BP 138/70 | HR 90 | Resp 18 | Ht 68.0 in | Wt 152.1 lb

## 2015-01-22 DIAGNOSIS — N183 Chronic kidney disease, stage 3 unspecified: Secondary | ICD-10-CM

## 2015-01-22 DIAGNOSIS — Z09 Encounter for follow-up examination after completed treatment for conditions other than malignant neoplasm: Secondary | ICD-10-CM | POA: Diagnosis not present

## 2015-01-22 DIAGNOSIS — I150 Renovascular hypertension: Secondary | ICD-10-CM

## 2015-01-22 DIAGNOSIS — N189 Chronic kidney disease, unspecified: Secondary | ICD-10-CM

## 2015-01-22 DIAGNOSIS — E871 Hypo-osmolality and hyponatremia: Secondary | ICD-10-CM

## 2015-01-22 DIAGNOSIS — F17200 Nicotine dependence, unspecified, uncomplicated: Secondary | ICD-10-CM

## 2015-01-22 DIAGNOSIS — E1065 Type 1 diabetes mellitus with hyperglycemia: Secondary | ICD-10-CM

## 2015-01-22 DIAGNOSIS — D519 Vitamin B12 deficiency anemia, unspecified: Secondary | ICD-10-CM | POA: Diagnosis not present

## 2015-01-22 DIAGNOSIS — G9340 Encephalopathy, unspecified: Secondary | ICD-10-CM

## 2015-01-22 DIAGNOSIS — E1022 Type 1 diabetes mellitus with diabetic chronic kidney disease: Secondary | ICD-10-CM

## 2015-01-22 MED ORDER — CYANOCOBALAMIN 1000 MCG/ML IJ SOLN
1000.0000 ug | Freq: Once | INTRAMUSCULAR | Status: AC
  Administered 2015-01-22: 1000 ug via INTRAMUSCULAR

## 2015-01-22 NOTE — Progress Notes (Signed)
Subjective:    Patient ID: Donald Everett, male    DOB: 10/23/1940, 74 y.o.   MRN: [id number]  HPI Pt in for f/u recent hospitalization for acute encephalopathy due to hyponatremia. He has clear recall of the events preceding his admission, even the fact that he called the sherrif with concern of moulded bread on his porch Though his blood sugar was elevated at admission , this cannot account for his acute confusion, instead his severe hyponatremia , which has been a chronic problem , I believe , is the culprit. He is on no meds which will lower his sodium currently If rept lab shows a sodium below 130, he will need renal eval, he understands this, though not anxious to have to incur another medical bill  Reports good blood suagr levels since being at home and taking medication s prescribed, the morning of his admission reports taking less than prescribed dose, FBG  Now is seldom over 140   Review of Systems    See HPI Denies recent fever or chills. Denies sinus pressure, nasal congestion, ear pain or sore throat. Denies chest congestion, productive cough or wheezing. Denies chest pains, palpitations and leg swelling Denies abdominal pain, nausea, vomiting,diarrhea or constipation.   Denies dysuria, frequency, hesitancy or incontinence. Denies uncontrolled  joint pain, swelling and limitation in mobility. Denies headaches, seizures, numbness, or tingling. Denies depression, anxiety or insomnia. Denies skin break down or rash.     Objective:   Physical Exam  BP 138/70 mmHg  Pulse 90  Resp 18  Ht 5\' 8"  (1.727 m)  Wt 152 lb 1.9 oz (69.001 kg)  BMI 23.14 kg/m2  SpO2 95% Patient alert and oriented and in no cardiopulmonary distress.  HEENT: No facial asymmetry, EOMI,   oropharynx pink and moist.  Neck supple no JVD, no mass.  Chest: Clear to auscultation bilaterally.  CVS: S1, S2 no murmurs, no S3.Regular rate.  ABD: Soft non tender.   Ext: No edema  MS: Adequate ROM  spine, shoulders, hips and knees.  Skin: Intact, no ulcerations or rash noted.  Psych: Good eye contact, normal affect. Memory intact not anxious or depressed appearing.  CNS: CN 2-12 intact, power,  normal throughout.no focal deficits noted.       Assessment & Plan:  Hospital discharge follow-up Recurrent and chronic hyponatremia resuloing in acute encephalopathy and 2 day admission, currently stable , but needs nephrology eval to address hyponatremia  NICOTINE ADDICTION Patient counseled for approximately 5 minutes regarding the health risks of ongoing nicotine use, specifically all types of cancer, heart disease, stroke and respiratory failure. The options available for help with cessation ,the behavioral changes to assist the process, and the option to either gradully reduce usage  Or abruptly stop.is also discussed. Pt is also encouraged to set specific goals in number of cigarettes used daily, as well as to set a quit date.  Number of cigarettes/cigars currently smoking daily: 15 to 20   Renovascular hypertension Controlled, no change in medication DASH diet and commitment to daily physical activity for a minimum of 30 minutes discussed and encouraged, as a part of hypertension management. The importance of attaining a healthy weight is also discussed.  BP/Weight 01/22/2015 01/14/2015 01/13/2015 01/12/2015 12/16/2014 11/18/2014 09/29/2014  Systolic BP 138 151 - - 138 - -  Diastolic BP 70 60 - - 62 - -  Wt. (Lbs) 152.12 - 150.57 - 162 165 163  BMI 23.14 - - 22.23 24.64 24.36 24.06  Diabetes mellitus, insulin dependent (IDDM), uncontrolled [facility]) Donald Everett is reminded of the importance of commitment to daily physical activity for 30 minutes or more, as able and the need to limit carbohydrate intake to 30 to 60 grams per meal to help with blood sugar control.   The need to take medication as prescribed, test blood sugar as directed, and to call between visits if there  is a concern that blood sugar is uncontrolled is also discussed.   Donald Everett is reminded of the importance of daily foot exam, annual eye examination, and good blood sugar, blood pressure and cholesterol control. improved blood sugar, no change in medication  Diabetic Labs Latest Ref Rng 01/22/2015 01/14/2015 01/13/2015 01/12/2015 12/08/2014  HbA1c <5.7 % - - - - 7.8(H)  Microalbumin <2.0 mg/dL - - - - -  Micro/Creat Ratio 0.0 - 30.0 mg/g - - - - -  Chol 0 - 200 mg/dL - - - - -  HDL >=16 mg/dL - - - - -  Calc LDL 0 - 99 mg/dL - - - - -  Triglycerides <150 mg/dL - - - - -  Creatinine 1.09 - 1.18 mg/dL 6.04(V) 4.09 8.11 9.14(N) 1.33(H)   BP/Weight 01/22/2015 01/14/2015 01/13/2015 01/12/2015 12/16/2014 11/18/2014 09/29/2014  Systolic BP 138 151 - - 138 - -  Diastolic BP 70 60 - - 62 - -  Wt. (Lbs) 152.12 - 150.57 - 162 165 163  BMI 23.14 - - 22.23 24.64 24.36 24.06   Foot/eye exam completion dates 12/16/2014 01/22/2014  Foot Form Completion Done Done         Acute encephalopathy Resolved, attributed to hyponatremia, will refer to nephrology  Hyponatremia Chronically low sodium, at presentation to hospital, level was at its lowest 122, and the most likely cause of his acute neurologic decompensation  B12 deficiency anemia B12 administered IM at visit , currently on monthly schedule

## 2015-01-22 NOTE — Patient Instructions (Signed)
F/u as before, call if you need me sooner  Chem 7 and EGFR today. If sodium is low , you will need to see a kidney specialist, as we discussed  Low sodium was the cause of your confusion, which caused you to need to be hospitalized.  I advise cmall amounts of additional salt in food  Microalb today if possible  Pls sched and keep eye appt  All the best for 20-17!  Thankful that you are doing better!

## 2015-01-23 DIAGNOSIS — N183 Chronic kidney disease, stage 3 unspecified: Secondary | ICD-10-CM | POA: Insufficient documentation

## 2015-01-23 LAB — BASIC METABOLIC PANEL WITH GFR
BUN: 19 mg/dL (ref 7–25)
CHLORIDE: 94 mmol/L — AB (ref 98–110)
CO2: 23 mmol/L (ref 20–31)
CREATININE: 1.29 mg/dL — AB (ref 0.70–1.18)
Calcium: 9.6 mg/dL (ref 8.6–10.3)
GFR, Est African American: 63 mL/min (ref >=60)
GFR, Est Non African American: 54 mL/min — ABNORMAL LOW (ref >=60)
GLUCOSE: 197 mg/dL — AB (ref 65–99)
POTASSIUM: 4.9 mmol/L (ref 3.5–5.3)
Sodium: 128 mmol/L — ABNORMAL LOW (ref 135–146)

## 2015-01-26 DIAGNOSIS — Z09 Encounter for follow-up examination after completed treatment for conditions other than malignant neoplasm: Secondary | ICD-10-CM | POA: Insufficient documentation

## 2015-01-26 NOTE — Assessment & Plan Note (Signed)
B12 administered IM at visit , currently on monthly schedule

## 2015-01-26 NOTE — Assessment & Plan Note (Signed)
Patient counseled for approximately 5 minutes regarding the health risks of ongoing nicotine use, specifically all types of cancer, heart disease, stroke and respiratory failure. The options available for help with cessation ,the behavioral changes to assist the process, and the option to either gradully reduce usage  Or abruptly stop.is also discussed. Pt is also encouraged to set specific goals in number of cigarettes used daily, as well as to set a quit date.  Number of cigarettes/cigars currently smoking daily: 15 to 20  

## 2015-01-26 NOTE — Assessment & Plan Note (Signed)
Controlled, no change in medication DASH diet and commitment to daily physical activity for a minimum of 30 minutes discussed and encouraged, as a part of hypertension management. The importance of attaining a healthy weight is also discussed.  BP/Weight 01/22/2015 01/14/2015 01/13/2015 01/12/2015 12/16/2014 11/18/2014 09/29/2014  Systolic BP 138 151 - - 138 - -  Diastolic BP 70 60 - - 62 - -  Wt. (Lbs) 152.12 - 150.57 - 162 165 163  BMI 23.14 - - 22.23 24.64 24.36 24.06

## 2015-01-26 NOTE — Assessment & Plan Note (Signed)
Chronically low sodium, at presentation to hospital, level was at its lowest 122, and the most likely cause of his acute neurologic decompensation

## 2015-01-26 NOTE — Assessment & Plan Note (Signed)
Recurrent and chronic hyponatremia resuloing in acute encephalopathy and 2 day admission, currently stable , but needs nephrology eval to address hyponatremia

## 2015-01-26 NOTE — Assessment & Plan Note (Signed)
Resolved, attributed to hyponatremia, will refer to nephrology

## 2015-01-26 NOTE — Assessment & Plan Note (Signed)
Donald Everett is reminded of the importance of commitment to daily physical activity for 30 minutes or more, as able and the need to limit carbohydrate intake to 30 to 60 grams per meal to help with blood sugar control.   The need to take medication as prescribed, test blood sugar as directed, and to call between visits if there is a concern that blood sugar is uncontrolled is also discussed.   Donald Everett is reminded of the importance of daily foot exam, annual eye examination, and good blood sugar, blood pressure and cholesterol control. improved blood sugar, no change in medication  Diabetic Labs Latest Ref Rng 01/22/2015 01/14/2015 01/13/2015 01/12/2015 12/08/2014  HbA1c <5.7 % - - - - 7.8(H)  Microalbumin <2.0 mg/dL - - - - -  Micro/Creat Ratio 0.0 - 30.0 mg/g - - - - -  Chol 0 - 200 mg/dL - - - - -  HDL >=96>=40 mg/dL - - - - -  Calc LDL 0 - 99 mg/dL - - - - -  Triglycerides <150 mg/dL - - - - -  Creatinine 0.450.70 - 1.18 mg/dL 4.09(W1.29(H) 1.190.93 1.471.09 8.29(F1.46(H) 1.33(H)   BP/Weight 01/22/2015 01/14/2015 01/13/2015 01/12/2015 12/16/2014 11/18/2014 09/29/2014  Systolic BP 138 151 - - 138 - -  Diastolic BP 70 60 - - 62 - -  Wt. (Lbs) 152.12 - 150.57 - 162 165 163  BMI 23.14 - - 22.23 24.64 24.36 24.06   Foot/eye exam completion dates 12/16/2014 01/22/2014  Foot Form Completion Done Done

## 2015-02-20 ENCOUNTER — Other Ambulatory Visit: Payer: Self-pay

## 2015-02-20 MED ORDER — HYDROCODONE-ACETAMINOPHEN 7.5-325 MG PO TABS: 1.0000 | ORAL_TABLET | Freq: Two times a day (BID) | ORAL | Status: DC | PRN

## 2015-02-26 ENCOUNTER — Ambulatory Visit (INDEPENDENT_AMBULATORY_CARE_PROVIDER_SITE_OTHER): Payer: PPO

## 2015-02-26 DIAGNOSIS — D519 Vitamin B12 deficiency anemia, unspecified: Secondary | ICD-10-CM | POA: Diagnosis not present

## 2015-02-26 MED ORDER — CYANOCOBALAMIN 1000 MCG/ML IJ SOLN
1000.0000 ug | Freq: Once | INTRAMUSCULAR | Status: AC
  Administered 2015-02-26: 1000 ug via INTRAMUSCULAR

## 2015-03-04 ENCOUNTER — Other Ambulatory Visit: Payer: Self-pay | Admitting: Family Medicine

## 2015-03-06 ENCOUNTER — Other Ambulatory Visit: Payer: Self-pay | Admitting: Family Medicine

## 2015-03-16 DIAGNOSIS — E1129 Type 2 diabetes mellitus with other diabetic kidney complication: Secondary | ICD-10-CM | POA: Diagnosis not present

## 2015-03-16 DIAGNOSIS — N189 Chronic kidney disease, unspecified: Secondary | ICD-10-CM | POA: Diagnosis not present

## 2015-03-16 DIAGNOSIS — I701 Atherosclerosis of renal artery: Secondary | ICD-10-CM | POA: Diagnosis not present

## 2015-03-16 DIAGNOSIS — I739 Peripheral vascular disease, unspecified: Secondary | ICD-10-CM | POA: Diagnosis not present

## 2015-03-19 ENCOUNTER — Other Ambulatory Visit: Payer: Self-pay

## 2015-03-19 MED ORDER — HYDROCODONE-ACETAMINOPHEN 7.5-325 MG PO TABS: 1.0000 | ORAL_TABLET | Freq: Two times a day (BID) | ORAL | Status: DC | PRN

## 2015-03-31 DIAGNOSIS — Z125 Encounter for screening for malignant neoplasm of prostate: Secondary | ICD-10-CM | POA: Diagnosis not present

## 2015-03-31 DIAGNOSIS — N529 Male erectile dysfunction, unspecified: Secondary | ICD-10-CM | POA: Diagnosis not present

## 2015-03-31 DIAGNOSIS — E1159 Type 2 diabetes mellitus with other circulatory complications: Secondary | ICD-10-CM | POA: Diagnosis not present

## 2015-03-31 DIAGNOSIS — N521 Erectile dysfunction due to diseases classified elsewhere: Secondary | ICD-10-CM | POA: Diagnosis not present

## 2015-03-31 DIAGNOSIS — E1165 Type 2 diabetes mellitus with hyperglycemia: Secondary | ICD-10-CM | POA: Diagnosis not present

## 2015-03-31 LAB — CBC WITH DIFFERENTIAL/PLATELET
BASOS ABS: 0 10*3/uL (ref 0.0–0.1)
BASOS PCT: 0 % (ref 0–1)
EOS PCT: 4 % (ref 0–5)
Eosinophils Absolute: 0.3 10*3/uL (ref 0.0–0.7)
HEMATOCRIT: 37.6 % — AB (ref 39.0–52.0)
Hemoglobin: 12.4 g/dL — ABNORMAL LOW (ref 13.0–17.0)
LYMPHS PCT: 18 % (ref 12–46)
Lymphs Abs: 1.4 10*3/uL (ref 0.7–4.0)
MCH: 30.3 pg (ref 26.0–34.0)
MCHC: 33 g/dL (ref 30.0–36.0)
MCV: 91.9 fL (ref 78.0–100.0)
MPV: 10 fL (ref 8.6–12.4)
Monocytes Absolute: 0.9 10*3/uL (ref 0.1–1.0)
Monocytes Relative: 11 % (ref 3–12)
NEUTROS ABS: 5.3 10*3/uL (ref 1.7–7.7)
Neutrophils Relative %: 67 % (ref 43–77)
Platelets: 227 10*3/uL (ref 150–400)
RBC: 4.09 MIL/uL — AB (ref 4.22–5.81)
RDW: 14.5 % (ref 11.5–15.5)
WBC: 7.9 10*3/uL (ref 4.0–10.5)

## 2015-03-31 LAB — HEMOGLOBIN A1C
HEMOGLOBIN A1C: 7 % — AB (ref <5.7)
MEAN PLASMA GLUCOSE: 154 mg/dL — AB (ref <117)

## 2015-04-01 ENCOUNTER — Ambulatory Visit: Payer: PPO | Admitting: Family Medicine

## 2015-04-01 LAB — LIPID PANEL
CHOL/HDL RATIO: 4.4 ratio (ref <=5.0)
Cholesterol: 127 mg/dL (ref 125–200)
HDL: 29 mg/dL — AB (ref >=40)
LDL Cholesterol: 67 mg/dL (ref <130)
TRIGLYCERIDES: 156 mg/dL — AB (ref <150)
VLDL: 31 mg/dL — ABNORMAL HIGH (ref <30)

## 2015-04-01 LAB — COMPLETE METABOLIC PANEL WITH GFR
ALBUMIN: 3.8 g/dL (ref 3.6–5.1)
ALT: 12 U/L (ref 9–46)
AST: 23 U/L (ref 10–35)
Alkaline Phosphatase: 45 U/L (ref 40–115)
BILIRUBIN TOTAL: 0.4 mg/dL (ref 0.2–1.2)
BUN: 20 mg/dL (ref 7–25)
CALCIUM: 9.4 mg/dL (ref 8.6–10.3)
CO2: 23 mmol/L (ref 20–31)
Chloride: 99 mmol/L (ref 98–110)
Creat: 1.41 mg/dL — ABNORMAL HIGH (ref 0.70–1.18)
GFR, EST AFRICAN AMERICAN: 56 mL/min — AB (ref >=60)
GFR, EST NON AFRICAN AMERICAN: 49 mL/min — AB (ref >=60)
Glucose, Bld: 201 mg/dL — ABNORMAL HIGH (ref 65–99)
POTASSIUM: 5.2 mmol/L (ref 3.5–5.3)
SODIUM: 133 mmol/L — AB (ref 135–146)
TOTAL PROTEIN: 6.4 g/dL (ref 6.1–8.1)

## 2015-04-01 LAB — PSA, MEDICARE: PSA: 0.83 ng/mL (ref <=4.00)

## 2015-04-01 LAB — TSH: TSH: 2.5 m[IU]/L (ref 0.40–4.50)

## 2015-04-02 DIAGNOSIS — Z79899 Other long term (current) drug therapy: Secondary | ICD-10-CM | POA: Diagnosis not present

## 2015-04-02 DIAGNOSIS — R809 Proteinuria, unspecified: Secondary | ICD-10-CM | POA: Diagnosis not present

## 2015-04-02 DIAGNOSIS — I10 Essential (primary) hypertension: Secondary | ICD-10-CM | POA: Diagnosis not present

## 2015-04-02 DIAGNOSIS — D509 Iron deficiency anemia, unspecified: Secondary | ICD-10-CM | POA: Diagnosis not present

## 2015-04-02 DIAGNOSIS — R7989 Other specified abnormal findings of blood chemistry: Secondary | ICD-10-CM | POA: Diagnosis not present

## 2015-04-02 DIAGNOSIS — E559 Vitamin D deficiency, unspecified: Secondary | ICD-10-CM | POA: Diagnosis not present

## 2015-04-02 DIAGNOSIS — N183 Chronic kidney disease, stage 3 (moderate): Secondary | ICD-10-CM | POA: Diagnosis not present

## 2015-04-06 ENCOUNTER — Other Ambulatory Visit: Payer: Self-pay | Admitting: Family Medicine

## 2015-04-08 ENCOUNTER — Other Ambulatory Visit: Payer: Self-pay | Admitting: Family Medicine

## 2015-04-08 ENCOUNTER — Encounter: Payer: Self-pay | Admitting: Family Medicine

## 2015-04-08 ENCOUNTER — Ambulatory Visit (INDEPENDENT_AMBULATORY_CARE_PROVIDER_SITE_OTHER): Payer: PPO | Admitting: Family Medicine

## 2015-04-08 VITALS — BP 138/64 | HR 74 | Resp 16 | Ht 68.0 in | Wt 146.0 lb

## 2015-04-08 DIAGNOSIS — I150 Renovascular hypertension: Secondary | ICD-10-CM

## 2015-04-08 DIAGNOSIS — Z91199 Patient's noncompliance with other medical treatment and regimen due to unspecified reason: Secondary | ICD-10-CM

## 2015-04-08 DIAGNOSIS — Z5329 Procedure and treatment not carried out because of patient's decision for other reasons: Secondary | ICD-10-CM

## 2015-04-08 DIAGNOSIS — E538 Deficiency of other specified B group vitamins: Secondary | ICD-10-CM | POA: Diagnosis not present

## 2015-04-08 DIAGNOSIS — E785 Hyperlipidemia, unspecified: Secondary | ICD-10-CM

## 2015-04-08 DIAGNOSIS — E1140 Type 2 diabetes mellitus with diabetic neuropathy, unspecified: Secondary | ICD-10-CM

## 2015-04-08 DIAGNOSIS — F17200 Nicotine dependence, unspecified, uncomplicated: Secondary | ICD-10-CM

## 2015-04-08 DIAGNOSIS — D519 Vitamin B12 deficiency anemia, unspecified: Secondary | ICD-10-CM

## 2015-04-08 DIAGNOSIS — Z794 Long term (current) use of insulin: Secondary | ICD-10-CM

## 2015-04-08 DIAGNOSIS — E119 Type 2 diabetes mellitus without complications: Secondary | ICD-10-CM

## 2015-04-08 DIAGNOSIS — J302 Other seasonal allergic rhinitis: Secondary | ICD-10-CM | POA: Diagnosis not present

## 2015-04-08 DIAGNOSIS — IMO0001 Reserved for inherently not codable concepts without codable children: Secondary | ICD-10-CM

## 2015-04-08 DIAGNOSIS — N521 Erectile dysfunction due to diseases classified elsewhere: Secondary | ICD-10-CM

## 2015-04-08 MED ORDER — LOSARTAN POTASSIUM 100 MG PO TABS: 100.0000 mg | ORAL_TABLET | Freq: Every day | ORAL | Status: DC

## 2015-04-08 MED ORDER — GABAPENTIN 300 MG PO CAPS: ORAL_CAPSULE | ORAL | Status: DC

## 2015-04-08 MED ORDER — TEMAZEPAM 15 MG PO CAPS: ORAL_CAPSULE | ORAL | Status: DC

## 2015-04-08 MED ORDER — TAMSULOSIN HCL 0.4 MG PO CAPS: 0.4000 mg | ORAL_CAPSULE | Freq: Every day | ORAL | Status: DC

## 2015-04-08 MED ORDER — AMLODIPINE BESYLATE 10 MG PO TABS: 10.0000 mg | ORAL_TABLET | Freq: Every day | ORAL | Status: DC

## 2015-04-08 MED ORDER — METFORMIN HCL 1000 MG PO TABS
ORAL_TABLET | ORAL | Status: DC
Start: 2015-04-08 — End: 2015-11-25

## 2015-04-08 MED ORDER — FLUTICASONE PROPIONATE 50 MCG/ACT NA SUSP: 1.0000 | Freq: Every day | NASAL | Status: DC

## 2015-04-08 MED ORDER — SPIRONOLACTONE 50 MG PO TABS: 50.0000 mg | ORAL_TABLET | Freq: Two times a day (BID) | ORAL | Status: DC

## 2015-04-08 MED ORDER — CYANOCOBALAMIN 1000 MCG/ML IJ SOLN
1000.0000 ug | Freq: Once | INTRAMUSCULAR | Status: AC
  Administered 2015-04-08: 1000 ug via INTRAMUSCULAR

## 2015-04-08 MED ORDER — PRAVASTATIN SODIUM 20 MG PO TABS: 20.0000 mg | ORAL_TABLET | Freq: Every morning | ORAL | Status: DC

## 2015-04-08 NOTE — Patient Instructions (Signed)
F/u in 4 months, call if you  Need me before  CONGRATS on GREAT blood sugar  Cut back on smoking 1 PPD, aim for 15 by return  You are referred for eye exam to Dr Gershon Crane  HBa1C, chem 7 and EGFR in 4 month  You Can Quit Smoking If you are ready to quit smoking or are thinking about it, congratulations! You have chosen to help yourself be healthier and live longer! There are lots of different ways to quit smoking. Nicotine gum, nicotine patches, a nicotine inhaler, or nicotine nasal spray can help with physical craving. Hypnosis, support groups, and medicines help break the habit of smoking. TIPS TO GET OFF AND STAY OFF CIGARETTES  Learn to predict your moods. Do not let a bad situation be your excuse to have a cigarette. Some situations in your life might tempt you to have a cigarette.  Ask friends and co-workers not to smoke around you.  Make your home smoke-free.  Never have "just one" cigarette. It leads to wanting another and another. Remind yourself of your decision to quit.  On a card, make a list of your reasons for not smoking. Read it at least the same number of times a day as you have a cigarette. Tell yourself everyday, "I do not want to smoke. I choose not to smoke."  Ask someone at home or work to help you with your plan to quit smoking.  Have something planned after you eat or have a cup of coffee. Take a walk or get other exercise to perk you up. This will help to keep you from overeating.  Try a relaxation exercise to calm you down and decrease your stress. Remember, you may be tense and nervous the first two weeks after you quit. This will pass.  Find new activities to keep your hands busy. Play with a pen, coin, or rubber band. Doodle or draw things on paper.  Brush your teeth right after eating. This will help cut down the craving for the taste of tobacco after meals. You can try mouthwash too.  Try gum, breath mints, or diet candy to keep something in your  mouth. IF YOU SMOKE AND WANT TO QUIT:  Do not stock up on cigarettes. Never buy a carton. Wait until one pack is finished before you buy another.  Never carry cigarettes with you at work or at home.  Keep cigarettes as far away from you as possible. Leave them with someone else.  Never carry matches or a lighter with you.  Ask yourself, "Do I need this cigarette or is this just a reflex?"  Bet with someone that you can quit. Put cigarette money in a piggy bank every morning. If you smoke, you give up the money. If you do not smoke, by the end of the week, you keep the money.  Keep trying. It takes 21 days to change a habit!  Talk to your doctor about using medicines to help you quit. These include nicotine replacement gum, lozenges, or skin patches.   This information is not intended to replace advice given to you by your health care provider. Make sure you discuss any questions you have with your health care provider.   Document Released: 11/27/2008 Document Revised: 04/25/2011 Document Reviewed: 11/27/2008 Elsevier Interactive Patient Education 2016 [employer]  Is nose spray for allergies  Thanks for choosing [facility], we consider it a privelige to serve you.

## 2015-04-08 NOTE — Assessment & Plan Note (Signed)
B12 administered IM following pt consent by LPN

## 2015-04-08 NOTE — Assessment & Plan Note (Signed)
Marked improvement, currently at goal, no med changes Donald Everett is reminded of the importance of commitment to daily physical activity for 30 minutes or more, as able and the need to limit carbohydrate intake to 30 to 60 grams per meal to help with blood sugar control.   The need to take medication as prescribed, test blood sugar as directed, and to call between visits if there is a concern that blood sugar is uncontrolled is also discussed.   Donald Everett is reminded of the importance of daily foot exam, annual eye examination, and good blood sugar, blood pressure and cholesterol control.  Diabetic Labs Latest Ref Rng 03/31/2015 01/22/2015 01/14/2015 01/13/2015 01/12/2015  HbA1c <5.7 % 7.0(H) - - - -  Microalbumin <2.0 mg/dL - - - - -  Micro/Creat Ratio 0.0 - 30.0 mg/g - - - - -  Chol 125 - 200 mg/dL 756 - - - -  HDL >=43 mg/dL 32(R) - - - -  Calc LDL <130 mg/dL 67 - - - -  Triglycerides <150 mg/dL 518(A) - - - -  Creatinine 0.70 - 1.18 mg/dL 4.16(S) 0.63(K) 1.60 1.09 1.46(H)   BP/Weight 04/08/2015 01/22/2015 01/14/2015 01/13/2015 01/12/2015 12/16/2014 11/18/2014  Systolic BP 138 138 151 - - 138 -  Diastolic BP 64 70 60 - - 62 -  Wt. (Lbs) 146 152.12 - 150.57 - 162 165  BMI 22.2 23.14 - - 22.23 24.64 24.36   Foot/eye exam completion dates 12/16/2014 01/22/2014  Foot Form Completion Done Done

## 2015-04-08 NOTE — Assessment & Plan Note (Signed)
Controlled, no change in medication Hyperlipidemia:Low fat diet discussed and encouraged.   Lipid Panel  Lab Results  Component Value Date   CHOL 127 03/31/2015   HDL 29* 03/31/2015   LDLCALC 67 03/31/2015   TRIG 156* 03/31/2015   CHOLHDL 4.4 03/31/2015   TG elevated ,needs to  reduce fat in diet

## 2015-04-08 NOTE — Assessment & Plan Note (Signed)

## 2015-04-08 NOTE — Assessment & Plan Note (Signed)
Increased and uncontrolled symptoms add flonase

## 2015-04-08 NOTE — Assessment & Plan Note (Signed)
still refuses colonoscopy and has not yet returned stool cards

## 2015-04-08 NOTE — Progress Notes (Signed)
Subjective:    Patient ID: Donald Everett, male    DOB: 13-Jul-1940, 75 y.o.   MRN: [id number]  HPI   Donald Everett     MRN: [id number]      DOB: [birth year]   HPI Donald Everett is here for follow up and re-evaluation of chronic medical conditions, medication management and review of any available recent lab and radiology data.  Preventive health is updated, specifically  Cancer screening and Immunization.   Still holding off on colonoscopy. 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 The PT denies any adverse reactions to current medications since the last visit.  Denies polyuria, polydipsia, blurred vision , had 1 or 2  hypoglycemic episodes in the 50's states had missed meal Increased clear nasal congestion and runny nose x 3 weeks   ROS Denies recent fever or chills. Denies n, ear pain or sore throat. Denies chest congestion, productive cough or wheezing. Denies chest pains, palpitations and leg swelling Denies abdominal pain, nausea, vomiting,diarrhea or constipation.   Denies dysuria, frequency, hesitancy or incontinence. Deniesuncontrolled  joint pain, swelling and limitation in mobility. Denies headaches, seizures, numbness, or tingling. Denies depression, anxiety or insomnia. Denies skin break down or rash.   PE  BP 138/64 mmHg  Pulse 74  Resp 16  Ht  (1.727 m)  Wt 146 lb (66.225 kg)  BMI 22.20 kg/m2  SpO2 96%  Patient alert and oriented and in no cardiopulmonary distress.  HEENT: No facial asymmetry, EOMI,   oropharynx pink and moist.  Neck supple no JVD, no mass. Erythema and edema of nasal mucosa Chest: Clear to auscultation bilaterally.decreased air entry CVS: S1, S2 no murmurs, no S3.Regular rate.  ABD: Soft non tender.   Ext: No edema  MS: Adequate though reduced ROM spine, shoulders, hips and knees.  Skin: Intact, no  ulcerations or rash noted.  Psych: Good eye contact, normal affect. Memory intact not anxious or depressed appearing.  CNS: CN 2-12 intact, marked hearing loss,  power,  normal throughout.no focal deficits noted.   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   The need to take medication as prescribed, test blood sugar as directed, and to call between visits if there is a concern that blood sugar is uncontrolled is also discussed.   Donald Everett is reminded of the importance of daily foot exam, annual eye examination, and good blood sugar, blood pressure and cholesterol control.  Diabetic Labs Latest Ref Rng 03/31/2015 01/22/2015 01/14/2015 01/13/2015 01/12/2015  HbA1c <5.7 % 7.0(H) - - - -  Microalbumin <2.0 mg/dL - - - - -  Micro/Creat Ratio 0.0 - 30.0 mg/g - - - - -  Chol 125 - 200 mg/dL 536 - - - -  HDL >=64 mg/dL 40(H) - - - -  Calc LDL <130 mg/dL 67 - - - -  Triglycerides <150 mg/dL 474(Q) - - - -  Creatinine 0.70 - 1.18 mg/dL 5.95(G) 3.87(F) 6.43 3.29 1.46(H)   BP/Weight 04/08/2015 01/22/2015 01/14/2015 01/13/2015 01/12/2015 12/16/2014 11/18/2014  Systolic BP 138 138 151 - -  138 -  Diastolic BP 64 70 60 - - 62 -  Wt. (Lbs) 146 152.12 - 150.57 - 162 165  BMI 22.2 23.14 - - 22.23 24.64 24.36   Foot/eye exam completion dates 12/16/2014 01/22/2014  Foot Form Completion Done Done         Renovascular hypertension Controlled, no change in medication DASH diet and commitment to daily physical activity for a minimum of 30 minutes discussed and encouraged, as a part of hypertension management. The importance of attaining a healthy weight is also discussed.  BP/Weight  04/08/2015 01/22/2015 01/14/2015 01/13/2015 01/12/2015 12/16/2014 11/18/2014  Systolic BP 138 138 151 - - 138 -  Diastolic BP 64 70 60 - - 62 -  Wt. (Lbs) 146 152.12 - 150.57 - 162 165  BMI 22.2 23.14 - - 22.23 24.64 24.36        Seasonal allergies Increased and uncontrolled symptoms add flonase  Hyperlipidemia LDL goal <100 Controlled, no change in medication Hyperlipidemia:Low fat diet discussed and encouraged.   Lipid Panel  Lab Results  Component Value Date   CHOL 127 03/31/2015   HDL 29* 03/31/2015   LDLCALC 67 03/31/2015   TRIG 156* 03/31/2015   CHOLHDL 4.4 03/31/2015   TG elevated ,needs to  reduce fat in diet     NICOTINE ADDICTION Patient counseled for approximately 5 minutes regarding the health risks of ongoing nicotine use, specifically all types of cancer, heart disease, stroke and respiratory failure. The options available for help with cessation ,the behavioral changes to assist the process, and the option to either gradully reduce usage  Or abruptly stop.is also discussed. Pt is also encouraged to set specific goals in number of cigarettes used daily, as well as to set a quit date.  Number of cigarettes/cigars currently smoking daily: 20   Patient non-compliant, refused service still refuses colonoscopy and has not yet returned stool cards   B12 deficiency anemia B12 administered IM following pt consent by LPN     Review of Systems     Objective:   Physical Exam        Assessment & Plan:

## 2015-04-08 NOTE — Assessment & Plan Note (Signed)
Controlled, no change in medication DASH diet and commitment to daily physical activity for a minimum of 30 minutes discussed and encouraged, as a part of hypertension management. The importance of attaining a healthy weight is also discussed.  BP/Weight 04/08/2015 01/22/2015 01/14/2015 01/13/2015 01/12/2015 12/16/2014 11/18/2014  Systolic BP 138 138 151 - - 138 -  Diastolic BP 64 70 60 - - 62 -  Wt. (Lbs) 146 152.12 - 150.57 - 162 165  BMI 22.2 23.14 - - 22.23 24.64 24.36

## 2015-04-15 DIAGNOSIS — Z794 Long term (current) use of insulin: Secondary | ICD-10-CM | POA: Diagnosis not present

## 2015-04-15 DIAGNOSIS — H2513 Age-related nuclear cataract, bilateral: Secondary | ICD-10-CM | POA: Diagnosis not present

## 2015-04-15 DIAGNOSIS — H353131 Nonexudative age-related macular degeneration, bilateral, early dry stage: Secondary | ICD-10-CM | POA: Diagnosis not present

## 2015-04-15 DIAGNOSIS — E119 Type 2 diabetes mellitus without complications: Secondary | ICD-10-CM | POA: Diagnosis not present

## 2015-04-28 ENCOUNTER — Other Ambulatory Visit: Payer: Self-pay

## 2015-04-28 DIAGNOSIS — I10 Essential (primary) hypertension: Secondary | ICD-10-CM | POA: Diagnosis not present

## 2015-04-28 DIAGNOSIS — E1129 Type 2 diabetes mellitus with other diabetic kidney complication: Secondary | ICD-10-CM | POA: Diagnosis not present

## 2015-04-28 DIAGNOSIS — E871 Hypo-osmolality and hyponatremia: Secondary | ICD-10-CM | POA: Diagnosis not present

## 2015-04-28 DIAGNOSIS — N189 Chronic kidney disease, unspecified: Secondary | ICD-10-CM | POA: Diagnosis not present

## 2015-04-28 MED ORDER — HYDROCODONE-ACETAMINOPHEN 7.5-325 MG PO TABS: 1.0000 | ORAL_TABLET | Freq: Two times a day (BID) | ORAL | Status: DC | PRN

## 2015-05-05 ENCOUNTER — Ambulatory Visit (INDEPENDENT_AMBULATORY_CARE_PROVIDER_SITE_OTHER): Payer: PPO

## 2015-05-05 ENCOUNTER — Other Ambulatory Visit: Payer: Self-pay | Admitting: Family Medicine

## 2015-05-05 VITALS — BP 134/76

## 2015-05-05 DIAGNOSIS — D519 Vitamin B12 deficiency anemia, unspecified: Secondary | ICD-10-CM

## 2015-05-05 MED ORDER — CYANOCOBALAMIN 1000 MCG/ML IJ SOLN
1000.0000 ug | Freq: Once | INTRAMUSCULAR | Status: AC
  Administered 2015-05-05: 1000 ug via INTRAMUSCULAR

## 2015-05-05 NOTE — Progress Notes (Signed)
Patient in for maintenance dose of b12.   Injection given as ordered.   Will return for next injection as ordered.

## 2015-06-01 ENCOUNTER — Other Ambulatory Visit: Payer: Self-pay | Admitting: Family Medicine

## 2015-06-02 ENCOUNTER — Other Ambulatory Visit: Payer: Self-pay

## 2015-06-02 MED ORDER — HYDROCODONE-ACETAMINOPHEN 7.5-325 MG PO TABS: 1.0000 | ORAL_TABLET | Freq: Two times a day (BID) | ORAL | Status: DC | PRN

## 2015-06-04 ENCOUNTER — Other Ambulatory Visit: Payer: Self-pay | Admitting: Family Medicine

## 2015-06-04 ENCOUNTER — Ambulatory Visit (INDEPENDENT_AMBULATORY_CARE_PROVIDER_SITE_OTHER): Payer: PPO

## 2015-06-04 VITALS — Wt 141.0 lb

## 2015-06-04 DIAGNOSIS — D519 Vitamin B12 deficiency anemia, unspecified: Secondary | ICD-10-CM | POA: Diagnosis not present

## 2015-06-04 MED ORDER — CYANOCOBALAMIN 1000 MCG/ML IJ SOLN
1000.0000 ug | Freq: Once | INTRAMUSCULAR | Status: AC
  Administered 2015-06-04: 1000 ug via INTRAMUSCULAR

## 2015-06-26 ENCOUNTER — Other Ambulatory Visit: Payer: Self-pay

## 2015-06-26 MED ORDER — HYDROCODONE-ACETAMINOPHEN 7.5-325 MG PO TABS: 1.0000 | ORAL_TABLET | Freq: Two times a day (BID) | ORAL | Status: DC | PRN

## 2015-07-02 ENCOUNTER — Ambulatory Visit (INDEPENDENT_AMBULATORY_CARE_PROVIDER_SITE_OTHER): Payer: PPO

## 2015-07-02 DIAGNOSIS — D519 Vitamin B12 deficiency anemia, unspecified: Secondary | ICD-10-CM | POA: Diagnosis not present

## 2015-07-02 MED ORDER — CYANOCOBALAMIN 1000 MCG/ML IJ SOLN
1000.0000 ug | Freq: Once | INTRAMUSCULAR | Status: AC
  Administered 2015-07-02: 1000 ug via INTRAMUSCULAR

## 2015-07-02 NOTE — Progress Notes (Signed)
Patient in for routine B12 injection.  Injection given as ordered.   No voiced complaints.   Will keep next scheduled office visit.  Collect Hydrocodone rx today.

## 2015-07-03 ENCOUNTER — Other Ambulatory Visit: Payer: Self-pay | Admitting: Family Medicine

## 2015-07-07 ENCOUNTER — Other Ambulatory Visit: Payer: Self-pay

## 2015-07-07 MED ORDER — ALPRAZOLAM 0.5 MG PO TABS: 0.5000 mg | ORAL_TABLET | Freq: Every day | ORAL | Status: DC

## 2015-07-24 ENCOUNTER — Other Ambulatory Visit: Payer: Self-pay

## 2015-07-24 MED ORDER — HYDROCODONE-ACETAMINOPHEN 7.5-325 MG PO TABS: 1.0000 | ORAL_TABLET | Freq: Two times a day (BID) | ORAL | Status: DC | PRN

## 2015-07-31 ENCOUNTER — Other Ambulatory Visit: Payer: Self-pay | Admitting: Family Medicine

## 2015-07-31 DIAGNOSIS — E119 Type 2 diabetes mellitus without complications: Secondary | ICD-10-CM | POA: Diagnosis not present

## 2015-07-31 DIAGNOSIS — Z794 Long term (current) use of insulin: Secondary | ICD-10-CM | POA: Diagnosis not present

## 2015-07-31 DIAGNOSIS — D599 Acquired hemolytic anemia, unspecified: Secondary | ICD-10-CM | POA: Diagnosis not present

## 2015-07-31 LAB — BASIC METABOLIC PANEL WITH GFR
BUN: 19 mg/dL (ref 7–25)
CALCIUM: 9.3 mg/dL (ref 8.6–10.3)
CO2: 25 mmol/L (ref 20–31)
CREATININE: 1.18 mg/dL (ref 0.70–1.18)
Chloride: 101 mmol/L (ref 98–110)
GFR, EST AFRICAN AMERICAN: 70 mL/min (ref >=60)
GFR, Est Non African American: 60 mL/min (ref >=60)
Glucose, Bld: 165 mg/dL — ABNORMAL HIGH (ref 65–99)
Potassium: 5.4 mmol/L — ABNORMAL HIGH (ref 3.5–5.3)
Sodium: 135 mmol/L (ref 135–146)

## 2015-07-31 LAB — HEMOGLOBIN A1C
HEMOGLOBIN A1C: 7.1 % — AB (ref <5.7)
Mean Plasma Glucose: 157 mg/dL

## 2015-08-05 ENCOUNTER — Ambulatory Visit (INDEPENDENT_AMBULATORY_CARE_PROVIDER_SITE_OTHER): Payer: PPO | Admitting: Family Medicine

## 2015-08-05 ENCOUNTER — Encounter (INDEPENDENT_AMBULATORY_CARE_PROVIDER_SITE_OTHER): Payer: Self-pay

## 2015-08-05 ENCOUNTER — Encounter: Payer: Self-pay | Admitting: Family Medicine

## 2015-08-05 VITALS — BP 134/60 | HR 77 | Resp 16 | Ht 68.0 in | Wt 140.1 lb

## 2015-08-05 DIAGNOSIS — Z5329 Procedure and treatment not carried out because of patient's decision for other reasons: Secondary | ICD-10-CM

## 2015-08-05 DIAGNOSIS — I150 Renovascular hypertension: Secondary | ICD-10-CM | POA: Diagnosis not present

## 2015-08-05 DIAGNOSIS — IMO0001 Reserved for inherently not codable concepts without codable children: Secondary | ICD-10-CM

## 2015-08-05 DIAGNOSIS — F17200 Nicotine dependence, unspecified, uncomplicated: Secondary | ICD-10-CM

## 2015-08-05 DIAGNOSIS — E785 Hyperlipidemia, unspecified: Secondary | ICD-10-CM

## 2015-08-05 DIAGNOSIS — D519 Vitamin B12 deficiency anemia, unspecified: Secondary | ICD-10-CM

## 2015-08-05 DIAGNOSIS — E119 Type 2 diabetes mellitus without complications: Secondary | ICD-10-CM | POA: Diagnosis not present

## 2015-08-05 DIAGNOSIS — Z91199 Patient's noncompliance with other medical treatment and regimen due to unspecified reason: Secondary | ICD-10-CM

## 2015-08-05 DIAGNOSIS — Z794 Long term (current) use of insulin: Secondary | ICD-10-CM | POA: Diagnosis not present

## 2015-08-05 DIAGNOSIS — R911 Solitary pulmonary nodule: Secondary | ICD-10-CM

## 2015-08-05 DIAGNOSIS — R634 Abnormal weight loss: Secondary | ICD-10-CM

## 2015-08-05 LAB — HEPATIC FUNCTION PANEL
ALBUMIN: 3.6 g/dL (ref 3.6–5.1)
ALK PHOS: 47 U/L (ref 40–115)
ALT: 11 U/L (ref 9–46)
AST: 20 U/L (ref 10–35)
BILIRUBIN DIRECT: 0.1 mg/dL (ref <=0.2)
BILIRUBIN TOTAL: 0.3 mg/dL (ref 0.2–1.2)
Indirect Bilirubin: 0.2 mg/dL (ref 0.2–1.2)
Total Protein: 6.2 g/dL (ref 6.1–8.1)

## 2015-08-05 MED ORDER — CYANOCOBALAMIN 1000 MCG/ML IJ SOLN
1000.0000 ug | Freq: Once | INTRAMUSCULAR | Status: AC
  Administered 2015-08-05: 1000 ug via INTRAMUSCULAR

## 2015-08-05 NOTE — Patient Instructions (Signed)
F/u in 3 month, call if you need me sooner  Blood sugar and kidney function are excellent  Very concerned about weight loss, you need to see a Specialist, I recommend Dr Karilyn Cotaehman , who is in the office downstairs in this building  Call on Friday as you said , so that we know to go ahead with the referral  I also recommend a repeat chest scan, and will move forward slowly, staring with the GI referral once you call  Microalb today, and additional tests to be added to blood already drawn, CBc and hepatic panel  Thank you  for choosing [facility]. We consider it a privelige to serve you.  Delivering excellent health care in a caring and  compassionate way is our goal.  Partnering with you,  so that together we can achieve this goal is our strategy.    Please work on stopping smoking.

## 2015-08-06 ENCOUNTER — Other Ambulatory Visit: Payer: Self-pay | Admitting: Family Medicine

## 2015-08-06 LAB — MICROALBUMIN / CREATININE URINE RATIO
CREATININE, URINE: 65 mg/dL (ref 20–370)
Microalb Creat Ratio: 34 mcg/mg creat — ABNORMAL HIGH (ref <30)
Microalb, Ur: 2.2 mg/dL — ABNORMAL HIGH

## 2015-08-08 ENCOUNTER — Telehealth: Payer: Self-pay | Admitting: Family Medicine

## 2015-08-08 DIAGNOSIS — R634 Abnormal weight loss: Secondary | ICD-10-CM | POA: Insufficient documentation

## 2015-08-08 NOTE — Assessment & Plan Note (Signed)
Improved, no med change [name] is reminded of the importance of commitment to daily physical activity for 30 minutes or more, as able and the need to limit carbohydrate intake to 30 to 60 grams per meal to help with blood sugar control.   The need to take medication as prescribed, test blood sugar as directed, and to call between visits if there is a concern that blood sugar is uncontrolled is also discussed.   [name] is reminded of the importance of daily foot exam, annual eye examination, and good blood sugar, blood pressure and cholesterol control.  Diabetic Labs Latest Ref Rng 08/05/2015 07/31/2015 03/31/2015 01/22/2015 01/14/2015  HbA1c <5.7 % - 7.1(H) 7.0(H) - -  Microalbumin Not estab mg/dL 2.2(H) - - - -  Micro/Creat Ratio <30 mcg/mg creat 34(H) - - - -  Chol 125 - 200 mg/dL - - 161127 - -  HDL >=09>=40 mg/dL - - 60(A29(L) - -  Calc LDL <130 mg/dL - - 67 - -  Triglycerides <150 mg/dL - - 540(J156(H) - -  Creatinine 0.70 - 1.18 mg/dL - 8.111.18 9.14(N1.41(H) 8.29(F1.29(H) 0.93   BP/Weight 08/05/2015 06/04/2015 05/05/2015 04/08/2015 01/22/2015 01/14/2015 01/13/2015  Systolic BP 134 - 134 138 138 [id number] -  Diastolic BP 60 - 76 64 70 60 -  Wt. (Lbs) 140.12 141 - 146 152.12 - 150.57  BMI 21.31 21.44 - 22.2 23.14 - -   Foot/eye exam completion dates 12/16/2014 01/22/2014  Foot Form Completion Done Done

## 2015-08-08 NOTE — Assessment & Plan Note (Signed)
unexplained weight loss, has repeatedly refused even stool testing, f/h colon cancer. Will call back to get GI eval, cites transport as the problem, we will also f/u he is afraid and has been for a while

## 2015-08-08 NOTE — Assessment & Plan Note (Signed)

## 2015-08-08 NOTE — Assessment & Plan Note (Signed)
Hyperlipidemia:Low fat diet discussed and encouraged.   Lipid Panel  Lab Results  Component Value Date   CHOL 127 03/31/2015   HDL 29* 03/31/2015   LDLCALC 67 03/31/2015   TRIG 156* 03/31/2015   CHOLHDL 4.4 03/31/2015   Needs to lower fat intake

## 2015-08-08 NOTE — Assessment & Plan Note (Signed)
B12 administered at visit IM 

## 2015-08-08 NOTE — Telephone Encounter (Signed)
pls make a f/u call to pt, he stated he would contact us this past Friday to give the "go ahead" for referral to Dr Karilyn Cotaehman, due to unexplained weight loss, let me knwo iof he agrees pls , I will enter

## 2015-08-08 NOTE — Assessment & Plan Note (Signed)
Needs GI eval and rept chest scan, needs to stop smoking. Pt to call back to give the oK for referrals

## 2015-08-08 NOTE — Assessment & Plan Note (Signed)
Repeat imaging needed, he continues to smoke 1PPD, patient  elects to have gI eval before this, will f/u

## 2015-08-08 NOTE — Assessment & Plan Note (Signed)
Controlled, no change in medication DASH diet and commitment to daily physical activity for a minimum of 30 minutes discussed and encouraged, as a part of hypertension management. The importance of attaining a healthy weight is also discussed.  BP/Weight 08/05/2015 06/04/2015 05/05/2015 04/08/2015 01/22/2015 01/14/2015 01/13/2015  Systolic BP 134 - 134 138 138 [id number] -  Diastolic BP 60 - 76 64 70 60 -  Wt. (Lbs) 140.12 141 - 146 152.12 - 150.57  BMI 21.31 21.44 - 22.2 23.14 - -

## 2015-08-08 NOTE — Progress Notes (Signed)
Donald CancelWilliam L Everett     MRN: [id number]      DOB: 09/11/1940   HPI Donald Everett is here for follow up and re-evaluation of chronic medical conditions, medication management and review of any available recent lab and radiology data.  Preventive health is updated, specifically  Cancer screening and Immunization.   Questions or concerns regarding consultations or procedures which the PT has had in the interim are  addressed. The PT denies any adverse reactions to current medications since the last visit.  Denies polyuria, polydipsia, blurred vision , or hypoglycemic episodes. 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 Denies sinus pressure, nasal congestion, ear pain or sore throat. Denies chest congestion, productive cough or wheezing. Denies chest pains, palpitations and leg swelling Denies abdominal pain, nausea, vomiting,diarrhea or constipation.   Denies dysuria, frequency, hesitancy or incontinence. Denies uncontrolled joint pain, swelling and limitation in mobility. Denies headaches, seizures, numbness, or tingling. Denies depression, anxiety or insomnia. Denies skin break down or rash.   PE  BP 134/60 mmHg  Pulse 77  Resp 16  Ht 5\' 8"  (1.727 m)  Wt 140 lb 1.9 oz (63.558 kg)  BMI 21.31 kg/m2  SpO2 98%  Patient alert and oriented and in no cardiopulmonary distress.  HEENT: No facial asymmetry, EOMI,   oropharynx pink and moist.  Neck supple no JVD, no mass.  Chest: Clear to auscultation bilaterally.  CVS: S1, S2 no murmurs, no S3.Regular rate.  ABD: Soft non tender.   Ext: No edema  MS: Adequate though educed ROM spine, shoulders, hips and knees.  Skin: Intact, no ulcerations or rash noted.  Psych: Good eye contact, normal affect. Memory intact not anxious  or depressed appearing.  CNS: CN 2-12 intact, power,  normal throughout.no focal deficits noted.   Assessment & Plan   Renovascular hypertension Controlled, no change in medication DASH diet and commitment to daily physical activity for a minimum of 30 minutes discussed and encouraged, as a part of hypertension management. The importance of attaining a healthy weight is also discussed.  BP/Weight 08/05/2015 06/04/2015 05/05/2015 04/08/2015 01/22/2015 01/14/2015 01/13/2015  Systolic BP 134 - 134 138 138 [id number] -  Diastolic BP 60 - 76 64 70 60 -  Wt. (Lbs) 140.12 141 - 146 152.12 - 150.57  BMI 21.31 21.44 - 22.2 23.14 - -        Diabetes mellitus, insulin dependent (IDDM), controlled (HCC) Improved, no med change Donald Everett is reminded of the importance of commitment to daily physical activity for 30 minutes or more, as able and the need to limit carbohydrate intake to 30 to 60 grams per meal to help with blood sugar control.   The need to take medication as prescribed, test blood sugar as directed, and to call between visits if there is a concern that blood sugar is uncontrolled is also discussed.   Donald Everett is reminded of the importance of daily foot exam, annual eye examination, and good blood sugar, blood pressure and cholesterol control.  Diabetic Labs Latest Ref Rng 08/05/2015 07/31/2015 03/31/2015 01/22/2015 01/14/2015  HbA1c <5.7 % - 7.1(H) 7.0(H) - -  Microalbumin Not estab mg/dL 2.2(H) - - - -  Micro/Creat Ratio <30 mcg/mg creat 34(H) - - - -  Chol 125 - 200 mg/dL - - 161127 - -  HDL >=09>=40 mg/dL - - 60(A29(L) - -  Calc LDL <130 mg/dL - - 67 - -  Triglycerides <150 mg/dL - - 540(J156(H) - -  Creatinine 0.70 - 1.18 mg/dL - 8.111.18 9.14(N1.41(H) 8.29(F1.29(H) 0.93   BP/Weight 08/05/2015 06/04/2015 05/05/2015 04/08/2015 01/22/2015 01/14/2015 01/13/2015  Systolic BP 134 - 134 138 138 [id number] -  Diastolic BP 60 - 76 64 70 60 -  Wt. (Lbs) 140.12 141 - 146 152.12 - 150.57  BMI 21.31 21.44 - 22.2 23.14 - -    Foot/eye exam completion dates 12/16/2014 01/22/2014  Foot Form Completion Done Done         Patient non-compliant, refused service unexplained weight loss, has repeatedly refused even stool testing, f/h colon cancer. Will call back to get GI eval, cites transport as the problem, we will also f/u he is afraid and has been for a while  Lung nodule seen on imaging study Repeat imaging needed, he continues to smoke 1PPD, patient  elects to have gI eval before this, will f/u  NICOTINE ADDICTION Patient counseled for approximately 5 minutes regarding the health risks of ongoing nicotine use, specifically all types of cancer, heart disease, stroke and respiratory failure. The options available for help with cessation ,the behavioral changes to assist the process, and the option to either gradully reduce usage  Or abruptly stop.is also discussed. Pt is also encouraged to set specific goals in number of cigarettes used daily, as well as to set a quit date.  Number of cigarettes/cigars currently smoking daily: 20   B12 deficiency anemia B12 administered at visit IM  Hyperlipidemia LDL goal <100 Hyperlipidemia:Low fat diet discussed and encouraged.   Lipid Panel  Lab Results  Component Value Date   CHOL 127 03/31/2015   HDL 29* 03/31/2015   LDLCALC 67 03/31/2015   TRIG 156* 03/31/2015   CHOLHDL 4.4 03/31/2015   Needs to lower fat intake     Unintentional weight loss Needs GI eval and rept chest scan, needs to stop smoking. Pt to call back to give the oK for referrals

## 2015-08-11 NOTE — Telephone Encounter (Signed)
States he is going tomorrow to try to get his license and will call after that to let us know if he wants to be referred to GI

## 2015-08-12 NOTE — Addendum Note (Signed)
Addended by: Abner GreenspanHUDY, BRANDI H on: 08/12/2015 11:34 AM   Modules accepted: Orders

## 2015-08-12 NOTE — Telephone Encounter (Signed)
Referred to GI

## 2015-08-19 ENCOUNTER — Encounter (INDEPENDENT_AMBULATORY_CARE_PROVIDER_SITE_OTHER): Payer: Self-pay | Admitting: *Deleted

## 2015-08-19 ENCOUNTER — Other Ambulatory Visit (INDEPENDENT_AMBULATORY_CARE_PROVIDER_SITE_OTHER): Payer: Self-pay | Admitting: Internal Medicine

## 2015-08-19 ENCOUNTER — Encounter (INDEPENDENT_AMBULATORY_CARE_PROVIDER_SITE_OTHER): Payer: Self-pay | Admitting: Internal Medicine

## 2015-08-19 ENCOUNTER — Ambulatory Visit (INDEPENDENT_AMBULATORY_CARE_PROVIDER_SITE_OTHER): Payer: PPO | Admitting: Internal Medicine

## 2015-08-19 VITALS — BP 122/50 | HR 72 | Temp 98.3°F | Ht 69.0 in | Wt 137.0 lb

## 2015-08-19 DIAGNOSIS — R634 Abnormal weight loss: Secondary | ICD-10-CM | POA: Diagnosis not present

## 2015-08-19 NOTE — Progress Notes (Signed)
Subjective:    Patient ID: Donald Everett, male    DOB: 11/24/1940, 75 y.o.   MRN: [id number]  HPI Referred by Dr Lodema HongSimpson for weight loss. He tells me has constipation. He cannot have a BM unless he takes Miralax. If he doesn't take the Miralax for 3-4 days and then he will go 3 times a day.  If he takes the Miralax  he will have one stool a day.  He has lost about 13 pound over the past year. He cannot tell me why he has had weight loss. He is eating 3 meals a day. He eats out x 1 a day. He is a smoker.  Acid reflux controlled with Protonix.  No melena or BRRB. No family hx of colon cancer.        BP/Weight 08/05/2015 06/04/2015 05/05/2015 04/08/2015 01/22/2015 01/14/2015 01/13/2015  Systolic BP 134 - 134 138 138 [id number] -  Diastolic BP 60 - 76 64 70 60 -  Wt. (Lbs) 140.12 141 - 146 152.12 - 150.57  BMI 21.31 21.44 - 22.2 23.14 - -       CBC    Component Value Date/Time   WBC 7.9 03/31/2015 0809   RBC 4.09* 03/31/2015 0809   HGB 12.4* 03/31/2015 0809   HCT 37.6* 03/31/2015 0809   PLT 227 03/31/2015 0809   MCV 91.9 03/31/2015 0809   MCH 30.3 03/31/2015 0809   MCHC 33.0 03/31/2015 0809   RDW 14.5 03/31/2015 0809   LYMPHSABS 1.4 03/31/2015 0809   MONOABS 0.9 03/31/2015 0809   EOSABS 0.3 03/31/2015 0809   BASOSABS 0.0 03/31/2015 0809   Hepatic Function Panel     Component Value Date/Time   PROT 6.2 07/31/2015 0833   ALBUMIN 3.6 07/31/2015 0833   AST 20 07/31/2015 0833   ALT 11 07/31/2015 0833   ALKPHOS 47 07/31/2015 0833   BILITOT 0.3 07/31/2015 0833   BILIDIR 0.1 07/31/2015 0833   IBILI 0.2 07/31/2015 0833        Review of Systems Past Medical History  Diagnosis Date  . Allergy   . Anxiety   . Hyperlipidemia   . Hypertension   . ED (erectile dysfunction)   . Nicotine addiction   . Chronic low back pain   . Insomnia   . COPD (chronic obstructive pulmonary disease) (HCC)   . Peripheral arterial disease (HCC)   .  Renal artery stenosis (HCC)   . Type II diabetes mellitus (HCC)   . GERD (gastroesophageal reflux disease)   . Arthritis     "joints" (02/25/2013)  . Depression     Past Surgical History  Procedure Laterality Date  . Femoral artery stent Right 02/25/2013  . Lower extremity angiogram Bilateral 02/25/2013    Procedure: LOWER EXTREMITY ANGIOGRAM;  Surgeon: Runell GessJonathan J Berry, MD;  [facility];  Service: Cardiovascular;  Laterality: Bilateral;    No Known Allergies  Current Outpatient Prescriptions on File Prior to Visit  Medication Sig Dispense Refill  . ADVAIR DISKUS 100-50 MCG/DOSE AEPB INHALE 1 INHALATION EVERY 12 HOURS. 180 each 0  . ALPRAZolam (XANAX) 0.5 MG tablet Take 1 tablet (0.5 mg total) by mouth at bedtime. 90 tablet 0  . amLODipine (NORVASC) 10 MG tablet Take 1 tablet (10 mg total) by mouth daily. 90 tablet 1  . aspirin EC 325 MG EC tablet Take 1 tablet (325 mg total) by mouth daily. 30 tablet 0  . B Complex-C (B-COMPLEX WITH VITAMIN C) tablet Take 1 tablet by  mouth daily.    . B-D ULTRAFINE III SHORT PEN 31G X 8 MM MISC USE AS DIRECTED. 100 each 1  . fluticasone (FLONASE) 50 MCG/ACT nasal spray Place 1 spray into both nostrils daily. 16 g 4  . gabapentin (NEURONTIN) 300 MG capsule TAKE ONE CAPSULES BY MOUTH 2 TIMES A DAY AND 2 AT BEDTIME. 360 capsule 1  . HYDROcodone-acetaminophen (NORCO) 7.5-325 MG tablet Take 1 tablet by mouth 2 (two) times daily as needed. 60 tablet 0  . insulin aspart protamine- aspart (NOVOLOG MIX 70/30) (70-30) 100 UNIT/ML injection Inject 0.1 mLs (10 Units total) into the skin 2 (two) times daily with a meal. (Patient taking differently: Inject 10 Units into the skin 2 (two) times daily with a meal. 25 in am and 10 at night.) 30 mL 0  . JANUVIA 100 MG tablet TAKE ONE TABLET BY MOUTH DAILY. (Patient taking differently: TAKE ONE TABLET BY MOUTH DAILY at 12 pm (if needed; if glucose running high)) 90 tablet 1  . losartan (COZAAR) 100 MG tablet Take 1  tablet (100 mg total) by mouth daily. 90 tablet 1  . metFORMIN (GLUCOPHAGE) 1000 MG tablet TAKE ONE TABLET BY MOUTH TWICE DAILY WITH A MEAL. 180 tablet 1  . ONE TOUCH ULTRA TEST test strip USE TO TEST THREE TIMES DAILY. 100 each 5  . ONETOUCH DELICA LANCETS 33G MISC USE TO TEST THREE TIMES DAILY. 100 each 2  . pantoprazole (PROTONIX) 40 MG tablet TAKE ONE TABLET BY MOUTH DAILY. 90 tablet 0  . polyethylene glycol (MIRALAX / GLYCOLAX) packet Take 17 g by mouth daily as needed for mild constipation. Mix with 8 oz of water.    . pravastatin (PRAVACHOL) 20 MG tablet Take 1 tablet (20 mg total) by mouth every morning. 90 tablet 1  . spironolactone (ALDACTONE) 50 MG tablet Take 1 tablet (50 mg total) by mouth 2 (two) times daily. 180 tablet 1  . tamsulosin (FLOMAX) 0.4 MG CAPS capsule Take 1 capsule (0.4 mg total) by mouth daily. 90 capsule 1  . temazepam (RESTORIL) 15 MG capsule TAKE ONE CAPSULE BY MOUTH AT BEDTIME AS NEEDED FOR SLEEP. 90 capsule 0  . Alcohol Swabs PADS For use with blood sugar checks 100 each 3   No current facility-administered medications on file prior to visit.        Objective:   Physical Exam Blood pressure 122/50, pulse 72, temperature 98.3 F (36.8 C), height 5\' 9"  (1.753 m), weight 137 lb (62.143 kg).  Alert and oriented. Skin warm and dry. Oral mucosa is moist.   . Sclera anicteric, conjunctivae is pink. Thyroid not enlarged. No cervical lymphadenopathy. Lungs clear. Heart regular rate and rhythm.  Abdomen is soft. Bowel sounds are positive. No hepatomegaly. No abdominal masses felt. No tenderness.  No edema to lower extremities.  Stool brown and guaiac negative.       Assessment & Plan:  Weight loss.  Colonic neoplasm needs to be ruled out., The risks and benefits such as perforation, bleeding, and infection were reviewed with the patient and is agreeable.

## 2015-08-19 NOTE — Patient Instructions (Signed)
Colonoscopy.  The risks and benefits such as perforation, bleeding, and infection were reviewed with the patient and is agreeable. 

## 2015-08-27 ENCOUNTER — Other Ambulatory Visit: Payer: Self-pay

## 2015-08-27 MED ORDER — HYDROCODONE-ACETAMINOPHEN 7.5-325 MG PO TABS: 1.0000 | ORAL_TABLET | Freq: Two times a day (BID) | ORAL | Status: DC | PRN

## 2015-08-28 ENCOUNTER — Encounter (HOSPITAL_COMMUNITY): Payer: Self-pay | Admitting: *Deleted

## 2015-08-28 ENCOUNTER — Ambulatory Visit (HOSPITAL_COMMUNITY)
Admission: RE | Admit: 2015-08-28 | Discharge: 2015-08-28 | Disposition: A | Discharge status: Home / Self Care | Payer: PPO | Source: Ambulatory Visit | Attending: Internal Medicine | Admitting: Internal Medicine

## 2015-08-28 ENCOUNTER — Encounter (HOSPITAL_COMMUNITY)
Admission: RE | Disposition: A | Discharge status: Home / Self Care | Payer: Self-pay | Source: Ambulatory Visit | Attending: Internal Medicine

## 2015-08-28 DIAGNOSIS — E785 Hyperlipidemia, unspecified: Secondary | ICD-10-CM | POA: Insufficient documentation

## 2015-08-28 DIAGNOSIS — R634 Abnormal weight loss: Secondary | ICD-10-CM

## 2015-08-28 DIAGNOSIS — Z7982 Long term (current) use of aspirin: Secondary | ICD-10-CM | POA: Diagnosis not present

## 2015-08-28 DIAGNOSIS — I10 Essential (primary) hypertension: Secondary | ICD-10-CM | POA: Diagnosis not present

## 2015-08-28 DIAGNOSIS — Z1211 Encounter for screening for malignant neoplasm of colon: Secondary | ICD-10-CM | POA: Insufficient documentation

## 2015-08-28 DIAGNOSIS — J449 Chronic obstructive pulmonary disease, unspecified: Secondary | ICD-10-CM | POA: Diagnosis not present

## 2015-08-28 DIAGNOSIS — Z7984 Long term (current) use of oral hypoglycemic drugs: Secondary | ICD-10-CM | POA: Insufficient documentation

## 2015-08-28 DIAGNOSIS — K5730 Diverticulosis of large intestine without perforation or abscess without bleeding: Secondary | ICD-10-CM | POA: Diagnosis not present

## 2015-08-28 DIAGNOSIS — K644 Residual hemorrhoidal skin tags: Secondary | ICD-10-CM | POA: Diagnosis not present

## 2015-08-28 DIAGNOSIS — K5909 Other constipation: Secondary | ICD-10-CM | POA: Diagnosis not present

## 2015-08-28 DIAGNOSIS — F17210 Nicotine dependence, cigarettes, uncomplicated: Secondary | ICD-10-CM | POA: Insufficient documentation

## 2015-08-28 DIAGNOSIS — E119 Type 2 diabetes mellitus without complications: Secondary | ICD-10-CM | POA: Insufficient documentation

## 2015-08-28 DIAGNOSIS — Q438 Other specified congenital malformations of intestine: Secondary | ICD-10-CM | POA: Diagnosis not present

## 2015-08-28 HISTORY — PX: COLONOSCOPY: SHX5424

## 2015-08-28 LAB — GLUCOSE, CAPILLARY: GLUCOSE-CAPILLARY: 182 mg/dL — AB (ref 65–99)

## 2015-08-28 SURGERY — COLONOSCOPY
Anesthesia: Moderate Sedation

## 2015-08-28 MED ORDER — MEPERIDINE HCL 50 MG/ML IJ SOLN
INTRAMUSCULAR | Status: DC | PRN
  Administered 2015-08-28 (×2): 25 mg via INTRAVENOUS

## 2015-08-28 MED ORDER — STERILE WATER FOR IRRIGATION IR SOLN
Status: DC | PRN
  Administered 2015-08-28: 10:00:00

## 2015-08-28 MED ORDER — MIDAZOLAM HCL 5 MG/5ML IJ SOLN
INTRAMUSCULAR | Status: DC | PRN
  Administered 2015-08-28 (×2): 1 mg via INTRAVENOUS
  Administered 2015-08-28: 2 mg via INTRAVENOUS
  Administered 2015-08-28: 1 mg via INTRAVENOUS
  Administered 2015-08-28: 2 mg via INTRAVENOUS
  Administered 2015-08-28: 1 mg via INTRAVENOUS
  Administered 2015-08-28: 2 mg via INTRAVENOUS

## 2015-08-28 MED ORDER — SODIUM CHLORIDE 0.9 % IV SOLN
INTRAVENOUS | Status: DC
  Administered 2015-08-28: 10:00:00 via INTRAVENOUS

## 2015-08-28 MED ORDER — MIDAZOLAM HCL 5 MG/5ML IJ SOLN
INTRAMUSCULAR | Status: AC
  Filled 2015-08-28: qty 10

## 2015-08-28 MED ORDER — MEPERIDINE HCL 50 MG/ML IJ SOLN
INTRAMUSCULAR | Status: AC
  Filled 2015-08-28: qty 1

## 2015-08-28 NOTE — Discharge Instructions (Signed)
Resume usual medications and high fiber diet. No driving for 24 hours. Virtual colonoscopy to be scheduled in one month. Office will call.  Colonoscopy, Care After These instructions give you information on caring for yourself after your procedure. Your doctor may also give you more specific instructions. Call your doctor if you have any problems or questions after your procedure. HOME CARE  Do not drive for 24 hours.  Do not sign important papers or use machinery for 24 hours.  You may shower.  You may go back to your usual activities, but go slower for the first 24 hours.  Take rest breaks often during the first 24 hours.  Walk around or use warm packs on your belly (abdomen) if you have belly cramping or gas.  Drink enough fluids to keep your pee (urine) clear or pale yellow.  Resume your normal diet. Avoid heavy or fried foods.  Avoid drinking alcohol for 24 hours or as told by your doctor.  Only take medicines as told by your doctor. If a tissue sample (biopsy) was taken during the procedure:   Do not take aspirin or blood thinners for 7 days, or as told by your doctor.  Do not drink alcohol for 7 days, or as told by your doctor.  Eat soft foods for the first 24 hours. GET HELP IF: You still have a small amount of blood in your poop (stool) 2-3 days after the procedure. GET HELP RIGHT AWAY IF:  You have more than a small amount of blood in your poop.  You see clumps of tissue (blood clots) in your poop.  Your belly is puffy (swollen).  You feel sick to your stomach (nauseous) or throw up (vomit).  You have a fever.  You have belly pain that gets worse and medicine does not help. MAKE SURE YOU:  Understand these instructions.  Will watch your condition.  Will get help right away if you are not doing well or get worse.   This information is not intended to replace advice given to you by your health care provider. Make sure you discuss any questions you have  with your health care provider.   Document Released: 03/05/2010 Document Revised: 02/05/2013 Document Reviewed: 10/08/2012 Elsevier Interactive Patient Education 2016 Elsevier Inc.   High-Fiber Diet Fiber, also called dietary fiber, is a type of carbohydrate found in fruits, vegetables, whole grains, and beans. A high-fiber diet can have many health benefits. Your health care provider may recommend a high-fiber diet to help:  Prevent constipation. Fiber can make your bowel movements more regular.  Lower your cholesterol.  Relieve hemorrhoids, uncomplicated diverticulosis, or irritable bowel syndrome.  Prevent overeating as part of a weight-loss plan.  Prevent heart disease, type 2 diabetes, and certain cancers. WHAT IS MY PLAN? The recommended daily intake of fiber includes:  38 grams for men under age 350.  30 grams for men over age 75.  25 grams for women under age 75.  21 grams for women over age 75. You can get the recommended daily intake of dietary fiber by eating a variety of fruits, vegetables, grains, and beans. Your health care provider may also recommend a fiber supplement if it is not possible to get enough fiber through your diet. WHAT DO I NEED TO KNOW ABOUT A HIGH-FIBER DIET?  Fiber supplements have not been widely studied for their effectiveness, so it is better to get fiber through food sources.  Always check the fiber content on thenutrition facts label of  any prepackaged food. Look for foods that contain at least 5 grams of fiber per serving.  Ask your dietitian if you have questions about specific foods that are related to your condition, especially if those foods are not listed in the following section.  Increase your daily fiber consumption gradually. Increasing your intake of dietary fiber too quickly may cause bloating, cramping, or gas.  Drink plenty of water. Water helps you to digest fiber. WHAT FOODS CAN I EAT? Grains Whole-grain breads.  Multigrain cereal. Oats and oatmeal. Brown rice. Barley. Bulgur wheat. Millet. Bran muffins. Popcorn. Rye wafer crackers. Vegetables Sweet potatoes. Spinach. Kale. Artichokes. Cabbage. Broccoli. Green peas. Carrots. Squash. Fruits Berries. Pears. Apples. Oranges. Avocados. Prunes and raisins. Dried figs. Meats and Other Protein Sources Navy, kidney, pinto, and soy beans. Split peas. Lentils. Nuts and seeds. Dairy Fiber-fortified yogurt. Beverages Fiber-fortified soy milk. Fiber-fortified orange juice. Other Fiber bars. The items listed above may not be a complete list of recommended foods or beverages. Contact your dietitian for more options. WHAT FOODS ARE NOT RECOMMENDED? Grains White bread. Pasta made with refined flour. White rice. Vegetables Fried potatoes. Canned vegetables. Well-cooked vegetables.  Fruits Fruit juice. Cooked, strained fruit. Meats and Other Protein Sources Fatty cuts of meat. Fried Environmental education officer or fried fish. Dairy Milk. Yogurt. Cream cheese. Sour cream. Beverages Soft drinks. Other Cakes and pastries. Butter and oils. The items listed above may not be a complete list of foods and beverages to avoid. Contact your dietitian for more information. WHAT ARE SOME TIPS FOR INCLUDING HIGH-FIBER FOODS IN MY DIET?  Eat a wide variety of high-fiber foods.  Make sure that half of all grains consumed each day are whole grains.  Replace breads and cereals made from refined flour or white flour with whole-grain breads and cereals.  Replace white rice with brown rice, bulgur wheat, or millet.  Start the day with a breakfast that is high in fiber, such as a cereal that contains at least 5 grams of fiber per serving.  Use beans in place of meat in soups, salads, or pasta.  Eat high-fiber snacks, such as berries, raw vegetables, nuts, or popcorn.   This information is not intended to replace advice given to you by your health care provider. Make sure you discuss any  questions you have with your health care provider.   Document Released: 01/31/2005 Document Revised: 02/21/2014 Document Reviewed: 07/16/2013 Elsevier Interactive Patient Education [employer]

## 2015-08-28 NOTE — Op Note (Signed)
[facility] Patient Name: Donald Everett Procedure Date: 08/28/2015 9:52 AM MRN: [id number] Date of Birth: November 11, 1940 Attending MD: Lionel December , MD CSN: [id number] Age: 75 Admit Type: Outpatient Procedure:                Colonoscopy Indications:              Screening for colorectal malignant neoplasm Providers:                Lionel December, MD, Edrick Kins, RN, Lollie Marrow.                            Val Eagle, Technician Referring MD:             Milus Mallick. Simpson MD, Medicines:                Meperidine 50 mg IV, Midazolam 10 mg IV Complications:            No immediate complications. Estimated Blood Loss:     Estimated blood loss: none. Procedure:                Pre-Anesthesia Assessment:                           - Prior to the procedure, a History and Physical                            was performed, and patient medications and                            allergies were reviewed. The patient's tolerance of                            previous anesthesia was also reviewed. The risks                            and benefits of the procedure and the sedation                            options and risks were discussed with the patient.                            All questions were answered, and informed consent                            was obtained. Prior Anticoagulants: The patient                            last took aspirin 2 days prior to the procedure.                            ASA Grade Assessment: III - A patient with severe                            systemic disease. After reviewing the risks and  benefits, the patient was deemed in satisfactory                            condition to undergo the procedure.                           After obtaining informed consent, the colonoscope                            was passed under direct vision. Throughout the                            procedure, the patient's blood pressure, pulse, and                   oxygen saturations were monitored continuously. The                            [id number] ([id number]) was introduced through the                            anus and advanced to the the ascending colon. The                            colonoscopy was technically difficult and complex                            due to significant looping and a tortuous colon.                            Procedure was aided by changing the patient to a                            supine position, using manual pressure, withdrawing                            and reinserting the scope and applying abdominal                            pressure but cecum not reached.. The patient                            tolerated the procedure fairly well. The quality of                            the bowel preparation was adequate. The rectum was                            photographed. Scope In: 10:31:02 AM Scope Out: 11:09:18 AM Total Procedure Duration: 0 hours 38 minutes 16 seconds  Findings:      Multiple small and large-mouthed diverticula were found in the sigmoid       colon, descending colon, splenic flexure, transverse colon, hepatic       flexure and ascending colon.      External hemorrhoids  were found during retroflexion. The hemorrhoids       were small. Impression:               - Incomplete exam to a ascending colon secondary to                            tortuous long colon.                           - Diverticulosis in the sigmoid colon, in the                            descending colon, at the splenic flexure, in the                            transverse colon, at the hepatic flexure and in the                            ascending colon.                           - External hemorrhoids.                           - No specimens collected. Moderate Sedation:      Moderate (conscious) sedation was administered by the endoscopy nurse       and supervised by the endoscopist. The following  parameters were       monitored: oxygen saturation, heart rate, blood pressure, CO2       capnography and response to care. Total physician intraservice time was       42 minutes. Recommendation:           - Patient has a contact number available for                            emergencies. The signs and symptoms of potential                            delayed complications were discussed with the                            patient. Return to normal activities tomorrow.                            Written discharge instructions were provided to the                            patient.                           - Resume previous diet today.                           - Continue present medications.                           -  Resume aspirin at prior dose today.                           - Perform a virtual colonoscopy in 1 month. Procedure Code(s):        --- Professional ---                           [phone], 53, Colonoscopy, flexible; diagnostic,                            including collection of specimen(s) by brushing or                            washing, when performed (separate procedure)                           99152, Moderate sedation services provided by the                            same physician or other qualified health care                            professional performing the diagnostic or                            therapeutic service that the sedation supports,                            requiring the presence of an independent trained                            observer to assist in the monitoring of the                            patient's level of consciousness and physiological                            status; initial 15 minutes of intraservice time,                            patient age 90 years or older                           [phone], Moderate sedation services; each additional                            15 minutes intraservice time                            99153, Moderate sedation services; each additional                            15 minutes intraservice time Diagnosis Code(s):        --- Professional ---  Z12.11, Encounter for screening for malignant                            neoplasm of colon                           K64.4, Residual hemorrhoidal skin tags                           K57.30, Diverticulosis of large intestine without                            perforation or abscess without bleeding CPT copyright 2016 American Medical Association. All rights reserved. The codes documented in this report are preliminary and upon coder review may  be revised to meet current compliance requirements. Lionel December, MD Lionel December, MD 08/28/2015 11:20:54 AM This report has been signed electronically. Number of Addenda: 0

## 2015-08-28 NOTE — H&P (Signed)
Donald Everett is an 75 y.o. male.   Chief Complaint: Patient is here for colonoscopy. HPI: Patient is 75 year old Caucasian male who is here for colonoscopy primarily for screening. It appears his last exam was more than 10 years ago. He has been losing weight. He states he has lost 13 pounds last year. He has chronic constipation. Using MiraLAX 3-4 times a week. He denies abdominal pain or rectal bleeding. Family history is negative for CRC.  Past Medical History  Diagnosis Date  . Allergy   . Anxiety   . Hyperlipidemia   . Hypertension   . ED (erectile dysfunction)   . Nicotine addiction   . Chronic low back pain   . Insomnia   . COPD (chronic obstructive pulmonary disease) (HCC)   . Peripheral arterial disease (HCC)   . Renal artery stenosis (HCC)   . Type II diabetes mellitus (HCC)   . GERD (gastroesophageal reflux disease)   . Arthritis     "joints" (02/25/2013)  . Depression     Past Surgical History  Procedure Laterality Date  . Femoral artery stent Right 02/25/2013  . Lower extremity angiogram Bilateral 02/25/2013    Procedure: LOWER EXTREMITY ANGIOGRAM;  Surgeon: Runell Gess, MD;  Location: [facility] CATH LAB;  Service: Cardiovascular;  Laterality: Bilateral;  . Colonoscopy      Family History  Problem Relation Age of Onset  . Diabetes Mother   . Diabetes Father   . Stroke Father   . Diabetes Sister    Social History:  reports that he has been smoking Cigarettes.  He has a 57 pack-year smoking history. He has quit using smokeless tobacco. His smokeless tobacco use included Chew. He reports that he drinks about 8.4 oz of alcohol per week. He reports that he does not use illicit drugs.  Allergies: No Known Allergies  Medications Prior to Admission  Medication Sig Dispense Refill  . ADVAIR DISKUS 100-50 MCG/DOSE AEPB INHALE 1 INHALATION EVERY 12 HOURS. 180 each 0  . ALPRAZolam (XANAX) 0.5 MG tablet Take 1 tablet (0.5 mg total) by mouth at bedtime. 90 tablet 0  .  amLODipine (NORVASC) 10 MG tablet Take 1 tablet (10 mg total) by mouth daily. 90 tablet 1  . aspirin EC 325 MG EC tablet Take 1 tablet (325 mg total) by mouth daily. 30 tablet 0  . B Complex-C (B-COMPLEX WITH VITAMIN C) tablet Take 1 tablet by mouth daily.    . fluticasone (FLONASE) 50 MCG/ACT nasal spray Place 1 spray into both nostrils daily. 16 g 4  . gabapentin (NEURONTIN) 300 MG capsule TAKE ONE CAPSULES BY MOUTH 2 TIMES A DAY AND 2 AT BEDTIME. 360 capsule 1  . HYDROcodone-acetaminophen (NORCO) 7.5-325 MG tablet Take 1 tablet by mouth 2 (two) times daily as needed. 60 tablet 0  . insulin aspart protamine- aspart (NOVOLOG MIX 70/30) (70-30) 100 UNIT/ML injection Inject 0.1 mLs (10 Units total) into the skin 2 (two) times daily with a meal. (Patient taking differently: Inject 10 Units into the skin 2 (two) times daily with a meal. 25 in am and 10 at night.) 30 mL 0  . JANUVIA 100 MG tablet TAKE ONE TABLET BY MOUTH DAILY. (Patient taking differently: TAKE ONE TABLET BY MOUTH DAILY at 12 pm (if needed; if glucose running high)) 90 tablet 1  . losartan (COZAAR) 100 MG tablet Take 1 tablet (100 mg total) by mouth daily. 90 tablet 1  . metFORMIN (GLUCOPHAGE) 1000 MG tablet TAKE ONE TABLET BY  MOUTH TWICE DAILY WITH A MEAL. 180 tablet 1  . multivitamin-lutein (OCUVITE-LUTEIN) CAPS capsule Take 1 capsule by mouth daily.    . pantoprazole (PROTONIX) 40 MG tablet TAKE ONE TABLET BY MOUTH DAILY. 90 tablet 0  . polyethylene glycol (MIRALAX / GLYCOLAX) packet Take 17 g by mouth daily as needed for mild constipation. Mix with 8 oz of water.    . pravastatin (PRAVACHOL) 20 MG tablet Take 1 tablet (20 mg total) by mouth every morning. 90 tablet 1  . spironolactone (ALDACTONE) 50 MG tablet Take 1 tablet (50 mg total) by mouth 2 (two) times daily. 180 tablet 1  . tamsulosin (FLOMAX) 0.4 MG CAPS capsule Take 1 capsule (0.4 mg total) by mouth daily. 90 capsule 1  . temazepam (RESTORIL) 15 MG capsule TAKE ONE CAPSULE  BY MOUTH AT BEDTIME AS NEEDED FOR SLEEP. 90 capsule 0    Results for orders placed or performed during the hospital encounter of 08/28/15 (from the past 48 hour(s))  Glucose, capillary     Status: Abnormal   Collection Time: 08/28/15  9:29 AM  Result Value Ref Range   Glucose-Capillary 182 (H) 65 - 99 mg/dL   Comment 1 DOCUMENT IN  CHART    No results found.  ROS  Blood pressure 159/66, pulse 68, temperature 99.2 F (37.3 C), temperature source Oral, resp. rate 19, height 5\' 9"  (1.753 m), weight 137 lb (62.143 kg), SpO2 99 %. Physical Exam  Constitutional:  Well-developed thin Caucasian male in NAD.  HENT:  Mouth/Throat: Oropharynx is clear and moist.  Eyes: Conjunctivae are normal. No scleral icterus.  Neck: No thyromegaly present.  Cardiovascular: Normal rate, regular rhythm and normal heart sounds.   No murmur heard. Respiratory: Effort normal and breath sounds normal.  GI:  Abdomen is scaphoid soft and nontender without organomegaly or masses.  Musculoskeletal: He exhibits no edema.  Lymphadenopathy:    He has no cervical adenopathy.  Neurological: He is alert.  Skin: Skin is warm and dry.     Assessment/Plan Average risk screening colonoscopy.  Lionel DecemberNajeeb Mackinsey Pelland, MD 08/28/2015, 10:12 AM

## 2015-08-31 ENCOUNTER — Other Ambulatory Visit: Payer: Self-pay | Admitting: Family Medicine

## 2015-09-01 ENCOUNTER — Other Ambulatory Visit (INDEPENDENT_AMBULATORY_CARE_PROVIDER_SITE_OTHER): Payer: Self-pay | Admitting: Internal Medicine

## 2015-09-01 ENCOUNTER — Encounter (HOSPITAL_COMMUNITY): Payer: Self-pay | Admitting: Internal Medicine

## 2015-09-01 DIAGNOSIS — K562 Volvulus: Secondary | ICD-10-CM

## 2015-09-14 ENCOUNTER — Ambulatory Visit (INDEPENDENT_AMBULATORY_CARE_PROVIDER_SITE_OTHER): Payer: PPO

## 2015-09-14 ENCOUNTER — Encounter (INDEPENDENT_AMBULATORY_CARE_PROVIDER_SITE_OTHER): Payer: Self-pay

## 2015-09-14 VITALS — Wt 136.0 lb

## 2015-09-14 DIAGNOSIS — D519 Vitamin B12 deficiency anemia, unspecified: Secondary | ICD-10-CM | POA: Diagnosis not present

## 2015-09-14 MED ORDER — CYANOCOBALAMIN 1000 MCG/ML IJ SOLN
1000.0000 ug | Freq: Once | INTRAMUSCULAR | Status: AC
  Administered 2015-09-14: 1000 ug via INTRAMUSCULAR

## 2015-09-14 NOTE — Progress Notes (Signed)
Patient in for nurse visit B12 injection maintenance dose.  Injection given as ordered.  No voiced complaints.  Patient will keep next scheduled office visit.

## 2015-10-02 ENCOUNTER — Other Ambulatory Visit: Payer: Self-pay | Admitting: Family Medicine

## 2015-10-02 ENCOUNTER — Other Ambulatory Visit: Payer: Self-pay

## 2015-10-02 MED ORDER — HYDROCODONE-ACETAMINOPHEN 7.5-325 MG PO TABS: 1.0000 | ORAL_TABLET | Freq: Two times a day (BID) | ORAL | 0 refills | Status: DC | PRN

## 2015-10-12 ENCOUNTER — Ambulatory Visit
Admission: RE | Admit: 2015-10-12 | Discharge: 2015-10-12 | Disposition: A | Discharge status: Home / Self Care | Payer: PPO | Source: Ambulatory Visit | Attending: Internal Medicine | Admitting: Internal Medicine

## 2015-10-12 DIAGNOSIS — K562 Volvulus: Secondary | ICD-10-CM

## 2015-10-12 DIAGNOSIS — K6389 Other specified diseases of intestine: Secondary | ICD-10-CM | POA: Diagnosis not present

## 2015-10-14 ENCOUNTER — Ambulatory Visit (INDEPENDENT_AMBULATORY_CARE_PROVIDER_SITE_OTHER): Payer: PPO

## 2015-10-14 VITALS — Wt 135.0 lb

## 2015-10-14 DIAGNOSIS — D519 Vitamin B12 deficiency anemia, unspecified: Secondary | ICD-10-CM

## 2015-10-14 DIAGNOSIS — Z23 Encounter for immunization: Secondary | ICD-10-CM | POA: Diagnosis not present

## 2015-10-14 MED ORDER — CYANOCOBALAMIN 1000 MCG/ML IJ SOLN
1000.0000 ug | Freq: Once | INTRAMUSCULAR | Status: AC
  Administered 2015-10-14: 1000 ug via INTRAMUSCULAR

## 2015-10-30 ENCOUNTER — Other Ambulatory Visit: Payer: Self-pay

## 2015-10-30 MED ORDER — HYDROCODONE-ACETAMINOPHEN 7.5-325 MG PO TABS: 1.0000 | ORAL_TABLET | Freq: Two times a day (BID) | ORAL | 0 refills | Status: DC | PRN

## 2015-11-12 ENCOUNTER — Other Ambulatory Visit: Payer: Self-pay | Admitting: Family Medicine

## 2015-11-12 ENCOUNTER — Telehealth: Payer: Self-pay

## 2015-11-12 ENCOUNTER — Ambulatory Visit (INDEPENDENT_AMBULATORY_CARE_PROVIDER_SITE_OTHER): Payer: PPO

## 2015-11-12 VITALS — Wt 137.0 lb

## 2015-11-12 DIAGNOSIS — E538 Deficiency of other specified B group vitamins: Secondary | ICD-10-CM

## 2015-11-12 DIAGNOSIS — E1140 Type 2 diabetes mellitus with diabetic neuropathy, unspecified: Secondary | ICD-10-CM

## 2015-11-12 DIAGNOSIS — I150 Renovascular hypertension: Secondary | ICD-10-CM

## 2015-11-12 DIAGNOSIS — E785 Hyperlipidemia, unspecified: Secondary | ICD-10-CM

## 2015-11-12 DIAGNOSIS — N521 Erectile dysfunction due to diseases classified elsewhere: Secondary | ICD-10-CM

## 2015-11-12 MED ORDER — CYANOCOBALAMIN 1000 MCG/ML IJ SOLN
1000.0000 ug | Freq: Once | INTRAMUSCULAR | Status: AC
  Administered 2015-11-12: 1000 ug via INTRAMUSCULAR

## 2015-11-12 NOTE — Telephone Encounter (Signed)
Labs ordered.

## 2015-11-20 DIAGNOSIS — E1140 Type 2 diabetes mellitus with diabetic neuropathy, unspecified: Secondary | ICD-10-CM | POA: Diagnosis not present

## 2015-11-20 DIAGNOSIS — N521 Erectile dysfunction due to diseases classified elsewhere: Secondary | ICD-10-CM | POA: Diagnosis not present

## 2015-11-20 DIAGNOSIS — E785 Hyperlipidemia, unspecified: Secondary | ICD-10-CM | POA: Diagnosis not present

## 2015-11-20 LAB — COMPLETE METABOLIC PANEL WITH GFR
ALBUMIN: 3.6 g/dL (ref 3.6–5.1)
ALK PHOS: 44 U/L (ref 40–115)
ALT: 14 U/L (ref 9–46)
AST: 27 U/L (ref 10–35)
BILIRUBIN TOTAL: 0.3 mg/dL (ref 0.2–1.2)
BUN: 24 mg/dL (ref 7–25)
CALCIUM: 9.1 mg/dL (ref 8.6–10.3)
CO2: 25 mmol/L (ref 20–31)
CREATININE: 1.34 mg/dL — AB (ref 0.70–1.18)
Chloride: 102 mmol/L (ref 98–110)
GFR, Est African American: 59 mL/min — ABNORMAL LOW (ref >=60)
GFR, Est Non African American: 51 mL/min — ABNORMAL LOW (ref >=60)
Glucose, Bld: 145 mg/dL — ABNORMAL HIGH (ref 65–99)
POTASSIUM: 4.9 mmol/L (ref 3.5–5.3)
Sodium: 136 mmol/L (ref 135–146)
TOTAL PROTEIN: 6.1 g/dL (ref 6.1–8.1)

## 2015-11-20 LAB — LIPID PANEL
CHOLESTEROL: 125 mg/dL (ref 125–200)
HDL: 26 mg/dL — ABNORMAL LOW (ref >=40)
LDL Cholesterol: 75 mg/dL (ref <130)
Total CHOL/HDL Ratio: 4.8 Ratio (ref <=5.0)
Triglycerides: 119 mg/dL (ref <150)
VLDL: 24 mg/dL (ref <30)

## 2015-11-20 LAB — HEMOGLOBIN A1C
HEMOGLOBIN A1C: 6.9 % — AB (ref <5.7)
MEAN PLASMA GLUCOSE: 151 mg/dL

## 2015-11-25 ENCOUNTER — Encounter: Payer: Self-pay | Admitting: Family Medicine

## 2015-11-25 ENCOUNTER — Ambulatory Visit (INDEPENDENT_AMBULATORY_CARE_PROVIDER_SITE_OTHER): Payer: PPO | Admitting: Family Medicine

## 2015-11-25 VITALS — BP 148/64 | HR 70 | Resp 14 | Ht 69.0 in | Wt 138.1 lb

## 2015-11-25 DIAGNOSIS — Z794 Long term (current) use of insulin: Secondary | ICD-10-CM

## 2015-11-25 DIAGNOSIS — F419 Anxiety disorder, unspecified: Secondary | ICD-10-CM | POA: Diagnosis not present

## 2015-11-25 DIAGNOSIS — F17200 Nicotine dependence, unspecified, uncomplicated: Secondary | ICD-10-CM

## 2015-11-25 DIAGNOSIS — F411 Generalized anxiety disorder: Secondary | ICD-10-CM

## 2015-11-25 DIAGNOSIS — M5440 Lumbago with sciatica, unspecified side: Secondary | ICD-10-CM

## 2015-11-25 DIAGNOSIS — E785 Hyperlipidemia, unspecified: Secondary | ICD-10-CM

## 2015-11-25 DIAGNOSIS — F5105 Insomnia due to other mental disorder: Secondary | ICD-10-CM

## 2015-11-25 DIAGNOSIS — I150 Renovascular hypertension: Secondary | ICD-10-CM

## 2015-11-25 DIAGNOSIS — E119 Type 2 diabetes mellitus without complications: Secondary | ICD-10-CM | POA: Diagnosis not present

## 2015-11-25 DIAGNOSIS — IMO0001 Reserved for inherently not codable concepts without codable children: Secondary | ICD-10-CM

## 2015-11-25 MED ORDER — METFORMIN HCL 1000 MG PO TABS: 1000.0000 mg | ORAL_TABLET | Freq: Every day | ORAL | 3 refills | Status: DC

## 2015-11-25 NOTE — Patient Instructions (Addendum)
Annual wellness in December, call if you need me before  MD f/u in 3.5 month  eXCELLENT labs   Thankful colonoscopy is good  Reduce cigarettes, need to quit, start at 18 per day as of tomorrow  Reduce metformin to one daily  Lab order will be sent for your 3.5 month f/u with me  Thank you  for choosing [facility]. We consider it a privelige to serve you.  Delivering excellent health care in a caring and  compassionate way is our goal.  Partnering with you,  so that together we can achieve this goal is our strategy.

## 2015-11-28 ENCOUNTER — Encounter: Payer: Self-pay | Admitting: Family Medicine

## 2015-11-28 NOTE — Assessment & Plan Note (Signed)
cpontrolled and possibly over corrected Reduce metformin dose [name] is reminded of the importance of commitment to daily physical activity for 30 minutes or more, as able and the need to limit carbohydrate intake to 30 to 60 grams per meal to help with blood sugar control.   The need to take medication as prescribed, test blood sugar as directed, and to call between visits if there is a concern that blood sugar is uncontrolled is also discussed.   [name] is reminded of the importance of daily foot exam, annual eye examination, and good blood sugar, blood pressure and cholesterol control.  Diabetic Labs Latest Ref Rng & Units 11/20/2015 08/05/2015 07/31/2015 03/31/2015 01/22/2015  HbA1c <5.7 % 6.9(H) - 7.1(H) 7.0(H) -  Microalbumin Not estab mg/dL - 2.2(H) - - -  Micro/Creat Ratio <30 mcg/mg creat - 34(H) - - -  Chol 125 - 200 mg/dL 161125 - - 096127 -  HDL >=04>=40 mg/dL 54(U26(L) - - 98(J29(L) -  Calc LDL <130 mg/dL 75 - - 67 -  Triglycerides <150 mg/dL 191119 - - 478(G156(H) -  Creatinine 0.70 - 1.18 mg/dL 9.56(O1.34(H) - 1.301.18 8.65(H1.41(H) 1.29(H)   BP/Weight 11/25/2015 11/12/2015 10/14/2015 09/14/2015 08/28/2015 08/19/2015 08/05/2015  Systolic BP 148 - - - 136 122 [id number]  Diastolic BP 64 - - - 61 50 60  Wt. (Lbs) 138.12 137 135 136 137 137 140.12  BMI 20.4 20.23 19.94 20.08 20.22 20.22 21.31   Foot/eye exam completion dates 12/16/2014 01/22/2014  Foot Form Completion Done Done

## 2015-11-28 NOTE — Assessment & Plan Note (Signed)
Sub optimal control, no med change DASH diet and commitment to daily physical activity for a minimum of 30 minutes discussed and encouraged, as a part of hypertension management. The importance of attaining a healthy weight is also discussed.  BP/Weight 11/25/2015 11/12/2015 10/14/2015 09/14/2015 08/28/2015 08/19/2015 08/05/2015  Systolic BP 148 - - - 136 122 [id number]  Diastolic BP 64 - - - 61 50 60  Wt. (Lbs) 138.12 137 135 136 137 137 140.12  BMI 20.4 20.23 19.94 20.08 20.22 20.22 21.31

## 2015-11-28 NOTE — Progress Notes (Signed)
Donald CancelWilliam L Everett     MRN: [id number]      DOB: 02/04/1941   HPI [name] is here for follow up and re-evaluation of chronic medical conditions, medication management and review of any available recent lab and radiology data.  Preventive health is updated, specifically  Cancer screening and Immunization.   Questions or concerns regarding consultations or procedures which the PT has had in the interim are  addressed. The PT denies any adverse reactions to current medications since the last visit.  There are no new concerns.  States that if only he had no back or lower extremity pain, all would be well. Had colonoscopy with great report Denies polyuria, polydipsia, blurred vision , or hypoglycemic episodes.   ROS Denies recent fever or chills. Denies sinus pressure, nasal congestion, ear pain or sore throat. Denies chest congestion, productive cough or wheezing. Denies chest pains, palpitations and leg swelling Denies abdominal pain, nausea, vomiting,diarrhea or constipation.   Denies dysuria, frequency, hesitancy or incontinence.  Denies headaches, seizures,  Denies depression, uncontrolled  anxiety or insomnia. Denies skin break down or rash.   PE  BP (!) 156/84 (BP Location: Left Arm, Patient Position: Sitting, Cuff Size: Normal)   Pulse 70   Resp 14   Ht 5\' 9"  (1.753 m)   Wt 138 lb 1.9 oz (62.7 kg)   SpO2 97%   BMI 20.40 kg/m   Patient alert and oriented and in no cardiopulmonary distress.  HEENT: No facial asymmetry, EOMI,   oropharynx pink and moist.  Neck supple no JVD, no mass.  Chest: Clear to auscultation bilaterally.  CVS: S1, S2 no murmurs, no S3.Regular rate.  ABD: Soft non tender.   Ext: No edema  MS: Adequate ROM spine, shoulders, hips and knees.  Skin: Intact, no ulcerations or rash noted.  Psych: Good eye contact, normal affect. Memory intact not anxious or depressed appearing.  CNS: CN 2-12 intact, power,  normal throughout.no focal deficits  noted.   Assessment & Plan Diabetes mellitus, insulin dependent (IDDM), controlled (HCC) cpontrolled and possibly over corrected Reduce metformin dose [name] is reminded of the importance of commitment to daily physical activity for 30 minutes or more, as able and the need to limit carbohydrate intake to 30 to 60 grams per meal to help with blood sugar control.   The need to take medication as prescribed, test blood sugar as directed, and to call between visits if there is a concern that blood sugar is uncontrolled is also discussed.   [name] is reminded of the importance of daily foot exam, annual eye examination, and good blood sugar, blood pressure and cholesterol control.  Diabetic Labs Latest Ref Rng & Units 11/20/2015 08/05/2015 07/31/2015 03/31/2015 01/22/2015  HbA1c <5.7 % 6.9(H) - 7.1(H) 7.0(H) -  Microalbumin Not estab mg/dL - 2.2(H) - - -  Micro/Creat Ratio <30 mcg/mg creat - 34(H) - - -  Chol 125 - 200 mg/dL 829125 - - 562127 -  HDL >=13>=40 mg/dL 08(M26(L) - - 57(Q29(L) -  Calc LDL <130 mg/dL 75 - - 67 -  Triglycerides <150 mg/dL 469119 - - 629(B156(H) -  Creatinine 0.70 - 1.18 mg/dL 2.84(X1.34(H) - 3.241.18 4.01(U1.41(H) 1.29(H)   BP/Weight 11/25/2015 11/12/2015 10/14/2015 09/14/2015 08/28/2015 08/19/2015 08/05/2015  Systolic BP 148 - - - 136 122 272134  Diastolic BP 64 - - - 61 50 60  Wt. (Lbs) 138.12 137 135 136 137 137 140.12  BMI 20.4 20.23 19.94 20.08 20.22 20.22 21.31  Foot/eye exam completion dates 12/16/2014 01/22/2014  Foot Form Completion Done Done        NICOTINE ADDICTION unchnaged at 1 PPD Patient counseled for approximately 5 minutes regarding the health risks of ongoing nicotine use, specifically all types of cancer, heart disease, stroke and respiratory failure. The options available for help with cessation ,the behavioral changes to assist the process, and the option to either gradully reduce usage  Or abruptly stop.is also discussed. Pt is also encouraged to set specific goals in number of  cigarettes used daily, as well as to set a quit date.     LOW BACK PAIN, CHRONIC Chronic and unchanged, reports poor control of neuropathic pain, no med change desired  Or implemented however  Insomnia secondary to anxiety Controlled, no change in medication Sleep hygiene reviewed and written information offered also. Prescription sent for  medication needed.   Hyperlipidemia LDL goal <100 Hyperlipidemia:Low fat diet discussed and encouraged.   Lipid Panel  Lab Results  Component Value Date   CHOL 125 11/20/2015   HDL 26 (L) 11/20/2015   LDLCALC 75 11/20/2015   TRIG 119 11/20/2015   CHOLHDL 4.8 11/20/2015   Controlled, no change in medication Encouraged to increase exercise commitment    GENERALIZED ANXIETY DISORDER Controlled, no change in medication   Renovascular hypertension Sub optimal control, no med change DASH diet and commitment to daily physical activity for a minimum of 30 minutes discussed and encouraged, as a part of hypertension management. The importance of attaining a healthy weight is also discussed.  BP/Weight 11/25/2015 11/12/2015 10/14/2015 09/14/2015 08/28/2015 08/19/2015 08/05/2015  Systolic BP 148 - - - 136 122 098  Diastolic BP 64 - - - 61 50 60  Wt. (Lbs) 138.12 137 135 136 137 137 140.12  BMI 20.4 20.23 19.94 20.08 20.22 20.22 21.31

## 2015-11-28 NOTE — Assessment & Plan Note (Signed)
Chronic and unchanged, reports poor control of neuropathic pain, no med change desired  Or implemented however

## 2015-11-28 NOTE — Assessment & Plan Note (Signed)
unchnaged at 1 PPD Patient counseled for approximately 5 minutes regarding the health risks of ongoing nicotine use, specifically all types of cancer, heart disease, stroke and respiratory failure. The options available for help with cessation ,the behavioral changes to assist the process, and the option to either gradully reduce usage  Or abruptly stop.is also discussed. Pt is also encouraged to set specific goals in number of cigarettes used daily, as well as to set a quit date.

## 2015-11-28 NOTE — Assessment & Plan Note (Signed)
Controlled, no change in medication  

## 2015-11-28 NOTE — Assessment & Plan Note (Signed)
Controlled, no change in medication Sleep hygiene reviewed and written information offered also. Prescription sent for  medication needed.  

## 2015-11-28 NOTE — Assessment & Plan Note (Signed)
Hyperlipidemia:Low fat diet discussed and encouraged.   Lipid Panel  Lab Results  Component Value Date   CHOL 125 11/20/2015   HDL 26 (L) 11/20/2015   LDLCALC 75 11/20/2015   TRIG 119 11/20/2015   CHOLHDL 4.8 11/20/2015   Controlled, no change in medication Encouraged to increase exercise commitment

## 2015-11-30 ENCOUNTER — Other Ambulatory Visit: Payer: Self-pay | Admitting: Family Medicine

## 2015-12-05 ENCOUNTER — Other Ambulatory Visit: Payer: Self-pay

## 2015-12-05 MED ORDER — HYDROCODONE-ACETAMINOPHEN 7.5-325 MG PO TABS: 1.0000 | ORAL_TABLET | Freq: Two times a day (BID) | ORAL | 0 refills | Status: DC | PRN

## 2015-12-10 ENCOUNTER — Ambulatory Visit (INDEPENDENT_AMBULATORY_CARE_PROVIDER_SITE_OTHER): Payer: PPO

## 2015-12-10 ENCOUNTER — Other Ambulatory Visit: Payer: Self-pay | Admitting: Family Medicine

## 2015-12-10 DIAGNOSIS — D519 Vitamin B12 deficiency anemia, unspecified: Secondary | ICD-10-CM

## 2015-12-10 MED ORDER — CYANOCOBALAMIN 1000 MCG/ML IJ SOLN
1000.0000 ug | Freq: Once | INTRAMUSCULAR | Status: AC
  Administered 2015-12-10: 1000 ug via INTRAMUSCULAR

## 2016-01-01 ENCOUNTER — Other Ambulatory Visit: Payer: Self-pay

## 2016-01-01 ENCOUNTER — Other Ambulatory Visit: Payer: Self-pay | Admitting: Family Medicine

## 2016-01-01 MED ORDER — PANTOPRAZOLE SODIUM 40 MG PO TBEC: 40.0000 mg | DELAYED_RELEASE_TABLET | Freq: Every day | ORAL | 0 refills | Status: DC

## 2016-01-01 MED ORDER — HYDROCODONE-ACETAMINOPHEN 7.5-325 MG PO TABS: 1.0000 | ORAL_TABLET | Freq: Two times a day (BID) | ORAL | 0 refills | Status: DC | PRN

## 2016-01-11 ENCOUNTER — Ambulatory Visit (INDEPENDENT_AMBULATORY_CARE_PROVIDER_SITE_OTHER): Payer: PPO

## 2016-01-11 DIAGNOSIS — D519 Vitamin B12 deficiency anemia, unspecified: Secondary | ICD-10-CM

## 2016-01-11 MED ORDER — CYANOCOBALAMIN 1000 MCG/ML IJ SOLN
1000.0000 ug | Freq: Once | INTRAMUSCULAR | Status: AC
  Administered 2016-01-11: 1000 ug via INTRAMUSCULAR

## 2016-01-11 NOTE — Progress Notes (Signed)
Patient in for injection.   B12 injection given as ordered.  No voiced complaints.  Will keep next appt of 12/12 for AWV.

## 2016-01-26 ENCOUNTER — Ambulatory Visit (INDEPENDENT_AMBULATORY_CARE_PROVIDER_SITE_OTHER): Payer: PPO

## 2016-01-26 VITALS — BP 132/58 | HR 73 | Temp 98.8°F | Resp 16 | Ht 69.0 in | Wt 138.0 lb

## 2016-01-26 DIAGNOSIS — Z0000 Encounter for general adult medical examination without abnormal findings: Secondary | ICD-10-CM

## 2016-01-26 NOTE — Patient Instructions (Addendum)
Health maintenance: Up to date on all immunizations   Abnormal screenings: none    Patient concerns: Weight loss   Nurse concerns: Weight loss, continue follow up with Dr. Lodema HongSimpson   Next PCP appt: 03/02/2016 at 1:30pm    Health Maintenance, Male A healthy lifestyle and preventative care can promote health and wellness.  Maintain regular health, dental, and eye exams.  Eat a healthy diet. Foods like vegetables, fruits, whole grains, low-fat dairy products, and lean protein foods contain the nutrients you need and are low in calories. Decrease your intake of foods high in solid fats, added sugars, and salt. Get information about a proper diet from your health care provider, if necessary.  Regular physical exercise is one of the most important things you can do for your health. Most adults should get at least 150 minutes of moderate-intensity exercise (any activity that increases your heart rate and causes you to sweat) each week. In addition, most adults need muscle-strengthening exercises on 2 or more days a week.   Maintain a healthy weight. The body mass index (BMI) is a screening tool to identify possible weight problems. It provides an estimate of body fat based on height and weight. Your health care provider can find your BMI and can help you achieve or maintain a healthy weight. For males 20 years and older:  A BMI below 18.5 is considered underweight.  A BMI of 18.5 to 24.9 is normal.  A BMI of 25 to 29.9 is considered overweight.  A BMI of 30 and above is considered obese.  Maintain normal blood lipids and cholesterol by exercising and minimizing your intake of saturated fat. Eat a balanced diet with plenty of fruits and vegetables. Blood tests for lipids and cholesterol should begin at age 10520 and be repeated every 5 years. If your lipid or cholesterol levels are high, you are over age 75, or you are at high risk for heart disease, you may need your cholesterol levels  checked more frequently.Ongoing high lipid and cholesterol levels should be treated with medicines if diet and exercise are not working.  If you smoke, find out from your health care provider how to quit. If you do not use tobacco, do not start.  Lung cancer screening is recommended for adults aged 55-80 years who are at high risk for developing lung cancer because of a history of smoking. A yearly low-dose CT scan of the lungs is recommended for people who have at least a 30-pack-year history of smoking and are current smokers or have quit within the past 15 years. A pack year of smoking is smoking an average of 1 pack of cigarettes a day for 1 year (for example, a 30-pack-year history of smoking could mean smoking 1 pack a day for 30 years or 2 packs a day for 15 years). Yearly screening should continue until the smoker has stopped smoking for at least 15 years. Yearly screening should be stopped for people who develop a health problem that would prevent them from having lung cancer treatment.  If you choose to drink alcohol, do not have more than 2 drinks per day. One drink is considered to be 12 oz (360 mL) of beer, 5 oz (150 mL) of wine, or 1.5 oz (45 mL) of liquor.  Avoid the use of street drugs. Do not share needles with anyone. Ask for help if you need support or instructions about stopping the use of drugs.  High blood pressure causes heart disease and increases  the risk of stroke. High blood pressure is more likely to develop in:  People who have blood pressure in the end of the normal range (100-139/85-89 mm Hg).  People who are overweight or obese.  People who are African American.  If you are 32-69 years of age, have your blood pressure checked every 3-5 years. If you are 42 years of age or older, have your blood pressure checked every year. You should have your blood pressure measured twice-once when you are at a hospital or clinic, and once when you are not at a hospital or clinic.  Record the average of the two measurements. To check your blood pressure when you are not at a hospital or clinic, you can use:  An automated blood pressure machine at a pharmacy.  A home blood pressure monitor.  If you are 61-93 years old, ask your health care provider if you should take aspirin to prevent heart disease.  Diabetes screening involves taking a blood sample to check your fasting blood sugar level. This should be done once every 3 years after age 70 if you are at a normal weight and without risk factors for diabetes. Testing should be considered at a younger age or be carried out more frequently if you are overweight and have at least 1 risk factor for diabetes.  Colorectal cancer can be detected and often prevented. Most routine colorectal cancer screening begins at the age of 69 and continues through age 58. However, your health care provider may recommend screening at an earlier age if you have risk factors for colon cancer. On a yearly basis, your health care provider may provide home test kits to check for hidden blood in the stool. A small camera at the end of a tube may be used to directly examine the colon (sigmoidoscopy or colonoscopy) to detect the earliest forms of colorectal cancer. Talk to your health care provider about this at age 40 when routine screening begins. A direct exam of the colon should be repeated every 5-10 years through age 63, unless early forms of precancerous polyps or small growths are found.  People who are at an increased risk for hepatitis B should be screened for this virus. You are considered at high risk for hepatitis B if:  You were born in a country where hepatitis B occurs often. Talk with your health care provider about which countries are considered high risk.  Your parents were born in a high-risk country and you have not received a shot to protect against hepatitis B (hepatitis B vaccine).  You have HIV or AIDS.  You use needles to  inject street drugs.  You live with, or have sex with, someone who has hepatitis B.  You are a man who has sex with other men (MSM).  You get hemodialysis treatment.  You take certain medicines for conditions like cancer, organ transplantation, and autoimmune conditions.  Hepatitis C blood testing is recommended for all people born from [birth year] through 1965 and any individual with known risk factors for hepatitis C.  Healthy men should no longer receive prostate-specific antigen (PSA) blood tests as part of routine cancer screening. Talk to your health care provider about prostate cancer screening.  Testicular cancer screening is not recommended for adolescents or adult males who have no symptoms. Screening includes self-exam, a health care provider exam, and other screening tests. Consult with your health care provider about any symptoms you have or any concerns you have about testicular cancer.  Practice  safe sex. Use condoms and avoid high-risk sexual practices to reduce the spread of sexually transmitted infections (STIs).  You should be screened for STIs, including gonorrhea and chlamydia if:  You are sexually active and are younger than 24 years.  You are older than 24 years, and your health care provider tells you that you are at risk for this type of infection.  Your sexual activity has changed since you were last screened, and you are at an increased risk for chlamydia or gonorrhea. Ask your health care provider if you are at risk.  If you are at risk of being infected with HIV, it is recommended that you take a prescription medicine daily to prevent HIV infection. This is called pre-exposure prophylaxis (PrEP). You are considered at risk if:  You are a man who has sex with other men (MSM).  You are a heterosexual man who is sexually active with multiple partners.  You take drugs by injection.  You are sexually active with a partner who has HIV.  Talk with your health care  provider about whether you are at high risk of being infected with HIV. If you choose to begin PrEP, you should first be tested for HIV. You should then be tested every 3 months for as long as you are taking PrEP.  Use sunscreen. Apply sunscreen liberally and repeatedly throughout the day. You should seek shade when your shadow is shorter than you. Protect yourself by wearing long sleeves, pants, a wide-brimmed hat, and sunglasses year round whenever you are outdoors.  Tell your health care provider of new moles or changes in moles, especially if there is a change in shape or color. Also, tell your health care provider if a mole is larger than the size of a pencil eraser.  A one-time screening for abdominal aortic aneurysm (AAA) and surgical repair of large AAAs by ultrasound is recommended for men aged 65-75 years who are current or former smokers.  Stay current with your vaccines (immunizations). This information is not intended to replace advice given to you by your health care provider. Make sure you discuss any questions you have with your health care provider. Document Released: 07/30/2007 Document Revised: 02/21/2014 Document Reviewed: 11/04/2014 Elsevier Interactive Patient Education  2017 [employer].   Steps to Quit Smoking Smoking tobacco can be bad for your health. It can also affect almost every organ in your body. Smoking puts you and people around you at risk for many serious long-lasting (chronic) diseases. Quitting smoking is hard, but it is one of the best things that you can do for your health. It is never too late to quit. What are the benefits of quitting smoking? When you quit smoking, you lower your risk for getting serious diseases and conditions. They can include:  Lung cancer or lung disease.  Heart disease.  Stroke.  Heart attack.  Not being able to have children (infertility).  Weak bones (osteoporosis) and broken bones (fractures). If you have coughing,  wheezing, and shortness of breath, those symptoms may get better when you quit. You may also get sick less often. If you are pregnant, quitting smoking can help to lower your chances of having a baby of low birth weight. What can I do to help me quit smoking? Talk with your doctor about what can help you quit smoking. Some things you can do (strategies) include:  Quitting smoking totally, instead of slowly cutting back how much you smoke over a period of time.  Going  to in-person counseling. You are more likely to quit if you go to many counseling sessions.  Using resources and support systems, such as:  Online chats with a Veterinary surgeoncounselor.  Phone quitlines.  Printed Materials engineerself-help materials.  Support groups or group counseling.  Text messaging programs.  Mobile phone apps or applications.  Taking medicines. Some of these medicines may have nicotine in them. If you are pregnant or breastfeeding, do not take any medicines to quit smoking unless your doctor says it is okay. Talk with your doctor about counseling or other things that can help you. Talk with your doctor about using more than one strategy at the same time, such as taking medicines while you are also going to in-person counseling. This can help make quitting easier. What things can I do to make it easier to quit? Quitting smoking might feel very hard at first, but there is a lot that you can do to make it easier. Take these steps:  Talk to your family and friends. Ask them to support and encourage you.  Call phone quitlines, reach out to support groups, or work with a Veterinary surgeoncounselor.  Ask people who smoke to not smoke around you.  Avoid places that make you want (trigger) to smoke, such as:  Bars.  Parties.  Smoke-break areas at work.  Spend time with people who do not smoke.  Lower the stress in your life. Stress can make you want to smoke. Try these things to help your stress:  Getting regular exercise.  Deep-breathing  exercises.  Yoga.  Meditating.  Doing a body scan. To do this, close your eyes, focus on one area of your body at a time from head to toe, and notice which parts of your body are tense. Try to relax the muscles in those areas.  Download or buy apps on your mobile phone or tablet that can help you stick to your quit plan. There are many free apps, such as QuitGuide from the [employer] developer(Centers for Disease Control and Prevention). You can find more support from smokefree.gov and other websites. This information is not intended to replace advice given to you by your health care provider. Make sure you discuss any questions you have with your health care provider. Document Released: 11/27/2008 Document Revised: 09/29/2015 Document Reviewed: 06/17/2014 Elsevier Interactive Patient Education  2017 [employer]

## 2016-01-26 NOTE — Progress Notes (Signed)
Subjective:   Donald Everett is a 75 y.o. male who presents for Medicare Annual/Subsequent preventive examination.  Review of Systems:  Cardiac Risk Factors include: advanced age (>3555men, 37>65 women);diabetes mellitus;smoking/ tobacco exposure;family history of premature cardiovascular disease;hypertension;male gender;sedentary lifestyle     Objective:    Vitals: BP (!) 132/58   Pulse 73   Temp 98.8 F (37.1 C) (Oral)   Resp 16   Ht 5\' 9"  (1.753 m)   Wt 138 lb 0.6 oz (62.6 kg)   SpO2 94%   BMI 20.38 kg/m   Body mass index is 20.38 kg/m.  Tobacco History  Smoking Status  . Current Every Day Smoker  . Packs/day: 1.00  . Years: 60.00  . Types: Cigarettes  Smokeless Tobacco  . Former NeurosurgeonUser  . Types: Chew    Comment: 1 pack per day      Ready to quit: No Counseling given: Yes   Past Medical History:  Diagnosis Date  . Allergy   . Anxiety   . Arthritis    "joints" (02/25/2013)  . Chronic low back pain   . COPD (chronic obstructive pulmonary disease) (HCC)   . Depression   . ED (erectile dysfunction)   . GERD (gastroesophageal reflux disease)   . Hyperlipidemia   . Hypertension   . Insomnia   . Nicotine addiction   . Peripheral arterial disease (HCC)   . Renal artery stenosis (HCC)   . Type II diabetes mellitus (HCC)    Past Surgical History:  Procedure Laterality Date  . COLONOSCOPY    . COLONOSCOPY N/A 08/28/2015   Procedure: COLONOSCOPY;  Surgeon: Malissa HippoNajeeb U Rehman, MD;  Location: [facility];  Service: Endoscopy;  Laterality: N/A;  10:15 - moved to 10:45 - Ann to notify pt  . FEMORAL ARTERY STENT Right 02/25/2013  . LOWER EXTREMITY ANGIOGRAM Bilateral 02/25/2013   Procedure: LOWER EXTREMITY ANGIOGRAM;  Surgeon: Runell GessJonathan J Berry, MD;  [facility];  Service: Cardiovascular;  Laterality: Bilateral;   Family History  Problem Relation Age of Onset  . Diabetes Mother   . Diabetes Father   . Stroke Father   . Diabetes Sister   . CAD Brother    CABG   History  Sexual Activity  . Sexual activity: No    Outpatient Encounter Prescriptions as of 01/26/2016  Medication Sig  . ADVAIR DISKUS 100-50 MCG/DOSE AEPB INHALE 1 INHALATION EVERY 12 HOURS.  Marland Kitchen. ALPRAZolam (XANAX) 0.5 MG tablet TAKE ONE TABLET BY MOUTH AT BEDTIME.  Marland Kitchen. amLODipine (NORVASC) 10 MG tablet TAKE ONE TABLET BY MOUTH DAILY.  Marland Kitchen. aspirin EC 325 MG EC tablet Take 1 tablet (325 mg total) by mouth daily.  . B Complex-C (B-COMPLEX WITH VITAMIN C) tablet Take 1 tablet by mouth daily.  . B-D ULTRAFINE III SHORT PEN 31G X 8 MM MISC USE AS DIRECTED.  . fluticasone (FLONASE) 50 MCG/ACT nasal spray Place 1 spray into both nostrils daily.  Marland Kitchen. gabapentin (NEURONTIN) 300 MG capsule TAKE ONE CAPSULES BY MOUTH 2 TIMES A DAY AND 2 AT BEDTIME.  Marland Kitchen. HYDROcodone-acetaminophen (NORCO) 7.5-325 MG tablet Take 1 tablet by mouth 2 (two) times daily as needed.  . insulin aspart protamine- aspart (NOVOLOG MIX 70/30) (70-30) 100 UNIT/ML injection Inject 0.1 mLs (10 Units total) into the skin 2 (two) times daily with a meal. (Patient taking differently: Inject 10 Units into the skin 2 (two) times daily with a meal. 25 in am and 10 at night.)  . JANUVIA 100  MG tablet TAKE ONE TABLET BY MOUTH DAILY.  Marland Kitchen. LANTUS SOLOSTAR 100 UNIT/ML Solostar Pen INJECT 7 UNITS UNDER THE SKIN EVERY MORNING (MAX 15 UNITS).  Marland Kitchen. losartan (COZAAR) 100 MG tablet TAKE ONE TABLET BY MOUTH DAILY.  . metFORMIN (GLUCOPHAGE) 1000 MG tablet Take 1 tablet (1,000 mg total) by mouth daily with breakfast.  . multivitamin-lutein (OCUVITE-LUTEIN) CAPS capsule Take 1 capsule by mouth daily.  . ONE TOUCH ULTRA TEST test strip USE TO TEST THREE TIMES DAILY.  . pantoprazole (PROTONIX) 40 MG tablet Take 1 tablet (40 mg total) by mouth daily.  . polyethylene glycol (MIRALAX / GLYCOLAX) packet Take 17 g by mouth daily as needed for mild constipation. Mix with 8 oz of water.  . pravastatin (PRAVACHOL) 20 MG tablet TAKE ONE TABLET BY MOUTH EVERY MORNING.    Marland Kitchen. spironolactone (ALDACTONE) 50 MG tablet TAKE ONE TABLET BY MOUTH TWICE DAILY.  . tamsulosin (FLOMAX) 0.4 MG CAPS capsule TAKE ONE CAPSULE BY MOUTH DAILY.  Marland Kitchen. temazepam (RESTORIL) 15 MG capsule TAKE ONE CAPSULE BY MOUTH AT BEDTIME AS NEEDED FOR SLEEP.   No facility-administered encounter medications on file as of 01/26/2016.     Activities of Daily Living In your present state of health, do you have any difficulty performing the following activities: 01/26/2016  Hearing? Y  Vision? N  Difficulty concentrating or making decisions? Y  Walking or climbing stairs? N  Dressing or bathing? N  Doing errands, shopping? N  Preparing Food and eating ? N  Using the Toilet? N  In the past six months, have you accidently leaked urine? N  Do you have problems with loss of bowel control? N  Managing your Medications? N  Managing your Finances? N  Housekeeping or managing your Housekeeping? N  Some recent data might be hidden    Patient Care Team: Kerri PerchesMargaret E Simpson, MD as PCP - General Jethro BolusMark Shapiro, MD as Consulting Physician (Ophthalmology) Malissa HippoNajeeb U Rehman, MD as Consulting Physician (Gastroenterology)   Assessment:    Exercise Activities and Dietary recommendations Current Exercise Habits: The patient does not participate in regular exercise at present  Goals    . Exercise 3x per week (30 min per time)          Starting 01/26/2016 patient would like to increase his exercise to 30 minutes a day 3 times a week.      Fall Risk Fall Risk  01/26/2016 12/16/2014 01/22/2014 10/23/2013 12/13/2012  Falls in the past year? No No No No No  Risk for fall due to : Impaired vision - - - -   Depression Screen PHQ 2/9 Scores 01/26/2016 12/16/2014 01/22/2014 12/13/2012  PHQ - 2 Score 0 0 0 1    Cognitive Function     6CIT Screen 01/26/2016  What Year? 0 points  What month? 0 points  What time? 0 points  Count back from 20 0 points  Months in reverse 0 points  Repeat phrase 2 points  Total  Score 2    Immunization History  Administered Date(s) Administered  . Influenza Split 11/08/2010, 11/11/2011  . Influenza Whole 11/17/2006  . Influenza,inj,Quad PF,36+ Mos 10/29/2012, 11/28/2013, 12/16/2014, 10/14/2015  . Pneumococcal Conjugate-13 01/22/2014  . Pneumococcal Polysaccharide-23 08/29/2003, 10/07/2010  . Td 08/31/2002  . Tdap 12/16/2014  . Zoster 10/07/2010   Screening Tests Health Maintenance  Topic Date Due  . OPHTHALMOLOGY EXAM  10/30/2013  . FOOT EXAM  12/22/2015  . HEMOGLOBIN A1C  05/20/2016  . TETANUS/TDAP  12/15/2024  .  COLONOSCOPY  10/11/2025  . INFLUENZA VACCINE  Completed  . ZOSTAVAX  Completed  . PNA vac Low Risk Adult  Completed      Plan:  I have personally reviewed and addressed the Medicare Annual Wellness questionnaire and have noted the following in the patient's chart:  A. Medical and social history B. Use of alcohol, tobacco or illicit drugs  C. Current medications and supplements D. Functional ability and status E.  Nutritional status F.  Physical activity G. Advance directives H. List of other physicians I.  Hospitalizations, surgeries, and ER visits in previous 12 months J.  Vitals K. Screenings to include hearing, vision, cognitive, depression L. Referrals and appointments - none  In addition, I have reviewed and discussed with patient certain preventive protocols, quality metrics, and best practice recommendations. A written personalized care plan for preventive services as well as general preventive health recommendations were provided to patient.  Signed,   Candis Shine, LPN Lead Nurse Health Advisor

## 2016-02-03 IMAGING — DX DG CHEST 2V
2 series · 2 of 2 positions shown · non-contrast
Comparison: 02/19/2013

CLINICAL DATA: Cough, shortness of breath, smoker, hypertension,
diabetes mellitus

EXAM:
CHEST  2 VIEW

[chest pa]
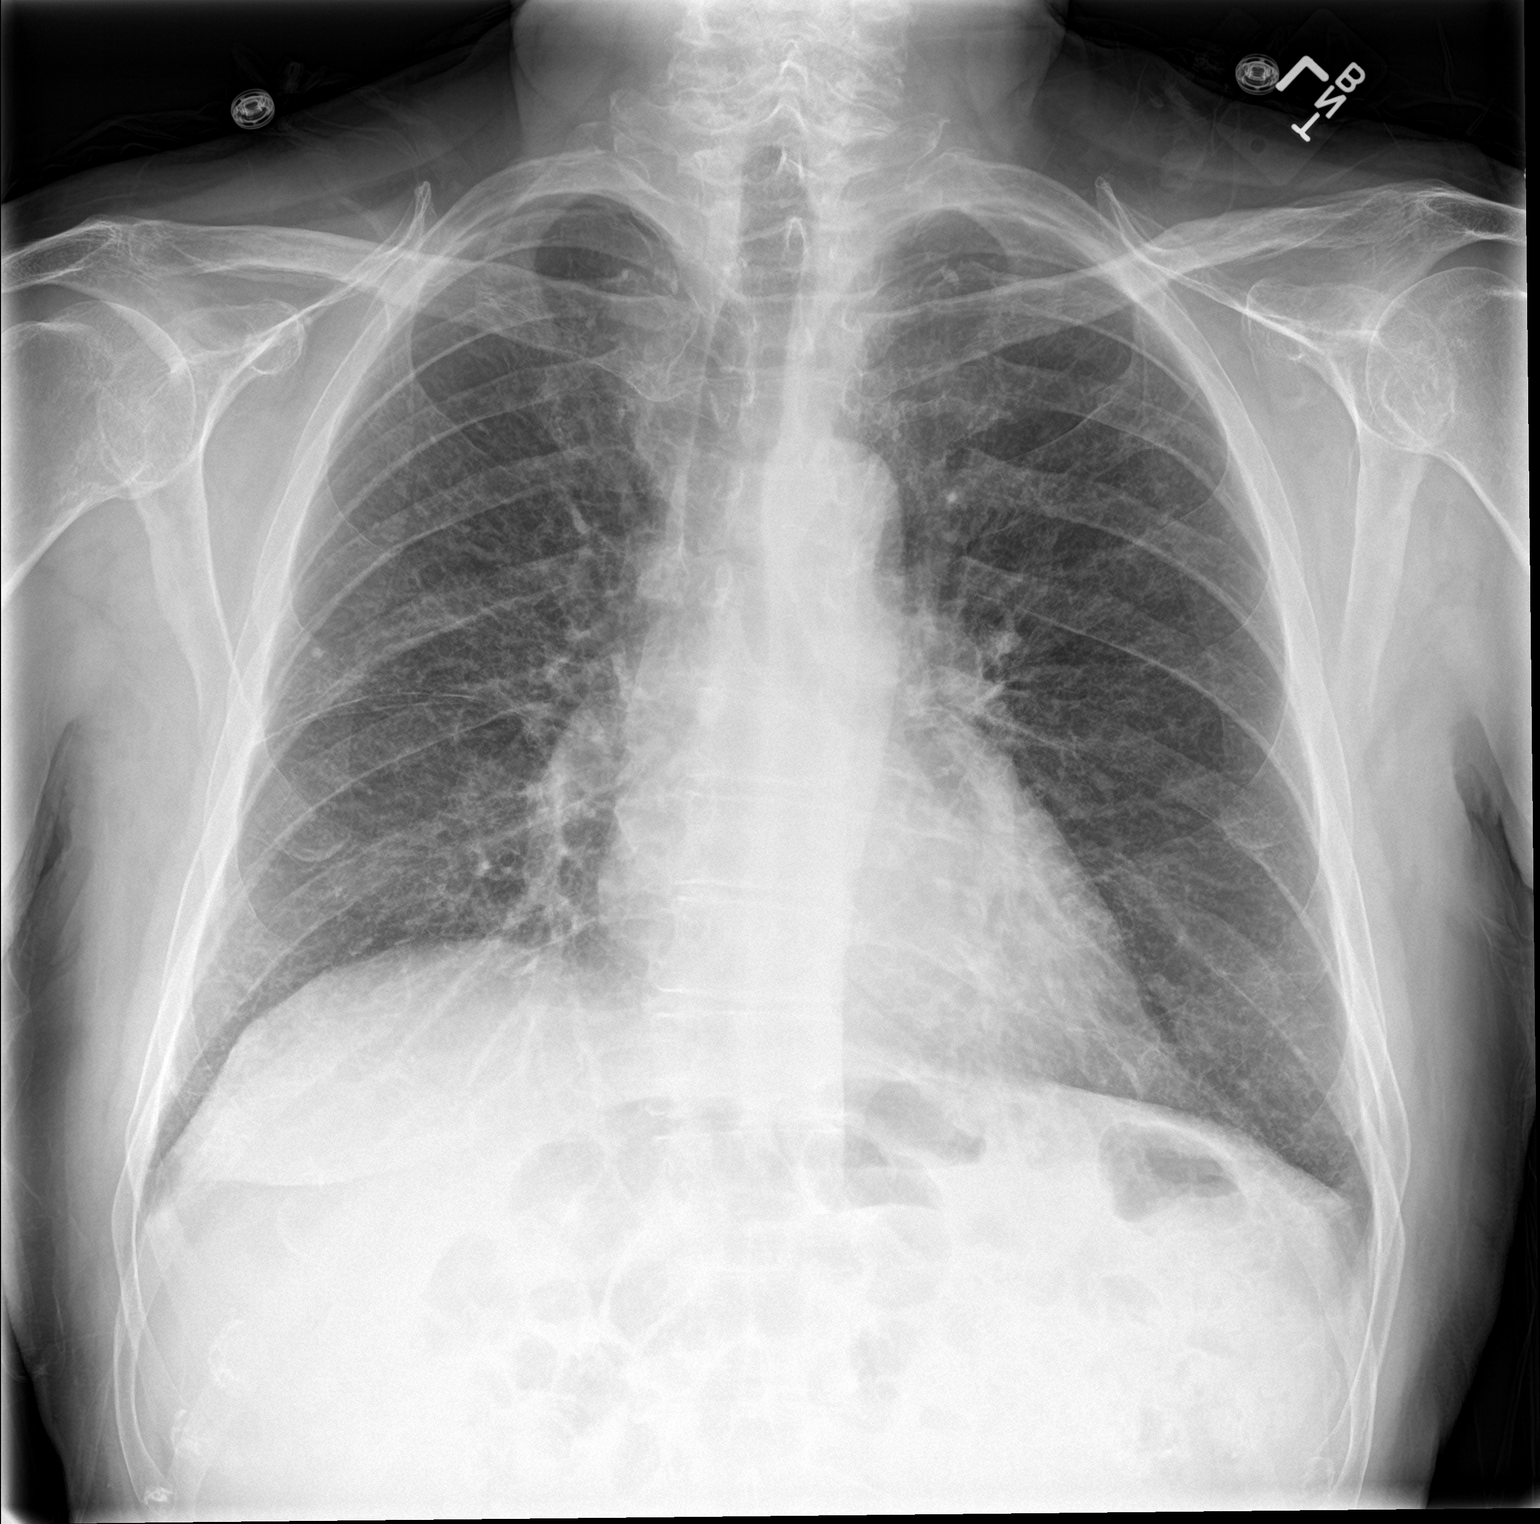

[chest lat]
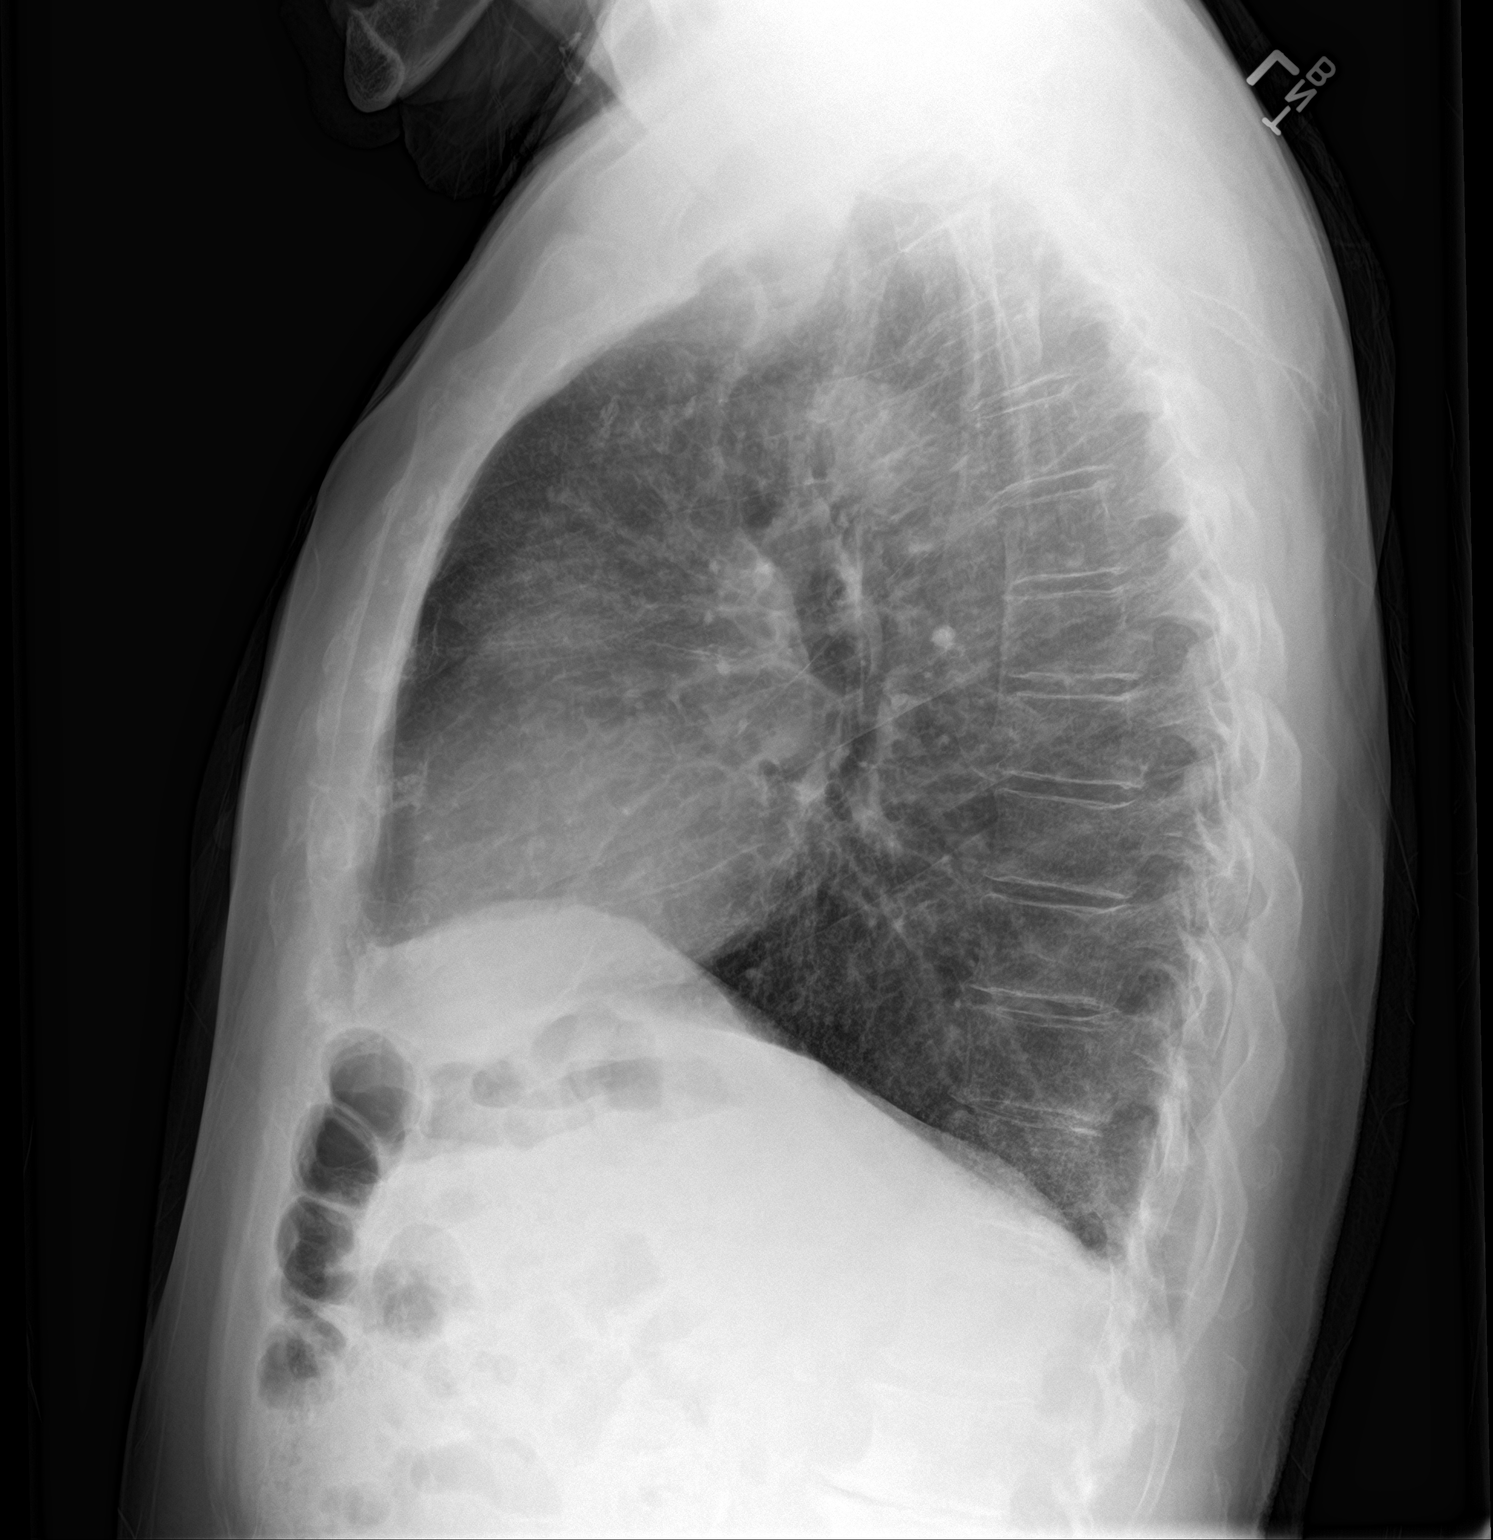

[2 of 2 positions shown; findings below may reference images not displayed]

FINDINGS: Normal heart size, mediastinal contours, and pulmonary vascularity.

Mild chronic bronchitic and interstitial changes grossly stable when
accounting for differences in technique.

No definite pulmonary infiltrate, pleural effusion or pneumothorax.

Tiny calcified granuloma RIGHT upper lobe stable.

Bones demineralized.
IMPRESSION: Chronic bronchitic and chronic interstitial lung disease changes.

No acute abnormalities.

## 2016-02-05 ENCOUNTER — Other Ambulatory Visit: Payer: Self-pay

## 2016-02-05 MED ORDER — HYDROCODONE-ACETAMINOPHEN 7.5-325 MG PO TABS: 1.0000 | ORAL_TABLET | Freq: Two times a day (BID) | ORAL | 0 refills | Status: DC | PRN

## 2016-02-09 ENCOUNTER — Ambulatory Visit (INDEPENDENT_AMBULATORY_CARE_PROVIDER_SITE_OTHER): Payer: PPO

## 2016-02-09 DIAGNOSIS — D519 Vitamin B12 deficiency anemia, unspecified: Secondary | ICD-10-CM | POA: Diagnosis not present

## 2016-02-09 MED ORDER — CYANOCOBALAMIN 1000 MCG/ML IJ SOLN
1000.0000 ug | Freq: Once | INTRAMUSCULAR | Status: AC
  Administered 2016-02-09: 1000 ug via INTRAMUSCULAR

## 2016-02-10 ENCOUNTER — Other Ambulatory Visit: Payer: Self-pay | Admitting: Family Medicine

## 2016-02-26 ENCOUNTER — Telehealth: Payer: Self-pay

## 2016-02-26 DIAGNOSIS — E119 Type 2 diabetes mellitus without complications: Principal | ICD-10-CM

## 2016-02-26 DIAGNOSIS — Z794 Long term (current) use of insulin: Principal | ICD-10-CM

## 2016-02-26 DIAGNOSIS — IMO0001 Reserved for inherently not codable concepts without codable children: Secondary | ICD-10-CM

## 2016-02-26 DIAGNOSIS — I150 Renovascular hypertension: Secondary | ICD-10-CM | POA: Diagnosis not present

## 2016-02-26 LAB — BASIC METABOLIC PANEL
BUN: 28 mg/dL — AB (ref 7–25)
CALCIUM: 9.1 mg/dL (ref 8.6–10.3)
CO2: 21 mmol/L (ref 20–31)
CREATININE: 1.57 mg/dL — AB (ref 0.70–1.18)
Chloride: 107 mmol/L (ref 98–110)
GLUCOSE: 98 mg/dL (ref 65–99)
Potassium: 5.1 mmol/L (ref 3.5–5.3)
Sodium: 136 mmol/L (ref 135–146)

## 2016-02-26 LAB — HEMOGLOBIN A1C
HEMOGLOBIN A1C: 6.9 % — AB (ref <5.7)
Mean Plasma Glucose: 151 mg/dL

## 2016-02-26 NOTE — Telephone Encounter (Signed)
Labs prior to upcoming appointment  

## 2016-03-01 ENCOUNTER — Other Ambulatory Visit: Payer: Self-pay | Admitting: Family Medicine

## 2016-03-02 ENCOUNTER — Ambulatory Visit: Payer: PPO | Admitting: Family Medicine

## 2016-03-11 ENCOUNTER — Ambulatory Visit (INDEPENDENT_AMBULATORY_CARE_PROVIDER_SITE_OTHER): Payer: PPO | Admitting: Family Medicine

## 2016-03-11 ENCOUNTER — Encounter: Payer: Self-pay | Admitting: Family Medicine

## 2016-03-11 VITALS — BP 116/48 | HR 52 | Temp 97.6°F | Resp 18 | Ht 69.0 in | Wt 146.0 lb

## 2016-03-11 DIAGNOSIS — N183 Chronic kidney disease, stage 3 unspecified: Secondary | ICD-10-CM

## 2016-03-11 DIAGNOSIS — F419 Anxiety disorder, unspecified: Secondary | ICD-10-CM

## 2016-03-11 DIAGNOSIS — E559 Vitamin D deficiency, unspecified: Secondary | ICD-10-CM | POA: Diagnosis not present

## 2016-03-11 DIAGNOSIS — E785 Hyperlipidemia, unspecified: Secondary | ICD-10-CM

## 2016-03-11 DIAGNOSIS — J449 Chronic obstructive pulmonary disease, unspecified: Secondary | ICD-10-CM

## 2016-03-11 DIAGNOSIS — F17210 Nicotine dependence, cigarettes, uncomplicated: Secondary | ICD-10-CM

## 2016-03-11 DIAGNOSIS — D519 Vitamin B12 deficiency anemia, unspecified: Secondary | ICD-10-CM

## 2016-03-11 DIAGNOSIS — E119 Type 2 diabetes mellitus without complications: Secondary | ICD-10-CM

## 2016-03-11 DIAGNOSIS — Z794 Long term (current) use of insulin: Secondary | ICD-10-CM | POA: Diagnosis not present

## 2016-03-11 DIAGNOSIS — F17200 Nicotine dependence, unspecified, uncomplicated: Secondary | ICD-10-CM

## 2016-03-11 DIAGNOSIS — I10 Essential (primary) hypertension: Secondary | ICD-10-CM | POA: Diagnosis not present

## 2016-03-11 DIAGNOSIS — F5105 Insomnia due to other mental disorder: Secondary | ICD-10-CM

## 2016-03-11 DIAGNOSIS — G8929 Other chronic pain: Secondary | ICD-10-CM | POA: Diagnosis not present

## 2016-03-11 DIAGNOSIS — Z125 Encounter for screening for malignant neoplasm of prostate: Secondary | ICD-10-CM | POA: Diagnosis not present

## 2016-03-11 DIAGNOSIS — IMO0001 Reserved for inherently not codable concepts without codable children: Secondary | ICD-10-CM

## 2016-03-11 DIAGNOSIS — E1140 Type 2 diabetes mellitus with diabetic neuropathy, unspecified: Secondary | ICD-10-CM

## 2016-03-11 DIAGNOSIS — N521 Erectile dysfunction due to diseases classified elsewhere: Principal | ICD-10-CM

## 2016-03-11 MED ORDER — HYDROCODONE-ACETAMINOPHEN 7.5-325 MG PO TABS: 1.0000 | ORAL_TABLET | Freq: Two times a day (BID) | ORAL | 0 refills | Status: DC | PRN

## 2016-03-11 MED ORDER — FLUTICASONE-SALMETEROL 100-50 MCG/DOSE IN AEPB: 1.0000 | INHALATION_SPRAY | Freq: Two times a day (BID) | RESPIRATORY_TRACT | 3 refills | Status: DC

## 2016-03-11 MED ORDER — CYANOCOBALAMIN 1000 MCG/ML IJ SOLN
1000.0000 ug | Freq: Once | INTRAMUSCULAR | Status: AC
  Administered 2016-03-11: 1000 ug via INTRAMUSCULAR

## 2016-03-11 MED ORDER — HYDROCODONE-ACETAMINOPHEN 7.5-325 MG PO TABS
1.0000 | ORAL_TABLET | Freq: Two times a day (BID) | ORAL | 0 refills | Status: DC | PRN
Start: 2016-03-11 — End: 2016-03-11

## 2016-03-11 NOTE — Patient Instructions (Addendum)
F/u in 3 month, call if you need me sooner  B12 today \ New pain policy as we discussed  STOP xanax  STOP metformin  Exam today qualifies you for shoes speak with pharmacist  Please work on stopping smoking  Fasting lipid, cmp and EGFR , hBA1c, cBC, pSA , TSH and vit D in 3 month

## 2016-03-12 NOTE — Assessment & Plan Note (Signed)
Hyperlipidemia:Low fat diet discussed and encouraged.   Lipid Panel  Lab Results  Component Value Date   CHOL 125 11/20/2015   HDL 26 (L) 11/20/2015   LDLCALC 75 11/20/2015   TRIG 119 11/20/2015   CHOLHDL 4.8 11/20/2015   Increased exercise encouraged Updated lab needed at/ before next visit.

## 2016-03-12 NOTE — Assessment & Plan Note (Signed)
Sleep hygiene reviewed and written information offered also. Prescription sent for  medication needed. Controlled, no change in medication  

## 2016-03-12 NOTE — Assessment & Plan Note (Addendum)
Deteriorating due to chronic nicotine use, encouraged to stop smoking, still smokes 1 PPD

## 2016-03-12 NOTE — Assessment & Plan Note (Signed)

## 2016-03-12 NOTE — Progress Notes (Signed)
Donald Everett     MRN: [id number]      DOB: 04/21/1940   HPI [name] is here for follow up and re-evaluation of chronic medical conditions, medication management in particular pain management, and review of any available recent lab and radiology data.  Preventive health is updated, specifically  Cancer screening and Immunization.   Questions or concerns regarding consultations or procedures which the PT has had in the interim are  addressed. The PT denies any adverse reactions to current medications since the last visit.  Denies polyuria, polydipsia, blurred vision , or hypoglycemic episodes. Requests script for diabetic shoes  ROS Denies recent fever or chills. Denies sinus pressure, nasal congestion, ear pain or sore throat. Denies chest congestion, productive cough or wheezing. Denies chest pains, palpitations and leg swelling Denies abdominal pain, nausea, vomiting,diarrhea or constipation.   Denies dysuria, frequency, hesitancy or incontinence. Denies joint pain, swelling and limitation in mobility. Denies headaches, seizures, numbness, or tingling. Denies depression, anxiety or insomnia. Denies skin break down or rash.   PE  BP (!) 116/48 (BP Location: Left Arm, Patient Position: Sitting, Cuff Size: Normal)   Pulse (!) 52   Temp 97.6 F (36.4 C) (Oral)   Resp 18   Ht 5\' 9"  (1.753 m)   Wt 146 lb (66.2 kg)   SpO2 97%   BMI 21.56 kg/m   Patient alert and oriented and in no cardiopulmonary distress.  HEENT: No facial asymmetry, EOMI,   oropharynx pink and moist.  Neck supple no JVD, no mass.  Chest: Clear to auscultation bilaterally.Decreased air entry bilaterally  CVS: S1, S2 no murmurs, no S3.Regular rate.  ABD: Soft non tender.   Ext: No edema  MS: Adequate though decreased  ROM spine, shoulders, hips and knees.  Skin: Intact, no ulcerations rash noted.  Psych: Good eye contact, normal affect. Memory intact not anxious or depressed appearing.  CNS:  CN 2-12 intact, power,  normal throughout.no focal deficits noted.   Assessment & Plan  Diabetes mellitus, insulin dependent (IDDM), controlled (HCC) Controlled well, d/c metformin due to CKD [name] is reminded of the importance of commitment to daily physical activity for 30 minutes or more, as able and the need to limit carbohydrate intake to 30 to 60 grams per meal to help with blood sugar control.   The need to take medication as prescribed, test blood sugar as directed, and to call between visits if there is a concern that blood sugar is uncontrolled is also discussed.   [name] is reminded of the importance of daily foot exam, annual eye examination, and good blood sugar, blood pressure and cholesterol control.  Diabetic Labs Latest Ref Rng & Units 02/26/2016 11/20/2015 08/05/2015 07/31/2015 03/31/2015  HbA1c <5.7 % 6.9(H) 6.9(H) - 7.1(H) 7.0(H)  Microalbumin Not estab mg/dL - - 2.2(H) - -  Micro/Creat Ratio <30 mcg/mg creat - - 34(H) - -  Chol 125 - 200 mg/dL - 119125 - - 147127  HDL >=82>=40 mg/dL - 95(A26(L) - - 21(H29(L)  Calc LDL <130 mg/dL - 75 - - 67  Triglycerides <150 mg/dL - 086119 - - 578(I156(H)  Creatinine 0.70 - 1.18 mg/dL 6.96(E1.57(H) 9.52(W1.34(H) - 4.131.18 1.41(H)   BP/Weight 03/11/2016 01/26/2016 11/25/2015 11/12/2015 10/14/2015 09/14/2015 08/28/2015  Systolic BP 116 132 148 - - - 244136  Diastolic BP 48 58 64 - - - 61  Wt. (Lbs) 146 138.04 138.12 137 135 136 137  BMI 21.56 20.38 20.4 20.23 19.94 20.08 20.22  Foot/eye exam completion dates 03/11/2016 12/16/2014  Foot Form Completion Done Done  Updated lab needed at/ before next visit.       B12 deficiency anemia 1 ml administered at visit  COPD (chronic obstructive pulmonary disease) Deteriorating due to chronic nicotine use, encouraged to stop smoking, still smokes 1 PPD  Insomnia secondary to anxiety Sleep hygiene reviewed and written information offered also. Prescription sent for  medication needed. Controlled, no change in  medication   Hyperlipidemia LDL goal <100 Hyperlipidemia:Low fat diet discussed and encouraged.   Lipid Panel  Lab Results  Component Value Date   CHOL 125 11/20/2015   HDL 26 (L) 11/20/2015   LDLCALC 75 11/20/2015   TRIG 119 11/20/2015   CHOLHDL 4.8 11/20/2015   Increased exercise encouraged Updated lab needed at/ before next visit.     NICOTINE ADDICTION Patient counseled for approximately 5 minutes regarding the health risks of ongoing nicotine use, specifically all types of cancer, heart disease, stroke and respiratory failure. The options available for help with cessation ,the behavioral changes to assist the process, and the option to either gradully reduce usage  Or abruptly stop.is also discussed. Pt is also encouraged to set specific goals in number of cigarettes used daily, as well as to set a quit date.     Encounter for chronic pain management Pain contract reviewed and signed. Pain management addressed in term of adequacy of control and opportunity to provide a more effective and safe medication management All questions were answered , and scripts for 3 months provided

## 2016-03-12 NOTE — Assessment & Plan Note (Signed)
Controlled well, d/c metformin due to CKD [name] is reminded of the importance of commitment to daily physical activity for 30 minutes or more, as able and the need to limit carbohydrate intake to 30 to 60 grams per meal to help with blood sugar control.   The need to take medication as prescribed, test blood sugar as directed, and to call between visits if there is a concern that blood sugar is uncontrolled is also discussed.   [name] is reminded of the importance of daily foot exam, annual eye examination, and good blood sugar, blood pressure and cholesterol control.  Diabetic Labs Latest Ref Rng & Units 02/26/2016 11/20/2015 08/05/2015 07/31/2015 03/31/2015  HbA1c <5.7 % 6.9(H) 6.9(H) - 7.1(H) 7.0(H)  Microalbumin Not estab mg/dL - - 2.2(H) - -  Micro/Creat Ratio <30 mcg/mg creat - - 34(H) - -  Chol 125 - 200 mg/dL - 829125 - - 562127  HDL >=13>=40 mg/dL - 08(M26(L) - - 57(Q29(L)  Calc LDL <130 mg/dL - 75 - - 67  Triglycerides <150 mg/dL - 469119 - - 629(B156(H)  Creatinine 0.70 - 1.18 mg/dL 2.84(X1.57(H) 3.24(M1.34(H) - 0.101.18 1.41(H)   BP/Weight 03/11/2016 01/26/2016 11/25/2015 11/12/2015 10/14/2015 09/14/2015 08/28/2015  Systolic BP 116 132 148 - - - 272136  Diastolic BP 48 58 64 - - - 61  Wt. (Lbs) 146 138.04 138.12 137 135 136 137  BMI 21.56 20.38 20.4 20.23 19.94 20.08 20.22   Foot/eye exam completion dates 03/11/2016 12/16/2014  Foot Form Completion Done Done  Updated lab needed at/ before next visit.

## 2016-03-12 NOTE — Assessment & Plan Note (Signed)
1 ml administered at visit

## 2016-03-12 NOTE — Assessment & Plan Note (Signed)
Pain contract reviewed and signed. Pain management addressed in term of adequacy of control and opportunity to provide a more effective and safe medication management All questions were answered , and scripts for 3 months provided

## 2016-03-31 ENCOUNTER — Other Ambulatory Visit: Payer: Self-pay | Admitting: Family Medicine

## 2016-04-11 ENCOUNTER — Ambulatory Visit (INDEPENDENT_AMBULATORY_CARE_PROVIDER_SITE_OTHER): Payer: PPO

## 2016-04-11 ENCOUNTER — Other Ambulatory Visit: Payer: Self-pay | Admitting: Family Medicine

## 2016-04-11 DIAGNOSIS — D519 Vitamin B12 deficiency anemia, unspecified: Secondary | ICD-10-CM | POA: Diagnosis not present

## 2016-04-11 MED ORDER — CYANOCOBALAMIN 1000 MCG/ML IJ SOLN
1000.0000 ug | Freq: Once | INTRAMUSCULAR | Status: AC
  Administered 2016-04-11: 1000 ug via INTRAMUSCULAR

## 2016-04-20 DIAGNOSIS — E119 Type 2 diabetes mellitus without complications: Secondary | ICD-10-CM | POA: Diagnosis not present

## 2016-04-20 DIAGNOSIS — H2513 Age-related nuclear cataract, bilateral: Secondary | ICD-10-CM | POA: Diagnosis not present

## 2016-04-20 DIAGNOSIS — H43812 Vitreous degeneration, left eye: Secondary | ICD-10-CM | POA: Diagnosis not present

## 2016-04-20 DIAGNOSIS — Z794 Long term (current) use of insulin: Secondary | ICD-10-CM | POA: Diagnosis not present

## 2016-04-20 DIAGNOSIS — H353130 Nonexudative age-related macular degeneration, bilateral, stage unspecified: Secondary | ICD-10-CM | POA: Diagnosis not present

## 2016-05-09 ENCOUNTER — Ambulatory Visit (INDEPENDENT_AMBULATORY_CARE_PROVIDER_SITE_OTHER): Payer: PPO

## 2016-05-09 ENCOUNTER — Other Ambulatory Visit: Payer: Self-pay | Admitting: Family Medicine

## 2016-05-09 DIAGNOSIS — D519 Vitamin B12 deficiency anemia, unspecified: Secondary | ICD-10-CM | POA: Diagnosis not present

## 2016-05-09 MED ORDER — CYANOCOBALAMIN 1000 MCG/ML IJ SOLN
1000.0000 ug | INTRAMUSCULAR | Status: DC
  Administered 2016-05-10 – 2016-08-15 (×2): 1000 ug via INTRAMUSCULAR

## 2016-05-10 DIAGNOSIS — D519 Vitamin B12 deficiency anemia, unspecified: Secondary | ICD-10-CM | POA: Diagnosis not present

## 2016-05-10 MED ORDER — CYANOCOBALAMIN 1000 MCG/ML IJ SOLN: 1000.0000 ug | Freq: Once | INTRAMUSCULAR | Status: DC

## 2016-05-10 NOTE — Progress Notes (Signed)
Med given with no complications in left deltoid

## 2016-05-16 DIAGNOSIS — E559 Vitamin D deficiency, unspecified: Secondary | ICD-10-CM | POA: Diagnosis not present

## 2016-05-16 DIAGNOSIS — I10 Essential (primary) hypertension: Secondary | ICD-10-CM | POA: Diagnosis not present

## 2016-05-16 DIAGNOSIS — N521 Erectile dysfunction due to diseases classified elsewhere: Secondary | ICD-10-CM | POA: Diagnosis not present

## 2016-05-16 DIAGNOSIS — E1140 Type 2 diabetes mellitus with diabetic neuropathy, unspecified: Secondary | ICD-10-CM | POA: Diagnosis not present

## 2016-05-16 DIAGNOSIS — E785 Hyperlipidemia, unspecified: Secondary | ICD-10-CM | POA: Diagnosis not present

## 2016-05-16 DIAGNOSIS — N183 Chronic kidney disease, stage 3 (moderate): Secondary | ICD-10-CM | POA: Diagnosis not present

## 2016-05-16 DIAGNOSIS — D519 Vitamin B12 deficiency anemia, unspecified: Secondary | ICD-10-CM | POA: Diagnosis not present

## 2016-05-16 DIAGNOSIS — Z125 Encounter for screening for malignant neoplasm of prostate: Secondary | ICD-10-CM | POA: Diagnosis not present

## 2016-05-16 LAB — CBC
HEMATOCRIT: 36.1 % — AB (ref 38.5–50.0)
HEMOGLOBIN: 11.6 g/dL — AB (ref 13.2–17.1)
MCH: 29.5 pg (ref 27.0–33.0)
MCHC: 32.1 g/dL (ref 32.0–36.0)
MCV: 91.9 fL (ref 80.0–100.0)
MPV: 10.6 fL (ref 7.5–12.5)
Platelets: 212 10*3/uL (ref 140–400)
RBC: 3.93 MIL/uL — AB (ref 4.20–5.80)
RDW: 14.8 % (ref 11.0–15.0)
WBC: 11.4 10*3/uL — AB (ref 3.8–10.8)

## 2016-05-17 ENCOUNTER — Other Ambulatory Visit: Payer: Self-pay

## 2016-05-17 ENCOUNTER — Other Ambulatory Visit: Payer: Self-pay | Admitting: Family Medicine

## 2016-05-17 LAB — LIPID PANEL
Cholesterol: 97 mg/dL (ref <200)
HDL: 27 mg/dL — ABNORMAL LOW (ref >40)
LDL Cholesterol: 48 mg/dL (ref <100)
TRIGLYCERIDES: 112 mg/dL (ref <150)
Total CHOL/HDL Ratio: 3.6 Ratio (ref <5.0)
VLDL: 22 mg/dL (ref <30)

## 2016-05-17 LAB — HEMOGLOBIN A1C
Hgb A1c MFr Bld: 6.6 % — ABNORMAL HIGH (ref <5.7)
Mean Plasma Glucose: 143 mg/dL

## 2016-05-17 LAB — COMPLETE METABOLIC PANEL WITH GFR
ALBUMIN: 3.8 g/dL (ref 3.6–5.1)
ALT: 15 U/L (ref 9–46)
AST: 22 U/L (ref 10–35)
Alkaline Phosphatase: 51 U/L (ref 40–115)
BUN: 35 mg/dL — AB (ref 7–25)
CALCIUM: 9.2 mg/dL (ref 8.6–10.3)
CHLORIDE: 108 mmol/L (ref 98–110)
CO2: 23 mmol/L (ref 20–31)
CREATININE: 2.01 mg/dL — AB (ref 0.70–1.18)
GFR, Est African American: 36 mL/min — ABNORMAL LOW (ref >=60)
GFR, Est Non African American: 32 mL/min — ABNORMAL LOW (ref >=60)
Glucose, Bld: 129 mg/dL — ABNORMAL HIGH (ref 65–99)
Potassium: 5.8 mmol/L — ABNORMAL HIGH (ref 3.5–5.3)
Sodium: 138 mmol/L (ref 135–146)
TOTAL PROTEIN: 6.4 g/dL (ref 6.1–8.1)
Total Bilirubin: 0.4 mg/dL (ref 0.2–1.2)

## 2016-05-17 LAB — PSA, TOTAL AND FREE
PSA, % FREE: 20 % — AB (ref >25)
PSA, Free: 0.1 ng/mL
PSA, Total: 0.5 ng/mL (ref <=4.0)

## 2016-05-17 LAB — TSH: TSH: 3.36 m[IU]/L (ref 0.40–4.50)

## 2016-05-17 LAB — VITAMIN D 25 HYDROXY (VIT D DEFICIENCY, FRACTURES): Vit D, 25-Hydroxy: 30 ng/mL (ref 30–100)

## 2016-05-17 MED ORDER — SODIUM POLYSTYRENE SULFONATE 15 GM/60ML PO SUSP: 30.0000 g | Freq: Once | ORAL | 1 refills | Status: AC

## 2016-05-17 MED ORDER — SODIUM POLYSTYRENE SULFONATE 15 GM/60ML PO SUSP: 30.0000 g | Freq: Once | ORAL | 1 refills | Status: DC

## 2016-05-18 ENCOUNTER — Telehealth: Payer: Self-pay

## 2016-05-18 DIAGNOSIS — E876 Hypokalemia: Secondary | ICD-10-CM

## 2016-05-18 NOTE — Telephone Encounter (Signed)
-----  Message from Fayrene Helper, MD sent at 05/17/2016 12:27 PM EDT ----- Pls call pt and let him know that his potassium is high and he needs to ensure he is drinking 64 oz water daily, he is dehydrated. I have prescribed kayexelate, needs to take today, and get rept non fast chem 7 and eGFR the day he sees me (not stat)  kEEP appt this week

## 2016-05-19 ENCOUNTER — Encounter: Payer: Self-pay | Admitting: Family Medicine

## 2016-05-19 ENCOUNTER — Ambulatory Visit (INDEPENDENT_AMBULATORY_CARE_PROVIDER_SITE_OTHER): Payer: PPO | Admitting: Family Medicine

## 2016-05-19 ENCOUNTER — Other Ambulatory Visit: Payer: Self-pay

## 2016-05-19 VITALS — BP 148/68 | HR 61 | Resp 16 | Ht 69.0 in | Wt 142.0 lb

## 2016-05-19 DIAGNOSIS — J449 Chronic obstructive pulmonary disease, unspecified: Secondary | ICD-10-CM | POA: Diagnosis not present

## 2016-05-19 DIAGNOSIS — I150 Renovascular hypertension: Secondary | ICD-10-CM

## 2016-05-19 DIAGNOSIS — Z794 Long term (current) use of insulin: Secondary | ICD-10-CM | POA: Diagnosis not present

## 2016-05-19 DIAGNOSIS — F17219 Nicotine dependence, cigarettes, with unspecified nicotine-induced disorders: Secondary | ICD-10-CM

## 2016-05-19 DIAGNOSIS — G8929 Other chronic pain: Secondary | ICD-10-CM

## 2016-05-19 DIAGNOSIS — E119 Type 2 diabetes mellitus without complications: Secondary | ICD-10-CM

## 2016-05-19 DIAGNOSIS — N521 Erectile dysfunction due to diseases classified elsewhere: Secondary | ICD-10-CM

## 2016-05-19 DIAGNOSIS — Z125 Encounter for screening for malignant neoplasm of prostate: Secondary | ICD-10-CM | POA: Diagnosis not present

## 2016-05-19 DIAGNOSIS — E1140 Type 2 diabetes mellitus with diabetic neuropathy, unspecified: Secondary | ICD-10-CM

## 2016-05-19 DIAGNOSIS — F17200 Nicotine dependence, unspecified, uncomplicated: Secondary | ICD-10-CM

## 2016-05-19 DIAGNOSIS — E785 Hyperlipidemia, unspecified: Secondary | ICD-10-CM | POA: Diagnosis not present

## 2016-05-19 DIAGNOSIS — IMO0001 Reserved for inherently not codable concepts without codable children: Secondary | ICD-10-CM

## 2016-05-19 LAB — BASIC METABOLIC PANEL
BUN: 30 mg/dL — AB (ref 7–25)
CO2: 20 mmol/L (ref 20–31)
CREATININE: 1.62 mg/dL — AB (ref 0.70–1.18)
Calcium: 8.6 mg/dL (ref 8.6–10.3)
Chloride: 102 mmol/L (ref 98–110)
Glucose, Bld: 238 mg/dL — ABNORMAL HIGH (ref 65–99)
Potassium: 4.6 mmol/L (ref 3.5–5.3)
Sodium: 132 mmol/L — ABNORMAL LOW (ref 135–146)

## 2016-05-19 MED ORDER — HYDROCODONE-ACETAMINOPHEN 7.5-325 MG PO TABS: 1.0000 | ORAL_TABLET | Freq: Two times a day (BID) | ORAL | 0 refills | Status: DC | PRN

## 2016-05-19 NOTE — Patient Instructions (Addendum)
f/u week of June 11 call if you need me sooner  Non fasting labs 3 to 5 days before visit  We will schedule chest Scan for screening in May, around an 11 am appt per your request and call you with the appt info   Congrats on excellent blood sugar   Steps to Quit Smoking Smoking tobacco can be bad for your health. It can also affect almost every organ in your body. Smoking puts you and people around you at risk for many serious long-lasting (chronic) diseases. Quitting smoking is hard, but it is one of the best things that you can do for your health. It is never too late to quit. What are the benefits of quitting smoking? When you quit smoking, you lower your risk for getting serious diseases and conditions. They can include:  Lung cancer or lung disease.  Heart disease.  Stroke.  Heart attack.  Not being able to have children (infertility).  Weak bones (osteoporosis) and broken bones (fractures). If you have coughing, wheezing, and shortness of breath, those symptoms may get better when you quit. You may also get sick less often. If you are pregnant, quitting smoking can help to lower your chances of having a baby of low birth weight. What can I do to help me quit smoking? Talk with your doctor about what can help you quit smoking. Some things you can do (strategies) include:  Quitting smoking totally, instead of slowly cutting back how much you smoke over a period of time.  Going to in-person counseling. You are more likely to quit if you go to many counseling sessions.  Using resources and support systems, such as:  Online chats with a Veterinary surgeon.  Phone quitlines.  Printed Materials engineer.  Support groups or group counseling.  Text messaging programs.  Mobile phone apps or applications.  Taking medicines. Some of these medicines may have nicotine in them. If you are pregnant or breastfeeding, do not take any medicines to quit smoking unless your doctor says it is  okay. Talk with your doctor about counseling or other things that can help you. Talk with your doctor about using more than one strategy at the same time, such as taking medicines while you are also going to in-person counseling. This can help make quitting easier. What things can I do to make it easier to quit? Quitting smoking might feel very hard at first, but there is a lot that you can do to make it easier. Take these steps:  Talk to your family and friends. Ask them to support and encourage you.  Call phone quitlines, reach out to support groups, or work with a Veterinary surgeon.  Ask people who smoke to not smoke around you.  Avoid places that make you want (trigger) to smoke, such as:  Bars.  Parties.  Smoke-break areas at work.  Spend time with people who do not smoke.  Lower the stress in your life. Stress can make you want to smoke. Try these things to help your stress:  Getting regular exercise.  Deep-breathing exercises.  Yoga.  Meditating.  Doing a body scan. To do this, close your eyes, focus on one area of your body at a time from head to toe, and notice which parts of your body are tense. Try to relax the muscles in those areas.  Download or buy apps on your mobile phone or tablet that can help you stick to your quit plan. There are many free apps, such as QuitGuide  from the CDC [facility] for Disease Control and Prevention). You can find more support from smokefree.gov and other websites. This information is not intended to replace advice given to you by your health care provider. Make sure you discuss any questions you have with your health care provider. Document Released: 11/27/2008 Document Revised: 09/29/2015 Document Reviewed: 06/17/2014 Elsevier Interactive Patient Education  2017 [employer].

## 2016-05-20 NOTE — Telephone Encounter (Signed)
Pt aware.

## 2016-05-22 ENCOUNTER — Encounter: Payer: Self-pay | Admitting: Family Medicine

## 2016-05-22 NOTE — Progress Notes (Signed)
Donald Everett     MRN: [id number]      DOB: 1941/02/05   HPI Donald Everett is here for follow up and re-evaluation of chronic medical conditions, medication management and review of any available recent lab and radiology data.  Preventive health is updated, specifically  Cancer screening and Immunization.   Questions or concerns regarding consultations or procedures which the PT has had in the interim are  addressed. The PT denies any adverse reactions to current medications since the last visit.  There are no new concerns.  Blood sugar varies significantly, however , he continues to mange to keep controlled , warned regarding hypoglycemia, lowest recorded is in the 70's  Denies polyuria, polydipsia, blurred vision , or hypoglycemic episodes.   ROS Denies recent fever or chills. Denies sinus pressure, nasal congestion, ear pain or sore throat. Denies chest congestion, productive cough or wheezing. Denies chest pains, palpitations and leg swelling Denies abdominal pain, nausea, vomiting,diarrhea or constipation.   Denies dysuria, frequency, hesitancy or incontinence. Denies uncontrolled  joint pain, swelling and limitation in mobility. Denies headaches, seizures, numbness, or tingling. Denies depression, anxiety or insomnia. Denies skin break down or rash.   PE  BP (!) 148/68   Pulse 61   Resp 16   Ht  (1.753 m)   Wt 142 lb (64.4 kg)   SpO2 97%   BMI 20.97 kg/m   Patient alert and oriented and in no cardiopulmonary distress.  HEENT: No facial asymmetry, EOMI,   oropharynx pink and moist.  Neck supple no JVD, no mass.  Chest: Clear to auscultation bilaterally.Decreased air entry bilaterally  CVS: S1, S2 no murmurs, no S3.Regular rate.  ABD: Soft non tender.   Ext: No edema  MS: Adequate though reduced  ROM spine, normal in shoulders, hips and knees.  Skin: Intact, no ulcerations or rash noted.  Psych: Good eye contact, normal affect. Memory intact not anxious  or depressed appearing.  CNS: CN 2-12 intact, power,  normal throughout.no focal deficits noted.   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   The need to take medication as prescribed, test blood sugar as directed, and to call between visits if there is a concern that blood sugar is uncontrolled is also discussed.   Donald Everett is reminded of the importance of daily foot exam, annual eye examination, and good blood sugar, blood pressure and cholesterol control.  Diabetic Labs Latest Ref Rng & Units 05/18/2016 05/16/2016 02/26/2016 11/20/2015 08/05/2015  HbA1c <5.7 % - 6.6(H) 6.9(H) 6.9(H) -  Microalbumin Not estab mg/dL - - - - 2.2(H)  Micro/Creat Ratio <30 mcg/mg creat - - - - 34(H)  Chol <200 mg/dL - 97 - 119 -  HDL >14 mg/dL - 78(G) - 95(A) -  Calc LDL <100 mg/dL - 48 - 75 -  Triglycerides <150 mg/dL - 213 - 086 -  Creatinine 0.70 - 1.18 mg/dL 5.78(I) 6.96(E) 9.52(W) 1.34(H) -   BP/Weight 05/19/2016 03/11/2016 01/26/2016 11/25/2015 11/12/2015 10/14/2015 09/14/2015  Systolic BP 148 116 132 148 - - -  Diastolic BP 68 48 58 64 - - -  Wt. (Lbs) 142 146 138.04 138.12 137 135 136  BMI 20.97 21.56 20.38  20.4 20.23 19.94 20.08   Foot/eye exam completion dates 03/11/2016 12/16/2014  Foot Form Completion Done Done        Encounter for chronic pain management Pain adequately controlled on current regime, no medication changes made Registry reviewed art visit and patient is compliant with treatment, 3 months of medication prescribed with appropriate follow up  Hyperlipidemia LDL goal <100 Hyperlipidemia:Low fat diet discussed and encouraged.   Lipid Panel  Lab Results  Component Value Date   CHOL  97 05/16/2016   HDL 27 (L) 05/16/2016   LDLCALC 48 05/16/2016   TRIG 112 05/16/2016   CHOLHDL 3.6 05/16/2016    Increased physical activity as tolerated is encouraged , low HDL   Renovascular hypertension Sub optimal , though adequate control,based on age and underlying pathology.  no med change, needs to quit smoking DASH diet and commitment to daily physical activity for a minimum of 30 minutes discussed and encouraged, as a part of hypertension management. The importance of attaining a healthy weight is also discussed.  BP/Weight 05/19/2016 03/11/2016 01/26/2016 11/25/2015 11/12/2015 10/14/2015 09/14/2015  Systolic BP 148 116 132 148 - - -  Diastolic BP 68 48 58 64 - - -  Wt. (Lbs) 142 146 138.04 138.12 137 135 136  BMI 20.97 21.56 20.38 20.4 20.23 19.94 20.08       NICOTINE ADDICTION Patient counseled for approximately 5 minutes regarding the health risks of ongoing nicotine use, specifically all types of cancer, heart disease, stroke and respiratory failure. The options available for help with cessation ,the behavioral changes to assist the process, and the option to either gradully reduce usage  Or abruptly stop.is also discussed. Pt is also encouraged to set specific goals in number of cigarettes used daily, as well as to set a quit date.     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

## 2016-05-22 NOTE — Assessment & Plan Note (Signed)

## 2016-05-22 NOTE — Assessment & Plan Note (Signed)
Hyperlipidemia:Low fat diet discussed and encouraged.   Lipid Panel  Lab Results  Component Value Date   CHOL 97 05/16/2016   HDL 27 (L) 05/16/2016   LDLCALC 48 05/16/2016   TRIG 112 05/16/2016   CHOLHDL 3.6 05/16/2016    Increased physical activity as tolerated is encouraged , low HDL

## 2016-05-22 NOTE — Assessment & Plan Note (Signed)
Sub optimal , though adequate control,based on age and underlying pathology.  no med change, needs to quit smoking DASH diet and commitment to daily physical activity for a minimum of 30 minutes discussed and encouraged, as a part of hypertension management. The importance of attaining a healthy weight is also discussed.  BP/Weight 05/19/2016 03/11/2016 01/26/2016 11/25/2015 11/12/2015 10/14/2015 09/14/2015  Systolic BP 148 116 132 148 - - -  Diastolic BP 68 48 58 64 - - -  Wt. (Lbs) 142 146 138.04 138.12 137 135 136  BMI 20.97 21.56 20.38 20.4 20.23 19.94 20.08

## 2016-05-22 NOTE — Assessment & Plan Note (Signed)
Pain adequately controlled on current regime, no medication changes made Registry reviewed art visit and patient is compliant with treatment, 3 months of medication prescribed with appropriate follow up

## 2016-05-22 NOTE — Assessment & Plan Note (Signed)
Controlled, no change in medication [name] is reminded of the importance of commitment to daily physical activity for 30 minutes or more, as able and the need to limit carbohydrate intake to 30 to 60 grams per meal to help with blood sugar control.   The need to take medication as prescribed, test blood sugar as directed, and to call between visits if there is a concern that blood sugar is uncontrolled is also discussed.   Donald Everett is reminded of the importance of daily foot exam, annual eye examination, and good blood sugar, blood pressure and cholesterol control.  Diabetic Labs Latest Ref Rng & Units 05/18/2016 05/16/2016 02/26/2016 11/20/2015 08/05/2015  HbA1c <5.7 % - 6.6(H) 6.9(H) 6.9(H) -  Microalbumin Not estab mg/dL - - - - 2.2(H)  Micro/Creat Ratio <30 mcg/mg creat - - - - 34(H)  Chol <200 mg/dL - 97 - 161 -  HDL >09 mg/dL - 60(A) - 54(U) -  Calc LDL <100 mg/dL - 48 - 75 -  Triglycerides <150 mg/dL - 981 - 191 -  Creatinine 0.70 - 1.18 mg/dL 4.78(G) 9.56(O) 1.30(Q) 1.34(H) -   BP/Weight 05/19/2016 03/11/2016 01/26/2016 11/25/2015 11/12/2015 10/14/2015 09/14/2015  Systolic BP 148 116 132 148 - - -  Diastolic BP 68 48 58 64 - - -  Wt. (Lbs) 142 146 138.04 138.12 137 135 136  BMI 20.97 21.56 20.38 20.4 20.23 19.94 20.08   Foot/eye exam completion dates 03/11/2016 12/16/2014  Foot Form Completion Done Done

## 2016-05-22 NOTE — Assessment & Plan Note (Signed)
Severe due to  prolonged and excessive nicotine use, continue maintenance meds

## 2016-05-24 ENCOUNTER — Ambulatory Visit: Payer: PPO | Admitting: Family Medicine

## 2016-05-30 ENCOUNTER — Other Ambulatory Visit: Payer: Self-pay | Admitting: Family Medicine

## 2016-06-08 ENCOUNTER — Ambulatory Visit (INDEPENDENT_AMBULATORY_CARE_PROVIDER_SITE_OTHER): Payer: PPO

## 2016-06-08 ENCOUNTER — Ambulatory Visit (HOSPITAL_COMMUNITY): Payer: PPO

## 2016-06-08 DIAGNOSIS — D519 Vitamin B12 deficiency anemia, unspecified: Secondary | ICD-10-CM | POA: Diagnosis not present

## 2016-06-08 MED ORDER — CYANOCOBALAMIN 1000 MCG/ML IJ SOLN
1000.0000 ug | Freq: Once | INTRAMUSCULAR | Status: AC
  Administered 2016-06-08: 1000 ug via INTRAMUSCULAR

## 2016-06-08 NOTE — Progress Notes (Signed)
In for monthly b12 injection. Given with no complications

## 2016-06-15 ENCOUNTER — Ambulatory Visit (HOSPITAL_COMMUNITY): Payer: PPO

## 2016-06-23 ENCOUNTER — Other Ambulatory Visit: Payer: Self-pay

## 2016-06-23 ENCOUNTER — Telehealth: Payer: Self-pay

## 2016-06-23 ENCOUNTER — Other Ambulatory Visit: Payer: Self-pay | Admitting: Family Medicine

## 2016-06-23 MED ORDER — INSULIN ASPART PROT & ASPART (70-30 MIX) 100 UNIT/ML ~~LOC~~ SUSP: 35.0000 [IU] | Freq: Two times a day (BID) | SUBCUTANEOUS | 0 refills | Status: DC

## 2016-06-23 NOTE — Telephone Encounter (Signed)
Donald Everett called stating his dose of novolog needs to be decreased?

## 2016-06-23 NOTE — Telephone Encounter (Signed)
Patient aware to take how he was advised and the rx was extra incase it had to be titrated

## 2016-06-30 ENCOUNTER — Other Ambulatory Visit: Payer: Self-pay | Admitting: Family Medicine

## 2016-07-06 ENCOUNTER — Ambulatory Visit (HOSPITAL_COMMUNITY)
Admission: RE | Admit: 2016-07-06 | Discharge: 2016-07-06 | Disposition: A | Discharge status: Home / Self Care | Payer: PPO | Source: Ambulatory Visit | Attending: Family Medicine | Admitting: Family Medicine

## 2016-07-06 DIAGNOSIS — F17219 Nicotine dependence, cigarettes, with unspecified nicotine-induced disorders: Secondary | ICD-10-CM | POA: Insufficient documentation

## 2016-07-06 DIAGNOSIS — I2510 Atherosclerotic heart disease of native coronary artery without angina pectoris: Secondary | ICD-10-CM | POA: Insufficient documentation

## 2016-07-06 DIAGNOSIS — Z122 Encounter for screening for malignant neoplasm of respiratory organs: Secondary | ICD-10-CM | POA: Diagnosis not present

## 2016-07-06 DIAGNOSIS — J439 Emphysema, unspecified: Secondary | ICD-10-CM | POA: Insufficient documentation

## 2016-07-06 DIAGNOSIS — F17210 Nicotine dependence, cigarettes, uncomplicated: Secondary | ICD-10-CM | POA: Diagnosis not present

## 2016-07-06 DIAGNOSIS — I700 Atherosclerosis of aorta: Secondary | ICD-10-CM | POA: Insufficient documentation

## 2016-07-07 ENCOUNTER — Telehealth: Payer: Self-pay

## 2016-07-07 NOTE — Telephone Encounter (Signed)
Pt called asking if he can come in at 1145 tomorrow for his b 12 shot?  cb [phone]

## 2016-07-07 NOTE — Telephone Encounter (Signed)
Told he has to be here no later than that time to get his injection

## 2016-07-08 ENCOUNTER — Ambulatory Visit (INDEPENDENT_AMBULATORY_CARE_PROVIDER_SITE_OTHER): Payer: PPO

## 2016-07-08 DIAGNOSIS — D519 Vitamin B12 deficiency anemia, unspecified: Secondary | ICD-10-CM | POA: Diagnosis not present

## 2016-07-08 MED ORDER — CYANOCOBALAMIN 1000 MCG/ML IJ SOLN
1000.0000 ug | Freq: Once | INTRAMUSCULAR | Status: AC
  Administered 2016-07-08: 1000 ug via INTRAMUSCULAR

## 2016-07-10 ENCOUNTER — Other Ambulatory Visit: Payer: Self-pay | Admitting: Family Medicine

## 2016-07-10 DIAGNOSIS — I2510 Atherosclerotic heart disease of native coronary artery without angina pectoris: Secondary | ICD-10-CM

## 2016-07-10 DIAGNOSIS — F17218 Nicotine dependence, cigarettes, with other nicotine-induced disorders: Secondary | ICD-10-CM

## 2016-07-10 DIAGNOSIS — E119 Type 2 diabetes mellitus without complications: Secondary | ICD-10-CM

## 2016-07-10 DIAGNOSIS — E785 Hyperlipidemia, unspecified: Secondary | ICD-10-CM

## 2016-07-10 DIAGNOSIS — IMO0001 Reserved for inherently not codable concepts without codable children: Secondary | ICD-10-CM

## 2016-07-10 DIAGNOSIS — E1169 Type 2 diabetes mellitus with other specified complication: Secondary | ICD-10-CM

## 2016-07-10 DIAGNOSIS — Z794 Long term (current) use of insulin: Secondary | ICD-10-CM

## 2016-07-12 ENCOUNTER — Telehealth: Payer: Self-pay | Admitting: Family Medicine

## 2016-07-20 DIAGNOSIS — Z794 Long term (current) use of insulin: Secondary | ICD-10-CM | POA: Diagnosis not present

## 2016-07-20 DIAGNOSIS — Z125 Encounter for screening for malignant neoplasm of prostate: Secondary | ICD-10-CM | POA: Diagnosis not present

## 2016-07-20 DIAGNOSIS — E119 Type 2 diabetes mellitus without complications: Secondary | ICD-10-CM | POA: Diagnosis not present

## 2016-07-20 LAB — BASIC METABOLIC PANEL WITH GFR
BUN: 33 mg/dL — ABNORMAL HIGH (ref 7–25)
CHLORIDE: 109 mmol/L (ref 98–110)
CO2: 23 mmol/L (ref 20–31)
CREATININE: 1.6 mg/dL — AB (ref 0.70–1.18)
Calcium: 8.9 mg/dL (ref 8.6–10.3)
GFR, Est African American: 48 mL/min — ABNORMAL LOW (ref >=60)
GFR, Est Non African American: 42 mL/min — ABNORMAL LOW (ref >=60)
Glucose, Bld: 136 mg/dL — ABNORMAL HIGH (ref 65–99)
Potassium: 6.1 mmol/L — ABNORMAL HIGH (ref 3.5–5.3)
SODIUM: 138 mmol/L (ref 135–146)

## 2016-07-20 LAB — PSA: PSA: 0.4 ng/mL (ref <=4.0)

## 2016-07-21 ENCOUNTER — Telehealth: Payer: Self-pay | Admitting: Family Medicine

## 2016-07-21 ENCOUNTER — Other Ambulatory Visit: Payer: Self-pay | Admitting: Family Medicine

## 2016-07-21 ENCOUNTER — Other Ambulatory Visit: Payer: Self-pay

## 2016-07-21 DIAGNOSIS — E876 Hypokalemia: Secondary | ICD-10-CM | POA: Insufficient documentation

## 2016-07-21 LAB — HEMOGLOBIN A1C
Hgb A1c MFr Bld: 6.9 % — ABNORMAL HIGH (ref <5.7)
Mean Plasma Glucose: 151 mg/dL

## 2016-07-21 MED ORDER — SODIUM POLYSTYRENE SULFONATE 15 GM/60ML PO SUSP: ORAL | 0 refills | Status: DC

## 2016-07-21 NOTE — Telephone Encounter (Signed)
pls let pt know  that his potassium is too high needs kayexalate today, and ept chem 7 in am to ensure the level is good, 2 doses have been prescribed this am

## 2016-07-21 NOTE — Telephone Encounter (Signed)
Patient aware and is having transport issues but will call us back if he is unable to collect the med today. Was made aware of the need to repeat his labs in the am but he said he did not have transportation for this

## 2016-07-21 NOTE — Addendum Note (Signed)
Addended by: Everitt AmberHUDY, Maeola Mchaney on: 07/21/2016 01:24 PM   Modules accepted: Orders

## 2016-07-21 NOTE — Progress Notes (Signed)
kayex 

## 2016-07-22 ENCOUNTER — Telehealth: Payer: Self-pay | Admitting: Family Medicine

## 2016-07-22 NOTE — Telephone Encounter (Signed)
Patient left a voicemail asking if he should take his medication today or take it Monday.   [phone]

## 2016-07-22 NOTE — Telephone Encounter (Signed)
Called patient this am and no answer. Called the pharmacy and the kayexelate still had not been picked up.

## 2016-07-25 ENCOUNTER — Ambulatory Visit (INDEPENDENT_AMBULATORY_CARE_PROVIDER_SITE_OTHER): Payer: PPO | Admitting: Family Medicine

## 2016-07-25 ENCOUNTER — Encounter: Payer: Self-pay | Admitting: Family Medicine

## 2016-07-25 VITALS — BP 158/70 | HR 73 | Resp 16 | Ht 69.0 in | Wt 144.0 lb

## 2016-07-25 DIAGNOSIS — E785 Hyperlipidemia, unspecified: Secondary | ICD-10-CM

## 2016-07-25 DIAGNOSIS — IMO0001 Reserved for inherently not codable concepts without codable children: Secondary | ICD-10-CM

## 2016-07-25 DIAGNOSIS — E119 Type 2 diabetes mellitus without complications: Secondary | ICD-10-CM

## 2016-07-25 DIAGNOSIS — I150 Renovascular hypertension: Secondary | ICD-10-CM | POA: Diagnosis not present

## 2016-07-25 DIAGNOSIS — Z794 Long term (current) use of insulin: Secondary | ICD-10-CM

## 2016-07-25 DIAGNOSIS — E875 Hyperkalemia: Secondary | ICD-10-CM | POA: Diagnosis not present

## 2016-07-25 DIAGNOSIS — F17200 Nicotine dependence, unspecified, uncomplicated: Secondary | ICD-10-CM

## 2016-07-25 NOTE — Assessment & Plan Note (Signed)
Repeat lab today, has taken one dose of kayexalate

## 2016-07-25 NOTE — Progress Notes (Signed)
Donald Everett     MRN: [id number]      DOB: 04/28/1940   HPI Donald Everett is here for follow up and re-evaluation of chronic medical conditions, medication management and review of any available recent lab and radiology data.  Preventive health is updated, specifically  Cancer screening and Immunization.   Questions or concerns regarding consultations or procedures which the PT has had in the interim are  addressed. The PT denies any adverse reactions to current medications since the last visit.  There are no new concerns.  There are no specific complaints   ROS Denies recent fever or chills. Denies sinus pressure, nasal congestion, ear pain or sore throat. Denies chest congestion, productive cough or wheezing. Denies chest pains, palpitations and leg swelling Denies abdominal pain, nausea, vomiting,diarrhea or constipation.   Denies dysuria, frequency, hesitancy or incontinence. Denies joint pain, swelling and limitation in mobility. Denies headaches, seizures, numbness, or tingling. Denies depression, anxiety or insomnia. Denies skin break down or rash.   PE  BP (!) 158/70   Pulse 73   Resp 16   Ht 5\' 9"  (1.753 m)   Wt 144 lb (65.3 kg)   SpO2 97%   BMI 21.27 kg/m   Patient alert and oriented and in no cardiopulmonary distress.  HEENT: No facial asymmetry, EOMI,   oropharynx pink and moist.  Neck supple no JVD, no mass.  Chest: Clear to auscultation bilaterally.Decreased air entry bilaterally  CVS: S1, S2 no murmurs, no S3.Regular rate.  ABD: Soft non tender.   Ext: No edema  MS: Adequate though reduced  ROM spine, shoulders, hips and knees.  Skin: Intact, no ulcerations or rash noted.  Psych: Good eye contact, normal affect. Memory intact not anxious or depressed appearing.  CNS: CN 2-12 intact, power,  normal throughout.no focal deficits noted.   Assessment & Plan  Diabetes mellitus, insulin dependent (IDDM), controlled (HCC) Controlled, no change in  medication Donald Everett is reminded of the importance of commitment to daily physical activity for 30 minutes or more, as able and the need to limit carbohydrate intake to 30 to 60 grams per meal to help with blood sugar control.   The need to take medication as prescribed, test blood sugar as directed, and to call between visits if there is a concern that blood sugar is uncontrolled is also discussed.   Donald Everett is reminded of the importance of daily foot exam, annual eye examination, and good blood sugar, blood pressure and cholesterol control.  Diabetic Labs Latest Ref Rng & Units 07/20/2016 05/18/2016 05/16/2016 02/26/2016 11/20/2015  HbA1c <5.7 % 6.9(H) - 6.6(H) 6.9(H) 6.9(H)  Microalbumin Not estab mg/dL - - - - -  Micro/Creat Ratio <30 mcg/mg creat - - - - -  Chol <200 mg/dL - - 97 - 409  HDL >81 mg/dL - - 19(J) - 47(W)  Calc LDL <100 mg/dL - - 48 - 75  Triglycerides <150 mg/dL - - 295 - 621  Creatinine 0.70 - 1.18 mg/dL 3.08(M) 5.78(I) 6.96(E) 1.57(H) 1.34(H)   BP/Weight 07/25/2016 05/19/2016 03/11/2016 01/26/2016 11/25/2015 11/12/2015 10/14/2015  Systolic BP 158 148 116 132 148 - -  Diastolic BP 70 68 48 58 64 - -  Wt. (Lbs) 144 142 146 138.04 138.12 137 135  BMI 21.27 20.97 21.56 20.38 20.4 20.23 19.94   Foot/eye exam completion dates 03/11/2016 12/16/2014  Foot Form Completion Done Done        Renovascular hypertension Uncontrolled , referred to cardiology due  to cAD seen on recent chest scan he has renovascular disease , no med chane , nestro quit smoking DASH diet and commitment to daily physical activity for a minimum of 30 minutes discussed and encouraged, as a part of hypertension management. The importance of attaining a healthy weight is also discussed.  BP/Weight 07/25/2016 05/19/2016 03/11/2016 01/26/2016 11/25/2015 11/12/2015 10/14/2015  Systolic BP 158 148 116 132 148 - -  Diastolic BP 70 68 48 58 64 - -  Wt. (Lbs) 144 142 146 138.04 138.12 137 135  BMI 21.27 20.97 21.56 20.38  20.4 20.23 19.94       NICOTINE ADDICTION Unchanged, 1PPD Patient is asked and  confirms current  Nicotine use.  Five to seven minutes of time is spent in counseling the patient of the need to quit smoking  Advice to quit is delivered clearly specifically in reducing the risk of developing heart disease, having a stroke, or of developing all types of cancer, especially lung and oral cancer. Improvement in breathing and exercise tolerance and quality of life is also discussed, as is the economic benefit.  Assessment of willingness to quit or to make an attempt to quit is made and documented  Assistance in quit attempt is made with several and varied options presented, based on patient's desire and need. These include  literature, local classes available, 1800 QUIT NOW number, OTC and prescription medication.  The GOAL to be NICOTINE FREE is re emphasized.  The patient has set a personal goal of either reduction or discontinuation and follow up is arranged between 6 an 16 weeks.    Hyperlipidemia LDL goal <100 Hyperlipidemia:Low fat diet discussed and encouraged.   Lipid Panel  Lab Results  Component Value Date   CHOL 97 05/16/2016   HDL 27 (L) 05/16/2016   LDLCALC 48 05/16/2016   TRIG 112 05/16/2016   CHOLHDL 3.6 05/16/2016    Controlled, no change in medication No med change   Hyperkalemia Repeat lab today, has taken one dose of kayexalate

## 2016-07-25 NOTE — Telephone Encounter (Signed)
Patient was advised to take it that day

## 2016-07-25 NOTE — Assessment & Plan Note (Signed)
Unchanged, 1PPD Patient is asked and  confirms current  Nicotine use.  Five to seven minutes of time is spent in counseling the patient of the need to quit smoking  Advice to quit is delivered clearly specifically in reducing the risk of developing heart disease, having a stroke, or of developing all types of cancer, especially lung and oral cancer. Improvement in breathing and exercise tolerance and quality of life is also discussed, as is the economic benefit.  Assessment of willingness to quit or to make an attempt to quit is made and documented  Assistance in quit attempt is made with several and varied options presented, based on patient's desire and need. These include  literature, local classes available, 1800 QUIT NOW number, OTC and prescription medication.  The GOAL to be NICOTINE FREE is re emphasized.  The patient has set a personal goal of either reduction or discontinuation and follow up is arranged between 6 an 16 weeks.

## 2016-07-25 NOTE — Assessment & Plan Note (Signed)
Hyperlipidemia:Low fat diet discussed and encouraged.   Lipid Panel  Lab Results  Component Value Date   CHOL 97 05/16/2016   HDL 27 (L) 05/16/2016   LDLCALC 48 05/16/2016   TRIG 112 05/16/2016   CHOLHDL 3.6 05/16/2016    Controlled, no change in medication No med change

## 2016-07-25 NOTE — Assessment & Plan Note (Signed)
Controlled, no change in medication Donald Everett is reminded of the importance of commitment to daily physical activity for 30 minutes or more, as able and the need to limit carbohydrate intake to 30 to 60 grams per meal to help with blood sugar control.   The need to take medication as prescribed, test blood sugar as directed, and to call between visits if there is a concern that blood sugar is uncontrolled is also discussed.   Donald Everett is reminded of the importance of daily foot exam, annual eye examination, and good blood sugar, blood pressure and cholesterol control.  Diabetic Labs Latest Ref Rng & Units 07/20/2016 05/18/2016 05/16/2016 02/26/2016 11/20/2015  HbA1c <5.7 % 6.9(H) - 6.6(H) 6.9(H) 6.9(H)  Microalbumin Not estab mg/dL - - - - -  Micro/Creat Ratio <30 mcg/mg creat - - - - -  Chol <200 mg/dL - - 97 - 413125  HDL >24>40 mg/dL - - 40(N27(L) - 02(V26(L)  Calc LDL <100 mg/dL - - 48 - 75  Triglycerides <150 mg/dL - - 253112 - 664119  Creatinine 0.70 - 1.18 mg/dL 4.03(K1.60(H) 7.42(V1.62(H) 9.56(L2.01(H) 1.57(H) 1.34(H)   BP/Weight 07/25/2016 05/19/2016 03/11/2016 01/26/2016 11/25/2015 11/12/2015 10/14/2015  Systolic BP 158 148 116 132 148 - -  Diastolic BP 70 68 48 58 64 - -  Wt. (Lbs) 144 142 146 138.04 138.12 137 135  BMI 21.27 20.97 21.56 20.38 20.4 20.23 19.94   Foot/eye exam completion dates 03/11/2016 12/16/2014  Foot Form Completion Done Done

## 2016-07-25 NOTE — Assessment & Plan Note (Signed)
Uncontrolled , referred to cardiology due to cAD seen on recent chest scan he has renovascular disease , no med chane , nestro quit smoking DASH diet and commitment to daily physical activity for a minimum of 30 minutes discussed and encouraged, as a part of hypertension management. The importance of attaining a healthy weight is also discussed.  BP/Weight 07/25/2016 05/19/2016 03/11/2016 01/26/2016 11/25/2015 11/12/2015 10/14/2015  Systolic BP 158 148 116 132 148 - -  Diastolic BP 70 68 48 58 64 - -  Wt. (Lbs) 144 142 146 138.04 138.12 137 135  BMI 21.27 20.97 21.56 20.38 20.4 20.23 19.94

## 2016-07-25 NOTE — Patient Instructions (Addendum)
F/u week of July 16 , call if you need me sooner  Please work on quitting smoking to improve breathing and reduce heart disease risk and  Save money  Chem 7 and eGFR today   Steps to Quit Smoking Smoking tobacco can be bad for your health. It can also affect almost every organ in your body. Smoking puts you and people around you at risk for many serious long-lasting (chronic) diseases. Quitting smoking is hard, but it is one of the best things that you can do for your health. It is never too late to quit. What are the benefits of quitting smoking? When you quit smoking, you lower your risk for getting serious diseases and conditions. They can include:  Lung cancer or lung disease.  Heart disease.  Stroke.  Heart attack.  Not being able to have children (infertility).  Weak bones (osteoporosis) and broken bones (fractures).  If you have coughing, wheezing, and shortness of breath, those symptoms may get better when you quit. You may also get sick less often. If you are pregnant, quitting smoking can help to lower your chances of having a baby of low birth weight. What can I do to help me quit smoking? Talk with your doctor about what can help you quit smoking. Some things you can do (strategies) include:  Quitting smoking totally, instead of slowly cutting back how much you smoke over a period of time.  Going to in-person counseling. You are more likely to quit if you go to many counseling sessions.  Using resources and support systems, such as: ? Database administrator with a Social worker. ? Phone quitlines. ? Careers information officer. ? Support groups or group counseling. ? Text messaging programs. ? Mobile phone apps or applications.  Taking medicines. Some of these medicines may have nicotine in them. If you are pregnant or breastfeeding, do not take any medicines to quit smoking unless your doctor says it is okay. Talk with your doctor about counseling or other things that can help  you.  Talk with your doctor about using more than one strategy at the same time, such as taking medicines while you are also going to in-person counseling. This can help make quitting easier. What things can I do to make it easier to quit? Quitting smoking might feel very hard at first, but there is a lot that you can do to make it easier. Take these steps:  Talk to your family and friends. Ask them to support and encourage you.  Call phone quitlines, reach out to support groups, or work with a Social worker.  Ask people who smoke to not smoke around you.  Avoid places that make you want (trigger) to smoke, such as: ? Bars. ? Parties. ? Smoke-break areas at work.  Spend time with people who do not smoke.  Lower the stress in your life. Stress can make you want to smoke. Try these things to help your stress: ? Getting regular exercise. ? Deep-breathing exercises. ? Yoga. ? Meditating. ? Doing a body scan. To do this, close your eyes, focus on one area of your body at a time from head to toe, and notice which parts of your body are tense. Try to relax the muscles in those areas.  Download or buy apps on your mobile phone or tablet that can help you stick to your quit plan. There are many free apps, such as QuitGuide from the [employer] Office manager for Disease Control and Prevention). You can find more support from  smokefree.gov and other websites.  This information is not intended to replace advice given to you by your health care provider. Make sure you discuss any questions you have with your health care provider. Document Released: 11/27/2008 Document Revised: 09/29/2015 Document Reviewed: 06/17/2014 Elsevier Interactive Patient Education  2018 [employer].

## 2016-07-26 LAB — BASIC METABOLIC PANEL WITH GFR
BUN: 31 mg/dL — ABNORMAL HIGH (ref 7–25)
CO2: 22 mmol/L (ref 20–31)
Calcium: 8.8 mg/dL (ref 8.6–10.3)
Chloride: 103 mmol/L (ref 98–110)
Creat: 1.51 mg/dL — ABNORMAL HIGH (ref 0.70–1.18)
GFR, EST NON AFRICAN AMERICAN: 45 mL/min — AB (ref >=60)
GFR, Est African American: 51 mL/min — ABNORMAL LOW (ref >=60)
GLUCOSE: 189 mg/dL — AB (ref 65–99)
POTASSIUM: 5.8 mmol/L — AB (ref 3.5–5.3)
SODIUM: 134 mmol/L — AB (ref 135–146)

## 2016-08-08 ENCOUNTER — Ambulatory Visit: Payer: PPO

## 2016-08-15 ENCOUNTER — Ambulatory Visit (INDEPENDENT_AMBULATORY_CARE_PROVIDER_SITE_OTHER): Payer: PPO

## 2016-08-15 DIAGNOSIS — D519 Vitamin B12 deficiency anemia, unspecified: Secondary | ICD-10-CM

## 2016-08-29 ENCOUNTER — Other Ambulatory Visit: Payer: Self-pay | Admitting: Family Medicine

## 2016-08-30 ENCOUNTER — Ambulatory Visit (INDEPENDENT_AMBULATORY_CARE_PROVIDER_SITE_OTHER): Payer: PPO | Admitting: Family Medicine

## 2016-08-30 ENCOUNTER — Encounter: Payer: Self-pay | Admitting: Family Medicine

## 2016-08-30 VITALS — BP 128/80 | HR 73 | Resp 16 | Ht 69.0 in | Wt 141.0 lb

## 2016-08-30 DIAGNOSIS — F17200 Nicotine dependence, unspecified, uncomplicated: Secondary | ICD-10-CM | POA: Diagnosis not present

## 2016-08-30 DIAGNOSIS — E1140 Type 2 diabetes mellitus with diabetic neuropathy, unspecified: Secondary | ICD-10-CM

## 2016-08-30 DIAGNOSIS — Z794 Long term (current) use of insulin: Secondary | ICD-10-CM

## 2016-08-30 DIAGNOSIS — E785 Hyperlipidemia, unspecified: Secondary | ICD-10-CM

## 2016-08-30 DIAGNOSIS — IMO0001 Reserved for inherently not codable concepts without codable children: Secondary | ICD-10-CM

## 2016-08-30 DIAGNOSIS — N521 Erectile dysfunction due to diseases classified elsewhere: Secondary | ICD-10-CM

## 2016-08-30 DIAGNOSIS — I150 Renovascular hypertension: Secondary | ICD-10-CM | POA: Diagnosis not present

## 2016-08-30 DIAGNOSIS — E119 Type 2 diabetes mellitus without complications: Secondary | ICD-10-CM | POA: Diagnosis not present

## 2016-08-30 DIAGNOSIS — G8929 Other chronic pain: Secondary | ICD-10-CM | POA: Diagnosis not present

## 2016-08-30 MED ORDER — AMLODIPINE BESYLATE 10 MG PO TABS: 10.0000 mg | ORAL_TABLET | Freq: Every day | ORAL | 1 refills | Status: DC

## 2016-08-30 MED ORDER — HYDROCODONE-ACETAMINOPHEN 7.5-325 MG PO TABS: 1.0000 | ORAL_TABLET | Freq: Two times a day (BID) | ORAL | 0 refills | Status: DC | PRN

## 2016-08-30 MED ORDER — SITAGLIPTIN PHOSPHATE 100 MG PO TABS: 100.0000 mg | ORAL_TABLET | Freq: Every day | ORAL | 1 refills | Status: DC

## 2016-08-30 MED ORDER — GABAPENTIN 300 MG PO CAPS: ORAL_CAPSULE | ORAL | 1 refills | Status: DC

## 2016-08-30 MED ORDER — PRAVASTATIN SODIUM 20 MG PO TABS: 20.0000 mg | ORAL_TABLET | Freq: Every morning | ORAL | 1 refills | Status: DC

## 2016-08-30 NOTE — Patient Instructions (Addendum)
Wellness with nurse in next 4 to 10 weeks pt will call and schedule  MD follow up witth l;abs and chronic disease management including pain management in 11 weeks  Fasting labs 1 week before MD visit pls Vit D, lipid, cmp and EGFR and hBa1C  Continue to try to cut back on smoking!!   Steps to Quit Smoking Smoking tobacco can be bad for your health. It can also affect almost every organ in your body. Smoking puts you and people around you at risk for many serious long-lasting (chronic) diseases. Quitting smoking is hard, but it is one of the best things that you can do for your health. It is never too late to quit. What are the benefits of quitting smoking? When you quit smoking, you lower your risk for getting serious diseases and conditions. They can include:  Lung cancer or lung disease.  Heart disease.  Stroke.  Heart attack.  Not being able to have children (infertility).  Weak bones (osteoporosis) and broken bones (fractures).  If you have coughing, wheezing, and shortness of breath, those symptoms may get better when you quit. You may also get sick less often. If you are pregnant, quitting smoking can help to lower your chances of having a baby of low birth weight. What can I do to help me quit smoking? Talk with your doctor about what can help you quit smoking. Some things you can do (strategies) include:  Quitting smoking totally, instead of slowly cutting back how much you smoke over a period of time.  Going to in-person counseling. You are more likely to quit if you go to many counseling sessions.  Using resources and support systems, such as: ? Database administrator with a Social worker. ? Phone quitlines. ? Careers information officer. ? Support groups or group counseling. ? Text messaging programs. ? Mobile phone apps or applications.  Taking medicines. Some of these medicines may have nicotine in them. If you are pregnant or breastfeeding, do not take any medicines to quit  smoking unless your doctor says it is okay. Talk with your doctor about counseling or other things that can help you.  Talk with your doctor about using more than one strategy at the same time, such as taking medicines while you are also going to in-person counseling. This can help make quitting easier. What things can I do to make it easier to quit? Quitting smoking might feel very hard at first, but there is a lot that you can do to make it easier. Take these steps:  Talk to your family and friends. Ask them to support and encourage you.  Call phone quitlines, reach out to support groups, or work with a Social worker.  Ask people who smoke to not smoke around you.  Avoid places that make you want (trigger) to smoke, such as: ? Bars. ? Parties. ? Smoke-break areas at work.  Spend time with people who do not smoke.  Lower the stress in your life. Stress can make you want to smoke. Try these things to help your stress: ? Getting regular exercise. ? Deep-breathing exercises. ? Yoga. ? Meditating. ? Doing a body scan. To do this, close your eyes, focus on one area of your body at a time from head to toe, and notice which parts of your body are tense. Try to relax the muscles in those areas.  Download or buy apps on your mobile phone or tablet that can help you stick to your quit plan. There are many  free apps, such as QuitGuide from the [employer] Office manager for Disease Control and Prevention). You can find more support from smokefree.gov and other websites.  This information is not intended to replace advice given to you by your health care provider. Make sure you discuss any questions you have with your health care provider. Document Released: 11/27/2008 Document Revised: 09/29/2015 Document Reviewed: 06/17/2014 Elsevier Interactive Patient Education  2018 [employer]. No change in pain management

## 2016-09-05 ENCOUNTER — Encounter: Payer: Self-pay | Admitting: Family Medicine

## 2016-09-05 NOTE — Assessment & Plan Note (Signed)
Pain management reviewed and patient reports appropriate level of control. [place] registry reviewed , and he is compliant with prescribed medication Pain contract reviewed  12 weeks of medication prescribed with appropriate follow up, no change in management

## 2016-09-05 NOTE — Assessment & Plan Note (Signed)

## 2016-09-05 NOTE — Assessment & Plan Note (Signed)
Controlled, no change in medication Donald Everett is reminded of the importance of commitment to daily physical activity for 30 minutes or more, as able and the need to limit carbohydrate intake to 30 to 60 grams per meal to help with blood sugar control.   The need to take medication as prescribed, test blood sugar as directed, and to call between visits if there is a concern that blood sugar is uncontrolled is also discussed.   Donald Everett is reminded of the importance of daily foot exam, annual eye examination, and good blood sugar, blood pressure and cholesterol control.  Diabetic Labs Latest Ref Rng & Units 07/25/2016 07/20/2016 05/18/2016 05/16/2016 02/26/2016  HbA1c <5.7 % - 6.9(H) - 6.6(H) 6.9(H)  Microalbumin Not estab mg/dL - - - - -  Micro/Creat Ratio <30 mcg/mg creat - - - - -  Chol <200 mg/dL - - - 97 -  HDL >91>40 mg/dL - - - 47(W27(L) -  Calc LDL <100 mg/dL - - - 48 -  Triglycerides <150 mg/dL - - - 295112 -  Creatinine 0.70 - 1.18 mg/dL 6.21(H1.51(H) 0.86(V1.60(H) 7.84(O1.62(H) 2.01(H) 1.57(H)   BP/Weight 08/30/2016 07/25/2016 05/19/2016 03/11/2016 01/26/2016 11/25/2015 11/12/2015  Systolic BP 128 158 148 116 132 148 -  Diastolic BP 80 70 68 48 58 64 -  Wt. (Lbs) 141 144 142 146 138.04 138.12 137  BMI 20.82 21.27 20.97 21.56 20.38 20.4 20.23   Foot/eye exam completion dates 03/11/2016 12/16/2014  Foot Form Completion Done Done   Updated lab needed at/ before next visit.

## 2016-09-05 NOTE — Assessment & Plan Note (Signed)
Controlled, no change in medication DASH diet and commitment to daily physical activity for a minimum of 30 minutes discussed and encouraged, as a part of hypertension management. The importance of attaining a healthy weight is also discussed.  BP/Weight 08/30/2016 07/25/2016 05/19/2016 03/11/2016 01/26/2016 11/25/2015 11/12/2015  Systolic BP 128 158 148 116 132 148 -  Diastolic BP 80 70 68 48 58 64 -  Wt. (Lbs) 141 144 142 146 138.04 138.12 137  BMI 20.82 21.27 20.97 21.56 20.38 20.4 20.23

## 2016-09-05 NOTE — Progress Notes (Signed)
Donald Everett     MRN: [id number]      DOB: 03/28/1940   HPI [name] is here for follow up and re-evaluation of chronic medical conditions, medication management, in particular chronic pain management, and review of any available recent lab and radiology data.  Preventive health is updated, specifically  Cancer screening and Immunization.   Questions or concerns regarding consultations or procedures which the PT has had in the interim are  addressed. The PT denies any adverse reactions to current medications since the last visit.  There are no new concerns.  There are no specific complaints   ROS Denies recent fever or chills. Denies sinus pressure, nasal congestion, ear pain or sore throat. Denies chest congestion, productive cough or wheezing. Denies chest pains, palpitations and leg swelling Denies abdominal pain, nausea, vomiting,diarrhea or constipation.   Denies dysuria, frequency, hesitancy or incontinence. Denies uncontrolled  joint pain, swelling and limitation in mobility. Denies headaches, seizures, numbness, or tingling. Denies depression, uncontrolled anxiety or insomnia. Denies skin break down or rash.   PE  BP 128/80   Pulse 73   Resp 16   Ht 5\' 9"  (1.753 m)   Wt 141 lb (64 kg)   SpO2 99%   BMI 20.82 kg/m   Patient alert and oriented and in no cardiopulmonary distress.  HEENT: No facial asymmetry, EOMI,   oropharynx pink and moist.  Neck supple no JVD, no mass.  Chest: Clear to auscultation bilaterally.decreased air entry bilaterally  CVS: S1, S2 no murmurs, no S3.Regular rate.  ABD: Soft non tender.   Ext: No edema  MS: Decreased  ROM spine, adequate in shoulders, hips and knees.  Skin: Intact, no ulcerations or rash noted.  Psych: Good eye contact, normal affect. Memory intact not anxious or depressed appearing.  CNS: CN 2-12 intact, power,  normal throughout.no focal deficits noted.   Assessment & Plan  Encounter for chronic pain  management Pain management reviewed and patient reports appropriate level of control. [place] registry reviewed , and he is compliant with prescribed medication Pain contract reviewed  12 weeks of medication prescribed with appropriate follow up, no change in management  Diabetes mellitus, insulin dependent (IDDM), controlled (HCC) Controlled, no change in medication [name] is reminded of the importance of commitment to daily physical activity for 30 minutes or more, as able and the need to limit carbohydrate intake to 30 to 60 grams per meal to help with blood sugar control.   The need to take medication as prescribed, test blood sugar as directed, and to call between visits if there is a concern that blood sugar is uncontrolled is also discussed.   [name] is reminded of the importance of daily foot exam, annual eye examination, and good blood sugar, blood pressure and cholesterol control.  Diabetic Labs Latest Ref Rng & Units 07/25/2016 07/20/2016 05/18/2016 05/16/2016 02/26/2016  HbA1c <5.7 % - 6.9(H) - 6.6(H) 6.9(H)  Microalbumin Not estab mg/dL - - - - -  Micro/Creat Ratio <30 mcg/mg creat - - - - -  Chol <200 mg/dL - - - 97 -  HDL >40>40 mg/dL - - - 98(J27(L) -  Calc LDL <100 mg/dL - - - 48 -  Triglycerides <150 mg/dL - - - 191112 -  Creatinine 0.70 - 1.18 mg/dL 4.78(G1.51(H) 9.56(O1.60(H) 1.30(Q1.62(H) 2.01(H) 1.57(H)   BP/Weight 08/30/2016 07/25/2016 05/19/2016 03/11/2016 01/26/2016 11/25/2015 11/12/2015  Systolic BP 128 158 148 116 132 148 -  Diastolic BP 80 70 68 48  58 64 -  Wt. (Lbs) 141 144 142 146 138.04 138.12 137  BMI 20.82 21.27 20.97 21.56 20.38 20.4 20.23   Foot/eye exam completion dates 03/11/2016 12/16/2014  Foot Form Completion Done Done   Updated lab needed at/ before next visit.      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 The  importance of attaining a healthy weight is also discussed.  BP/Weight 08/30/2016 07/25/2016 05/19/2016 03/11/2016 01/26/2016 11/25/2015 11/12/2015  Systolic BP 128 158 148 116 132 148 -  Diastolic BP 80 70 68 48 58 64 -  Wt. (Lbs) 141 144 142 146 138.04 138.12 137  BMI 20.82 21.27 20.97 21.56 20.38 20.4 20.23       NICOTINE ADDICTION Patient is asked and  confirms current  Nicotine use.  Five to seven minutes of time is spent in counseling the patient of the need to quit smoking  Advice to quit is delivered clearly specifically in reducing the risk of developing heart disease, having a stroke, or of developing all types of cancer, especially lung and oral cancer. Improvement in breathing and exercise tolerance and quality of life is also discussed, as is the economic benefit.  Assessment of willingness to quit or to make an attempt to quit is made and documented  Assistance in quit attempt is made with several and varied options presented, based on patient's desire and need. These include  literature, local classes available, 1800 QUIT NOW number, OTC and prescription medication.  The GOAL to be NICOTINE FREE is re emphasized.  The patient has set a personal goal of either reduction or discontinuation and follow up is arranged between 6 an 16 weeks.    Hyperlipidemia LDL goal <100 Hyperlipidemia:Low fat diet discussed and encouraged.   Lipid Panel  Lab Results  Component Value Date   CHOL 97 05/16/2016   HDL 27 (L) 05/16/2016   LDLCALC 48 05/16/2016   TRIG 112 05/16/2016   CHOLHDL 3.6 05/16/2016  Controlled, no change in medication

## 2016-09-05 NOTE — Assessment & Plan Note (Signed)
Hyperlipidemia:Low fat diet discussed and encouraged.   Lipid Panel  Lab Results  Component Value Date   CHOL 97 05/16/2016   HDL 27 (L) 05/16/2016   LDLCALC 48 05/16/2016   TRIG 112 05/16/2016   CHOLHDL 3.6 05/16/2016  Controlled, no change in medication

## 2016-09-14 ENCOUNTER — Ambulatory Visit (INDEPENDENT_AMBULATORY_CARE_PROVIDER_SITE_OTHER): Payer: PPO

## 2016-09-14 DIAGNOSIS — D519 Vitamin B12 deficiency anemia, unspecified: Secondary | ICD-10-CM

## 2016-09-14 MED ORDER — CYANOCOBALAMIN 1000 MCG/ML IJ SOLN
1000.0000 ug | Freq: Once | INTRAMUSCULAR | Status: AC
  Administered 2016-09-14: 1000 ug via INTRAMUSCULAR

## 2016-09-14 NOTE — Progress Notes (Signed)
Patient received injection with no complication

## 2016-10-04 ENCOUNTER — Other Ambulatory Visit: Payer: Self-pay | Admitting: Family Medicine

## 2016-10-04 ENCOUNTER — Other Ambulatory Visit: Payer: Self-pay

## 2016-10-19 ENCOUNTER — Ambulatory Visit (INDEPENDENT_AMBULATORY_CARE_PROVIDER_SITE_OTHER): Payer: PPO

## 2016-10-19 VITALS — BP 132/78 | HR 72 | Temp 97.6°F | Ht 69.0 in | Wt 146.0 lb

## 2016-10-19 DIAGNOSIS — Z0000 Encounter for general adult medical examination without abnormal findings: Secondary | ICD-10-CM | POA: Diagnosis not present

## 2016-10-19 DIAGNOSIS — D519 Vitamin B12 deficiency anemia, unspecified: Secondary | ICD-10-CM | POA: Diagnosis not present

## 2016-10-19 MED ORDER — CYANOCOBALAMIN 1000 MCG/ML IJ SOLN
1000.0000 ug | Freq: Once | INTRAMUSCULAR | Status: AC
  Administered 2016-10-19: 1000 ug via INTRAMUSCULAR

## 2016-10-19 NOTE — Patient Instructions (Signed)
Donald Everett , Thank you for taking time to come for your Medicare Wellness Visit. I appreciate your ongoing commitment to your health goals. Please review the following plan we discussed and let me know if I can assist you in the future.   Screening recommendations/referrals: Colonoscopy: Up date, next due 09/2025 Diabetic Eye Exam: Up to date, next due 02/2017 Recommended yearly dental visit for hygiene and checkup  Vaccinations: Influenza vaccine: Due, please ask about this in October Pneumococcal vaccine: Up to date Tdap vaccine: Up to date, next due 12/2024 Shingles vaccine: Done  Advanced directives: Advance directive discussed with you today. I have provided a copy for you to complete at home and have notarized. Once this is complete please bring a copy in to our office so we can scan it into your chart.  Conditions/risks identified: Quit smoking, you are at risk for developing lung cancer.  Next appointment: Follow up with Dr. Lodema Hong on 11/17/2016 at 1:00 pm. Follow up in 1 year for your annual wellness visit.   Preventive Care 18 Years and Older, Male Preventive care refers to lifestyle choices and visits with your health care provider that can promote health and wellness. What does preventive care include?  A yearly physical exam. This is also called an annual well check.  Dental exams once or twice a year.  Routine eye exams. Ask your health care provider how often you should have your eyes checked.  Personal lifestyle choices, including:  Daily care of your teeth and gums.  Regular physical activity.  Eating a healthy diet.  Avoiding tobacco and drug use.  Limiting alcohol use.  Practicing safe sex.  Taking low doses of aspirin every day.  Taking vitamin and mineral supplements as recommended by your health care provider. What happens during an annual well check? The services and screenings done by your health care provider during your annual well check will  depend on your age, overall health, lifestyle risk factors, and family history of disease. Counseling  Your health care provider may ask you questions about your:  Alcohol use.  Tobacco use.  Drug use.  Emotional well-being.  Home and relationship well-being.  Sexual activity.  Eating habits.  History of falls.  Memory and ability to understand (cognition).  Work and work Astronomer. Screening  You may have the following tests or measurements:  Height, weight, and BMI.  Blood pressure.  Lipid and cholesterol levels. These may be checked every 5 years, or more frequently if you are over 83 years old.  Skin check.  Lung cancer screening. You may have this screening every year starting at age 92 if you have a 30-pack-year history of smoking and currently smoke or have quit within the past 15 years.  Fecal occult blood test (FOBT) of the stool. You may have this test every year starting at age 52.  Flexible sigmoidoscopy or colonoscopy. You may have a sigmoidoscopy every 5 years or a colonoscopy every 10 years starting at age 70.  Prostate cancer screening. Recommendations will vary depending on your family history and other risks.  Hepatitis C blood test.  Hepatitis B blood test.  Sexually transmitted disease (STD) testing.  Diabetes screening. This is done by checking your blood sugar (glucose) after you have not eaten for a while (fasting). You may have this done every 1-3 years.  Abdominal aortic aneurysm (AAA) screening. You may need this if you are a current or former smoker.  Osteoporosis. You may be screened starting at age  70 if you are at high risk. Talk with your health care provider about your test results, treatment options, and if necessary, the need for more tests. Vaccines  Your health care provider may recommend certain vaccines, such as:  Influenza vaccine. This is recommended every year.  Tetanus, diphtheria, and acellular pertussis (Tdap,  Td) vaccine. You may need a Td booster every 10 years.  Zoster vaccine. You may need this after age 46.  Pneumococcal 13-valent conjugate (PCV13) vaccine. One dose is recommended after age 14.  Pneumococcal polysaccharide (PPSV23) vaccine. One dose is recommended after age 13. Talk to your health care provider about which screenings and vaccines you need and how often you need them. This information is not intended to replace advice given to you by your health care provider. Make sure you discuss any questions you have with your health care provider. Document Released: 02/27/2015 Document Revised: 10/21/2015 Document Reviewed: 12/02/2014 Elsevier Interactive Patient Education  2017 [employer].  Fall Prevention in the Home Falls can cause injuries. They can happen to people of all ages. There are many things you can do to make your home safe and to help prevent falls. What can I do on the outside of my home?  Regularly fix the edges of walkways and driveways and fix any cracks.  Remove anything that might make you trip as you walk through a door, such as a raised step or threshold.  Trim any bushes or trees on the path to your home.  Use bright outdoor lighting.  Clear any walking paths of anything that might make someone trip, such as rocks or tools.  Regularly check to see if handrails are loose or broken. Make sure that both sides of any steps have handrails.  Any raised decks and porches should have guardrails on the edges.  Have any leaves, snow, or ice cleared regularly.  Use sand or salt on walking paths during winter.  Clean up any spills in your garage right away. This includes oil or grease spills. What can I do in the bathroom?  Use night lights.  Install grab bars by the toilet and in the tub and shower. Do not use towel bars as grab bars.  Use non-skid mats or decals in the tub or shower.  If you need to sit down in the shower, use a plastic, non-slip  stool.  Keep the floor dry. Clean up any water that spills on the floor as soon as it happens.  Remove soap buildup in the tub or shower regularly.  Attach bath mats securely with double-sided non-slip rug tape.  Do not have throw rugs and other things on the floor that can make you trip. What can I do in the bedroom?  Use night lights.  Make sure that you have a light by your bed that is easy to reach.  Do not use any sheets or blankets that are too big for your bed. They should not hang down onto the floor.  Have a firm chair that has side arms. You can use this for support while you get dressed.  Do not have throw rugs and other things on the floor that can make you trip. What can I do in the kitchen?  Clean up any spills right away.  Avoid walking on wet floors.  Keep items that you use a lot in easy-to-reach places.  If you need to reach something above you, use a strong step stool that has a grab bar.  Keep  electrical cords out of the way.  Do not use floor polish or wax that makes floors slippery. If you must use wax, use non-skid floor wax.  Do not have throw rugs and other things on the floor that can make you trip. What can I do with my stairs?  Do not leave any items on the stairs.  Make sure that there are handrails on both sides of the stairs and use them. Fix handrails that are broken or loose. Make sure that handrails are as long as the stairways.  Check any carpeting to make sure that it is firmly attached to the stairs. Fix any carpet that is loose or worn.  Avoid having throw rugs at the top or bottom of the stairs. If you do have throw rugs, attach them to the floor with carpet tape.  Make sure that you have a light switch at the top of the stairs and the bottom of the stairs. If you do not have them, ask someone to add them for you. What else can I do to help prevent falls?  Wear shoes that:  Do not have high heels.  Have rubber bottoms.  Are  comfortable and fit you well.  Are closed at the toe. Do not wear sandals.  If you use a stepladder:  Make sure that it is fully opened. Do not climb a closed stepladder.  Make sure that both sides of the stepladder are locked into place.  Ask someone to hold it for you, if possible.  Clearly mark and make sure that you can see:  Any grab bars or handrails.  First and last steps.  Where the edge of each step is.  Use tools that help you move around (mobility aids) if they are needed. These include:  Canes.  Walkers.  Scooters.  Crutches.  Turn on the lights when you go into a dark area. Replace any light bulbs as soon as they burn out.  Set up your furniture so you have a clear path. Avoid moving your furniture around.  If any of your floors are uneven, fix them.  If there are any pets around you, be aware of where they are.  Review your medicines with your doctor. Some medicines can make you feel dizzy. This can increase your chance of falling. Ask your doctor what other things that you can do to help prevent falls. This information is not intended to replace advice given to you by your health care provider. Make sure you discuss any questions you have with your health care provider. Document Released: 11/27/2008 Document Revised: 07/09/2015 Document Reviewed: 03/07/2014 Elsevier Interactive Patient Education  2017 [employer].  Steps to Quit Smoking Smoking tobacco can be bad for your health. It can also affect almost every organ in your body. Smoking puts you and people around you at risk for many serious long-lasting (chronic) diseases. Quitting smoking is hard, but it is one of the best things that you can do for your health. It is never too late to quit. What are the benefits of quitting smoking? When you quit smoking, you lower your risk for getting serious diseases and conditions. They can include:  Lung cancer or lung disease.  Heart  disease.  Stroke.  Heart attack.  Not being able to have children (infertility).  Weak bones (osteoporosis) and broken bones (fractures).  If you have coughing, wheezing, and shortness of breath, those symptoms may get better when you quit. You may also get sick less  often. If you are pregnant, quitting smoking can help to lower your chances of having a baby of low birth weight. What can I do to help me quit smoking? Talk with your doctor about what can help you quit smoking. Some things you can do (strategies) include:  Quitting smoking totally, instead of slowly cutting back how much you smoke over a period of time.  Going to in-person counseling. You are more likely to quit if you go to many counseling sessions.  Using resources and support systems, such as: ? Agricultural engineernline chats with a Veterinary surgeoncounselor. ? Phone quitlines. ? Automotive engineerrinted self-help materials. ? Support groups or group counseling. ? Text messaging programs. ? Mobile phone apps or applications.  Taking medicines. Some of these medicines may have nicotine in them. If you are pregnant or breastfeeding, do not take any medicines to quit smoking unless your doctor says it is okay. Talk with your doctor about counseling or other things that can help you.  Talk with your doctor about using more than one strategy at the same time, such as taking medicines while you are also going to in-person counseling. This can help make quitting easier. What things can I do to make it easier to quit? Quitting smoking might feel very hard at first, but there is a lot that you can do to make it easier. Take these steps:  Talk to your family and friends. Ask them to support and encourage you.  Call phone quitlines, reach out to support groups, or work with a Veterinary surgeoncounselor.  Ask people who smoke to not smoke around you.  Avoid places that make you want (trigger) to smoke, such as: ? Bars. ? Parties. ? Smoke-break areas at work.  Spend time with people  who do not smoke.  Lower the stress in your life. Stress can make you want to smoke. Try these things to help your stress: ? Getting regular exercise. ? Deep-breathing exercises. ? Yoga. ? Meditating. ? Doing a body scan. To do this, close your eyes, focus on one area of your body at a time from head to toe, and notice which parts of your body are tense. Try to relax the muscles in those areas.  Download or buy apps on your mobile phone or tablet that can help you stick to your quit plan. There are many free apps, such as QuitGuide from the [employer] developer(Centers for Disease Control and Prevention). You can find more support from smokefree.gov and other websites.  This information is not intended to replace advice given to you by your health care provider. Make sure you discuss any questions you have with your health care provider. Document Released: 11/27/2008 Document Revised: 09/29/2015 Document Reviewed: 06/17/2014 Elsevier Interactive Patient Education  2018 [employer]

## 2016-10-19 NOTE — Progress Notes (Signed)
Subjective:   Donald Everett is a 76 y.o. male who presents for Medicare Annual/Subsequent preventive examination.  Review of Systems:  Cardiac Risk Factors include: advanced age (>20men, >64 women);diabetes mellitus;dyslipidemia;hypertension;male gender;sedentary lifestyle;smoking/ tobacco exposure     Objective:    Vitals: BP 132/78   Pulse 72   Temp 97.6 F (36.4 C) (Oral)   Ht 5\' 9"  (1.753 m)   Wt 146 lb 0.6 oz (66.2 kg)   BMI 21.57 kg/m   Body mass index is 21.57 kg/m.  Tobacco History  Smoking Status  . Current Every Day Smoker  . Packs/day: 1.00  . Years: 60.00  . Types: Cigarettes  Smokeless Tobacco  . Former Neurosurgeon  . Types: Chew  . Quit date: 10/20/1983    Comment: 1 pack per day      Ready to quit: No Counseling given: Yes   Past Medical History:  Diagnosis Date  . Allergy   . Anxiety   . Arthritis    "joints" (02/25/2013)  . Chronic low back pain   . COPD (chronic obstructive pulmonary disease) (HCC)   . Depression   . ED (erectile dysfunction)   . GERD (gastroesophageal reflux disease)   . Hyperlipidemia   . Hypertension   . Insomnia   . Nicotine addiction   . Peripheral arterial disease (HCC)   . Renal artery stenosis (HCC)   . Type II diabetes mellitus (HCC)    Past Surgical History:  Procedure Laterality Date  . COLONOSCOPY    . COLONOSCOPY N/A 08/28/2015   Procedure: COLONOSCOPY;  Surgeon: Malissa Hippo, MD;  Location: [facility];  Service: Endoscopy;  Laterality: N/A;  10:15 - moved to 10:45 - Ann to notify pt  . FEMORAL ARTERY STENT Right 02/25/2013  . LOWER EXTREMITY ANGIOGRAM Bilateral 02/25/2013   Procedure: LOWER EXTREMITY ANGIOGRAM;  Surgeon: Runell Gess, MD;  Location: [facility];  Service: Cardiovascular;  Laterality: Bilateral;   Family History  Problem Relation Age of Onset  . Diabetes Mother   . Diabetes Father   . Stroke Father   . Diabetes Sister   . CAD Brother        CABG   History  Sexual Activity  .  Sexual activity: No    Outpatient Encounter Prescriptions as of 10/19/2016  Medication Sig  . amLODipine (NORVASC) 10 MG tablet Take 1 tablet (10 mg total) by mouth daily.  Marland Kitchen aspirin EC 325 MG EC tablet Take 1 tablet (325 mg total) by mouth daily.  . B Complex-C (B-COMPLEX WITH VITAMIN C) tablet Take 1 tablet by mouth daily.  . B-D ULTRAFINE III SHORT PEN 31G X 8 MM MISC USE AS DIRECTED.  . fluticasone (FLONASE) 50 MCG/ACT nasal spray Place 1 spray into both nostrils daily.  . Fluticasone-Salmeterol (ADVAIR DISKUS) 100-50 MCG/DOSE AEPB Inhale 1 puff into the lungs 2 (two) times daily.  Marland Kitchen gabapentin (NEURONTIN) 300 MG capsule TAKE 1 CAPSULE BY MOUTH TWICE DAILY AND 2 CAPSULES AT BEDTIME.  Marland Kitchen HYDROcodone-acetaminophen (NORCO) 7.5-325 MG tablet Take 1 tablet by mouth 2 (two) times daily as needed.  . insulin aspart protamine- aspart (NOVOLOG MIX 70/30) (70-30) 100 UNIT/ML injection Inject 0.1 mLs (10 Units total) into the skin 2 (two) times daily with a meal. (Patient taking differently: Inject 10 Units into the skin 2 (two) times daily with a meal. 25 in am and 10 at night.)  . LANTUS SOLOSTAR 100 UNIT/ML Solostar Pen INJECT 7 UNITS UNDER THE SKIN EVERY  MORNING (MAX 15 UNITS).  Marland Kitchen. losartan (COZAAR) 100 MG tablet TAKE ONE TABLET BY MOUTH DAILY.  . multivitamin-lutein (OCUVITE-LUTEIN) CAPS capsule Take 1 capsule by mouth daily.  . ONE TOUCH ULTRA TEST test strip USE TO TEST THREE TIMES DAILY.  Marland Kitchen. ONETOUCH DELICA LANCETS 33G MISC USE TO TEST THREE TIMES DAILY.  . pantoprazole (PROTONIX) 40 MG tablet TAKE ONE TABLET BY MOUTH DAILY.  Marland Kitchen. polyethylene glycol (MIRALAX / GLYCOLAX) packet Take 17 g by mouth daily as needed for mild constipation. Mix with 8 oz of water.  . pravastatin (PRAVACHOL) 20 MG tablet Take 1 tablet (20 mg total) by mouth every morning.  . sitaGLIPtin (JANUVIA) 100 MG tablet Take 1 tablet (100 mg total) by mouth daily.  Marland Kitchen. spironolactone (ALDACTONE) 50 MG tablet TAKE ONE TABLET BY MOUTH  TWICE DAILY.  . tamsulosin (FLOMAX) 0.4 MG CAPS capsule TAKE ONE CAPSULE BY MOUTH DAILY.  Marland Kitchen. temazepam (RESTORIL) 15 MG capsule TAKE ONE CAPSULE BY MOUTH AT BEDTIME AS NEEDED FOR SLEEP.  . [DISCONTINUED] HYDROcodone-acetaminophen (NORCO) 7.5-325 MG tablet Take 1 tablet by mouth 2 (two) times daily as needed.  . [DISCONTINUED] HYDROcodone-acetaminophen (NORCO) 7.5-325 MG tablet Take 1 tablet by mouth 2 (two) times daily as needed.  . [EXPIRED] cyanocobalamin ((VITAMIN B-12)) injection 1,000 mcg   . [DISCONTINUED] cyanocobalamin ((VITAMIN B-12)) injection 1,000 mcg    No facility-administered encounter medications on file as of 10/19/2016.     Activities of Daily Living In your present state of health, do you have any difficulty performing the following activities: 10/19/2016 01/26/2016  Hearing? Y Y  Comment patient does not feel it is bad enough at this point -  Vision? N N  Difficulty concentrating or making decisions? N Y  Walking or climbing stairs? N N  Dressing or bathing? N N  Doing errands, shopping? Y N  Comment doesn't drive very much anymore Chief Operating Officer-  Preparing Food and eating ? N N  Using the Toilet? N N  In the past six months, have you accidently leaked urine? N N  Do you have problems with loss of bowel control? N N  Managing your Medications? N N  Managing your Finances? N N  Housekeeping or managing your Housekeeping? N N  Some recent data might be hidden    Patient Care Team: Kerri PerchesSimpson, Margaret E, MD as PCP - General Rehman, Joline MaxcyNajeeb U, MD as Consulting Physician (Gastroenterology) Candyce ChurnSubramanian, Arun, OD (Optometry)   Assessment:    Exercise Activities and Dietary recommendations Current Exercise Habits: The patient does not participate in regular exercise at present, Exercise limited by: None identified  Goals    . Exercise 3x per week (30 min per time)          Starting 01/26/2016 patient would like to increase his exercise to 30 minutes a day 3 times a week.    . Quit  smoking / using tobacco      Fall Risk Fall Risk  10/19/2016 01/26/2016 12/16/2014 01/22/2014 10/23/2013  Falls in the past year? No No No No No  Risk for fall due to : - Impaired vision - - -   Depression Screen PHQ 2/9 Scores 10/19/2016 01/26/2016 12/16/2014 01/22/2014  PHQ - 2 Score 0 0 0 0    Cognitive Function: Normal   6CIT Screen 10/19/2016 01/26/2016  What Year? 0 points 0 points  What month? 0 points 0 points  What time? 0 points 0 points  Count back from 20 0 points 0 points  Months  in reverse 0 points 0 points  Repeat phrase 0 points 2 points  Total Score 0 2    Immunization History  Administered Date(s) Administered  . Influenza Split 11/08/2010, 11/11/2011  . Influenza Whole 11/17/2006  . Influenza,inj,Quad PF,6+ Mos 10/29/2012, 11/28/2013, 12/16/2014, 10/14/2015  . Pneumococcal Conjugate-13 01/22/2014  . Pneumococcal Polysaccharide-23 08/29/2003, 10/07/2010  . Td 08/31/2002  . Tdap 12/16/2014  . Zoster 10/07/2010   Screening Tests Health Maintenance  Topic Date Due  . INFLUENZA VACCINE  09/14/2016  . HEMOGLOBIN A1C  01/19/2017  . OPHTHALMOLOGY EXAM  03/02/2017  . FOOT EXAM  03/12/2017  . TETANUS/TDAP  12/15/2024  . PNA vac Low Risk Adult  Completed      Plan:   I have personally reviewed and noted the following in the patient's chart:   . Medical and social history . Use of alcohol, tobacco or illicit drugs  . Current medications and supplements . Functional ability and status . Nutritional status . Physical activity . Advanced directives . List of other physicians . Hospitalizations, surgeries, and ER visits in previous 12 months . Vitals . Screenings to include cognitive, depression, and falls . Referrals and appointments  In addition, I have reviewed and discussed with patient certain preventive protocols, quality metrics, and best practice recommendations. A written personalized care plan for preventive services as well as general preventive health  recommendations were provided to patient.     Candis Shine, LPN  02/14/9145

## 2016-11-11 DIAGNOSIS — Z794 Long term (current) use of insulin: Secondary | ICD-10-CM | POA: Diagnosis not present

## 2016-11-11 DIAGNOSIS — E119 Type 2 diabetes mellitus without complications: Secondary | ICD-10-CM | POA: Diagnosis not present

## 2016-11-11 DIAGNOSIS — E785 Hyperlipidemia, unspecified: Secondary | ICD-10-CM | POA: Diagnosis not present

## 2016-11-12 LAB — COMPLETE METABOLIC PANEL WITH GFR
AG Ratio: 1.1 (calc) (ref 1.0–2.5)
ALT: 11 U/L (ref 9–46)
AST: 15 U/L (ref 10–35)
Albumin: 3.2 g/dL — ABNORMAL LOW (ref 3.6–5.1)
Alkaline phosphatase (APISO): 58 U/L (ref 40–115)
BUN/Creatinine Ratio: 19 (calc) (ref 6–22)
BUN: 29 mg/dL — AB (ref 7–25)
CALCIUM: 8.8 mg/dL (ref 8.6–10.3)
CO2: 21 mmol/L (ref 20–32)
Chloride: 108 mmol/L (ref 98–110)
Creat: 1.52 mg/dL — ABNORMAL HIGH (ref 0.70–1.18)
GFR, EST AFRICAN AMERICAN: 51 mL/min/{1.73_m2} — AB (ref >=60)
GFR, EST NON AFRICAN AMERICAN: 44 mL/min/{1.73_m2} — AB (ref >=60)
GLOBULIN: 2.8 g/dL (ref 1.9–3.7)
GLUCOSE: 131 mg/dL — AB (ref 65–99)
Potassium: 6.7 mmol/L (ref 3.5–5.3)
SODIUM: 134 mmol/L — AB (ref 135–146)
TOTAL PROTEIN: 6 g/dL — AB (ref 6.1–8.1)
Total Bilirubin: 0.3 mg/dL (ref 0.2–1.2)

## 2016-11-12 LAB — LIPID PANEL
CHOLESTEROL: 101 mg/dL (ref <200)
HDL: 24 mg/dL — ABNORMAL LOW (ref >40)
LDL CHOLESTEROL (CALC): 58 mg/dL
Non-HDL Cholesterol (Calc): 77 mg/dL (calc) (ref <130)
Total CHOL/HDL Ratio: 4.2 (calc) (ref <5.0)
Triglycerides: 106 mg/dL (ref <150)

## 2016-11-12 LAB — MICROALBUMIN / CREATININE URINE RATIO
Creatinine, Urine: 32 mg/dL (ref 20–320)
Microalb Creat Ratio: 22 mcg/mg creat (ref <30)
Microalb, Ur: 0.7 mg/dL

## 2016-11-12 LAB — HEMOGLOBIN A1C
EAG (MMOL/L): 9 (calc)
HEMOGLOBIN A1C: 7.3 %{Hb} — AB (ref <5.7)
MEAN PLASMA GLUCOSE: 163 (calc)

## 2016-11-12 LAB — VITAMIN D 25 HYDROXY (VIT D DEFICIENCY, FRACTURES): VIT D 25 HYDROXY: 37 ng/mL (ref 30–100)

## 2016-11-14 ENCOUNTER — Telehealth: Payer: Self-pay | Admitting: Family Medicine

## 2016-11-14 ENCOUNTER — Other Ambulatory Visit: Payer: Self-pay | Admitting: Family Medicine

## 2016-11-14 DIAGNOSIS — E875 Hyperkalemia: Secondary | ICD-10-CM

## 2016-11-14 MED ORDER — SODIUM POLYSTYRENE SULFONATE 15 GM/60ML PO SUSP: ORAL | 0 refills | Status: DC

## 2016-11-14 NOTE — Telephone Encounter (Signed)
I called Donald Everett about a critical lab value.  Potassium reported at 6.7.  He said he felt well.  He said it had happened to him before.  I recommended he go to the emergency room for treatment.  I explained to him that it could cause a dangerous, potentially fatal heart rhythm.  He refused.  I asked him what medicine he was on to determine if he should change, and he didn't want to do that, either.  He promised to call Dr Lodema Hong on Monday.  He did indicate he would go to the ER over the weekend if he started feeling bad.

## 2016-11-14 NOTE — Progress Notes (Signed)
kayexalaltekayel;a

## 2016-11-14 NOTE — Telephone Encounter (Signed)
Pt notified of need to take two doses of kayexalate, 120 ml x 2 he is to have rept chem 7 drawn this week Thursday , please order ( not sue re which lab he uses, you may need to call him

## 2016-11-16 ENCOUNTER — Other Ambulatory Visit: Payer: Self-pay | Admitting: Family Medicine

## 2016-11-16 NOTE — Telephone Encounter (Signed)
Seen 08/30/16 °

## 2016-11-17 ENCOUNTER — Ambulatory Visit (INDEPENDENT_AMBULATORY_CARE_PROVIDER_SITE_OTHER): Payer: PPO | Admitting: Family Medicine

## 2016-11-17 VITALS — BP 120/60 | HR 76 | Temp 98.2°F | Resp 16 | Ht 69.0 in | Wt 145.0 lb

## 2016-11-17 DIAGNOSIS — D519 Vitamin B12 deficiency anemia, unspecified: Secondary | ICD-10-CM | POA: Diagnosis not present

## 2016-11-17 DIAGNOSIS — I150 Renovascular hypertension: Secondary | ICD-10-CM

## 2016-11-17 DIAGNOSIS — Z23 Encounter for immunization: Secondary | ICD-10-CM

## 2016-11-17 DIAGNOSIS — E119 Type 2 diabetes mellitus without complications: Secondary | ICD-10-CM | POA: Diagnosis not present

## 2016-11-17 DIAGNOSIS — IMO0001 Reserved for inherently not codable concepts without codable children: Secondary | ICD-10-CM

## 2016-11-17 DIAGNOSIS — F17200 Nicotine dependence, unspecified, uncomplicated: Secondary | ICD-10-CM | POA: Diagnosis not present

## 2016-11-17 DIAGNOSIS — E785 Hyperlipidemia, unspecified: Secondary | ICD-10-CM | POA: Diagnosis not present

## 2016-11-17 DIAGNOSIS — G8929 Other chronic pain: Secondary | ICD-10-CM

## 2016-11-17 DIAGNOSIS — Z794 Long term (current) use of insulin: Secondary | ICD-10-CM | POA: Diagnosis not present

## 2016-11-17 DIAGNOSIS — E875 Hyperkalemia: Secondary | ICD-10-CM | POA: Diagnosis not present

## 2016-11-17 LAB — COMPLETE METABOLIC PANEL WITH GFR
AG Ratio: 1.1 (calc) (ref 1.0–2.5)
ALBUMIN MSPROF: 3.3 g/dL — AB (ref 3.6–5.1)
ALKALINE PHOSPHATASE (APISO): 61 U/L (ref 40–115)
ALT: 12 U/L (ref 9–46)
AST: 18 U/L (ref 10–35)
BUN / CREAT RATIO: 15 (calc) (ref 6–22)
BUN: 22 mg/dL (ref 7–25)
CALCIUM: 8.5 mg/dL — AB (ref 8.6–10.3)
CO2: 24 mmol/L (ref 20–32)
CREATININE: 1.48 mg/dL — AB (ref 0.70–1.18)
Chloride: 101 mmol/L (ref 98–110)
GFR, EST NON AFRICAN AMERICAN: 45 mL/min/{1.73_m2} — AB (ref >=60)
GFR, Est African American: 53 mL/min/{1.73_m2} — ABNORMAL LOW (ref >=60)
GLOBULIN: 2.9 g/dL (ref 1.9–3.7)
GLUCOSE: 260 mg/dL — AB (ref 65–139)
Potassium: 5 mmol/L (ref 3.5–5.3)
SODIUM: 134 mmol/L — AB (ref 135–146)
Total Bilirubin: 0.3 mg/dL (ref 0.2–1.2)
Total Protein: 6.2 g/dL (ref 6.1–8.1)

## 2016-11-17 MED ORDER — HYDROCODONE-ACETAMINOPHEN 7.5-325 MG PO TABS: 1.0000 | ORAL_TABLET | Freq: Two times a day (BID) | ORAL | 0 refills | Status: DC | PRN

## 2016-11-17 MED ORDER — HYDROCODONE-ACETAMINOPHEN 7.5-325 MG PO TABS
1.0000 | ORAL_TABLET | Freq: Two times a day (BID) | ORAL | 0 refills | Status: DC | PRN
Start: 2016-11-17 — End: 2016-11-17

## 2016-11-17 MED ORDER — CYANOCOBALAMIN 1000 MCG/ML IJ SOLN
1000.0000 ug | Freq: Once | INTRAMUSCULAR | Status: AC
  Administered 2016-11-17: 1000 ug via INTRAMUSCULAR

## 2016-11-17 MED ORDER — SPIRONOLACTONE 50 MG PO TABS: 50.0000 mg | ORAL_TABLET | Freq: Every day | ORAL | 3 refills | Status: DC

## 2016-11-17 NOTE — Patient Instructions (Addendum)
F/u week of Dec 17, call if you need me sooner  Nurse visit for B12 injection in 4 week also for BP re check B12 1 cc  Today  Reduce spironolactone 50 mg to ONE daily every morning only  Need to cut back smoking and work hard on quitting!  STRONGLY recommend cardiology evaluation as we discussed , may be PREVENTIVE!   Flu vaccine today   Excellent blood sugar and cholesterol  It is important that you exercise regularly at least 30 minutes 5 times a week. If you develop chest pain, have severe difficulty breathing, or feel very tired, stop exercising immediately and seek medical attention        Steps to Quit Smoking Smoking tobacco can be bad for your health. It can also affect almost every organ in your body. Smoking puts you and people around you at risk for many serious long-lasting (chronic) diseases. Quitting smoking is hard, but it is one of the best things that you can do for your health. It is never too late to quit. What are the benefits of quitting smoking? When you quit smoking, you lower your risk for getting serious diseases and conditions. They can include:  Lung cancer or lung disease.  Heart disease.  Stroke.  Heart attack.  Not being able to have children (infertility).  Weak bones (osteoporosis) and broken bones (fractures).  If you have coughing, wheezing, and shortness of breath, those symptoms may get better when you quit. You may also get sick less often. If you are pregnant, quitting smoking can help to lower your chances of having a baby of low birth weight. What can I do to help me quit smoking? Talk with your doctor about what can help you quit smoking. Some things you can do (strategies) include:  Quitting smoking totally, instead of slowly cutting back how much you smoke over a period of time.  Going to in-person counseling. You are more likely to quit if you go to many counseling sessions.  Using resources and support systems, such  as: ? Agricultural engineer with a Veterinary surgeon. ? Phone quitlines. ? Automotive engineer. ? Support groups or group counseling. ? Text messaging programs. ? Mobile phone apps or applications.  Taking medicines. Some of these medicines may have nicotine in them. If you are pregnant or breastfeeding, do not take any medicines to quit smoking unless your doctor says it is okay. Talk with your doctor about counseling or other things that can help you.  Talk with your doctor about using more than one strategy at the same time, such as taking medicines while you are also going to in-person counseling. This can help make quitting easier. What things can I do to make it easier to quit? Quitting smoking might feel very hard at first, but there is a lot that you can do to make it easier. Take these steps:  Talk to your family and friends. Ask them to support and encourage you.  Call phone quitlines, reach out to support groups, or work with a Veterinary surgeon.  Ask people who smoke to not smoke around you.  Avoid places that make you want (trigger) to smoke, such as: ? Bars. ? Parties. ? Smoke-break areas at work.  Spend time with people who do not smoke.  Lower the stress in your life. Stress can make you want to smoke. Try these things to help your stress: ? Getting regular exercise. ? Deep-breathing exercises. ? Yoga. ? Meditating. ? Doing a body  scan. To do this, close your eyes, focus on one area of your body at a time from head to toe, and notice which parts of your body are tense. Try to relax the muscles in those areas.  Download or buy apps on your mobile phone or tablet that can help you stick to your quit plan. There are many free apps, such as QuitGuide from the [employer] developer for Disease Control and Prevention). You can find more support from smokefree.gov and other websites.  This information is not intended to replace advice given to you by your health care provider. Make sure you  discuss any questions you have with your health care provider. Document Released: 11/27/2008 Document Revised: 09/29/2015 Document Reviewed: 06/17/2014 Elsevier Interactive Patient Education  2018 [employer].

## 2016-11-20 ENCOUNTER — Telehealth: Payer: Self-pay | Admitting: Family Medicine

## 2016-11-20 ENCOUNTER — Encounter: Payer: Self-pay | Admitting: Family Medicine

## 2016-11-20 NOTE — Assessment & Plan Note (Signed)
Deteriorated smoking over 1 PPD , no quit date set, counseled for over 5 minutes Patient is asked and  confirms current  Nicotine use.  Five to seven minutes of time is spent in counseling the patient of the need to quit smoking  Advice to quit is delivered clearly specifically in reducing the risk of developing heart disease, having a stroke, or of developing all types of cancer, especially lung and oral cancer. Improvement in breathing and exercise tolerance and quality of life is also discussed, as is the economic benefit.  Assessment of willingness to quit or to make an attempt to quit is made and documented  Assistance in quit attempt is made with several and varied options presented, based on patient's desire and need. These include  literature, local classes available, 1800 QUIT NOW number, OTC and prescription medication.  The GOAL to be NICOTINE FREE is re emphasized.  The patient has set a personal goal of either reduction or discontinuation and follow up is arranged between 6 an 16 weeks.

## 2016-11-20 NOTE — Assessment & Plan Note (Signed)
Hyperlipidemia:Low fat diet discussed and encouraged.   Lipid Panel  Lab Results  Component Value Date   CHOL 101 11/11/2016   HDL 24 (L) 11/11/2016   LDLCALC 48 05/16/2016   TRIG 106 11/11/2016   CHOLHDL 4.2 11/11/2016   Needs to inc activity, no med change

## 2016-11-20 NOTE — Assessment & Plan Note (Signed)
Controlled, no change in medication Donald Everett is reminded of the importance of commitment to daily physical activity for 30 minutes or more, as able and the need to limit carbohydrate intake to 30 to 60 grams per meal to help with blood sugar control.   The need to take medication as prescribed, test blood sugar as directed, and to call between visits if there is a concern that blood sugar is uncontrolled is also discussed.   Donald Everett is reminded of the importance of daily foot exam, annual eye examination, and good blood sugar, blood pressure and cholesterol control.  Diabetic Labs Latest Ref Rng & Units 11/17/2016 11/11/2016 07/25/2016 07/20/2016 05/18/2016  HbA1c <5.7 % of total Hgb - 7.3(H) - 6.9(H) -  Microalbumin mg/dL - 0.7 - - -  Micro/Creat Ratio <30 mcg/mg creat - 22 - - -  Chol <200 mg/dL - 409 - - -  HDL >81 mg/dL - 19(J) - - -  Calc LDL <100 mg/dL - - - - -  Triglycerides <150 mg/dL - 478 - - -  Creatinine 0.70 - 1.18 mg/dL 2.95(A) 2.13(Y) 8.65(H) 1.60(H) 1.62(H)   BP/Weight 11/17/2016 10/19/2016 08/30/2016 07/25/2016 05/19/2016 03/11/2016 01/26/2016  Systolic BP 120 132 128 158 148 116 132  Diastolic BP 60 78 80 70 68 48 58  Wt. (Lbs) 145 146.04 141 144 142 146 138.04  BMI 21.41 21.57 20.82 21.27 20.97 21.56 20.38   Foot/eye exam completion dates 03/11/2016 12/16/2014  Foot Form Completion Done Done

## 2016-11-20 NOTE — Progress Notes (Signed)
Donald Everett     MRN: [id number]      DOB: 04-05-1940   HPI [name] is here for follow up and re-evaluation of chronic medical conditions, medication management and review of any available recent lab and radiology data.  Preventive health is updated, specifically  Cancer screening and Immunization.     .Denies polyuria, polydipsia, blurred vision , or hypoglycemic episodes. Still considering need for cardiology eval in light of increased CAD risk and abn Chest scan Smoking over 1 PPD cigarettes, more than before Sometimes feels light headed, has hyperkalemia on high dose of spironolactone , and relatively low SBP, will reduce dose and re eval  The PT denies any adverse reactions to current medications since the last visit.  There are no new concerns.  Reports adequate pain control on current medication dose  ROS Denies recent fever or chills. Denies sinus pressure, nasal congestion, ear pain or sore throat. Denies chest congestion, productive cough or wheezing. Denies chest pains, palpitations and leg swelling Denies abdominal pain, nausea, vomiting,diarrhea or constipation.   Denies dysuria, frequency, hesitancy or incontinence. Denies uncontrolled joint pain, swelling and limitation in mobility. Denies headaches, seizures, numbness, or tingling. Denies depression,uncontrolled anxiety or insomnia. Denies skin break down or rash.   PE  BP 120/60 (BP Location: Left Arm, Patient Position: Sitting, Cuff Size: Normal)   Pulse 76   Temp 98.2 F (36.8 C) (Other (Comment))   Resp 16   Ht  (1.753 m)   Wt 145 lb (65.8 kg)   SpO2 97%   BMI 21.41 kg/m   Patient alert and oriented and in no cardiopulmonary distress.  HEENT: No facial asymmetry, EOMI,   oropharynx pink and moist.  Neck supple no JVD, no mass.  Chest: Clear to auscultation bilaterally.Decreased air entry  CVS: S1, S2 no murmurs, no S3.Regular rate.  ABD: Soft non tender.   Ext: No edema  MS:  Decreased  ROM spine, shoulders, hips and knees.  Skin: Intact, no ulcerations or rash noted.  Psych: Good eye contact, normal affect. Memory intact not anxious or depressed appearing.  CNS: CN 2-12 intact, power,  normal throughout.no focal deficits noted.   Assessment & Plan  Diabetes mellitus, insulin dependent (IDDM), controlled (HCC) Controlled, no change in medication [name] is reminded of the importance of commitment to daily physical activity for 30 minutes or more, as able and the need to limit carbohydrate intake to 30 to 60 grams per meal to help with blood sugar control.   The need to take medication as prescribed, test blood sugar as directed, and to call between visits if there is a concern that blood sugar is uncontrolled is also discussed.   [name] is reminded of the importance of daily foot exam, annual eye examination, and good blood sugar, blood pressure and cholesterol control.  Diabetic Labs Latest Ref Rng & Units 11/17/2016 11/11/2016 07/25/2016 07/20/2016 05/18/2016  HbA1c <5.7 % of total Hgb - 7.3(H) - 6.9(H) -  Microalbumin mg/dL - 0.7 - - -  Micro/Creat Ratio <30 mcg/mg creat - 22 - - -  Chol <200 mg/dL - 409 - - -  HDL >81 mg/dL - 19(J) - - -  Calc LDL <100 mg/dL - - - - -  Triglycerides <150 mg/dL - 478 - - -  Creatinine 0.70 - 1.18 mg/dL 2.95(A) 2.13(Y) 8.65(H) 1.60(H) 1.62(H)   BP/Weight 11/17/2016 10/19/2016 08/30/2016 07/25/2016 05/19/2016 03/11/2016 01/26/2016  Systolic BP 120 132 128 158 148 116  132  Diastolic BP 60 78 80 70 68 48 58  Wt. (Lbs) 145 146.04 141 144 142 146 138.04  BMI 21.41 21.57 20.82 21.27 20.97 21.56 20.38   Foot/eye exam completion dates 03/11/2016 12/16/2014  Foot Form Completion Done Done        Hyperlipidemia LDL goal <100 Hyperlipidemia:Low fat diet discussed and encouraged.   Lipid Panel  Lab Results  Component Value Date   CHOL 101 11/11/2016   HDL 24 (L) 11/11/2016   LDLCALC 48 05/16/2016   TRIG 106 11/11/2016    CHOLHDL 4.2 11/11/2016   Needs to inc activity, no med change    NICOTINE ADDICTION Deteriorated smoking over 1 PPD , no quit date set, counseled for over 5 minutes Patient is asked and  confirms current  Nicotine use.  Five to seven minutes of time is spent in counseling the patient of the need to quit smoking  Advice to quit is delivered clearly specifically in reducing the risk of developing heart disease, having a stroke, or of developing all types of cancer, especially lung and oral cancer. Improvement in breathing and exercise tolerance and quality of life is also discussed, as is the economic benefit.  Assessment of willingness to quit or to make an attempt to quit is made and documented  Assistance in quit attempt is made with several and varied options presented, based on patient's desire and need. These include  literature, local classes available, 1800 QUIT NOW number, OTC and prescription medication.  The GOAL to be NICOTINE FREE is re emphasized.  The patient has set a personal goal of either reduction or discontinuation and follow up is arranged between 6 an 16 weeks.    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 The importance of attaining a healthy weight is also discussed.  BP/Weight 11/17/2016 10/19/2016 08/30/2016 07/25/2016 05/19/2016 03/11/2016 01/26/2016  Systolic BP 120 132 128 158 148 116 132  Diastolic BP 60 78 80 70 68 48 58  Wt. (Lbs) 145 146.04 141 144 142 146 138.04  BMI 21.41 21.57 20.82 21.27 20.97 21.56 20.38     Recheck bP in 4 weeks  B12 deficiency anemia B12 administered at visit 1 cc IM  Encounter for chronic pain management The patient's Controlled Substance registry is reviewed and compliance confirmed. Adequacy of  Pain control and level of  function is assessed. Medication dosing is adjusted as deemed appropriate. Twelve weeks of medication is prescribed , patient signs for the script and is provided with a follow up appointment between 11 to 12 weeks .

## 2016-11-20 NOTE — Telephone Encounter (Signed)
pls contact pt and let him know that the blood test he had done to check the potassium level was good in that the potassium level had  normalized

## 2016-11-20 NOTE — Assessment & Plan Note (Signed)
Over corrected with recurrent hyperkalemia, reduce spironolactone dose,  DASH diet and commitment to daily physical activity for a minimum of 30 minutes discussed and encouraged, as a part of hypertension management. The importance of attaining a healthy weight is also discussed.  BP/Weight 11/17/2016 10/19/2016 08/30/2016 07/25/2016 05/19/2016 03/11/2016 01/26/2016  Systolic BP 120 132 128 158 148 116 132  Diastolic BP 60 78 80 70 68 48 58  Wt. (Lbs) 145 146.04 141 144 142 146 138.04  BMI 21.41 21.57 20.82 21.27 20.97 21.56 20.38     Recheck bP in 4 weeks

## 2016-11-20 NOTE — Assessment & Plan Note (Signed)
The patient's Controlled Substance registry is reviewed and compliance confirmed. Adequacy of  Pain control and level of function is assessed. Medication dosing is adjusted as deemed appropriate. Twelve weeks of medication is prescribed , patient signs for the script and is provided with a follow up appointment between 11 to 12 weeks .  

## 2016-11-20 NOTE — Assessment & Plan Note (Signed)
B12 administered at visit 1 cc IM

## 2016-11-21 NOTE — Telephone Encounter (Signed)
Left message

## 2016-11-22 ENCOUNTER — Encounter: Payer: Self-pay | Admitting: Family Medicine

## 2016-11-28 ENCOUNTER — Other Ambulatory Visit: Payer: Self-pay | Admitting: Family Medicine

## 2016-11-28 NOTE — Telephone Encounter (Signed)
Seen 10 [phone] 18 

## 2016-12-19 ENCOUNTER — Ambulatory Visit (INDEPENDENT_AMBULATORY_CARE_PROVIDER_SITE_OTHER): Payer: PPO

## 2016-12-19 DIAGNOSIS — D519 Vitamin B12 deficiency anemia, unspecified: Secondary | ICD-10-CM

## 2016-12-19 MED ORDER — CYANOCOBALAMIN 1000 MCG/ML IJ SOLN
1000.0000 ug | Freq: Once | INTRAMUSCULAR | Status: AC
  Administered 2016-12-19: 1000 ug via INTRAMUSCULAR

## 2016-12-26 ENCOUNTER — Other Ambulatory Visit: Payer: Self-pay | Admitting: Family Medicine

## 2017-01-04 ENCOUNTER — Other Ambulatory Visit: Payer: Self-pay | Admitting: Family Medicine

## 2017-01-04 DIAGNOSIS — J302 Other seasonal allergic rhinitis: Secondary | ICD-10-CM

## 2017-01-09 ENCOUNTER — Other Ambulatory Visit: Payer: Self-pay

## 2017-01-09 ENCOUNTER — Telehealth: Payer: Self-pay | Admitting: *Deleted

## 2017-01-09 MED ORDER — TEMAZEPAM 15 MG PO CAPS: 15.0000 mg | ORAL_CAPSULE | Freq: Every evening | ORAL | 0 refills | Status: DC | PRN

## 2017-01-09 MED ORDER — FLUTICASONE-SALMETEROL 100-50 MCG/DOSE IN AEPB: 1.0000 | INHALATION_SPRAY | Freq: Two times a day (BID) | RESPIRATORY_TRACT | 3 refills | Status: DC

## 2017-01-09 NOTE — Telephone Encounter (Signed)
Patient called stating he needs his sleeping pills and nasal spray refilled to [facility]

## 2017-01-09 NOTE — Telephone Encounter (Signed)
Med refilled.

## 2017-01-13 ENCOUNTER — Other Ambulatory Visit: Payer: Self-pay | Admitting: Family Medicine

## 2017-01-17 ENCOUNTER — Ambulatory Visit (INDEPENDENT_AMBULATORY_CARE_PROVIDER_SITE_OTHER): Payer: PPO

## 2017-01-17 DIAGNOSIS — D519 Vitamin B12 deficiency anemia, unspecified: Secondary | ICD-10-CM | POA: Diagnosis not present

## 2017-01-17 MED ORDER — CYANOCOBALAMIN 1000 MCG/ML IJ SOLN
1000.0000 ug | Freq: Once | INTRAMUSCULAR | Status: AC
  Administered 2017-01-17: 1000 ug via INTRAMUSCULAR

## 2017-01-30 ENCOUNTER — Other Ambulatory Visit: Payer: Self-pay | Admitting: Family Medicine

## 2017-01-31 ENCOUNTER — Ambulatory Visit (INDEPENDENT_AMBULATORY_CARE_PROVIDER_SITE_OTHER): Payer: PPO | Admitting: Family Medicine

## 2017-01-31 ENCOUNTER — Encounter: Payer: Self-pay | Admitting: Family Medicine

## 2017-01-31 VITALS — BP 138/70 | HR 75 | Resp 16 | Ht 69.0 in | Wt 145.0 lb

## 2017-01-31 DIAGNOSIS — IMO0001 Reserved for inherently not codable concepts without codable children: Secondary | ICD-10-CM

## 2017-01-31 DIAGNOSIS — N521 Erectile dysfunction due to diseases classified elsewhere: Secondary | ICD-10-CM | POA: Diagnosis not present

## 2017-01-31 DIAGNOSIS — Z794 Long term (current) use of insulin: Secondary | ICD-10-CM | POA: Diagnosis not present

## 2017-01-31 DIAGNOSIS — I25119 Atherosclerotic heart disease of native coronary artery with unspecified angina pectoris: Secondary | ICD-10-CM | POA: Diagnosis not present

## 2017-01-31 DIAGNOSIS — G8929 Other chronic pain: Secondary | ICD-10-CM

## 2017-01-31 DIAGNOSIS — E1140 Type 2 diabetes mellitus with diabetic neuropathy, unspecified: Secondary | ICD-10-CM | POA: Diagnosis not present

## 2017-01-31 DIAGNOSIS — E785 Hyperlipidemia, unspecified: Secondary | ICD-10-CM | POA: Diagnosis not present

## 2017-01-31 DIAGNOSIS — I150 Renovascular hypertension: Secondary | ICD-10-CM

## 2017-01-31 DIAGNOSIS — E119 Type 2 diabetes mellitus without complications: Secondary | ICD-10-CM | POA: Diagnosis not present

## 2017-01-31 DIAGNOSIS — F17200 Nicotine dependence, unspecified, uncomplicated: Secondary | ICD-10-CM

## 2017-01-31 DIAGNOSIS — D519 Vitamin B12 deficiency anemia, unspecified: Secondary | ICD-10-CM

## 2017-01-31 MED ORDER — LOSARTAN POTASSIUM 100 MG PO TABS: 100.0000 mg | ORAL_TABLET | Freq: Every day | ORAL | 1 refills | Status: DC

## 2017-01-31 MED ORDER — PANTOPRAZOLE SODIUM 40 MG PO TBEC: 40.0000 mg | DELAYED_RELEASE_TABLET | Freq: Every day | ORAL | 1 refills | Status: DC

## 2017-01-31 MED ORDER — HYDROCODONE-ACETAMINOPHEN 7.5-325 MG PO TABS: 1.0000 | ORAL_TABLET | Freq: Two times a day (BID) | ORAL | 0 refills | Status: DC | PRN

## 2017-01-31 NOTE — Patient Instructions (Addendum)
F/u I week of April 15,April call if you need me before   90 days of medication will be prescribed to be dispensed starting Mar 06, 2016  Non fast CBC, B12 , chem 7 and EGFR, HBA1C in February the day you comne for your B12 injection  Please seriously consider having heart Docs check you  Let's work at 75% pack per day of cigarettes  All the best for 2019!       Steps to Quit Smoking Smoking tobacco can be bad for your health. It can also affect almost every organ in your body. Smoking puts you and people around you at risk for many serious long-lasting (chronic) diseases. Quitting smoking is hard, but it is one of the best things that you can do for your health. It is never too late to quit. What are the benefits of quitting smoking? When you quit smoking, you lower your risk for getting serious diseases and conditions. They can include:  Lung cancer or lung disease.  Heart disease.  Stroke.  Heart attack.  Not being able to have children (infertility).  Weak bones (osteoporosis) and broken bones (fractures).  If you have coughing, wheezing, and shortness of breath, those symptoms may get better when you quit. You may also get sick less often. If you are pregnant, quitting smoking can help to lower your chances of having a baby of low birth weight. What can I do to help me quit smoking? Talk with your doctor about what can help you quit smoking. Some things you can do (strategies) include:  Quitting smoking totally, instead of slowly cutting back how much you smoke over a period of time.  Going to in-person counseling. You are more likely to quit if you go to many counseling sessions.  Using resources and support systems, such as: ? Database administrator with a Social worker. ? Phone quitlines. ? Careers information officer. ? Support groups or group counseling. ? Text messaging programs. ? Mobile phone apps or applications.  Taking medicines. Some of these medicines may have  nicotine in them. If you are pregnant or breastfeeding, do not take any medicines to quit smoking unless your doctor says it is okay. Talk with your doctor about counseling or other things that can help you.  Talk with your doctor about using more than one strategy at the same time, such as taking medicines while you are also going to in-person counseling. This can help make quitting easier. What things can I do to make it easier to quit? Quitting smoking might feel very hard at first, but there is a lot that you can do to make it easier. Take these steps:  Talk to your family and friends. Ask them to support and encourage you.  Call phone quitlines, reach out to support groups, or work with a Social worker.  Ask people who smoke to not smoke around you.  Avoid places that make you want (trigger) to smoke, such as: ? Bars. ? Parties. ? Smoke-break areas at work.  Spend time with people who do not smoke.  Lower the stress in your life. Stress can make you want to smoke. Try these things to help your stress: ? Getting regular exercise. ? Deep-breathing exercises. ? Yoga. ? Meditating. ? Doing a body scan. To do this, close your eyes, focus on one area of your body at a time from head to toe, and notice which parts of your body are tense. Try to relax the muscles in those areas.  Download or buy apps on your mobile phone or tablet that can help you stick to your quit plan. There are many free apps, such as QuitGuide from the [employer] manager for Disease Control and Prevention). You can find more support from smokefree.gov and other websites.  This information is not intended to replace advice given to you by your health care provider. Make sure you discuss any questions you have with your health care provider. Document Released: 11/27/2008 Document Revised: 09/29/2015 Document Reviewed: 06/17/2014 Elsevier Interactive Patient Education  2018 [employer].

## 2017-02-03 DIAGNOSIS — I2510 Atherosclerotic heart disease of native coronary artery without angina pectoris: Secondary | ICD-10-CM | POA: Insufficient documentation

## 2017-02-03 NOTE — Progress Notes (Signed)
Donald Everett     MRN: [id number]      DOB: 10/30/1940   HPI Donald Everett is here for follow up and re-evaluation of chronic medical conditions, medication management including chronic pain management ,and review of any available recent lab and radiology data.  Preventive health is updated, specifically  Cancer screening and Immunization.   Questions or concerns regarding consultations or procedures which the PT has had in the interim are  addressed. The PT denies any adverse reactions to current medications since the last visit.  There are no new concerns.  There are no specific complaints  Denies polyuria, polydipsia, blurred vision , or hypoglycemic episodes.   ROS Denies recent fever or chills. Denies sinus pressure, nasal congestion, ear pain or sore throat. Denies chest congestion, productive cough or wheezing. Denies chest pains, palpitations and leg swelling Denies abdominal pain, nausea, vomiting,diarrhea or constipation.   Denies dysuria, frequency, hesitancy or incontinence. Denies uncontrolled joint pain, swelling and limitation in mobility. Denies headaches, seizures, numbness, or tingling. Denies depression uncontrolled  anxiety or insomnia. Denies skin break down or rash.   PE  BP 138/70   Pulse 75   Resp 16   Ht 5\' 9"  (1.753 m)   Wt 145 lb (65.8 kg)   SpO2 95%   BMI 21.41 kg/m   Patient alert and oriented and in no cardiopulmonary distress.  HEENT: No facial asymmetry, EOMI,   oropharynx pink and moist.  Neck decereased though adequate  no JVD, no mass.  Chest: Clear to auscultation bilaterally.Decreased air entry  CVS: S1, S2 no murmurs, no S3.Regular rate.  ABD: Soft non tender.   Ext: No edema  MS: Decreased ROM spine, shoulders, hips and knees.  Skin: Intact, no ulcerations or rash noted.  Psych: Good eye contact, normal affect. Memory intact not anxious or depressed appearing.  CNS: CN 2-12 intact, power,  normal throughout.no focal  deficits noted.   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   The need to take medication as prescribed, test blood sugar as directed, and to call between visits if there is a concern that blood sugar is uncontrolled is also discussed.   Donald Everett is reminded of the importance of daily foot exam, annual eye examination, and good blood sugar, blood pressure and cholesterol control. Controlled, no change in medication   Diabetic Labs Latest Ref Rng & Units 11/17/2016 11/11/2016 07/25/2016 07/20/2016 05/18/2016  HbA1c <5.7 % of total Hgb - 7.3(H) - 6.9(H) -  Microalbumin mg/dL - 0.7 - - -  Micro/Creat Ratio <30 mcg/mg creat - 22 - - -  Chol <200 mg/dL - 409101 - - -  HDL >81>40 mg/dL - 19(J24(L) - - -  Calc LDL <100 mg/dL - - - - -  Triglycerides <150 mg/dL - 478106 - - -  Creatinine 0.70 - 1.18 mg/dL 2.95(A1.48(H) 2.13(Y1.52(H) 8.65(H1.51(H) 1.60(H) 1.62(H)   BP/Weight 01/31/2017 11/17/2016 10/19/2016 08/30/2016 07/25/2016 05/19/2016 03/11/2016  Systolic BP 138 120 132 128 158 148 116  Diastolic BP 70 60 78 80 70 68 48  Wt. (Lbs) 145 145 146.04 141 144 142 146  BMI 21.41 21.41 21.57 20.82 21.27 20.97 21.56   Foot/eye exam completion dates 03/11/2016 12/16/2014  Foot Form  Completion Done Done        Hyperlipidemia LDL goal <100 Hyperlipidemia:Low fat diet discussed and encouraged.   Lipid Panel  Lab Results  Component Value Date   CHOL 101 11/11/2016   HDL 24 (L) 11/11/2016   LDLCALC 48 05/16/2016   TRIG 106 11/11/2016   CHOLHDL 4.2 11/11/2016     Updated lab needed at/ before next visit.   Renovascular hypertension Controlled, no change in medication DASH diet and commitment to daily physical activity for a minimum of 30 minutes discussed and  encouraged, as a part of hypertension management. The importance of attaining a healthy weight is also discussed.  BP/Weight 01/31/2017 11/17/2016 10/19/2016 08/30/2016 07/25/2016 05/19/2016 03/11/2016  Systolic BP 138 120 132 128 158 148 116  Diastolic BP 70 60 78 80 70 68 48  Wt. (Lbs) 145 145 146.04 141 144 142 146  BMI 21.41 21.41 21.57 20.82 21.27 20.97 21.56       NICOTINE ADDICTION Patient is asked and  confirms current  Nicotine use.  Five to seven minutes of time is spent in counseling the patient of the need to quit smoking  Advice to quit is delivered clearly specifically in reducing the risk of developing heart disease, having a stroke, or of developing all types of cancer, especially lung and oral cancer. Improvement in breathing and exercise tolerance and quality of life is also discussed, as is the economic benefit.  Assessment of willingness to quit or to make an attempt to quit is made and documented  Assistance in quit attempt is made with several and varied options presented, based on patient's desire and need. These include  literature, local classes available, 1800 QUIT NOW number, OTC and prescription medication.  The GOAL to be NICOTINE FREE is re emphasized.  The patient has set a personal goal of either reduction or discontinuation and follow up is arranged between 6 an 16 weeks.  Current 1PPD, aim to be at 3815 / day by April  Encounter for chronic pain management The patient's Controlled Substance registry is reviewed and compliance confirmed. Adequacy of  Pain control and level of function is assessed. Medication dosing is adjusted as deemed appropriate. Twelve weeks of medication is prescribed , patient signs for the script and is provided with a follow up appointment between 11 to 12 weeks .   B12 deficiency anemia Will check level at next draw, and he continues monthly injections  CAD (coronary atherosclerotic disease) documented CAD on imaging study,  however, pt has resisted and continues to resist cardiology evaluation despite his extremely high risk factors , he is again encouraged to have cardiology evaluation, and is still considering this

## 2017-02-03 NOTE — Assessment & Plan Note (Signed)
Donald Everett is reminded of the importance of commitment to daily physical activity for 30 minutes or more, as able and the need to limit carbohydrate intake to 30 to 60 grams per meal to help with blood sugar control.   The need to take medication as prescribed, test blood sugar as directed, and to call between visits if there is a concern that blood sugar is uncontrolled is also discussed.   Donald Everett is reminded of the importance of daily foot exam, annual eye examination, and good blood sugar, blood pressure and cholesterol control. Controlled, no change in medication   Diabetic Labs Latest Ref Rng & Units 11/17/2016 11/11/2016 07/25/2016 07/20/2016 05/18/2016  HbA1c <5.7 % of total Hgb - 7.3(H) - 6.9(H) -  Microalbumin mg/dL - 0.7 - - -  Micro/Creat Ratio <30 mcg/mg creat - 22 - - -  Chol <200 mg/dL - 161101 - - -  HDL >09>40 mg/dL - 60(A24(L) - - -  Calc LDL <100 mg/dL - - - - -  Triglycerides <150 mg/dL - 540106 - - -  Creatinine 0.70 - 1.18 mg/dL 9.81(X1.48(H) 9.14(N1.52(H) 8.29(F1.51(H) 1.60(H) 1.62(H)   BP/Weight 01/31/2017 11/17/2016 10/19/2016 08/30/2016 07/25/2016 05/19/2016 03/11/2016  Systolic BP 138 120 132 128 158 148 116  Diastolic BP 70 60 78 80 70 68 48  Wt. (Lbs) 145 145 146.04 141 144 142 146  BMI 21.41 21.41 21.57 20.82 21.27 20.97 21.56   Foot/eye exam completion dates 03/11/2016 12/16/2014  Foot Form Completion Done Done

## 2017-02-03 NOTE — Assessment & Plan Note (Signed)
documented CAD on imaging study, however, pt has resisted and continues to resist cardiology evaluation despite his extremely high risk factors , he is again encouraged to have cardiology evaluation, and is still considering this

## 2017-02-03 NOTE — Assessment & Plan Note (Signed)
Patient is asked and  confirms current  Nicotine use.  Five to seven minutes of time is spent in counseling the patient of the need to quit smoking  Advice to quit is delivered clearly specifically in reducing the risk of developing heart disease, having a stroke, or of developing all types of cancer, especially lung and oral cancer. Improvement in breathing and exercise tolerance and quality of life is also discussed, as is the economic benefit.  Assessment of willingness to quit or to make an attempt to quit is made and documented  Assistance in quit attempt is made with several and varied options presented, based on patient's desire and need. These include  literature, local classes available, 1800 QUIT NOW number, OTC and prescription medication.  The GOAL to be NICOTINE FREE is re emphasized.  The patient has set a personal goal of either reduction or discontinuation and follow up is arranged between 6 an 16 weeks.  Current 1PPD, aim to be at 6315 / day by April

## 2017-02-03 NOTE — Assessment & Plan Note (Signed)
Will check level at next draw, and he continues monthly injections

## 2017-02-03 NOTE — Assessment & Plan Note (Signed)
The patient's Controlled Substance registry is reviewed and compliance confirmed. Adequacy of  Pain control and level of function is assessed. Medication dosing is adjusted as deemed appropriate. Twelve weeks of medication is prescribed , patient signs for the script and is provided with a follow up appointment between 11 to 12 weeks .  

## 2017-02-03 NOTE — Assessment & Plan Note (Signed)
Hyperlipidemia:Low fat diet discussed and encouraged.   Lipid Panel  Lab Results  Component Value Date   CHOL 101 11/11/2016   HDL 24 (L) 11/11/2016   LDLCALC 48 05/16/2016   TRIG 106 11/11/2016   CHOLHDL 4.2 11/11/2016     Updated lab needed at/ before next visit.

## 2017-02-03 NOTE — Assessment & Plan Note (Signed)
Controlled, no change in medication DASH diet and commitment to daily physical activity for a minimum of 30 minutes discussed and encouraged, as a part of hypertension management. The importance of attaining a healthy weight is also discussed.  BP/Weight 01/31/2017 11/17/2016 10/19/2016 08/30/2016 07/25/2016 05/19/2016 03/11/2016  Systolic BP 138 120 132 128 158 148 116  Diastolic BP 70 60 78 80 70 68 48  Wt. (Lbs) 145 145 146.04 141 144 142 146  BMI 21.41 21.41 21.57 20.82 21.27 20.97 21.56

## 2017-02-15 ENCOUNTER — Ambulatory Visit (INDEPENDENT_AMBULATORY_CARE_PROVIDER_SITE_OTHER): Payer: PPO

## 2017-02-15 DIAGNOSIS — D519 Vitamin B12 deficiency anemia, unspecified: Secondary | ICD-10-CM | POA: Diagnosis not present

## 2017-02-15 MED ORDER — CYANOCOBALAMIN 1000 MCG/ML IJ SOLN
1000.0000 ug | Freq: Once | INTRAMUSCULAR | Status: AC
  Administered 2017-02-15: 1000 ug via INTRAMUSCULAR

## 2017-02-15 NOTE — Progress Notes (Signed)
Patient came in today for q monthly B-12 injection.

## 2017-02-27 ENCOUNTER — Other Ambulatory Visit: Payer: Self-pay | Admitting: Family Medicine

## 2017-03-23 ENCOUNTER — Ambulatory Visit (INDEPENDENT_AMBULATORY_CARE_PROVIDER_SITE_OTHER): Payer: PPO

## 2017-03-23 DIAGNOSIS — Z794 Long term (current) use of insulin: Secondary | ICD-10-CM | POA: Diagnosis not present

## 2017-03-23 DIAGNOSIS — D519 Vitamin B12 deficiency anemia, unspecified: Secondary | ICD-10-CM

## 2017-03-23 DIAGNOSIS — E119 Type 2 diabetes mellitus without complications: Secondary | ICD-10-CM | POA: Diagnosis not present

## 2017-03-24 LAB — BASIC METABOLIC PANEL WITH GFR
BUN/Creatinine Ratio: 19 (calc) (ref 6–22)
BUN: 25 mg/dL (ref 7–25)
CALCIUM: 9.1 mg/dL (ref 8.6–10.3)
CO2: 23 mmol/L (ref 20–32)
Chloride: 106 mmol/L (ref 98–110)
Creat: 1.32 mg/dL — ABNORMAL HIGH (ref 0.70–1.18)
GFR, EST NON AFRICAN AMERICAN: 52 mL/min/{1.73_m2} — AB (ref >=60)
GFR, Est African American: 60 mL/min/{1.73_m2} (ref >=60)
Glucose, Bld: 297 mg/dL — ABNORMAL HIGH (ref 65–139)
Potassium: 5.6 mmol/L — ABNORMAL HIGH (ref 3.5–5.3)
Sodium: 135 mmol/L (ref 135–146)

## 2017-03-24 LAB — HEMOGLOBIN A1C
EAG (MMOL/L): 9.3 (calc)
Hgb A1c MFr Bld: 7.5 % of total Hgb — ABNORMAL HIGH (ref <5.7)
Mean Plasma Glucose: 169 (calc)

## 2017-03-24 LAB — CBC
HCT: 36 % — ABNORMAL LOW (ref 38.5–50.0)
HEMOGLOBIN: 11.4 g/dL — AB (ref 13.2–17.1)
MCH: 27.3 pg (ref 27.0–33.0)
MCHC: 31.7 g/dL — ABNORMAL LOW (ref 32.0–36.0)
MCV: 86.1 fL (ref 80.0–100.0)
MPV: 10.8 fL (ref 7.5–12.5)
Platelets: 248 10*3/uL (ref 140–400)
RBC: 4.18 10*6/uL — AB (ref 4.20–5.80)
RDW: 15 % (ref 11.0–15.0)
WBC: 10.1 10*3/uL (ref 3.8–10.8)

## 2017-03-24 LAB — VITAMIN B12: VITAMIN B 12: 829 pg/mL (ref 200–1100)

## 2017-03-24 MED ORDER — CYANOCOBALAMIN 1000 MCG/ML IJ SOLN: 1000.0000 ug | Freq: Once | INTRAMUSCULAR | Status: DC

## 2017-03-24 NOTE — Progress Notes (Signed)
Received b12 with no complications IM

## 2017-03-27 ENCOUNTER — Encounter: Payer: Self-pay | Admitting: Family Medicine

## 2017-04-07 ENCOUNTER — Other Ambulatory Visit: Payer: Self-pay | Admitting: Family Medicine

## 2017-04-13 ENCOUNTER — Other Ambulatory Visit: Payer: Self-pay

## 2017-04-13 DIAGNOSIS — IMO0001 Reserved for inherently not codable concepts without codable children: Secondary | ICD-10-CM

## 2017-04-13 DIAGNOSIS — E119 Type 2 diabetes mellitus without complications: Principal | ICD-10-CM

## 2017-04-13 DIAGNOSIS — Z794 Long term (current) use of insulin: Principal | ICD-10-CM

## 2017-04-18 DIAGNOSIS — H2513 Age-related nuclear cataract, bilateral: Secondary | ICD-10-CM | POA: Diagnosis not present

## 2017-04-18 DIAGNOSIS — H5203 Hypermetropia, bilateral: Secondary | ICD-10-CM | POA: Diagnosis not present

## 2017-04-18 DIAGNOSIS — E119 Type 2 diabetes mellitus without complications: Secondary | ICD-10-CM | POA: Diagnosis not present

## 2017-04-18 DIAGNOSIS — Z794 Long term (current) use of insulin: Secondary | ICD-10-CM | POA: Diagnosis not present

## 2017-04-18 LAB — HM DIABETES EYE EXAM

## 2017-04-20 ENCOUNTER — Ambulatory Visit (INDEPENDENT_AMBULATORY_CARE_PROVIDER_SITE_OTHER): Payer: PPO

## 2017-04-20 DIAGNOSIS — D519 Vitamin B12 deficiency anemia, unspecified: Secondary | ICD-10-CM

## 2017-04-20 MED ORDER — CYANOCOBALAMIN 1000 MCG/ML IJ SOLN
1000.0000 ug | Freq: Once | INTRAMUSCULAR | Status: AC
  Administered 2017-04-20: 1000 ug via INTRAMUSCULAR

## 2017-04-26 ENCOUNTER — Other Ambulatory Visit: Payer: Self-pay | Admitting: Family Medicine

## 2017-05-17 ENCOUNTER — Telehealth: Payer: Self-pay | Admitting: Family Medicine

## 2017-05-17 ENCOUNTER — Ambulatory Visit: Payer: PPO

## 2017-05-17 NOTE — Telephone Encounter (Signed)
Patient is requesting refill for hydrocodone.

## 2017-05-17 NOTE — Telephone Encounter (Signed)
His appt was moved up so he wouldn't run out of his med

## 2017-05-31 ENCOUNTER — Ambulatory Visit: Payer: PPO | Admitting: Family Medicine

## 2017-06-01 ENCOUNTER — Ambulatory Visit (INDEPENDENT_AMBULATORY_CARE_PROVIDER_SITE_OTHER): Payer: PPO | Admitting: Family Medicine

## 2017-06-01 ENCOUNTER — Encounter: Payer: Self-pay | Admitting: Family Medicine

## 2017-06-01 VITALS — BP 140/62 | HR 75 | Resp 16 | Ht 69.0 in | Wt 140.0 lb

## 2017-06-01 DIAGNOSIS — Z794 Long term (current) use of insulin: Secondary | ICD-10-CM

## 2017-06-01 DIAGNOSIS — F5105 Insomnia due to other mental disorder: Secondary | ICD-10-CM | POA: Diagnosis not present

## 2017-06-01 DIAGNOSIS — E119 Type 2 diabetes mellitus without complications: Secondary | ICD-10-CM | POA: Diagnosis not present

## 2017-06-01 DIAGNOSIS — E785 Hyperlipidemia, unspecified: Secondary | ICD-10-CM

## 2017-06-01 DIAGNOSIS — F419 Anxiety disorder, unspecified: Secondary | ICD-10-CM | POA: Diagnosis not present

## 2017-06-01 DIAGNOSIS — F17200 Nicotine dependence, unspecified, uncomplicated: Secondary | ICD-10-CM

## 2017-06-01 DIAGNOSIS — I150 Renovascular hypertension: Secondary | ICD-10-CM

## 2017-06-01 DIAGNOSIS — G8929 Other chronic pain: Secondary | ICD-10-CM

## 2017-06-01 DIAGNOSIS — IMO0001 Reserved for inherently not codable concepts without codable children: Secondary | ICD-10-CM

## 2017-06-01 MED ORDER — AMLODIPINE BESYLATE 10 MG PO TABS: 10.0000 mg | ORAL_TABLET | Freq: Every day | ORAL | 1 refills | Status: DC

## 2017-06-01 MED ORDER — HYDROCODONE-ACETAMINOPHEN 7.5-325 MG PO TABS: ORAL_TABLET | ORAL | 0 refills | Status: AC

## 2017-06-01 MED ORDER — SITAGLIPTIN PHOSPHATE 100 MG PO TABS: 100.0000 mg | ORAL_TABLET | Freq: Every day | ORAL | 1 refills | Status: DC

## 2017-06-01 MED ORDER — TAMSULOSIN HCL 0.4 MG PO CAPS: 0.4000 mg | ORAL_CAPSULE | Freq: Every day | ORAL | 1 refills | Status: DC

## 2017-06-01 MED ORDER — GABAPENTIN 300 MG PO CAPS
ORAL_CAPSULE | ORAL | 1 refills | Status: DC
Start: 2017-06-01 — End: 2017-08-21

## 2017-06-01 MED ORDER — TEMAZEPAM 15 MG PO CAPS: 15.0000 mg | ORAL_CAPSULE | Freq: Every evening | ORAL | 1 refills | Status: DC | PRN

## 2017-06-01 MED ORDER — PRAVASTATIN SODIUM 20 MG PO TABS: 20.0000 mg | ORAL_TABLET | Freq: Every morning | ORAL | 1 refills | Status: DC

## 2017-06-01 NOTE — Progress Notes (Signed)
Fill Date ID Written Drug Qty Days Prescriber Rx # Pharmacy Refill Daily Dose * Pymt Type PMP  05/09/2017  1  01/31/2017  Hydrocodone-Acetamin 7.5-325  60 30 Ma Sim  [id number]  Car ([id number])  0 15.00 MME Comm Ins  [place]  04/10/2017  1  04/10/2017  Temazepam 15 Mg Capsule  90 90 Ma Sim  [id number]  Car ([id number])  0 0.75 LME Comm Ins  [place]  04/07/2017  1  01/31/2017  Hydrocodone-Acetamin 7.5-325  60 30 Ma Sim  [id number]  Car ([id number])  0 15.00 MME Comm Ins  [place]  03/06/2017  1  01/31/2017  Hydrocodone-Acetamin 7.5-325  60 30 Ma Sim  [id number]  Car ([id number])  0 15.00 MME Comm Ins  [place]  02/10/2017  1  11/17/2016  Hydrocodone-Acetamin 7.5-325  60 30 Ma Sim  [id number]  Car ([id number])  0 15.00 MME Comm Ins  [place]  01/13/2017  1  11/17/2016  Hydrocodone-Acetamin 7.5-325  60 30 Ma Sim  [id number]  Car ([id number])  0 15.00 MME Comm Ins  [place]  01/09/2017  1  01/09/2017  Temazepam 15 Mg Capsule  90 90 Ma Sim  [id number]  Car ([id number])  0 0.75 LME Comm Ins  [place]  12/14/2016  1  11/17/2016  Hydrocodone-Acetamin 7.5-325  60 30 Ma Sim  [id number]  Car ([id number])  0 15.00 MME Comm Ins  [place]  11/11/2016  1  08/30/2016  Hydrocodone-Acetamin 7.5-325  60 30 Ma Sim  [id number]  Car ([id number])  0 15.00 MME Comm Ins  [place]  10/11/2016  1  08/30/2016  Hydrocodone-Acetamin 7.5-325  60 30 Ma Sim  [id number]  Car ([id number])  0 15.00 MME Comm Ins  [place]  10/05/2016  1  10/04/2016  Temazepam 15 Mg Capsule  90 90 Ma Sim  [id number]  Car ([id number])  0 0.75 LME Comm Ins    09/08/2016  1  08/30/2016  Hydrocodone-Acetamin 7.5-325

## 2017-06-01 NOTE — Patient Instructions (Signed)
F/u week of July 8 call if you need me before  We will send for eye exam in [place] done in march with Dr Gershon Crane  Aim for 15 cigarettes per day by next visit  Fasting lipid, cmp and EGFr and hBa1C 1 week before next visit.  Medications are sent to your pharmacy  Stay safe and be happy

## 2017-06-10 ENCOUNTER — Encounter: Payer: Self-pay | Admitting: Family Medicine

## 2017-06-10 NOTE — Assessment & Plan Note (Signed)
The patient's Controlled Substance registry is reviewed and compliance confirmed. Adequacy of  Pain control and level of function is assessed. Medication dosing is adjusted as deemed appropriate. Twelve weeks of medication is prescribed , patient signs for the script and is provided with a follow up appointment between 11 to 12 weeks .  

## 2017-06-10 NOTE — Progress Notes (Signed)
Donald Everett     MRN: [id number]      DOB: Sep 07, 1940   HPI Donald Everett is here for follow up and re-evaluation of chronic medical conditions, medication management, in particular chronic pain managemnt, and review of any available recent lab and radiology data.  Preventive health is updated, specifically  Cancer screening and Immunization.   Questions or concerns regarding consultations or procedures which the PT has had in the interim are  addressed. The PT denies any adverse reactions to current medications since the last visit.  There are no new concerns.  There are no specific complaints   ROS Denies recent fever or chills. Denies sinus pressure, nasal congestion, ear pain or sore throat. Denies chest congestion, productive cough or wheezing. Denies chest pains, palpitations and leg swelling Denies abdominal pain, nausea, vomiting,diarrhea or constipation.   Denies dysuria, frequency, hesitancy or incontinence. Denies uncontrolled  joint pain, swelling and limitation in mobility. Denies headaches, seizures,has chronic  numbness, and  tingling. Denies depression, anxiety or insomnia.He is on medication for anxiety and sleep Denies skin break down or rash.   PE  BP 140/62   Pulse 75   Resp 16   Ht  (1.753 m)   Wt 140 lb (63.5 kg)   SpO2 97%   BMI 20.67 kg/m   Patient alert and oriented and in no cardiopulmonary distress.Has severe hearing loss HEENT: No facial asymmetry, EOMI,   oropharynx pink and moist.  Neck supple no JVD, no mass.  Chest: Clear to auscultation bilaterally.Decreased air entry   CVS: S1, S2 no murmurs, no S3.Regular rate.  ABD: Soft non tender.   Ext: No edema  MS: Adequate though reduced  ROM spine, shoulders, hips and knees.  Skin: Intact, no ulcerations or rash noted.  Psych: Good eye contact, normal affect. Memory intact not anxious or depressed appearing.  CNS: CN 2-12 intact, power,  normal throughout.no focal deficits  noted.   Assessment & Plan  Encounter for chronic pain management The patient's Controlled Substance registry is reviewed and compliance confirmed. Adequacy of  Pain control and level of function is assessed. Medication dosing is adjusted as deemed appropriate. Twelve weeks of medication is prescribed , patient signs for the script and is provided with a follow up appointment between 11 to 12 weeks .   NICOTINE ADDICTION Asked: confirms currently smokes 15/day Assess: not willing to set quit date but cutting back, counseled for 5 minutes and  Assist: 1800 QUIT NOW # provided,counseled for 5 minutes , and material provided  Advise: need to quit due to existing COPD, heart disease  Arrange: follow up in 12 weeks, plan to reduce by 1 cigarette per week  Diabetes mellitus, insulin dependent (IDDM), controlled (HCC) Controlled, no change in medication Donald Everett is reminded of the importance of commitment to daily physical activity for 30 minutes or more, as able and the need to limit carbohydrate intake to 30 to 60 grams per meal to help with blood sugar control.   The need to take medication as prescribed, test blood sugar as directed, and to call between visits if there is a concern that blood sugar is uncontrolled is also discussed.   Donald Everett is reminded of the importance of daily foot exam, annual eye examination, and good blood sugar, blood pressure and cholesterol control.  Diabetic Labs Latest Ref Rng & Units 03/23/2017 11/17/2016 9/28/201COURTEZ TWADDLE/07/2016  HbA1c <5.7 % of total Hgb 7.5(H) - 7.3(H) - 6.9(H)  Microalbumin  mg/dL - - 0.7 - -  Micro/Creat Ratio <30 mcg/mg creat - - 22 - -  Chol <200 mg/dL - - 161 - -  HDL >09 mg/dL - - 60(A) - -  Calc LDL mg/dL (calc) - - 58 - -  Triglycerides <150 mg/dL - - 540 - -  Creatinine 0.70 - 1.18 mg/dL 9.81(X) 9.14(N) 8.29(F) 1.51(H) 1.60(H)   BP/Weight 06/01/2017 01/31/2017 11/17/2016 10/19/2016 08/30/2016 07/25/2016 05/19/2016  Systolic BP  140 621 120 132 128 158 148  Diastolic BP 62 70 60 78 80 70 68  Wt. (Lbs) 140 145 145 146.04 141 144 142  BMI 20.67 21.41 21.41 21.57 20.82 21.27 20.97   Foot/eye exam completion dates 06/01/2017 03/11/2016  Foot Form Completion Done Done        Renovascular hypertension Not at goal, no medication change DASH diet and commitment to daily physical activity for a minimum of 30 minutes discussed and encouraged, as a part of hypertension management. The importance of attaining a healthy weight is also discussed.  BP/Weight 06/01/2017 01/31/2017 11/17/2016 10/19/2016 08/30/2016 07/25/2016 05/19/2016  Systolic BP 140 138 120 132 128 158 148  Diastolic BP 62 70 60 78 80 70 68  Wt. (Lbs) 140 145 145 146.04 141 144 142  BMI 20.67 21.41 21.41 21.57 20.82 21.27 20.97       Insomnia secondary to anxiety Sleep hygiene reviewed and written information offered also. Prescription sent for  medication needed.

## 2017-06-10 NOTE — Assessment & Plan Note (Signed)
Not at goal, no medication change DASH diet and commitment to daily physical activity for a minimum of 30 minutes discussed and encouraged, as a part of hypertension management. The importance of attaining a healthy weight is also discussed.  BP/Weight 06/01/2017 01/31/2017 11/17/2016 10/19/2016 08/30/2016 07/25/2016 05/19/2016  Systolic BP 140 138 120 132 128 158 148  Diastolic BP 62 70 60 78 80 70 68  Wt. (Lbs) 140 145 145 146.04 141 144 142  BMI 20.67 21.41 21.41 21.57 20.82 21.27 20.97

## 2017-06-10 NOTE — Assessment & Plan Note (Signed)
Sleep hygiene reviewed and written information offered also. Prescription sent for  medication needed.  

## 2017-06-10 NOTE — Assessment & Plan Note (Signed)
Asked: confirms currently smokes 15/day Assess: not willing to set quit date but cutting back, counseled for 5 minutes and  Assist: 1800 QUIT NOW # provided,counseled for 5 minutes , and material provided  Advise: need to quit due to existing COPD, heart disease  Arrange: follow up in 12 weeks, plan to reduce by 1 cigarette per week

## 2017-06-10 NOTE — Assessment & Plan Note (Signed)
Controlled, no change in medication Mr. Sloop is reminded of the importance of commitment to daily physical activity for 30 minutes or more, as able and the need to limit carbohydrate intake to 30 to 60 grams per meal to help with blood sugar control.   The need to take medication as prescribed, test blood sugar as directed, and to call between visits if there is a concern that blood sugar is uncontrolled is also discussed.   Mr. Wilsey is reminded of the importance of daily foot exam, annual eye examination, and good blood sugar, blood pressure and cholesterol control.  Diabetic Labs Latest Ref Rng & Units 03/23/2017 11/17/2016 11/11/2016 07/25/2016 07/20/2016  HbA1c <5.7 % of total Hgb 7.5(H) - 7.3(H) - 6.9(H)  Microalbumin mg/dL - - 0.7 - -  Micro/Creat Ratio <30 mcg/mg creat - - 22 - -  Chol <200 mg/dL - - 161 - -  HDL >09 mg/dL - - 60(A) - -  Calc LDL mg/dL (calc) - - 58 - -  Triglycerides <150 mg/dL - - 540 - -  Creatinine 0.70 - 1.18 mg/dL 9.81(X) 9.14(N) 8.29(F) 1.51(H) 1.60(H)   BP/Weight 06/01/2017 01/31/2017 11/17/2016 10/19/2016 08/30/2016 07/25/2016 05/19/2016  Systolic BP 140 138 120 132 128 158 148  Diastolic BP 62 70 60 78 80 70 68  Wt. (Lbs) 140 145 145 146.04 141 144 142  BMI 20.67 21.41 21.41 21.57 20.82 21.27 20.97   Foot/eye exam completion dates 06/01/2017 03/11/2016  Foot Form Completion Done Done

## 2017-06-14 ENCOUNTER — Ambulatory Visit: Payer: PPO | Admitting: Family Medicine

## 2017-06-19 ENCOUNTER — Telehealth: Payer: Self-pay

## 2017-06-19 NOTE — Telephone Encounter (Signed)
Wants to know why he is getting a bill for his b12. He said they had been paying for it and wants to know what happened. Please advise

## 2017-06-20 ENCOUNTER — Telehealth: Payer: Self-pay | Admitting: Family Medicine

## 2017-06-20 NOTE — Telephone Encounter (Signed)
Pt is calling to advise that  The B-12 was denied from the insurance, and that is the reason why he has not been in. Is there something we can do>?

## 2017-06-21 ENCOUNTER — Telehealth: Payer: Self-pay | Admitting: Family Medicine

## 2017-06-21 NOTE — Telephone Encounter (Signed)
Mr Turnbo called with a billing question regarding his B-12 begin denied from the insurance carrier.  I emailed Felix Ahmadi at [facility] billing and asked him to file an appeal and follow up on this.

## 2017-06-22 ENCOUNTER — Telehealth: Payer: Self-pay | Admitting: Family Medicine

## 2017-06-22 NOTE — Telephone Encounter (Signed)
I had contacted Donald Everett in the billing department and he has checked on this and found that the B12 shot is paid except for .35.  I called the patient and told him what Ethelene Browns found and told him not to worry about this unless her received a bill from [facility] and if he does to call the phone # on the bill and they will follow up on this.   email from Ethelene Browns : Shirlee Limerick -  It looks like the patient is responsible for .35 as his co-insurance.   Thanks,   Ethelene Browns

## 2017-06-22 NOTE — Telephone Encounter (Signed)
noted 

## 2017-06-23 NOTE — Telephone Encounter (Signed)
Shirlee Limerick has taken care of this for the patient

## 2017-07-04 ENCOUNTER — Ambulatory Visit (INDEPENDENT_AMBULATORY_CARE_PROVIDER_SITE_OTHER): Payer: PPO

## 2017-07-04 DIAGNOSIS — D510 Vitamin B12 deficiency anemia due to intrinsic factor deficiency: Secondary | ICD-10-CM | POA: Diagnosis not present

## 2017-07-04 MED ORDER — CYANOCOBALAMIN 1000 MCG/ML IJ SOLN
1000.0000 ug | Freq: Once | INTRAMUSCULAR | Status: AC
  Administered 2017-07-04: 1000 ug via INTRAMUSCULAR

## 2017-07-04 NOTE — Progress Notes (Signed)
Injection given with no complications in right deltoid

## 2017-07-04 NOTE — Telephone Encounter (Signed)
I contacted the billing department and they said it looks like it was paid and he only owes a few cents.  37 cents is what was left on the account for the last B12.

## 2017-07-07 ENCOUNTER — Other Ambulatory Visit: Payer: Self-pay | Admitting: Family Medicine

## 2017-07-17 ENCOUNTER — Other Ambulatory Visit: Payer: Self-pay | Admitting: Family Medicine

## 2017-07-19 ENCOUNTER — Telehealth: Payer: Self-pay | Admitting: Family Medicine

## 2017-07-19 NOTE — Telephone Encounter (Signed)
Patient called in to ask about the status of his request for test strips, he says the pharmacy [facility] According to patient chart, pharmacy confirmed request yesterday 07/18/17 at 1:05PM.

## 2017-07-19 NOTE — Telephone Encounter (Signed)
Patient left vm stating she was returning my call, I called her back to let her know we have already discussed what I was calling for. Her phone messed up mid conversation. I can not get through on the line. Patient called back & I discussed previous information. She asked if I had called Dr.Doonquah's office to make sure they sent her records, however, the patient is responsible for obtaining her records from different [facility] For a second opinion.

## 2017-07-31 ENCOUNTER — Ambulatory Visit (INDEPENDENT_AMBULATORY_CARE_PROVIDER_SITE_OTHER): Payer: PPO

## 2017-07-31 DIAGNOSIS — E538 Deficiency of other specified B group vitamins: Secondary | ICD-10-CM

## 2017-07-31 MED ORDER — CYANOCOBALAMIN 1000 MCG/ML IJ SOLN
1000.0000 ug | Freq: Once | INTRAMUSCULAR | Status: AC
  Administered 2017-07-31: 1000 ug via INTRAMUSCULAR

## 2017-07-31 NOTE — Progress Notes (Signed)
Monthly b12 given in left deltoid with no complications. Return in 1 month

## 2017-08-10 ENCOUNTER — Other Ambulatory Visit: Payer: Self-pay | Admitting: Family Medicine

## 2017-08-15 DIAGNOSIS — E119 Type 2 diabetes mellitus without complications: Secondary | ICD-10-CM | POA: Diagnosis not present

## 2017-08-15 DIAGNOSIS — Z794 Long term (current) use of insulin: Secondary | ICD-10-CM | POA: Diagnosis not present

## 2017-08-15 DIAGNOSIS — E785 Hyperlipidemia, unspecified: Secondary | ICD-10-CM | POA: Diagnosis not present

## 2017-08-16 LAB — COMPLETE METABOLIC PANEL WITH GFR
AG Ratio: 1.3 (calc) (ref 1.0–2.5)
ALBUMIN MSPROF: 3.4 g/dL — AB (ref 3.6–5.1)
ALKALINE PHOSPHATASE (APISO): 76 U/L (ref 40–115)
ALT: 11 U/L (ref 9–46)
AST: 18 U/L (ref 10–35)
BUN / CREAT RATIO: 18 (calc) (ref 6–22)
BUN: 26 mg/dL — AB (ref 7–25)
CALCIUM: 9 mg/dL (ref 8.6–10.3)
CO2: 27 mmol/L (ref 20–32)
CREATININE: 1.48 mg/dL — AB (ref 0.70–1.18)
Chloride: 107 mmol/L (ref 98–110)
GFR, EST NON AFRICAN AMERICAN: 45 mL/min/{1.73_m2} — AB (ref >=60)
GFR, Est African American: 53 mL/min/{1.73_m2} — ABNORMAL LOW (ref >=60)
GLUCOSE: 122 mg/dL — AB (ref 65–99)
Globulin: 2.7 g/dL (calc) (ref 1.9–3.7)
Potassium: 5.2 mmol/L (ref 3.5–5.3)
Sodium: 140 mmol/L (ref 135–146)
Total Bilirubin: 0.3 mg/dL (ref 0.2–1.2)
Total Protein: 6.1 g/dL (ref 6.1–8.1)

## 2017-08-16 LAB — HEMOGLOBIN A1C
EAG (MMOL/L): 8.4 (calc)
HEMOGLOBIN A1C: 6.9 %{Hb} — AB (ref <5.7)
MEAN PLASMA GLUCOSE: 151 (calc)

## 2017-08-16 LAB — LIPID PANEL
Cholesterol: 103 mg/dL (ref <200)
HDL: 29 mg/dL — ABNORMAL LOW (ref >40)
LDL CHOLESTEROL (CALC): 57 mg/dL
Non-HDL Cholesterol (Calc): 74 mg/dL (calc) (ref <130)
Total CHOL/HDL Ratio: 3.6 (calc) (ref <5.0)
Triglycerides: 85 mg/dL (ref <150)

## 2017-08-21 ENCOUNTER — Ambulatory Visit (INDEPENDENT_AMBULATORY_CARE_PROVIDER_SITE_OTHER): Payer: PPO | Admitting: Family Medicine

## 2017-08-21 ENCOUNTER — Other Ambulatory Visit: Payer: Self-pay | Admitting: Family Medicine

## 2017-08-21 ENCOUNTER — Encounter: Payer: Self-pay | Admitting: Family Medicine

## 2017-08-21 VITALS — BP 140/60 | HR 71 | Resp 16 | Ht 69.0 in | Wt 144.0 lb

## 2017-08-21 DIAGNOSIS — F17200 Nicotine dependence, unspecified, uncomplicated: Secondary | ICD-10-CM | POA: Diagnosis not present

## 2017-08-21 DIAGNOSIS — F419 Anxiety disorder, unspecified: Secondary | ICD-10-CM

## 2017-08-21 DIAGNOSIS — IMO0001 Reserved for inherently not codable concepts without codable children: Secondary | ICD-10-CM

## 2017-08-21 DIAGNOSIS — F5105 Insomnia due to other mental disorder: Secondary | ICD-10-CM

## 2017-08-21 DIAGNOSIS — Z794 Long term (current) use of insulin: Secondary | ICD-10-CM

## 2017-08-21 DIAGNOSIS — I150 Renovascular hypertension: Secondary | ICD-10-CM | POA: Diagnosis not present

## 2017-08-21 DIAGNOSIS — E119 Type 2 diabetes mellitus without complications: Secondary | ICD-10-CM | POA: Diagnosis not present

## 2017-08-21 DIAGNOSIS — G8929 Other chronic pain: Secondary | ICD-10-CM | POA: Diagnosis not present

## 2017-08-21 MED ORDER — GABAPENTIN 300 MG PO CAPS: ORAL_CAPSULE | ORAL | 1 refills | Status: DC

## 2017-08-21 MED ORDER — PRAVASTATIN SODIUM 20 MG PO TABS: 20.0000 mg | ORAL_TABLET | Freq: Every morning | ORAL | 1 refills | Status: DC

## 2017-08-21 MED ORDER — AMLODIPINE BESYLATE 10 MG PO TABS: 10.0000 mg | ORAL_TABLET | Freq: Every day | ORAL | 1 refills | Status: DC

## 2017-08-21 MED ORDER — LOSARTAN POTASSIUM 100 MG PO TABS: 100.0000 mg | ORAL_TABLET | Freq: Every day | ORAL | 1 refills | Status: DC

## 2017-08-21 NOTE — Patient Instructions (Addendum)
F/U mid November, call if you need me before\  Non fast PSA, HBa1C chem 7 and EGFR, microalb , TSH and vit D 1 week brefore November visit  Increase water to 36 ounces daily    Wellness with Nurse in [place], and flu vaccine at that visit asnd also the B12 if this is 4 weeks after the prior month    Congrats on EXCELLENT labs  Please work dwon to [employer] cigarettes/ day, great you are slowing down  B 12 today if last was 3 weeks ago will check wth nurse

## 2017-08-21 NOTE — Progress Notes (Signed)
Fill Date ID Written Drug Qty Days Prescriber Rx # Pharmacy Refill Daily Dose * Pymt Type PMP  08/10/2017  1  06/01/2017  Hydrocodone-Acetamin 7.5-325  60 30 Ma Sim  [id number]  Car ([id number])  0 15.00 MME Comm Ins  [place]  07/10/2017  1  06/01/2017  Hydrocodone-Acetamin 7.5-325  60 30 Ma Sim  [id number]  Car ([id number])  0 15.00 MME Comm Ins  [place]  07/07/2017  1  06/01/2017  Temazepam 15 Mg Capsule  90 90 Ma Sim  [id number]  Car ([id number])  0 0.75 LME Comm Ins  [place]  06/12/2017  1  06/01/2017  Hydrocodone-Acetamin 7.5-325  60 30 Ma Sim  [id number]  Car ([id number])  0 15.00 MME Comm Ins  [place]  05/09/2017  1  01/31/2017  Hydrocodone-Acetamin 7.5-325  60 30 Ma Sim  [id number]  Car ([id number])  0 15.00 MME Comm Ins  [place]  04/10/2017  1  04/10/2017  Temazepam 15 Mg Capsule  90 90 Ma Sim  [id number]  Car ([id number])  0 0.75 LME Comm Ins  [place]  04/07/2017  1  01/31/2017  Hydrocodone-Acetamin 7.5-325  60 30 Ma Sim  [id number]  Car ([id number])  0 15.00 MME Comm Ins  [place]  03/06/2017  1  01/31/2017  Hydrocodone-Acetamin 7.5-325  60 30 Ma Sim  [id number]  Car ([id number])  0 15.00 MME Comm Ins  [place]  02/10/2017  1  11/17/2016  Hydrocodone-Acetamin 7.5-325  60 30 Ma Sim  [id number]  Car ([id number])  0 15.00 MME Comm Ins  [place]  01/13/2017  1  11/17/2016  Hydrocodone-Acetamin 7.5-325  60 30 Ma Sim  [id number]  Car ([id number])  0 15.00 MME Comm Ins  [place]  01/09/2017  1  01/09/2017  Temazepam 15 Mg Capsule  90 90 Ma Sim  [id number]  Car ([id number])  0 0.75 LME Comm Ins  [place]  12/14/2016  1  11/17/2016  Hydrocodone-Acetamin 7.5-325  60 30 Ma Sim  [id number]  Car ([id number])  0 15.00 MME Comm Ins    11/11/2016  1  08/30/2016  Hydrocodone-Acetamin 7.5-325

## 2017-08-28 ENCOUNTER — Encounter: Payer: Self-pay | Admitting: Family Medicine

## 2017-08-28 MED ORDER — HYDROCODONE-ACETAMINOPHEN 7.5-325 MG PO TABS
ORAL_TABLET | ORAL | 0 refills | Status: AC
Start: 2017-11-07 — End: 2017-12-07

## 2017-08-28 MED ORDER — HYDROCODONE-ACETAMINOPHEN 7.5-325 MG PO TABS: ORAL_TABLET | ORAL | 0 refills | Status: AC

## 2017-08-28 MED ORDER — HYDROCODONE-ACETAMINOPHEN 7.5-325 MG PO TABS
ORAL_TABLET | ORAL | 0 refills | Status: AC
Start: 2017-10-08 — End: 2017-11-07

## 2017-08-28 NOTE — Assessment & Plan Note (Signed)
The patient's Controlled Substance registry is reviewed and compliance confirmed. Adequacy of  Pain control and level of function is assessed. Medication dosing is adjusted as deemed appropriate. Twelve weeks of medication is prescribed , patient signs for the script and is provided with a follow up appointment between 11 to 12 weeks .  

## 2017-08-28 NOTE — Assessment & Plan Note (Signed)
Adequately addressed with current medication continue same Sleep hygiene reviewed and written information offered also. Prescription sent for  medication needed.

## 2017-08-28 NOTE — Assessment & Plan Note (Signed)
Asked: confirms currently smokes 18 cigarettes daily Assess: not willing to set quit date, aim to reduce to 15/day Advise: needs to quit to reduce risk of CAD, stroke and cancer Assist: counseled for 5 mins, literature and 1800 QUIT NOW info given, no interest in smoking cessation aids  Currently though offered Arrange follow up in 3 months

## 2017-08-28 NOTE — Assessment & Plan Note (Signed)
Controlled, no change in medication DASH diet and commitment to daily physical activity for a minimum of 30 minutes discussed and encouraged, as a part of hypertension management. The importance of attaining a healthy weight is also discussed.  BP/Weight 08/21/2017 06/01/2017 01/31/2017 11/17/2016 10/19/2016 08/30/2016 07/25/2016  Systolic BP 140 140 138 120 132 128 158  Diastolic BP 60 62 70 60 78 80 70  Wt. (Lbs) 144 140 145 145 146.04 141 144  BMI 21.27 20.67 21.41 21.41 21.57 20.82 21.27

## 2017-08-28 NOTE — Progress Notes (Signed)
Donald Everett     MRN: [id number]      DOB: [birth year]   HPI Donald Everett is here for follow up and re-evaluation of chronic medical conditions, medication management and review of any available recent lab and radiology data.  Preventive health is updated, specifically  Cancer screening and Immunization.   Questions or concerns regarding consultations or procedures which the PT has had in the interim are  addressed. The PT denies any adverse reactions to current medications since the last visit.  There are no new concerns.  There are no specific complaints  Denies polyuria, polydipsia, blurred vision , or hypoglycemic episodes.   ROS Denies recent fever or chills. Denies sinus pressure, nasal congestion, ear pain or sore throat. Denies chest congestion, productive cough or wheezing. Denies chest pains, palpitations and leg swelling Denies abdominal pain, nausea, vomiting,diarrhea or constipation.   Denies dysuria, frequency, hesitancy or incontinence. Denies uncontrolled  joint pain, swelling and limitation in mobility. Denies headaches, seizures, numbness, or tingling. Denies depression, anxiety or insomnia. Denies skin break down or rash.   PE  BP 140/60   Pulse 71   Resp 16   Ht 5\' 9"  (1.753 m)   Wt 144 lb (65.3 kg)   SpO2 97%   BMI 21.27 kg/m   Patient alert and oriented and in no cardiopulmonary distress.  HEENT: No facial asymmetry, EOMI,   oropharynx pink and moist.  Neck supple no JVD, no mass.  Chest: Clear to auscultation bilaterally.  CVS: S1, S2 no murmurs, no S3.Regular rate.  ABD: Soft non tender.   Ext: No edema  MS: decreased  ROM spine, shoulders, hips and knees.  Skin: Intact, no ulcerations or rash noted.  Psych: Good eye contact, normal affect. Memory intact not anxious or depressed appearing.  CNS: CN 2-12 intact, power,  normal throughout.no focal deficits noted.   Assessment & Plan  Diabetes mellitus, insulin dependent (IDDM),  controlled (HCC) Controlled, no change in medication Donald Everett is reminded of the importance of commitment to daily physical activity for 30 minutes or more, as able and the need to limit carbohydrate intake to 30 to 60 grams per meal to help with blood sugar control.   The need to take medication as prescribed, test blood sugar as directed, and to call between visits if there is a concern that blood sugar is uncontrolled is also discussed.   Donald Everett is reminded of the importance of daily foot exam, annual eye examination, and good blood sugar, blood pressure and cholesterol control.  Diabetic Labs Latest Ref Rng & Units 08/15/2017 03/23/2017 11/17/2016 11/11/2016 07/25/2016  HbA1c <5.7 % of total Hgb 6.9(H) 7.5(H) - 7.3(H) -  Microalbumin mg/dL - - - 0.7 -  Micro/Creat Ratio <30 mcg/mg creat - - - 22 -  Chol <200 mg/dL 409 - - 811 -  HDL >91 mg/dL 47(W) - - 29(F) -  Calc LDL mg/dL (calc) 57 - - 58 -  Triglycerides <150 mg/dL 85 - - 621 -  Creatinine 0.70 - 1.18 mg/dL 3.08(M) 5.78(I) 6.96(E) 1.52(H) 1.51(H)   BP/Weight 08/21/2017 06/01/2017 01/31/2017 11/17/2016 10/19/2016 08/30/2016 07/25/2016  Systolic BP 140 140 138 120 132 128 158  Diastolic BP 60 62 70 60 78 80 70  Wt. (Lbs) 144 140 145 145 146.04 141 144  BMI 21.27 20.67 21.41 21.41 21.57 20.82 21.27   Foot/eye exam completion dates Latest Ref Rng & Units 06/01/2017 04/18/2017  Eye Exam No Retinopathy - Retinopathy(A)  Foot  Form Completion - Done -         Renovascular hypertension Controlled, no change in medication DASH diet and commitment to daily physical activity for a minimum of 30 minutes discussed and encouraged, as a part of hypertension management. The importance of attaining a healthy weight is also discussed.  BP/Weight 08/21/2017 06/01/2017 01/31/2017 11/17/2016 10/19/2016 08/30/2016 07/25/2016  Systolic BP 140 140 138 120 132 128 158  Diastolic BP 60 62 70 60 78 80 70  Wt. (Lbs) 144 140 145 145 146.04 141 144  BMI 21.27 20.67  21.41 21.41 21.57 20.82 21.27       Encounter for chronic pain management The patient's Controlled Substance registry is reviewed and compliance confirmed. Adequacy of  Pain control and level of function is assessed. Medication dosing is adjusted as deemed appropriate. Twelve weeks of medication is prescribed , patient signs for the script and is provided with a follow up appointment between 11 to 12 weeks .   NICOTINE ADDICTION Asked: confirms currently smokes 18 cigarettes daily Assess: not willing to set quit date, aim to reduce to 15/day Advise: needs to quit to reduce risk of CAD, stroke and cancer Assist: counseled for 5 mins, literature and 1800 QUIT NOW info given, no interest in smoking cessation aids  Currently though offered Arrange follow up in 3 months  Insomnia secondary to anxiety Adequately addressed with current medication continue same Sleep hygiene reviewed and written information offered also. Prescription sent for  medication needed.

## 2017-08-28 NOTE — Assessment & Plan Note (Signed)
Controlled, no change in medication [name] is reminded of the importance of commitment to daily physical activity for 30 minutes or more, as able and the need to limit carbohydrate intake to 30 to 60 grams per meal to help with blood sugar control.   The need to take medication as prescribed, test blood sugar as directed, and to call between visits if there is a concern that blood sugar is uncontrolled is also discussed.   [name] is reminded of the importance of daily foot exam, annual eye examination, and good blood sugar, blood pressure and cholesterol control.  Diabetic Labs Latest Ref Rng & Units 08/15/2017 03/23/2017 11/17/2016 11/11/2016 07/25/2016  HbA1c <5.7 % of total Hgb 6.9(H) 7.5(H) - 7.3(H) -  Microalbumin mg/dL - - - 0.7 -  Micro/Creat Ratio <30 mcg/mg creat - - - 22 -  Chol <200 mg/dL 161103 - - 096101 -  HDL >04>40 mg/dL 54(U29(L) - - 98(J24(L) -  Calc LDL mg/dL (calc) 57 - - 58 -  Triglycerides <150 mg/dL 85 - - 191106 -  Creatinine 0.70 - 1.18 mg/dL 4.78(G1.48(H) 9.56(O1.32(H) 1.30(Q1.48(H) 1.52(H) 1.51(H)   BP/Weight 08/21/2017 06/01/2017 01/31/2017 11/17/2016 10/19/2016 08/30/2016 07/25/2016  Systolic BP 140 140 138 120 132 128 158  Diastolic BP 60 62 70 60 78 80 70  Wt. (Lbs) 144 140 145 145 146.04 141 144  BMI 21.27 20.67 21.41 21.41 21.57 20.82 21.27   Foot/eye exam completion dates Latest Ref Rng & Units 06/01/2017 04/18/2017  Eye Exam No Retinopathy - Retinopathy(A)  Foot Form Completion - Done -

## 2017-08-29 ENCOUNTER — Ambulatory Visit (INDEPENDENT_AMBULATORY_CARE_PROVIDER_SITE_OTHER): Payer: PPO | Admitting: Family Medicine

## 2017-08-29 DIAGNOSIS — E538 Deficiency of other specified B group vitamins: Secondary | ICD-10-CM | POA: Diagnosis not present

## 2017-08-29 MED ORDER — CYANOCOBALAMIN 1000 MCG/ML IJ SOLN
1000.0000 ug | Freq: Once | INTRAMUSCULAR | Status: AC
  Administered 2017-08-29: 1000 ug via INTRAMUSCULAR

## 2017-08-30 NOTE — Progress Notes (Signed)
Vit B12 1 cc IM administered per routine every 4 weeks for patient with B 12 deficiency  No local adverts  side effects noted

## 2017-09-03 ENCOUNTER — Encounter: Payer: Self-pay | Admitting: Family Medicine

## 2017-09-29 ENCOUNTER — Ambulatory Visit (INDEPENDENT_AMBULATORY_CARE_PROVIDER_SITE_OTHER): Payer: PPO

## 2017-09-29 DIAGNOSIS — D519 Vitamin B12 deficiency anemia, unspecified: Secondary | ICD-10-CM

## 2017-09-29 MED ORDER — CYANOCOBALAMIN 1000 MCG/ML IJ SOLN
1000.0000 ug | Freq: Once | INTRAMUSCULAR | Status: AC
  Administered 2017-09-29: 1000 ug via INTRAMUSCULAR

## 2017-09-29 NOTE — Progress Notes (Signed)
B12 given with no complication. Return in 1 month

## 2017-10-30 ENCOUNTER — Ambulatory Visit (INDEPENDENT_AMBULATORY_CARE_PROVIDER_SITE_OTHER): Payer: PPO

## 2017-10-30 VITALS — BP 140/64 | HR 68 | Resp 15 | Ht 69.0 in | Wt 142.0 lb

## 2017-10-30 VITALS — BP 140/64 | Wt 142.0 lb

## 2017-10-30 DIAGNOSIS — Z23 Encounter for immunization: Secondary | ICD-10-CM

## 2017-10-30 DIAGNOSIS — E538 Deficiency of other specified B group vitamins: Secondary | ICD-10-CM | POA: Diagnosis not present

## 2017-10-30 DIAGNOSIS — Z0000 Encounter for general adult medical examination without abnormal findings: Secondary | ICD-10-CM

## 2017-10-30 MED ORDER — CYANOCOBALAMIN 1000 MCG/ML IJ SOLN
1000.0000 ug | Freq: Once | INTRAMUSCULAR | Status: AC
  Administered 2017-10-30: 1000 ug via INTRAMUSCULAR

## 2017-10-30 NOTE — Patient Instructions (Signed)
Donald Everett , Thank you for taking time to come for your Medicare Wellness Visit. I appreciate your ongoing commitment to your health goals. Please review the following plan we discussed and let me know if I can assist you in the future.   Screening recommendations/referrals: Colonoscopy: up to date  Recommended yearly ophthalmology/optometry visit for glaucoma screening and checkup Recommended yearly dental visit for hygiene and checkup  Vaccinations: Influenza vaccine: given today  Pneumococcal vaccine: up to date  Tdap vaccine: up to date  Shingles vaccine: ask insurance if covered    Advanced directives: form given   Conditions/risks identified: done   Next appointment: scheduled   Preventive Care 65 Years and Older, Male Preventive care refers to lifestyle choices and visits with your health care provider that can promote health and wellness. What does preventive care include?  A yearly physical exam. This is also called an annual well check.  Dental exams once or twice a year.  Routine eye exams. Ask your health care provider how often you should have your eyes checked.  Personal lifestyle choices, including:  Daily care of your teeth and gums.  Regular physical activity.  Eating a healthy diet.  Avoiding tobacco and drug use.  Limiting alcohol use.  Practicing safe sex.  Taking low doses of aspirin every day.  Taking vitamin and mineral supplements as recommended by your health care provider. What happens during an annual well check? The services and screenings done by your health care provider during your annual well check will depend on your age, overall health, lifestyle risk factors, and family history of disease. Counseling  Your health care provider may ask you questions about your:  Alcohol use.  Tobacco use.  Drug use.  Emotional well-being.  Home and relationship well-being.  Sexual activity.  Eating habits.  History of falls.  Memory  and ability to understand (cognition).  Work and work Astron[employer]. Screening  You may have the following tests or measurements:  Height, weight, and BMI.  Blood pressure.  Lipid and cholesterol levels. These may be checked every 5 years, or more frequently if you are over 523 years old.  Skin check.  Lung cancer screening. You may have this screening every year starting at age 77 if you have a 30-pack-year history of smoking and currently smoke or have quit within the past 15 years.  Fecal occult blood test (FOBT) of the stool. You may have this test every year starting at age 77.  Flexible sigmoidoscopy or colonoscopy. You may have a sigmoidoscopy every 5 years or a colonoscopy every 10 years starting at age 77.  Prostate cancer screening. Recommendations will vary depending on your family history and other risks.  Hepatitis C blood test.  Hepatitis B blood test.  Sexually transmitted disease (STD) testing.  Diabetes screening. This is done by checking your blood sugar (glucose) after you have not eaten for a while (fasting). You may have this done every 1-3 years.  Abdominal aortic aneurysm (AAA) screening. You may need this if you are a current or former smoker.  Osteoporosis. You may be screened starting at age 77 if you are at high risk. Talk with your health care provider about your test results, treatment options, and if necessary, the need for more tests. Vaccines  Your health care provider may recommend certain vaccines, such as:  Influenza vaccine. This is recommended every year.  Tetanus, diphtheria, and acellular pertussis (Tdap, Td) vaccine. You may need a Td booster every 10 years.  Zoster vaccine. You may need this after age 81.  Pneumococcal 13-valent conjugate (PCV13) vaccine. One dose is recommended after age 87.  Pneumococcal polysaccharide (PPSV23) vaccine. One dose is recommended after age 21. Talk to your health care provider about which screenings  and vaccines you need and how often you need them. This information is not intended to replace advice given to you by your health care provider. Make sure you discuss any questions you have with your health care provider. Document Released: 02/27/2015 Document Revised: 10/21/2015 Document Reviewed: 12/02/2014 Elsevier Interactive Patient Education  2017 [place] Prevention in the Home Falls can cause injuries. They can happen to people of all ages. There are many things you can do to make your home safe and to help prevent falls. What can I do on the outside of my home?  Regularly fix the edges of walkways and driveways and fix any cracks.  Remove anything that might make you trip as you walk through a door, such as a raised step or threshold.  Trim any bushes or trees on the path to your home.  Use bright outdoor lighting.  Clear any walking paths of anything that might make someone trip, such as rocks or tools.  Regularly check to see if handrails are loose or broken. Make sure that both sides of any steps have handrails.  Any raised decks and porches should have guardrails on the edges.  Have any leaves, snow, or ice cleared regularly.  Use sand or salt on walking paths during winter.  Clean up any spills in your garage right away. This includes oil or grease spills. What can I do in the bathroom?  Use night lights.  Install grab bars by the toilet and in the tub and shower. Do not use towel bars as grab bars.  Use non-skid mats or decals in the tub or shower.  If you need to sit down in the shower, use a plastic, non-slip stool.  Keep the floor dry. Clean up any water that spills on the floor as soon as it happens.  Remove soap buildup in the tub or shower regularly.  Attach bath mats securely with double-sided non-slip rug tape.  Do not have throw rugs and other things on the floor that can make you trip. What can I do in the bedroom?  Use night  lights.  Make sure that you have a light by your bed that is easy to reach.  Do not use any sheets or blankets that are too big for your bed. They should not hang down onto the floor.  Have a firm chair that has side arms. You can use this for support while you get dressed.  Do not have throw rugs and other things on the floor that can make you trip. What can I do in the kitchen?  Clean up any spills right away.  Avoid walking on wet floors.  Keep items that you use a lot in easy-to-reach places.  If you need to reach something above you, use a strong step stool that has a grab bar.  Keep electrical cords out of the way.  Do not use floor polish or wax that makes floors slippery. If you must use wax, use non-skid floor wax.  Do not have throw rugs and other things on the floor that can make you trip. What can I do with my stairs?  Do not leave any items on the stairs.  Make sure that there are  handrails on both sides of the stairs and use them. Fix handrails that are broken or loose. Make sure that handrails are as long as the stairways.  Check any carpeting to make sure that it is firmly attached to the stairs. Fix any carpet that is loose or worn.  Avoid having throw rugs at the top or bottom of the stairs. If you do have throw rugs, attach them to the floor with carpet tape.  Make sure that you have a light switch at the top of the stairs and the bottom of the stairs. If you do not have them, ask someone to add them for you. What else can I do to help prevent falls?  Wear shoes that:  Do not have high heels.  Have rubber bottoms.  Are comfortable and fit you well.  Are closed at the toe. Do not wear sandals.  If you use a stepladder:  Make sure that it is fully opened. Do not climb a closed stepladder.  Make sure that both sides of the stepladder are locked into place.  Ask someone to hold it for you, if possible.  Clearly mark and make sure that you can  see:  Any grab bars or handrails.  First and last steps.  Where the edge of each step is.  Use tools that help you move around (mobility aids) if they are needed. These include:  Canes.  Walkers.  Scooters.  Crutches.  Turn on the lights when you go into a dark area. Replace any light bulbs as soon as they burn out.  Set up your furniture so you have a clear path. Avoid moving your furniture around.  If any of your floors are uneven, fix them.  If there are any pets around you, be aware of where they are.  Review your medicines with your doctor. Some medicines can make you feel dizzy. This can increase your chance of falling. Ask your doctor what other things that you can do to help prevent falls. This information is not intended to replace advice given to you by your health care provider. Make sure you discuss any questions you have with your health care provider. Document Released: 11/27/2008 Document Revised: 07/09/2015 Document Reviewed: 03/07/2014 Elsevier Interactive Patient Education  2017 [employer].

## 2017-10-30 NOTE — Progress Notes (Signed)
b12 injection given in left deltoid. Pt to return in 1 month for next injection

## 2017-10-30 NOTE — Progress Notes (Signed)
Subjective:   Donald Everett is a 77 y.o. male who presents for Medicare Annual/Subsequent preventive examination.  Review of Systems:   Cardiac Risk Factors include: advanced age (>8men, >40 women);diabetes mellitus;dyslipidemia;male gender;hypertension;sedentary lifestyle;smoking/ tobacco exposure     Objective:    Vitals: BP 140/64   Pulse 68   Resp 15   Ht 5\' 9"  (1.753 m)   Wt 142 lb (64.4 kg)   SpO2 96%   BMI 20.97 kg/m   Body mass index is 20.97 kg/m.  Advanced Directives 10/30/2017 10/19/2016 01/26/2016 08/28/2015 01/12/2015 01/12/2015 02/25/2013  Does Patient Have a Medical Advance Directive? No No No No No No Patient would not like information;Patient does not have advance directive  Would patient like information on creating a medical advance directive? Yes (ED - Information included in AVS) No - Patient declined No - Patient declined No - patient declined information Yes - Transport planner given Yes - Transport planner given -    Tobacco Social History   Tobacco Use  Smoking Status Current Every Day Smoker  . Packs/day: 1.00  . Years: 60.00  . Pack years: 60.00  . Types: Cigarettes  Smokeless Tobacco Former Neurosurgeon  . Types: Chew  . Quit date: 10/20/1983  Tobacco Comment   18     Ready to quit: Not Answered Counseling given: Not Answered Comment: 18   Clinical Intake:  Pre-visit preparation completed: Yes  Pain : 0-10 Pain Score: 8  Pain Type: Chronic pain Pain Location: Back     Nutritional Status: BMI of 19-24  Normal Diabetes: Yes CBG done?: No Did pt. bring in CBG monitor from home?: No  How often do you need to have someone help you when you read instructions, pamphlets, or other written materials from your doctor or pharmacy?: 2 - Rarely What is the last grade level you completed in school?: 8th grade   Interpreter Needed?: No  Information entered by :: Everitt Amber LPN   Past Medical History:  Diagnosis Date  . Allergy   .  Anxiety   . Arthritis    "joints" (02/25/2013)  . Chronic low back pain   . COPD (chronic obstructive pulmonary disease) (HCC)   . Depression   . ED (erectile dysfunction)   . GERD (gastroesophageal reflux disease)   . Hyperlipidemia   . Hypertension   . Insomnia   . Nicotine addiction   . Peripheral arterial disease (HCC)   . Renal artery stenosis (HCC)   . Type II diabetes mellitus (HCC)    Past Surgical History:  Procedure Laterality Date  . COLONOSCOPY    . COLONOSCOPY N/A 08/28/2015   Procedure: COLONOSCOPY;  Surgeon: Malissa Hippo, MD;  Location: [facility];  Service: Endoscopy;  Laterality: N/A;  10:15 - moved to 10:45 - Ann to notify pt  . FEMORAL ARTERY STENT Right 02/25/2013  . LOWER EXTREMITY ANGIOGRAM Bilateral 02/25/2013   Procedure: LOWER EXTREMITY ANGIOGRAM;  Surgeon: Runell Gess, MD;  Location: [facility] CATH LAB;  Service: Cardiovascular;  Laterality: Bilateral;   Family History  Problem Relation Age of Onset  . Diabetes Mother   . Diabetes Father   . Stroke Father   . Diabetes Sister   . CAD Brother        CABG   Social History   Socioeconomic History  . Marital status: Divorced    Spouse name: Not on file  . Number of children: Not on file  . Years of education: Not on file  .  Highest education level: Not on file  Occupational History  . Not on file  Social Needs  . Financial resource strain: Not hard at all  . Food insecurity:    Worry: Never true    Inability: Never true  . Transportation needs:    Medical: No    Non-medical: No  Tobacco Use  . Smoking status: Current Every Day Smoker    Packs/day: 1.00    Years: 60.00    Pack years: 60.00    Types: Cigarettes  . Smokeless tobacco: Former Neurosurgeon    Types: Chew    Quit date: 10/20/1983  . Tobacco comment: 18  Substance and Sexual Activity  . Alcohol use: No    Comment: Patient denies  . Drug use: No  . Sexual activity: Never  Lifestyle  . Physical activity:    Days per week: 0 days      Minutes per session: 0 min  . Stress: Only a little  Relationships  . Social connections:    Talks on phone: Twice a week    Gets together: Twice a week    Attends religious service: Never    Active member of club or organization: No    Attends meetings of clubs or organizations: Not on file    Relationship status: Divorced  Other Topics Concern  . Not on file  Social History Narrative  . Not on file    Outpatient Encounter Medications as of 10/30/2017  Medication Sig  . amLODipine (NORVASC) 10 MG tablet Take 1 tablet (10 mg total) by mouth daily.  Marland Kitchen aspirin EC 325 MG EC tablet Take 1 tablet (325 mg total) by mouth daily.  . B Complex-C (B-COMPLEX WITH VITAMIN C) tablet Take 1 tablet by mouth daily.  . fluticasone (FLONASE) 50 MCG/ACT nasal spray USE 1 SPRAY IN EACH NOSTRIL ONCE DAILY.  Marland Kitchen Fluticasone-Salmeterol (ADVAIR DISKUS) 100-50 MCG/DOSE AEPB Inhale 1 puff into the lungs 2 (two) times daily.  Marland Kitchen gabapentin (NEURONTIN) 300 MG capsule 1 capsules twice daily and 2 at bedtime  . HYDROcodone-acetaminophen (NORCO) 7.5-325 MG tablet Take 1 tablet by mouth 2 (two) times daily as needed.  Marland Kitchen HYDROcodone-acetaminophen (NORCO) 7.5-325 MG tablet Take 1 tablet by mouth 2 (two) times daily as needed.  Marland Kitchen HYDROcodone-acetaminophen (NORCO) 7.5-325 MG tablet Take one tablet by mouth two times daily for back pain  . [START ON 11/07/2017] HYDROcodone-acetaminophen (NORCO) 7.5-325 MG tablet Take one tablet two times daily for back pain  . insulin aspart protamine- aspart (NOVOLOG MIX 70/30) (70-30) 100 UNIT/ML injection Inject 0.1 mLs (10 Units total) into the skin 2 (two) times daily with a meal.  . LANTUS SOLOSTAR 100 UNIT/ML Solostar Pen INJECT 7 UNITS UNDER THE SKIN EVERY MORNING (MAX 15 UNITS).  Marland Kitchen losartan (COZAAR) 100 MG tablet Take 1 tablet (100 mg total) by mouth daily.  . multivitamin-lutein (OCUVITE-LUTEIN) CAPS capsule Take 1 capsule by mouth daily.  . ONE TOUCH ULTRA TEST test strip USE  TO TEST THREE TIMES DAILY.  Marland Kitchen ONETOUCH DELICA LANCETS 33G MISC USE TO TEST THREE TIMES DAILY.  . pantoprazole (PROTONIX) 40 MG tablet TAKE ONE TABLET BY MOUTH DAILY.  Marland Kitchen polyethylene glycol (MIRALAX / GLYCOLAX) packet Take 17 g by mouth daily as needed for mild constipation. Mix with 8 oz of water.  . pravastatin (PRAVACHOL) 20 MG tablet Take 1 tablet (20 mg total) by mouth every morning.  . sitaGLIPtin (JANUVIA) 100 MG tablet Take 1 tablet (100 mg total) by mouth daily.  Marland Kitchen  spironolactone (ALDACTONE) 50 MG tablet Take 1 tablet (50 mg total) by mouth daily.  . SURE COMFORT PEN NEEDLES 31G X 8 MM MISC USE AS DIRECTED.  Marland Kitchen tamsulosin (FLOMAX) 0.4 MG CAPS capsule Take 1 capsule (0.4 mg total) by mouth daily.  . temazepam (RESTORIL) 15 MG capsule Take 1 capsule (15 mg total) by mouth at bedtime as needed. for sleep   Facility-Administered Encounter Medications as of 10/30/2017  Medication  . cyanocobalamin ((VITAMIN B-12)) injection 1,000 mcg    Activities of Daily Living In your present state of health, do you have any difficulty performing the following activities: 10/30/2017  Hearing? N  Vision? N  Difficulty concentrating or making decisions? N  Walking or climbing stairs? Y  Dressing or bathing? N  Doing errands, shopping? N  Preparing Food and eating ? N  Using the Toilet? N  In the past six months, have you accidently leaked urine? N  Do you have problems with loss of bowel control? N  Managing your Medications? N  Managing your Finances? N  Housekeeping or managing your Housekeeping? N  Some recent data might be hidden    Patient Care Team: Kerri Perches, MD as PCP - General Rehman, Joline Maxcy, MD as Consulting Physician (Gastroenterology) Candyce Churn, OD (Optometry)   Assessment:   This is a routine wellness examination for Chrissie Noa.  Exercise Activities and Dietary recommendations Current Exercise Habits: Home exercise routine, Time (Minutes): 10, Frequency  (Times/Week): 3, Weekly Exercise (Minutes/Week): 30, Intensity: Mild  Goals    . Exercise 3x per week (30 min per time)     Starting 01/26/2016 patient would like to increase his exercise to 30 minutes a day 3 times a week.    . Quit smoking / using tobacco       Fall Risk Fall Risk  10/30/2017 06/01/2017 10/19/2016 01/26/2016 12/16/2014  Falls in the past year? No No No No No  Risk for fall due to : - - - Impaired vision -   Is the patient's home free of loose throw rugs in walkways, pet beds, electrical cords, etc?   yes      Grab bars in the bathroom? yes      Handrails on the stairs?   yes      Adequate lighting?   yes  Timed Get Up and Go Performed:   Depression Screen PHQ 2/9 Scores 10/30/2017 08/21/2017 08/21/2017 06/01/2017  PHQ - 2 Score 0 0 0 1  PHQ- 9 Score - 1 1 -    Cognitive Function     6CIT Screen 10/30/2017 10/19/2016 01/26/2016  What Year? 0 points 0 points 0 points  What month? 0 points 0 points 0 points  What time? 0 points 0 points 0 points  Count back from 20 - 0 points 0 points  Months in reverse - 0 points 0 points  Repeat phrase - 0 points 2 points  Total Score - 0 2    Immunization History  Administered Date(s) Administered  . Influenza Split 11/08/2010, 11/11/2011  . Influenza Whole 11/17/2006  . Influenza,inj,Quad PF,6+ Mos 10/29/2012, 11/28/2013, 12/16/2014, 10/14/2015, 11/17/2016, 10/30/2017  . Pneumococcal Conjugate-13 01/22/2014  . Pneumococcal Polysaccharide-23 08/29/2003, 10/07/2010  . Td 08/31/2002  . Tdap 12/16/2014  . Zoster 10/07/2010    Qualifies for Shingles Vaccine? Ask insurance if covered   Screening Tests Health Maintenance  Topic Date Due  . INFLUENZA VACCINE  09/14/2017  . HEMOGLOBIN A1C  02/15/2018  . OPHTHALMOLOGY EXAM  04/19/2018  .  FOOT EXAM  06/11/2018  . TETANUS/TDAP  12/15/2024  . PNA vac Low Risk Adult  Completed   Cancer Screenings: Lung: Low Dose CT Chest recommended if Age 34-80 years, 30 pack-year currently  smoking OR have quit w/in 15years. Patient does qualify. (does not want)  Colorectal: up to date   Additional Screenings:  Hepatitis C Screening:      Plan:     I have personally reviewed and noted the following in the patient's chart:   . Medical and social history . Use of alcohol, tobacco or illicit drugs  . Current medications and supplements . Functional ability and status . Nutritional status . Physical activity . Advanced directives . List of other physicians . Hospitalizations, surgeries, and ER visits in previous 12 months . Vitals . Screenings to include cognitive, depression, and falls . Referrals and appointments  In addition, I have reviewed and discussed with patient certain preventive protocols, quality metrics, and best practice recommendations. A written personalized care plan for preventive services as well as general preventive health recommendations were provided to patient.     Everitt AmberBrandi Chancy Smigiel, LPN, LPN  1/61/09609/16/2019

## 2017-11-06 ENCOUNTER — Other Ambulatory Visit: Payer: Self-pay | Admitting: Family Medicine

## 2017-11-13 ENCOUNTER — Ambulatory Visit: Payer: PPO

## 2017-11-30 ENCOUNTER — Ambulatory Visit (INDEPENDENT_AMBULATORY_CARE_PROVIDER_SITE_OTHER): Payer: PPO

## 2017-11-30 DIAGNOSIS — D519 Vitamin B12 deficiency anemia, unspecified: Secondary | ICD-10-CM

## 2017-11-30 MED ORDER — CYANOCOBALAMIN 1000 MCG/ML IJ SOLN
1000.0000 ug | Freq: Once | INTRAMUSCULAR | Status: AC
  Administered 2017-11-30: 1000 ug via INTRAMUSCULAR

## 2017-11-30 NOTE — Progress Notes (Signed)
B-12 given in right deltoid IM- patient to return in one month

## 2017-11-30 NOTE — Addendum Note (Signed)
Addended by: Renee Pain on: 11/30/2017 10:35 AM   Modules accepted: Orders

## 2017-12-01 ENCOUNTER — Other Ambulatory Visit: Payer: Self-pay | Admitting: Family Medicine

## 2017-12-20 ENCOUNTER — Other Ambulatory Visit: Payer: Self-pay | Admitting: Family Medicine

## 2017-12-24 ENCOUNTER — Other Ambulatory Visit: Payer: Self-pay | Admitting: Family Medicine

## 2017-12-24 ENCOUNTER — Telehealth: Payer: Self-pay | Admitting: Family Medicine

## 2017-12-24 MED ORDER — HYDROCODONE-ACETAMINOPHEN 7.5-325 MG PO TABS: ORAL_TABLET | ORAL | 0 refills | Status: DC

## 2017-12-24 NOTE — Telephone Encounter (Signed)
Please disregard this , you do not need to contact the pt , his pharmacy should contact him. I will also attempt one time to contact him

## 2017-12-25 ENCOUNTER — Telehealth: Payer: Self-pay

## 2017-12-25 DIAGNOSIS — E559 Vitamin D deficiency, unspecified: Secondary | ICD-10-CM

## 2017-12-25 DIAGNOSIS — Z794 Long term (current) use of insulin: Secondary | ICD-10-CM | POA: Diagnosis not present

## 2017-12-25 DIAGNOSIS — E119 Type 2 diabetes mellitus without complications: Secondary | ICD-10-CM | POA: Diagnosis not present

## 2017-12-25 DIAGNOSIS — Z125 Encounter for screening for malignant neoplasm of prostate: Secondary | ICD-10-CM

## 2017-12-25 DIAGNOSIS — IMO0001 Reserved for inherently not codable concepts without codable children: Secondary | ICD-10-CM

## 2017-12-25 DIAGNOSIS — E785 Hyperlipidemia, unspecified: Secondary | ICD-10-CM

## 2017-12-25 NOTE — Telephone Encounter (Signed)
Labs ordered.

## 2017-12-26 LAB — HEMOGLOBIN A1C
EAG (MMOL/L): 8.7 (calc)
Hgb A1c MFr Bld: 7.1 % of total Hgb — ABNORMAL HIGH (ref <5.7)
Mean Plasma Glucose: 157 (calc)

## 2017-12-26 LAB — MICROALBUMIN / CREATININE URINE RATIO
Creatinine, Urine: 44 mg/dL (ref 20–320)
Microalb Creat Ratio: 70 mcg/mg creat — ABNORMAL HIGH (ref <30)
Microalb, Ur: 3.1 mg/dL

## 2017-12-26 LAB — BASIC METABOLIC PANEL
BUN / CREAT RATIO: 21 (calc) (ref 6–22)
BUN: 28 mg/dL — AB (ref 7–25)
CHLORIDE: 105 mmol/L (ref 98–110)
CO2: 27 mmol/L (ref 20–32)
CREATININE: 1.31 mg/dL — AB (ref 0.70–1.18)
Calcium: 8.8 mg/dL (ref 8.6–10.3)
Glucose, Bld: 134 mg/dL — ABNORMAL HIGH (ref 65–99)
POTASSIUM: 5.4 mmol/L — AB (ref 3.5–5.3)
Sodium: 137 mmol/L (ref 135–146)

## 2017-12-26 LAB — TSH: TSH: 4.66 mIU/L — ABNORMAL HIGH (ref 0.40–4.50)

## 2017-12-26 LAB — VITAMIN D 25 HYDROXY (VIT D DEFICIENCY, FRACTURES): Vit D, 25-Hydroxy: 23 ng/mL — ABNORMAL LOW (ref 30–100)

## 2017-12-26 LAB — T3, FREE: T3, Free: 2.7 pg/mL (ref 2.3–4.2)

## 2017-12-26 LAB — PSA: PSA: 0.6 ng/mL (ref <=4.0)

## 2017-12-26 LAB — T4, FREE: Free T4: 0.9 ng/dL (ref 0.8–1.8)

## 2017-12-29 NOTE — Telephone Encounter (Signed)
To Brandi 12-29-17

## 2018-01-01 ENCOUNTER — Ambulatory Visit: Payer: PPO | Admitting: Family Medicine

## 2018-01-02 ENCOUNTER — Ambulatory Visit (INDEPENDENT_AMBULATORY_CARE_PROVIDER_SITE_OTHER): Payer: PPO | Admitting: Family Medicine

## 2018-01-02 ENCOUNTER — Encounter: Payer: Self-pay | Admitting: Family Medicine

## 2018-01-02 VITALS — BP 150/82 | HR 74 | Resp 16 | Ht 69.0 in | Wt 147.0 lb

## 2018-01-02 DIAGNOSIS — E119 Type 2 diabetes mellitus without complications: Secondary | ICD-10-CM | POA: Diagnosis not present

## 2018-01-02 DIAGNOSIS — G8929 Other chronic pain: Secondary | ICD-10-CM

## 2018-01-02 DIAGNOSIS — E785 Hyperlipidemia, unspecified: Secondary | ICD-10-CM | POA: Diagnosis not present

## 2018-01-02 DIAGNOSIS — F17200 Nicotine dependence, unspecified, uncomplicated: Secondary | ICD-10-CM | POA: Diagnosis not present

## 2018-01-02 DIAGNOSIS — E1140 Type 2 diabetes mellitus with diabetic neuropathy, unspecified: Secondary | ICD-10-CM

## 2018-01-02 DIAGNOSIS — IMO0001 Reserved for inherently not codable concepts without codable children: Secondary | ICD-10-CM

## 2018-01-02 DIAGNOSIS — Z794 Long term (current) use of insulin: Secondary | ICD-10-CM | POA: Diagnosis not present

## 2018-01-02 DIAGNOSIS — N521 Erectile dysfunction due to diseases classified elsewhere: Secondary | ICD-10-CM

## 2018-01-02 DIAGNOSIS — D519 Vitamin B12 deficiency anemia, unspecified: Secondary | ICD-10-CM | POA: Diagnosis not present

## 2018-01-02 MED ORDER — CYANOCOBALAMIN 1000 MCG/ML IJ SOLN
1000.0000 ug | Freq: Once | INTRAMUSCULAR | Status: AC
  Administered 2018-01-02: 1000 ug via INTRAMUSCULAR

## 2018-01-02 MED ORDER — AMLODIPINE BESYLATE 10 MG PO TABS: 10.0000 mg | ORAL_TABLET | Freq: Every day | ORAL | 1 refills | Status: DC

## 2018-01-02 MED ORDER — SITAGLIPTIN PHOSPHATE 100 MG PO TABS: 100.0000 mg | ORAL_TABLET | Freq: Every day | ORAL | 1 refills | Status: DC

## 2018-01-02 MED ORDER — TEMAZEPAM 15 MG PO CAPS: 15.0000 mg | ORAL_CAPSULE | Freq: Every evening | ORAL | 1 refills | Status: DC | PRN

## 2018-01-02 MED ORDER — TAMSULOSIN HCL 0.4 MG PO CAPS: 0.4000 mg | ORAL_CAPSULE | Freq: Every day | ORAL | 1 refills | Status: DC

## 2018-01-02 MED ORDER — GABAPENTIN 300 MG PO CAPS: ORAL_CAPSULE | ORAL | 1 refills | Status: DC

## 2018-01-02 MED ORDER — PRAVASTATIN SODIUM 20 MG PO TABS: 20.0000 mg | ORAL_TABLET | Freq: Every morning | ORAL | 1 refills | Status: DC

## 2018-01-02 MED ORDER — SPIRONOLACTONE 50 MG PO TABS: 50.0000 mg | ORAL_TABLET | Freq: Every day | ORAL | 1 refills | Status: DC

## 2018-01-02 NOTE — Progress Notes (Signed)
Donald Everett     MRN: [id number]      DOB: November 11, 1940   HPI [name] is here for follow up and re-evaluation of chronic medical conditions, medication management and review of any available recent lab and radiology data.  Preventive health is updated, specifically  Cancer screening and Immunization.   Questions or concerns regarding consultations or procedures which the PT has had in the interim are  addressed. The PT denies any adverse reactions to current medications since the last visit.  There are no new concerns.  There are no specific complaints  Denies polyuria, polydipsia, blurred vision , or hypoglycemic episodes.   ROS Denies recent fever or chills. Denies sinus pressure, nasal congestion, ear pain or sore throat. Denies chest congestion, productive cough or wheezing. Denies chest pains, palpitations and leg swelling Denies abdominal pain, nausea, vomiting,diarrhea or constipation.   Denies dysuria, frequency, hesitancy or incontinence. Denies uncontrolled  joint pain, swelling and limitation in mobility. Denies headaches, seizures, numbness, or tingling. Denies depression, anxiety or insomnia. Denies skin break down or rash.   PE  BP (!) 150/82   Pulse 74   Resp 16   Ht 5\' 9"  (1.753 m)   Wt 147 lb (66.7 kg)   SpO2 97%   BMI 21.71 kg/m   Patient alert and oriented and in no cardiopulmonary distress.  HEENT: No facial asymmetry, EOMI,   oropharynx pink and moist.  Neck supple no JVD, no mass.  Chest: Clear to auscultation bilaterally.decreased air entry  CVS: S1, S2 no murmurs, no S3.Regular rate.  ABD: Soft non tender.   Ext: No edema  WU:[id number] ROM spine, shoulders, hips and knees.  Skin: Intact, no ulcerations or rash noted.  Psych: Good eye contact, normal affect. Memory intact not anxious or depressed appearing.  CNS: CN 2-12 intact, power,  normal throughout.no focal deficits noted.   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 is reminded of the importance of commitment to daily physical activity for 30 minutes or more, as able and the need to limit carbohydrate intake to 30 to 60 grams per meal to help with blood sugar control.   The need to take medication as prescribed, test blood sugar as directed, and to call between visits if there is a concern that blood sugar is uncontrolled is also discussed.   [name] is reminded of the importance of daily foot exam, annual eye examination, and good blood sugar, blood pressure and cholesterol control.  Diabetic Labs Latest Ref Rng & Units 12/25/2017 08/15/2017 03/23/2017 11/17/2016 11/11/2016  HbA1c <5.7 % of total Hgb 7.1(H) 6.9(H) 7.5(H) - 7.3(H)  Microalbumin mg/dL 3.1 - - - 0.7  Micro/Creat Ratio <30 mcg/mg creat 70(H) - - - 22  Chol <200 mg/dL - 295 - - 621  HDL >30 mg/dL - 86(V) - - 78(I)  Calc LDL mg/dL (calc) - 57 - - 58  Triglycerides <150 mg/dL - 85 - - 696  Creatinine 0.70 - 1.18 mg/dL 2.95(M) 8.41(L) 2.44(W) 1.48(H) 1.52(H)   BP/Weight 01/02/2018 10/30/2017 10/30/2017 08/21/2017 06/01/2017 01/31/2017 11/17/2016  Systolic BP 150 140 140 140 140 138 120  Diastolic BP 82 64 64 60 62 70 60  Wt. (Lbs) 147 142 142 144 140 145 145  BMI 21.71 20.97 20.97 21.27 20.67 21.41 21.41   Foot/eye exam completion dates Latest Ref Rng & Units 06/01/2017 04/18/2017  Eye Exam No Retinopathy - Retinopathy(A)  Foot Form Completion - Done -        NICOTINE ADDICTION Asked:confirms currently smokes cigarettes Assess: Unwilling to quit but cutting back Advise: needs to QUIT to reduce risk of cancer, cardio and cerebrovascular disease Assist: counseled for 5 minutes and literature provided Arrange: follow up in 3 months   B12 deficiency anemia 1cc IM administered with no adverse effect  Encounter for chronic pain management The patient's Controlled Substance registry is reviewed and  compliance confirmed. Adequacy of  Pain control and level of function is assessed. Medication dosing is adjusted as deemed appropriate. Twelve weeks of medication is prescribed , patient signs for the script and is provided with a follow up appointment between 11 to 12 weeks .          ID Written Drug Qty Days Prescriber Rx # Pharmacy Refill Daily Dose * Pymt Type PMP  12/24/2017  1   12/24/2017  Hydrocodone-Acetamin 7.5-325  60.00 30 Ma Donald Everett  [id number]  Car ([id number])  0/0 15.00 MME Comm Ins  [place]  11/20/2017  1   08/29/2017  Hydrocodone-Acetamin 7.5-325  60.00 30 Ma Donald Everett  [id number]  Car ([id number])  0/0 15.00 MME Comm Ins  [place]  10/11/2017  1   08/29/2017  Hydrocodone-Acetamin 7.5-325  60.00 30 Ma Donald Everett  [id number]  Car ([id number])  0/0 15.00 MME Comm Ins  [place]  10/05/2017  1   06/01/2017  Temazepam 15 Mg Capsule  90.00 90 Ma Donald Everett  [id number]  Car ([id number])  1/1 0.75 LME Comm Ins  [place]  09/11/2017  1   08/29/2017  Hydrocodone-Acetamin 7.5-325  60.00 30 Ma Donald Everett  [id number]  Car ([id number])  0/0 15.00 MME Comm Ins  [place]  08/10/2017  1   06/01/2017  Hydrocodone-Acetamin 7.5-325  60.00 30 Ma Donald Everett  [id number]  Car ([id number])  0/0 15.00 MME Comm Ins  [place]  07/10/2017  1   06/01/2017  Hydrocodone-Acetamin 7.5-325  60.00 30 Ma Donald Everett  [id number]  Car ([id number])  0/0 15.00 MME Comm Ins  [place]  07/07/2017  1   06/01/2017  Temazepam 15 Mg Capsule  90.00 90 Ma Donald Everett  [id number]  Car ([id number])  0/1 0.75 LME Comm Ins  [place]  06/12/2017  1   06/01/2017  Hydrocodone-Acetamin 7.5-325  60.00 30 Ma Donald Everett  [id number]  Car ([id number])  0/0 15.00 MME Comm Ins  [place]  05/09/2017  1   01/31/2017  Hydrocodone-Acetamin 7.5-325  60.00 30 Ma Donald Everett  [id number]  Car ([id number])  0/0 15.00 MME Comm Ins  [place]  04/10/2017  1   04/10/2017  Temazepam 15 Mg Capsule  90.00 90 Ma Donald Everett  [id number]  Car ([id number])  0/0 0.75

## 2018-01-02 NOTE — Patient Instructions (Addendum)
F/U week of March 9, call if you need me sooner  Blood sugar is good  Please cut back on salt and processed foods, BP is higjh  Cut cigarettes by 2 each month  Collect med at pharmacy  B12 injection today by nurse  Fasting lipid, cmp and and EGFr, hBA1C and CBC 1 week before March appt   All the best for 2020

## 2018-01-05 ENCOUNTER — Other Ambulatory Visit: Payer: Self-pay | Admitting: Family Medicine

## 2018-01-06 ENCOUNTER — Encounter: Payer: Self-pay | Admitting: Family Medicine

## 2018-01-06 MED ORDER — HYDROCODONE-ACETAMINOPHEN 7.5-325 MG PO TABS: ORAL_TABLET | ORAL | 0 refills | Status: AC

## 2018-01-06 NOTE — Assessment & Plan Note (Signed)
1cc IM administered with no adverse effect

## 2018-01-06 NOTE — Assessment & Plan Note (Signed)
Asked:confirms currently smokes cigarettes Assess: Unwilling to quit but cutting back Advise: needs to QUIT to reduce risk of cancer, cardio and cerebrovascular disease Assist: counseled for 5 minutes and literature provided Arrange: follow up in 3 months  

## 2018-01-06 NOTE — Assessment & Plan Note (Signed)
The patient's Controlled Substance registry is reviewed and compliance confirmed. Adequacy of  Pain control and level of function is assessed. Medication dosing is adjusted as deemed appropriate. Twelve weeks of medication is prescribed , patient signs for the script and is provided with a follow up appointment between 11 to 12 weeks .  

## 2018-01-06 NOTE — Assessment & Plan Note (Signed)
Controlled, no change in medication [name] is reminded of the importance of commitment to daily physical activity for 30 minutes or more, as able and the need to limit carbohydrate intake to 30 to 60 grams per meal to help with blood sugar control.   The need to take medication as prescribed, test blood sugar as directed, and to call between visits if there is a concern that blood sugar is uncontrolled is also discussed.   [name] is reminded of the importance of daily foot exam, annual eye examination, and good blood sugar, blood pressure and cholesterol control.  Diabetic Labs Latest Ref Rng & Units 12/25/2017 08/15/2017 03/23/2017 11/17/2016 11/11/2016  HbA1c <5.7 % of total Hgb 7.1(H) 6.9(H) 7.5(H) - 7.3(H)  Microalbumin mg/dL 3.1 - - - 0.7  Micro/Creat Ratio <30 mcg/mg creat 70(H) - - - 22  Chol <200 mg/dL - 161103 - - 096101  HDL >04>40 mg/dL - 54(U29(L) - - 98(J24(L)  Calc LDL mg/dL (calc) - 57 - - 58  Triglycerides <150 mg/dL - 85 - - 191106  Creatinine 0.70 - 1.18 mg/dL 4.78(G1.31(H) 9.56(O1.48(H) 1.30(Q1.32(H) 1.48(H) 1.52(H)   BP/Weight 01/02/2018 10/30/2017 10/30/2017 08/21/2017 06/01/2017 01/31/2017 11/17/2016  Systolic BP 150 140 140 140 140 138 120  Diastolic BP 82 64 64 60 62 70 60  Wt. (Lbs) 147 142 142 144 140 145 145  BMI 21.71 20.97 20.97 21.27 20.67 21.41 21.41   Foot/eye exam completion dates Latest Ref Rng & Units 06/01/2017 04/18/2017  Eye Exam No Retinopathy - Retinopathy(A)  Foot Form Completion - Done -

## 2018-01-29 ENCOUNTER — Ambulatory Visit (INDEPENDENT_AMBULATORY_CARE_PROVIDER_SITE_OTHER): Payer: PPO

## 2018-01-29 ENCOUNTER — Other Ambulatory Visit: Payer: Self-pay | Admitting: Family Medicine

## 2018-01-29 DIAGNOSIS — D519 Vitamin B12 deficiency anemia, unspecified: Secondary | ICD-10-CM | POA: Diagnosis not present

## 2018-01-29 MED ORDER — CYANOCOBALAMIN 1000 MCG/ML IJ SOLN
1000.0000 ug | Freq: Once | INTRAMUSCULAR | Status: AC
  Administered 2018-01-29: 1000 ug via INTRAMUSCULAR

## 2018-01-29 NOTE — Progress Notes (Signed)
b12 given in right deltoid with no complications 

## 2018-02-02 ENCOUNTER — Other Ambulatory Visit: Payer: Self-pay | Admitting: Family Medicine

## 2018-02-02 DIAGNOSIS — D519 Vitamin B12 deficiency anemia, unspecified: Secondary | ICD-10-CM

## 2018-02-08 ENCOUNTER — Other Ambulatory Visit: Payer: Self-pay | Admitting: Family Medicine

## 2018-02-13 ENCOUNTER — Encounter: Payer: Self-pay | Admitting: *Deleted

## 2018-03-01 ENCOUNTER — Ambulatory Visit (INDEPENDENT_AMBULATORY_CARE_PROVIDER_SITE_OTHER): Payer: PPO

## 2018-03-01 DIAGNOSIS — D519 Vitamin B12 deficiency anemia, unspecified: Secondary | ICD-10-CM | POA: Diagnosis not present

## 2018-03-01 MED ORDER — CYANOCOBALAMIN 1000 MCG/ML IJ SOLN
1000.0000 ug | Freq: Once | INTRAMUSCULAR | Status: AC
  Administered 2018-03-01: 1000 ug via INTRAMUSCULAR

## 2018-03-01 NOTE — Progress Notes (Signed)
Patient comes in today for Vitamin B12 injection. Patient requested injection be given in deltoid instead of hip. Gave injection in left deltoid with no immediate reaction.

## 2018-03-01 NOTE — Progress Notes (Signed)
B-12 given 1,024mcg IM right deltoid

## 2018-03-21 ENCOUNTER — Encounter: Payer: Self-pay | Admitting: Family Medicine

## 2018-04-02 ENCOUNTER — Ambulatory Visit (INDEPENDENT_AMBULATORY_CARE_PROVIDER_SITE_OTHER): Payer: PPO

## 2018-04-02 DIAGNOSIS — D519 Vitamin B12 deficiency anemia, unspecified: Secondary | ICD-10-CM

## 2018-04-02 MED ORDER — CYANOCOBALAMIN 1000 MCG/ML IJ SOLN
1000.0000 ug | Freq: Once | INTRAMUSCULAR | Status: AC
  Administered 2018-04-02: 1000 ug via INTRAMUSCULAR

## 2018-04-11 ENCOUNTER — Telehealth: Payer: Self-pay | Admitting: Family Medicine

## 2018-04-11 NOTE — Telephone Encounter (Signed)
PT LVM regarding his Hydrocodone refills. Please call [phone]

## 2018-04-11 NOTE — Telephone Encounter (Signed)
One already in for 2/17 to 3/18, please call and verify with pharmacy

## 2018-04-11 NOTE — Telephone Encounter (Signed)
They had missed the prescription but once I called they were able to find it and are getting it ready for him and the pt is aware

## 2018-04-18 ENCOUNTER — Telehealth: Payer: Self-pay | Admitting: Family Medicine

## 2018-04-18 NOTE — Telephone Encounter (Signed)
PT LVM to get results of blood work

## 2018-04-19 ENCOUNTER — Telehealth: Payer: Self-pay

## 2018-04-19 ENCOUNTER — Other Ambulatory Visit: Payer: Self-pay

## 2018-04-19 DIAGNOSIS — Z794 Long term (current) use of insulin: Secondary | ICD-10-CM | POA: Diagnosis not present

## 2018-04-19 DIAGNOSIS — E119 Type 2 diabetes mellitus without complications: Secondary | ICD-10-CM | POA: Diagnosis not present

## 2018-04-19 DIAGNOSIS — E785 Hyperlipidemia, unspecified: Secondary | ICD-10-CM | POA: Diagnosis not present

## 2018-04-19 MED ORDER — SODIUM POLYSTYRENE SULFONATE 15 GM/60ML PO SUSP: ORAL | 0 refills | Status: DC

## 2018-04-19 NOTE — Progress Notes (Signed)
Lisa from [facility] called to report critical high value: potassium 6.2. Rept and verified. Tereasa Coop, DNP notified and Barnett Abu was prescribed.  Patient notified and denied any heart flutters, pain or SOB. States he will get the medication delivered first thing tomorrow morning and will go to the ER if symptoms mentioned above develop. Has appt Tues and repeat labs to be done at that time.

## 2018-04-19 NOTE — Telephone Encounter (Signed)
Spoke with patient and he was calling to get lab orders. He went and had labs drawn this morning.

## 2018-04-19 NOTE — Telephone Encounter (Signed)
Danelle Berry from [facility] called to report critical high value: potassium 6.2. Rept and verified. Tereasa Coop, DNP notified and Donald Everett was prescribed.  Patient notified and denied any heart flutters, pain or SOB. States he will get the medication delivered first thing tomorrow morning and will go to the ER if symptoms mentioned above develop. Has appt Tues and repeat labs to be done at that time.

## 2018-04-20 ENCOUNTER — Telehealth: Payer: Self-pay

## 2018-04-20 DIAGNOSIS — E875 Hyperkalemia: Secondary | ICD-10-CM

## 2018-04-20 LAB — COMPLETE METABOLIC PANEL WITH GFR
AG Ratio: 1.3 (calc) (ref 1.0–2.5)
ALBUMIN MSPROF: 3.5 g/dL — AB (ref 3.6–5.1)
ALKALINE PHOSPHATASE (APISO): 64 U/L (ref 35–144)
ALT: 10 U/L (ref 9–46)
AST: 19 U/L (ref 10–35)
BILIRUBIN TOTAL: 0.4 mg/dL (ref 0.2–1.2)
BUN / CREAT RATIO: 17 (calc) (ref 6–22)
BUN: 27 mg/dL — ABNORMAL HIGH (ref 7–25)
CO2: 26 mmol/L (ref 20–32)
CREATININE: 1.57 mg/dL — AB (ref 0.70–1.18)
Calcium: 9.3 mg/dL (ref 8.6–10.3)
Chloride: 104 mmol/L (ref 98–110)
GFR, Est African American: 49 mL/min/{1.73_m2} — ABNORMAL LOW (ref >=60)
GFR, Est Non African American: 42 mL/min/{1.73_m2} — ABNORMAL LOW (ref >=60)
GLOBULIN: 2.6 g/dL (ref 1.9–3.7)
Glucose, Bld: 140 mg/dL — ABNORMAL HIGH (ref 65–99)
POTASSIUM: 6.2 mmol/L — AB (ref 3.5–5.3)
SODIUM: 134 mmol/L — AB (ref 135–146)
Total Protein: 6.1 g/dL (ref 6.1–8.1)

## 2018-04-20 LAB — HEMOGLOBIN A1C
EAG (MMOL/L): 8.9 (calc)
HEMOGLOBIN A1C: 7.2 %{Hb} — AB (ref <5.7)
Mean Plasma Glucose: 160 (calc)

## 2018-04-20 LAB — CBC
HCT: 37 % — ABNORMAL LOW (ref 38.5–50.0)
Hemoglobin: 12.5 g/dL — ABNORMAL LOW (ref 13.2–17.1)
MCH: 30.4 pg (ref 27.0–33.0)
MCHC: 33.8 g/dL (ref 32.0–36.0)
MCV: 90 fL (ref 80.0–100.0)
MPV: 11 fL (ref 7.5–12.5)
PLATELETS: 228 10*3/uL (ref 140–400)
RBC: 4.11 10*6/uL — ABNORMAL LOW (ref 4.20–5.80)
RDW: 13.1 % (ref 11.0–15.0)
WBC: 9.6 10*3/uL (ref 3.8–10.8)

## 2018-04-20 LAB — LIPID PANEL
CHOL/HDL RATIO: 3.9 (calc) (ref <5.0)
CHOLESTEROL: 108 mg/dL (ref <200)
HDL: 28 mg/dL — ABNORMAL LOW (ref >=40)
LDL Cholesterol (Calc): 63 mg/dL (calc)
Non-HDL Cholesterol (Calc): 80 mg/dL (calc) (ref <130)
TRIGLYCERIDES: 90 mg/dL (ref <150)

## 2018-04-20 NOTE — Telephone Encounter (Signed)
-----  Message from Margaret E Simpson, MD sent at 04/20/2018  6:27 AM EST ----- pls advise pt to ensure he drinks at least 64 oz water daily , also the same day when coming to rept lab, and order stat chem 7 and EGFR need result before 4 pm the day drawn so may best get it at the hospital 

## 2018-04-23 DIAGNOSIS — H5203 Hypermetropia, bilateral: Secondary | ICD-10-CM | POA: Diagnosis not present

## 2018-04-23 DIAGNOSIS — Z794 Long term (current) use of insulin: Secondary | ICD-10-CM | POA: Diagnosis not present

## 2018-04-23 DIAGNOSIS — H25813 Combined forms of age-related cataract, bilateral: Secondary | ICD-10-CM | POA: Diagnosis not present

## 2018-04-23 DIAGNOSIS — E119 Type 2 diabetes mellitus without complications: Secondary | ICD-10-CM | POA: Diagnosis not present

## 2018-04-23 DIAGNOSIS — H524 Presbyopia: Secondary | ICD-10-CM | POA: Diagnosis not present

## 2018-04-23 DIAGNOSIS — H52203 Unspecified astigmatism, bilateral: Secondary | ICD-10-CM | POA: Diagnosis not present

## 2018-04-23 LAB — HM DIABETES EYE EXAM

## 2018-04-24 ENCOUNTER — Ambulatory Visit (INDEPENDENT_AMBULATORY_CARE_PROVIDER_SITE_OTHER): Payer: PPO | Admitting: Family Medicine

## 2018-04-24 ENCOUNTER — Encounter: Payer: Self-pay | Admitting: Family Medicine

## 2018-04-24 ENCOUNTER — Other Ambulatory Visit: Payer: Self-pay

## 2018-04-24 VITALS — BP 160/78 | HR 73 | Resp 15 | Ht 69.0 in | Wt 146.0 lb

## 2018-04-24 DIAGNOSIS — E119 Type 2 diabetes mellitus without complications: Secondary | ICD-10-CM | POA: Diagnosis not present

## 2018-04-24 DIAGNOSIS — E785 Hyperlipidemia, unspecified: Secondary | ICD-10-CM | POA: Diagnosis not present

## 2018-04-24 DIAGNOSIS — N521 Erectile dysfunction due to diseases classified elsewhere: Secondary | ICD-10-CM | POA: Diagnosis not present

## 2018-04-24 DIAGNOSIS — G8929 Other chronic pain: Secondary | ICD-10-CM | POA: Diagnosis not present

## 2018-04-24 DIAGNOSIS — F17200 Nicotine dependence, unspecified, uncomplicated: Secondary | ICD-10-CM

## 2018-04-24 DIAGNOSIS — Z794 Long term (current) use of insulin: Secondary | ICD-10-CM

## 2018-04-24 DIAGNOSIS — D519 Vitamin B12 deficiency anemia, unspecified: Secondary | ICD-10-CM

## 2018-04-24 DIAGNOSIS — IMO0001 Reserved for inherently not codable concepts without codable children: Secondary | ICD-10-CM

## 2018-04-24 DIAGNOSIS — E875 Hyperkalemia: Secondary | ICD-10-CM | POA: Diagnosis not present

## 2018-04-24 DIAGNOSIS — I150 Renovascular hypertension: Secondary | ICD-10-CM

## 2018-04-24 DIAGNOSIS — E1140 Type 2 diabetes mellitus with diabetic neuropathy, unspecified: Secondary | ICD-10-CM

## 2018-04-24 LAB — COMPLETE METABOLIC PANEL WITH GFR
AG Ratio: 1.6 (calc) (ref 1.0–2.5)
ALT: 11 U/L (ref 9–46)
AST: 18 U/L (ref 10–35)
Albumin: 3.5 g/dL — ABNORMAL LOW (ref 3.6–5.1)
Alkaline phosphatase (APISO): 59 U/L (ref 35–144)
BILIRUBIN TOTAL: 0.3 mg/dL (ref 0.2–1.2)
BUN / CREAT RATIO: 16 (calc) (ref 6–22)
BUN: 23 mg/dL (ref 7–25)
CO2: 27 mmol/L (ref 20–32)
Calcium: 8.6 mg/dL (ref 8.6–10.3)
Chloride: 102 mmol/L (ref 98–110)
Creat: 1.42 mg/dL — ABNORMAL HIGH (ref 0.70–1.18)
GFR, Est African American: 55 mL/min/{1.73_m2} — ABNORMAL LOW (ref >=60)
GFR, Est Non African American: 47 mL/min/{1.73_m2} — ABNORMAL LOW (ref >=60)
Globulin: 2.2 g/dL (calc) (ref 1.9–3.7)
Glucose, Bld: 222 mg/dL — ABNORMAL HIGH (ref 65–139)
Potassium: 4.7 mmol/L (ref 3.5–5.3)
Sodium: 134 mmol/L — ABNORMAL LOW (ref 135–146)
Total Protein: 5.7 g/dL — ABNORMAL LOW (ref 6.1–8.1)

## 2018-04-24 MED ORDER — HYDRALAZINE HCL 25 MG PO TABS: 25.0000 mg | ORAL_TABLET | Freq: Three times a day (TID) | ORAL | 4 refills | Status: DC

## 2018-04-24 MED ORDER — PRAVASTATIN SODIUM 10 MG PO TABS: 10.0000 mg | ORAL_TABLET | Freq: Every day | ORAL | 3 refills | Status: DC

## 2018-04-24 MED ORDER — LOSARTAN POTASSIUM 100 MG PO TABS: 100.0000 mg | ORAL_TABLET | Freq: Every day | ORAL | 1 refills | Status: DC

## 2018-04-24 MED ORDER — TEMAZEPAM 15 MG PO CAPS: 15.0000 mg | ORAL_CAPSULE | Freq: Every evening | ORAL | 1 refills | Status: DC | PRN

## 2018-04-24 MED ORDER — SPIRONOLACTONE 50 MG PO TABS: 50.0000 mg | ORAL_TABLET | Freq: Every day | ORAL | 1 refills | Status: DC

## 2018-04-24 NOTE — Patient Instructions (Addendum)
Nurse visit for B12 and MD will review BP at that time, on 05/03/2018  MD follow up of June 22, call if you need me sooner  HBA1C, chem 7 and EGFr 1 week before June appt  Lower dose of pravstatin 10 mg daily when next you fill  STOP spironolactone for blood pressure  New is hydralazine 25 mg omnne tablet at 8am, 3pm, and 10pm   Congrats on excellent blood suga rand cholesterol  Try to gwet to 16 ciggs/ day or less in the next 2  Months  Thank you  for choosing [facility]. We consider it a privelige to serve you.  Delivering excellent health care in a caring and  compassionate way is our goal.  Partnering with you,  so that together we can achieve this goal is our strategy.    

## 2018-04-29 ENCOUNTER — Encounter: Payer: Self-pay | Admitting: Family Medicine

## 2018-04-29 MED ORDER — HYDROCODONE-ACETAMINOPHEN 7.5-325 MG PO TABS: ORAL_TABLET | ORAL | 0 refills | Status: DC

## 2018-04-29 MED ORDER — HYDROCODONE-ACETAMINOPHEN 7.5-325 MG PO TABS: ORAL_TABLET | ORAL | 0 refills | Status: AC

## 2018-04-29 NOTE — Assessment & Plan Note (Signed)
Controlled, no change in medication Mr. Bigos is reminded of the importance of commitment to daily physical activity for 30 minutes or more, as able and the need to limit carbohydrate intake to 30 to 60 grams per meal to help with blood sugar control.   The need to take medication as prescribed, test blood sugar as directed, and to call between visits if there is a concern that blood sugar is uncontrolled is also discussed.   Mr. Mohar is reminded of the importance of daily foot exam, annual eye examination, and good blood sugar, blood pressure and cholesterol control.  Diabetic Labs Latest Ref Rng & Units 04/24/2018 04/19/2018 12/25/2017 08/15/2017 03/23/2017  HbA1c <5.7 % of total Hgb - 7.2(H) 7.1(H) 6.9(H) 7.5(H)  Microalbumin mg/dL - - 3.1 - -  Micro/Creat Ratio <30 mcg/mg creat - - 70(H) - -  Chol <200 mg/dL - 559 - 741 -  HDL > OR = 40 mg/dL - 63(A) - 45(X) -  Calc LDL mg/dL (calc) - 63 - 57 -  Triglycerides <150 mg/dL - 90 - 85 -  Creatinine 0.70 - 1.18 mg/dL 6.46(O) 0.32(Z) 2.24(M) 1.48(H) 1.32(H)   BP/Weight 04/24/2018 01/02/2018 10/30/2017 10/30/2017 08/21/2017 06/01/2017 01/31/2017  Systolic BP 160 150 140 140 140 140 138  Diastolic BP 78 82 64 64 60 62 70  Wt. (Lbs) 146 147 142 142 144 140 145  BMI 21.56 21.71 20.97 20.97 21.27 20.67 21.41   Foot/eye exam completion dates Latest Ref Rng & Units 06/01/2017 04/18/2017  Eye Exam No Retinopathy - Retinopathy(A)  Foot Form Completion - Done -

## 2018-04-29 NOTE — Assessment & Plan Note (Signed)
The patient's Controlled Substance registry is reviewed and compliance confirmed. Adequacy of  Pain control and level of function is assessed. Medication dosing is adjusted as deemed appropriate. Twelve weeks of medication is prescribed , patient signs for the script and is provided with a follow up appointment between 11 to 12 weeks .  

## 2018-04-29 NOTE — Assessment & Plan Note (Signed)
Recurrent problem stop spironolactone, rept test shows normal potassium

## 2018-04-29 NOTE — Progress Notes (Signed)
Donald Everett     MRN: [id number]      DOB: 05-30-1940   HPI Donald Everett is here for follow up and re-evaluation of chronic medical conditions, medication management and review of any available recent lab and radiology data.  Preventive health is updated, specifically  Cancer screening and Immunization.   Questions or concerns regarding consultations or procedures which the PT has had in the interim are  addressed. The PT denies any adverse reactions to current medications since the last visit.  There are no new concerns.  There are no specific complaints  Denies polyuria, polydipsia, blurred vision , or hypoglycemic episodes.   ROS Denies recent fever or chills. Denies sinus pressure, nasal congestion, ear pain or sore throat. Denies chest congestion, productive cough or wheezing. Denies chest pains, palpitations and leg swelling Denies abdominal pain, nausea, vomiting,diarrhea or constipation.   Denies dysuria, frequency, hesitancy or incontinence. Denies uncontrolled joint pain, swelling and limitation in mobility. Denies headaches, seizures, numbness, or tingling. Denies depression, anxiety or insomnia. Denies skin break down or rash.   PE  BP (!) 160/78   Pulse 73   Resp 15   Ht 5\' 9"  (1.753 m)   Wt 146 lb (66.2 kg)   SpO2 99%   BMI 21.56 kg/m   Patient alert and oriented and in no cardiopulmonary distress.  HEENT: No facial asymmetry, EOMI,   oropharynx pink and moist.  Neck supple no JVD, no mass.  Chest: Clear to auscultation bilaterally.Decreased air entry  CVS: S1, S2 no murmurs, no S3.Regular rate.  ABD: Soft non tender.   Ext: No edema  MS: decreased  ROM spine, shoulders, hips and knees.  Skin: Intact, no ulcerations or rash noted.  Psych: Good eye contact, normal affect. Memory intact not anxious or depressed appearing.  CNS: CN 2-12 intact, power,  normal throughout.no focal deficits noted.Marked hearing loss  Assessment & Plan  NICOTINE  ADDICTION Asked:confirms currently smokes cigarettes Assess: Unwilling to quit but cutting back Advise: needs to QUIT to reduce risk of cancer, cardio and cerebrovascular disease Assist: counseled for 5 minutes and literature provided Arrange: follow up in 3 months   Renovascular hypertension uncontrolled with recurrent hyperkalemia. Stop spironolactone and change to hydralazine , recheck in the next 2 weeks DASH diet and commitment to daily physical activity for a minimum of 30 minutes discussed and encouraged, as a part of hypertension management. The importance of attaining a healthy weight is also discussed.  BP/Weight 04/24/2018 01/02/2018 10/30/2017 10/30/2017 08/21/2017 06/01/2017 01/31/2017  Systolic BP 160 150 140 140 140 140 138  Diastolic BP 78 82 64 64 60 62 70  Wt. 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   The need to take medication as prescribed, test blood sugar as directed, and to call between visits if there is a concern that blood sugar is uncontrolled is also discussed.   Donald Everett is reminded of the importance of daily foot exam, annual eye examination, and good blood sugar, blood pressure and cholesterol  control.  Diabetic Labs Latest Ref Rng & Units 04/24/2018 04/19/2018 12/25/2017 08/15/2017 03/23/2017  HbA1c <5.7 % of total Hgb - 7.2(H) 7.1(H) 6.9(H) 7.5(H)  Microalbumin mg/dL - - 3.1 - -  Micro/Creat Ratio <30 mcg/mg creat - - 70(H) - -  Chol <200 mg/dL - 867 - 619 -  HDL > OR = 40 mg/dL - 50(D) - 32(I) -  Calc LDL  mg/dL (calc) - 63 - 57 -  Triglycerides <150 mg/dL - 90 - 85 -  Creatinine 0.70 - 1.18 mg/dL 7.12(W) 5.80(D) 9.83(J) 1.48(H) 1.32(H)   BP/Weight 04/24/2018 01/02/2018 10/30/2017 10/30/2017 08/21/2017 06/01/2017 01/31/2017  Systolic BP 160 150 140 140 140 140 138  Diastolic BP 78 82 64 64 60 62 70  Wt. (Lbs) 146 147 142 142 144 140 145  BMI 21.56 21.71 20.97 20.97 21.27 20.67 21.41   Foot/eye exam completion dates Latest Ref Rng & Units 06/01/2017 04/18/2017  Eye Exam No Retinopathy - Retinopathy(A)  Foot Form Completion - Done -        Encounter for chronic pain management The patient's Controlled Substance registry is reviewed and compliance confirmed. Adequacy of  Pain control and level of function is assessed. Medication dosing is adjusted as deemed appropriate. Twelve weeks of medication is prescribed , patient signs for the script and is provided with a follow up appointment between 11 to 12 weeks .   Hyperlipidemia LDL goal <100 Hyperlipidemia:Low fat diet discussed and encouraged.   Lipid Panel  Lab Results  Component Value Date   CHOL 108 04/19/2018   HDL 28 (L) 04/19/2018   LDLCALC 63 04/19/2018   TRIG 90 04/19/2018   CHOLHDL 3.9 04/19/2018   Controlled, no change in medication

## 2018-04-29 NOTE — Assessment & Plan Note (Signed)
Hyperlipidemia:Low fat diet discussed and encouraged.   Lipid Panel  Lab Results  Component Value Date   CHOL 108 04/19/2018   HDL 28 (L) 04/19/2018   LDLCALC 63 04/19/2018   TRIG 90 04/19/2018   CHOLHDL 3.9 04/19/2018   Controlled, no change in medication

## 2018-04-29 NOTE — Assessment & Plan Note (Signed)
uncontrolled with recurrent hyperkalemia. Stop spironolactone and change to hydralazine , recheck in the next 2 weeks DASH diet and commitment to daily physical activity for a minimum of 30 minutes discussed and encouraged, as a part of hypertension management. The importance of attaining a healthy weight is also discussed.  BP/Weight 04/24/2018 01/02/2018 10/30/2017 10/30/2017 08/21/2017 06/01/2017 01/31/2017  Systolic BP 160 150 140 140 140 140 138  Diastolic BP 78 82 64 64 60 62 70  Wt. (Lbs) 146 147 142 142 144 140 145  BMI 21.56 21.71 20.97 20.97 21.27 20.67 21.41

## 2018-04-29 NOTE — Assessment & Plan Note (Signed)
Asked:confirms currently smokes cigarettes Assess: Unwilling to quit but cutting back Advise: needs to QUIT to reduce risk of cancer, cardio and cerebrovascular disease Assist: counseled for 5 minutes and literature provided Arrange: follow up in 3 months  

## 2018-05-03 ENCOUNTER — Telehealth: Payer: Self-pay | Admitting: Family Medicine

## 2018-05-03 ENCOUNTER — Other Ambulatory Visit: Payer: Self-pay

## 2018-05-03 ENCOUNTER — Ambulatory Visit (INDEPENDENT_AMBULATORY_CARE_PROVIDER_SITE_OTHER): Payer: PPO

## 2018-05-03 VITALS — BP 160/70

## 2018-05-03 DIAGNOSIS — D519 Vitamin B12 deficiency anemia, unspecified: Secondary | ICD-10-CM | POA: Diagnosis not present

## 2018-05-03 MED ORDER — CYANOCOBALAMIN 1000 MCG/ML IJ SOLN
1000.0000 ug | Freq: Once | INTRAMUSCULAR | Status: AC
  Administered 2018-05-03: 1000 ug via INTRAMUSCULAR

## 2018-05-03 MED ORDER — HYDRALAZINE HCL 50 MG PO TABS: ORAL_TABLET | ORAL | 4 refills | Status: DC

## 2018-05-03 NOTE — Progress Notes (Signed)
b12 injection given in the left deltoid with no complications.  Blood pressure rechecked by Dr Lodema Hong   Blood pressure is 150/70 (R) and 160/70 on left   Increase to 50 mg twice daily, OK to take two 25 mg tabs together twice daily till done  PLEASE take on a 12 hr apart schedule, eg 7 am and 7 pm

## 2018-05-03 NOTE — Patient Instructions (Signed)
Blood pressure is 150/70 (R) and 160/70 on left   Increase Hydralazine to 50 mg twice daily, OK to take two 25 mg tabs together twice daily till done  PLEASE take on a 12 hr apart schedule, example 7 am and 7 pm

## 2018-05-03 NOTE — Telephone Encounter (Signed)
Blood pressure is 150/70 (R) and 160/70 on left   Increase to 50 mg twice daily, tOK to take two 265 mg tabs together twice daily till done  PLEASE take omn a 12 hr apart schedule, eg 7 am and 7 pm  Please explain to pt  Thanks

## 2018-05-03 NOTE — Telephone Encounter (Signed)
Patient aware and understands. Med change information given on AVS

## 2018-05-07 ENCOUNTER — Ambulatory Visit: Payer: PPO

## 2018-05-07 ENCOUNTER — Other Ambulatory Visit: Payer: Self-pay | Admitting: Family Medicine

## 2018-05-28 ENCOUNTER — Telehealth: Payer: Self-pay | Admitting: Family Medicine

## 2018-05-28 NOTE — Telephone Encounter (Signed)
Patient called and wants to know how he will get his B12 shot.  Can you do a rx for is and he get it at the Pharmacy or can you meet him at the office and get the shot.  Please return his call and advise.

## 2018-05-31 NOTE — Telephone Encounter (Signed)
Called and asked him to come to the office Friday am to get his shot between 10-11 am. Will see if he can get a ride and call back to confirm

## 2018-06-01 ENCOUNTER — Ambulatory Visit (INDEPENDENT_AMBULATORY_CARE_PROVIDER_SITE_OTHER): Payer: PPO

## 2018-06-01 ENCOUNTER — Other Ambulatory Visit: Payer: Self-pay

## 2018-06-01 DIAGNOSIS — E538 Deficiency of other specified B group vitamins: Secondary | ICD-10-CM | POA: Diagnosis not present

## 2018-06-01 MED ORDER — CYANOCOBALAMIN 1000 MCG/ML IJ SOLN
1000.0000 ug | Freq: Once | INTRAMUSCULAR | Status: AC
  Administered 2018-06-01: 1000 ug via INTRAMUSCULAR

## 2018-06-11 ENCOUNTER — Other Ambulatory Visit: Payer: Self-pay | Admitting: Family Medicine

## 2018-06-28 ENCOUNTER — Other Ambulatory Visit: Payer: Self-pay | Admitting: Family Medicine

## 2018-06-28 ENCOUNTER — Ambulatory Visit: Payer: PPO

## 2018-07-03 ENCOUNTER — Ambulatory Visit: Payer: PPO | Admitting: Family Medicine

## 2018-07-04 ENCOUNTER — Other Ambulatory Visit: Payer: Self-pay

## 2018-07-04 ENCOUNTER — Ambulatory Visit (INDEPENDENT_AMBULATORY_CARE_PROVIDER_SITE_OTHER): Payer: PPO | Admitting: Family Medicine

## 2018-07-04 ENCOUNTER — Encounter: Payer: Self-pay | Admitting: Family Medicine

## 2018-07-04 VITALS — BP 140/70 | HR 76 | Temp 98.0°F | Resp 12 | Ht 69.0 in | Wt 148.0 lb

## 2018-07-04 DIAGNOSIS — N521 Erectile dysfunction due to diseases classified elsewhere: Secondary | ICD-10-CM | POA: Diagnosis not present

## 2018-07-04 DIAGNOSIS — F17200 Nicotine dependence, unspecified, uncomplicated: Secondary | ICD-10-CM | POA: Diagnosis not present

## 2018-07-04 DIAGNOSIS — E1140 Type 2 diabetes mellitus with diabetic neuropathy, unspecified: Secondary | ICD-10-CM | POA: Diagnosis not present

## 2018-07-04 DIAGNOSIS — I150 Renovascular hypertension: Secondary | ICD-10-CM

## 2018-07-04 DIAGNOSIS — D519 Vitamin B12 deficiency anemia, unspecified: Secondary | ICD-10-CM

## 2018-07-04 MED ORDER — CYANOCOBALAMIN 1000 MCG/ML IJ SOLN
1000.0000 ug | Freq: Once | INTRAMUSCULAR | Status: AC
  Administered 2018-07-04: 1000 ug via INTRAMUSCULAR

## 2018-07-04 NOTE — Assessment & Plan Note (Signed)
Donald Everett is reminded of the importance of commitment to daily physical activity for 30 minutes or more, as able and the need to limit carbohydrate intake to 30 to 60 grams per meal to help with blood sugar control.   The need to take medication as prescribed, test blood sugar as directed, and to call between visits if there is a concern that blood sugar is uncontrolled is also discussed.   Donald Everett is reminded of the importance of daily foot exam, annual eye examination, and good blood sugar, blood pressure and cholesterol control.  Diabetic Labs Latest Ref Rng & Units 04/24/2018 04/19/2018 12/25/2017 08/15/2017 03/23/2017  HbA1c <5.7 % of total Hgb - 7.2(H) 7.1(H) 6.9(H) 7.5(H)  Microalbumin mg/dL - - 3.1 - -  Micro/Creat Ratio <30 mcg/mg creat - - 70(H) - -  Chol <200 mg/dL - 594 - 585 -  HDL > OR = 40 mg/dL - 92(T) - 24(M) -  Calc LDL mg/dL (calc) - 63 - 57 -  Triglycerides <150 mg/dL - 90 - 85 -  Creatinine 0.70 - 1.18 mg/dL 6.28(M) 3.81(R) 7.11(A) 1.48(H) 1.32(H)   BP/Weight 07/04/2018 05/03/2018 04/24/2018 01/02/2018 10/30/2017 10/30/2017 08/21/2017  Systolic BP 140 160 160 150 140 140 140  Diastolic BP 70 70 78 82 64 64 60  Wt. (Lbs) 148 - 146 147 142 142 144  BMI 21.86 - 21.56 21.71 20.97 20.97 21.27   Foot/eye exam completion dates Latest Ref Rng & Units 04/23/2018 06/01/2017  Eye Exam No Retinopathy No Retinopathy -  Foot Form Completion - - Done   Controlled, no change in medication Updated lab needed at/ before next visit.

## 2018-07-04 NOTE — Assessment & Plan Note (Signed)
1 cc IM administered

## 2018-07-04 NOTE — Patient Instructions (Signed)
F/U as before, call if you need  Me before   No change in medication as blood pressure is improved. Please aim to get to 16/ ciggs/ day by the time you get back  ,Social distancing.and stay 6 ft away Frequent hand washing with soap and water Keeping your hands off of your face.wear a mask These 3 practices will help to keep both you and your community healthy during this time. Please practice them faithfully!

## 2018-07-04 NOTE — Assessment & Plan Note (Signed)
Improved, continus same dose of medication DASH diet and commitment to daily physical activity for a minimum of 30 minutes discussed and encouraged, as a part of hypertension management. The importance of attaining a healthy weight is also discussed.  BP/Weight 07/04/2018 05/03/2018 04/24/2018 01/02/2018 10/30/2017 10/30/2017 08/21/2017  Systolic BP 140 160 160 150 140 140 140  Diastolic BP 70 70 78 82 64 64 60  Wt. (Lbs) 148 - 146 147 142 142 144  BMI 21.86 - 21.56 21.71 20.97 20.97 21.27

## 2018-07-04 NOTE — Progress Notes (Signed)
[facility]     MRN: [id number]      DOB: 11/10/1940   HPI Donald Everett is here for follow up and re-evaluation of uncontrolled hypertension and to obtain his monthly B12 injection Generally doing well. Still smokes 18/day, trying to cut back with a view to eventually quitting Denies polyuria, polydipsia, blurred vision , or hypoglycemic episodes.  ROS Denies recent fever or chills. Denies sinus pressure, nasal congestion, ear pain or sore throat. Denies chest congestion, productive cough or wheezing. Denies chest pains, palpitations and leg swelling Denies abdominal pain, nausea, vomiting,diarrhea or constipation.   Denies dysuria, frequency, hesitancy or incontinence. Denies uncontrolled  joint pain, swelling and limitation in mobility. Denies headaches, seizures, Denies skin break down or rash.   PE  BP 140/70   Pulse 76   Temp 98 F (36.7 C) (Oral)   Resp 12   Ht 5\' 9"  (1.753 m)   Wt 148 lb (67.1 kg)   SpO2 95%   BMI 21.86 kg/m   Patient alert and oriented and in no cardiopulmonary distress.  HEENT: No facial asymmetry, EOMI,   oropharynx pink and moist.  Neck supple no JVD, no mass.  Chest: Clear to auscultation bilaterally.Decreased air entry  CVS: S1, S2 no murmurs, no S3.Regular rate.  ABD: Soft non tender.   Ext: No edema  MS: decreased  ROM spine, shoulders, hips and knees.  Skin: Intact, no ulcerations or rash noted.  Psych: Good eye contact, normal affect. Memory intact not anxious or depressed appearing.  CNS: CN 2-12 intact, power,  normal throughout.no focal deficits noted.   Assessment & Plan  B12 deficiency anemia 1 cc IM administered  NICOTINE ADDICTION Asked:confirms currently smokes 18 cigarettes/ day Assess: Unwilling to quit but cutting back Advise: needs to QUIT to reduce risk of cancer, cardio and cerebrovascular disease Assist: counseled for 5 minutes and literature provided Arrange: follow up in 3 months   Renovascular  hypertension Improved, continus same dose of medication DASH diet and commitment to daily physical activity for a minimum of 30 minutes discussed and encouraged, as a part of hypertension management. The importance of attaining a healthy weight is also discussed.  BP/Weight 07/04/2018 05/03/2018 04/24/2018 01/02/2018 10/30/2017 10/30/2017 08/21/2017  Systolic BP 140 160 160 150 140 140 140  Diastolic BP 70 70 78 82 64 64 60  Wt. 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   The need to take medication as prescribed, test blood sugar as directed, and to call between visits if there is a concern that blood sugar is uncontrolled is also discussed.   Donald Everett is reminded of the importance of daily foot exam, annual eye examination, and good blood sugar, blood pressure and cholesterol control.  Diabetic Labs Latest Ref Rng & Units 04/24/2018 04/19/2018 12/25/2017 08/15/2017 03/23/2017  HbA1c <5.7 % of total Hgb - 7.2(H) 7.1(H) 6.9(H) 7.5(H)  Microalbumin mg/dL - - 3.1 - -  Micro/Creat Ratio <30 mcg/mg creat - - 70(H) - -  Chol <200 mg/dL - 007 - 622 -  HDL > OR = 40 mg/dL - 63(F) - 35(K) -  Calc LDL mg/dL (calc) - 63 - 57 -  Triglycerides <150 mg/dL - 90 - 85 -  Creatinine 0.70 - 1.18 mg/dL 1.61(W1.42(H) 9.60(A1.57(H) 5.40(J1.31(H) 1.48(H) 1.32(H)   BP/Weight 07/04/2018 05/03/2018 04/24/2018 01/02/2018 10/30/2017 10/30/2017 08/21/2017  Systolic BP 140 160 160 150 140 140 140  Diastolic BP 70 70 78 82 64 64 60  Wt. (Lbs) 148 - 146 147 142 142 144  BMI 21.86 - 21.56 21.71 20.97 20.97 21.27   Foot/eye exam completion dates Latest Ref Rng & Units 04/23/2018 06/01/2017  Eye Exam No Retinopathy No Retinopathy -   Foot Form Completion - - Done   Controlled, no change in medication Updated lab needed at/ before next visit.

## 2018-07-04 NOTE — Assessment & Plan Note (Signed)
Asked:confirms currently smokes 18 cigarettes/ day Assess: Unwilling to quit but cutting back Advise: needs to QUIT to reduce risk of cancer, cardio and cerebrovascular disease Assist: counseled for 5 minutes and literature provided Arrange: follow up in 3 months

## 2018-07-30 ENCOUNTER — Ambulatory Visit: Payer: PPO | Admitting: Family Medicine

## 2018-07-31 ENCOUNTER — Ambulatory Visit (INDEPENDENT_AMBULATORY_CARE_PROVIDER_SITE_OTHER): Payer: PPO | Admitting: Family Medicine

## 2018-07-31 ENCOUNTER — Encounter: Payer: Self-pay | Admitting: Family Medicine

## 2018-07-31 ENCOUNTER — Other Ambulatory Visit: Payer: Self-pay

## 2018-07-31 VITALS — BP 140/70 | Ht 69.0 in | Wt 148.0 lb

## 2018-07-31 DIAGNOSIS — N521 Erectile dysfunction due to diseases classified elsewhere: Secondary | ICD-10-CM | POA: Diagnosis not present

## 2018-07-31 DIAGNOSIS — E785 Hyperlipidemia, unspecified: Secondary | ICD-10-CM | POA: Diagnosis not present

## 2018-07-31 DIAGNOSIS — F17200 Nicotine dependence, unspecified, uncomplicated: Secondary | ICD-10-CM | POA: Diagnosis not present

## 2018-07-31 DIAGNOSIS — F411 Generalized anxiety disorder: Secondary | ICD-10-CM | POA: Diagnosis not present

## 2018-07-31 DIAGNOSIS — G8929 Other chronic pain: Secondary | ICD-10-CM

## 2018-07-31 DIAGNOSIS — I150 Renovascular hypertension: Secondary | ICD-10-CM

## 2018-07-31 DIAGNOSIS — E1140 Type 2 diabetes mellitus with diabetic neuropathy, unspecified: Secondary | ICD-10-CM

## 2018-07-31 NOTE — Progress Notes (Signed)
Virtual Visit via Telephone Note  I connected with Donald Everett on 07/31/18 at  8:40 AM EDT by telephone and verified that I am speaking with the correct person using two identifiers.  Location: Patient: home Provider: office   I discussed the limitations, risks, security and privacy concerns of performing an evaluation and management service by telephone and the availability of in person appointments. I also discussed with the patient that there may be a patient responsible charge related to this service. The patient expressed understanding and agreed to proceed. This visit type is conducted due to national recommendations for restrictions regarding the COVID -19 Pandemic. Due to the patient's age and / or co morbidities, this format is felt to be most appropriate at this time without adequate follow up. The patient has no access to video technology/ had technical difficulties with video, requiring transitioning to audio format  only ( telephone ). All issues noted this document were discussed and addressed,no physical exam can be performed in this format.    History of Present Illness: F/u chronic problems, review recent labs and medications Review adequacy of pain management Denies recent fever or chills. Denies sinus pressure, nasal congestion, ear pain or sore throat. Denies chest congestion, productive cough or wheezing. Denies chest pains, palpitations and leg swelling Denies abdominal pain, nausea, vomiting,diarrhea or constipation.   Denies dysuria, frequency, hesitancy or incontinence. Denies  Uncontrolled  joint pain, swelling and limitation in mobility. Denies headaches, seizures, numbness, or tingling. Denies depression, anxiety or insomnia.Medication management is effective Denies skin break down or rash. Denies polyuria, polydipsia, blurred vision ,c/o low morning blood sugars      Observations/Objective: BP 140/70   Ht 5\' 9"  (1.753 m)   Wt 148 lb (67.1 kg)    BMI 21.86 kg/m  Good communication with no confusion and intact memory. 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 is reminded of the importance of commitment to daily physical activity for 30 minutes or more, as able and the need to limit carbohydrate intake to 30 to 60 grams per meal to help with blood sugar control.   The need to take medication as prescribed, test blood sugar as directed, and to call between visits if there is a concern that blood sugar is uncontrolled is also discussed.   [name] is reminded of the importance Over corrected medication reduction advised.  of daily foot exam, annual eye examination, and good blood sugar, blood pressure and cholesterol control.  Diabetic Labs Latest Ref Rng & Units 08/02/2018 04/24/2018 04/19/2018 12/25/2017 08/15/2017  HbA1c <5.7 % of total Hgb 6.4(H) - 7.2(H) 7.1(H) 6.9(H)  Microalbumin mg/dL - - - 3.1 -  Micro/Creat Ratio <30 mcg/mg creat - - - 70(H) -  Chol <200 mg/dL - - 409108 - 811103  HDL > OR = 40 mg/dL - - 91(Y28(L) - 78(G29(L)  Calc LDL mg/dL (calc) - - 63 - 57  Triglycerides <150 mg/dL - - 90 - 85  Creatinine 0.70 - 1.18 mg/dL 9.56(O1.42(H) 1.30(Q1.42(H) 6.57(Q1.57(H) 1.31(H) 1.48(H)   BP/Weight 07/31/2018 07/04/2018 05/03/2018 04/24/2018 01/02/2018 10/30/2017 10/30/2017  Systolic BP 140 140 160 160 150 140 140  Diastolic BP 70 70 70 78 82 64 64  Wt. (Lbs) 148 148 - 146 147 142 142  BMI 21.86 21.86 - 21.56 21.71 20.97 20.97   Foot/eye exam completion dates Latest Ref Rng & Units  04/23/2018 06/01/2017  Eye Exam No Retinopathy No Retinopathy -  Foot Form Completion - - Done        Hyperlipidemia LDL goal <100 Hyperlipidemia:Low fat diet discussed and encouraged.   Lipid Panel  Lab Results  Component Value Date   CHOL 108 04/19/2018   HDL 28 (L) 04/19/2018   LDLCALC 63 04/19/2018   TRIG 90 04/19/2018   CHOLHDL 3.9  04/19/2018     Needs to increase exercise as able  Renovascular hypertension Adequate though sub optimal control, no med change DASH diet and commitment to daily physical activity for a minimum of 30 minutes discussed and encouraged, as a part of hypertension management. The importance of attaining a healthy weight is also discussed.  BP/Weight 07/31/2018 07/04/2018 05/03/2018 04/24/2018 01/02/2018 10/30/2017 9/83/3825  Systolic BP 053 976 734 193 790 240 973  Diastolic BP 70 70 70 78 82 64 64  Wt. (Lbs) 148 148 - 146 147 142 142  BMI 21.86 21.86 - 21.56 21.71 20.97 20.97       NICOTINE ADDICTION Asked:confirms currently smokes 15 to 20  Cigarettes/ day Assess: Unwilling to quit but cutting back Advise: needs to QUIT to reduce risk of cancer, cardio and cerebrovascular disease Assist: counseled for 5 minutes and literature provided Arrange: follow up in 3 months   Encounter for chronic pain management The patient's Controlled Substance registry is reviewed and compliance confirmed. Adequacy of  Pain control and level of function is assessed. Medication dosing is adjusted as deemed appropriate. Twelve weeks of medication is prescribed , patient signs for the script and is provided with a follow up appointment between 11 to 12 weeks .   GENERALIZED ANXIETY DISORDER Controlled, no change in medication     Follow Up Instructions:    I discussed the assessment and treatment plan with the patient. The patient was provided an opportunity to ask questions and all were answered. The patient agreed with the plan and demonstrated an understanding of the instructions.   The patient was advised to call back or seek an in-person evaluation if the symptoms worsen or if the condition fails to improve as anticipated.  I provided 28 minutes of non-face-to-face time during this encounter.   Tula Nakayama, MD

## 2018-07-31 NOTE — Patient Instructions (Addendum)
Annual physical exam with MD and pain management  in 13 weeks, call if you need me before  Please reduce lantus to 5 units , since your morning sugars are low  Please get labs ordered in the month of July, you may eat breakfast, they are non fasting. Going to the lab early, near 7 am , reduces the time youn will need to wait  Please do continue to work on cutting back cigarettes to improve your health , and prolong your life!  Thankful you are doing well  Npo additional changes in medication at this time

## 2018-08-02 ENCOUNTER — Ambulatory Visit (INDEPENDENT_AMBULATORY_CARE_PROVIDER_SITE_OTHER): Payer: PPO

## 2018-08-02 DIAGNOSIS — D519 Vitamin B12 deficiency anemia, unspecified: Secondary | ICD-10-CM | POA: Diagnosis not present

## 2018-08-02 DIAGNOSIS — I150 Renovascular hypertension: Secondary | ICD-10-CM | POA: Diagnosis not present

## 2018-08-02 DIAGNOSIS — E119 Type 2 diabetes mellitus without complications: Secondary | ICD-10-CM | POA: Diagnosis not present

## 2018-08-02 DIAGNOSIS — Z794 Long term (current) use of insulin: Secondary | ICD-10-CM | POA: Diagnosis not present

## 2018-08-02 MED ORDER — CYANOCOBALAMIN 1000 MCG/ML IJ SOLN
1000.0000 ug | Freq: Once | INTRAMUSCULAR | Status: AC
  Administered 2018-08-02: 1000 ug via INTRAMUSCULAR

## 2018-08-02 NOTE — Progress Notes (Signed)
b12 given in right deltoid with no complications

## 2018-08-03 LAB — HEMOGLOBIN A1C
Hgb A1c MFr Bld: 6.4 % of total Hgb — ABNORMAL HIGH (ref <5.7)
Mean Plasma Glucose: 137 (calc)
eAG (mmol/L): 7.6 (calc)

## 2018-08-03 LAB — BASIC METABOLIC PANEL WITH GFR
BUN/Creatinine Ratio: 17 (calc) (ref 6–22)
BUN: 24 mg/dL (ref 7–25)
CO2: 24 mmol/L (ref 20–32)
Calcium: 8.6 mg/dL (ref 8.6–10.3)
Chloride: 105 mmol/L (ref 98–110)
Creat: 1.42 mg/dL — ABNORMAL HIGH (ref 0.70–1.18)
GFR, Est African American: 55 mL/min/{1.73_m2} — ABNORMAL LOW (ref >=60)
GFR, Est Non African American: 47 mL/min/{1.73_m2} — ABNORMAL LOW (ref >=60)
Glucose, Bld: 206 mg/dL — ABNORMAL HIGH (ref 65–139)
Potassium: 4.9 mmol/L (ref 3.5–5.3)
Sodium: 135 mmol/L (ref 135–146)

## 2018-08-05 ENCOUNTER — Encounter: Payer: Self-pay | Admitting: Family Medicine

## 2018-08-05 MED ORDER — HYDROCODONE-ACETAMINOPHEN 7.5-325 MG PO TABS: ORAL_TABLET | ORAL | 0 refills | Status: AC

## 2018-08-05 NOTE — Assessment & Plan Note (Signed)
Controlled, no change in medication  

## 2018-08-05 NOTE — Assessment & Plan Note (Signed)
Hyperlipidemia:Low fat diet discussed and encouraged.   Lipid Panel  Lab Results  Component Value Date   CHOL 108 04/19/2018   HDL 28 (L) 04/19/2018   LDLCALC 63 04/19/2018   TRIG 90 04/19/2018   CHOLHDL 3.9 04/19/2018     Needs to increase exercise as able

## 2018-08-05 NOTE — Assessment & Plan Note (Signed)
Asked:confirms currently smokes 15 to 85  Cigarettes/ day Assess: Unwilling to quit but cutting back Advise: needs to QUIT to reduce risk of cancer, cardio and cerebrovascular disease Assist: counseled for 5 minutes and literature provided Arrange: follow up in 3 months

## 2018-08-05 NOTE — Assessment & Plan Note (Signed)
Adequate though sub optimal control, no med change DASH diet and commitment to daily physical activity for a minimum of 30 minutes discussed and encouraged, as a part of hypertension management. The importance of attaining a healthy weight is also discussed.  BP/Weight 07/31/2018 07/04/2018 05/03/2018 04/24/2018 01/02/2018 10/30/2017 2/50/5397  Systolic BP 673 419 379 024 097 353 299  Diastolic BP 70 70 70 78 82 64 64  Wt. (Lbs) 148 148 - 146 147 142 142  BMI 21.86 21.86 - 21.56 21.71 20.97 20.97

## 2018-08-05 NOTE — Assessment & Plan Note (Signed)
[name] is reminded of the importance of commitment to daily physical activity for 30 minutes or more, as able and the need to limit carbohydrate intake to 30 to 60 grams per meal to help with blood sugar control.   The need to take medication as prescribed, test blood sugar as directed, and to call between visits if there is a concern that blood sugar is uncontrolled is also discussed.   Donald Everett is reminded of the importance Over corrected medication reduction advised.  of daily foot exam, annual eye examination, and good blood sugar, blood pressure and cholesterol control.  Diabetic Labs Latest Ref Rng & Units 08/02/2018 04/24/2018 04/19/2018 12/25/2017 08/15/2017  HbA1c <5.7 % of total Hgb 6.4(H) - 7.2(H) 7.1(H) 6.9(H)  Microalbumin mg/dL - - - 3.1 -  Micro/Creat Ratio <30 mcg/mg creat - - - 70(H) -  Chol <200 mg/dL - - 108 - 103  HDL > OR = 40 mg/dL - - 28(L) - 29(L)  Calc LDL mg/dL (calc) - - 63 - 57  Triglycerides <150 mg/dL - - 90 - 85  Creatinine 0.70 - 1.18 mg/dL 1.42(H) 1.42(H) 1.57(H) 1.31(H) 1.48(H)   BP/Weight 07/31/2018 07/04/2018 05/03/2018 04/24/2018 01/02/2018 10/30/2017 5/53/7482  Systolic BP 707 867 544 920 100 712 197  Diastolic BP 70 70 70 78 82 64 64  Wt. (Lbs) 148 148 - 146 147 142 142  BMI 21.86 21.86 - 21.56 21.71 20.97 20.97   Foot/eye exam completion dates Latest Ref Rng & Units 04/23/2018 06/01/2017  Eye Exam No Retinopathy No Retinopathy -  Foot Form Completion - - Done

## 2018-08-05 NOTE — Assessment & Plan Note (Signed)
The patient's Controlled Substance registry is reviewed and compliance confirmed. Adequacy of  Pain control and level of function is assessed. Medication dosing is adjusted as deemed appropriate. Twelve weeks of medication is prescribed , patient signs for the script and is provided with a follow up appointment between 11 to 12 weeks .  

## 2018-08-07 ENCOUNTER — Ambulatory Visit: Payer: PPO | Admitting: Family Medicine

## 2018-08-30 ENCOUNTER — Ambulatory Visit (INDEPENDENT_AMBULATORY_CARE_PROVIDER_SITE_OTHER): Payer: PPO

## 2018-08-30 ENCOUNTER — Other Ambulatory Visit: Payer: Self-pay

## 2018-08-30 DIAGNOSIS — D519 Vitamin B12 deficiency anemia, unspecified: Secondary | ICD-10-CM | POA: Diagnosis not present

## 2018-08-31 DIAGNOSIS — D519 Vitamin B12 deficiency anemia, unspecified: Secondary | ICD-10-CM

## 2018-08-31 MED ORDER — CYANOCOBALAMIN 1000 MCG/ML IJ SOLN
1000.0000 ug | Freq: Once | INTRAMUSCULAR | Status: AC
  Administered 2018-08-31: 1000 ug via INTRAMUSCULAR

## 2018-08-31 NOTE — Progress Notes (Signed)
Injection given no complications. Return in 1 month

## 2018-10-01 ENCOUNTER — Other Ambulatory Visit: Payer: Self-pay

## 2018-10-01 ENCOUNTER — Ambulatory Visit (INDEPENDENT_AMBULATORY_CARE_PROVIDER_SITE_OTHER): Payer: PPO

## 2018-10-01 DIAGNOSIS — D519 Vitamin B12 deficiency anemia, unspecified: Secondary | ICD-10-CM

## 2018-10-01 MED ORDER — CYANOCOBALAMIN 1000 MCG/ML IJ SOLN
1000.0000 ug | Freq: Once | INTRAMUSCULAR | Status: AC
  Administered 2018-10-01: 1000 ug via INTRAMUSCULAR

## 2018-10-01 NOTE — Progress Notes (Signed)
In to get monthly b12 injection

## 2018-10-19 ENCOUNTER — Other Ambulatory Visit: Payer: Self-pay | Admitting: Family Medicine

## 2018-10-24 ENCOUNTER — Telehealth: Payer: Self-pay | Admitting: *Deleted

## 2018-10-24 ENCOUNTER — Other Ambulatory Visit: Payer: Self-pay

## 2018-10-24 MED ORDER — HYDRALAZINE HCL 50 MG PO TABS: ORAL_TABLET | ORAL | 1 refills | Status: DC

## 2018-10-24 MED ORDER — LOSARTAN POTASSIUM 100 MG PO TABS: 100.0000 mg | ORAL_TABLET | Freq: Every day | ORAL | 1 refills | Status: DC

## 2018-10-24 MED ORDER — AMLODIPINE BESYLATE 10 MG PO TABS: 10.0000 mg | ORAL_TABLET | Freq: Every day | ORAL | 1 refills | Status: DC

## 2018-10-24 NOTE — Telephone Encounter (Signed)
blood pressure med faxed

## 2018-10-24 NOTE — Telephone Encounter (Signed)
Pt called said he is completely out of his blood pressure medication and needs those sent to [facility].

## 2018-11-01 ENCOUNTER — Other Ambulatory Visit: Payer: Self-pay

## 2018-11-01 ENCOUNTER — Encounter (INDEPENDENT_AMBULATORY_CARE_PROVIDER_SITE_OTHER): Payer: Self-pay

## 2018-11-01 ENCOUNTER — Encounter: Payer: Self-pay | Admitting: Family Medicine

## 2018-11-01 ENCOUNTER — Ambulatory Visit: Payer: PPO

## 2018-11-01 ENCOUNTER — Ambulatory Visit (INDEPENDENT_AMBULATORY_CARE_PROVIDER_SITE_OTHER): Payer: PPO | Admitting: Family Medicine

## 2018-11-01 VITALS — BP 160/70 | HR 76 | Temp 97.8°F | Resp 15 | Ht 69.0 in | Wt 149.0 lb

## 2018-11-01 DIAGNOSIS — E119 Type 2 diabetes mellitus without complications: Secondary | ICD-10-CM

## 2018-11-01 DIAGNOSIS — Z794 Long term (current) use of insulin: Secondary | ICD-10-CM

## 2018-11-01 DIAGNOSIS — Z23 Encounter for immunization: Secondary | ICD-10-CM | POA: Diagnosis not present

## 2018-11-01 DIAGNOSIS — E538 Deficiency of other specified B group vitamins: Secondary | ICD-10-CM

## 2018-11-01 DIAGNOSIS — Z0001 Encounter for general adult medical examination with abnormal findings: Secondary | ICD-10-CM

## 2018-11-01 DIAGNOSIS — E559 Vitamin D deficiency, unspecified: Secondary | ICD-10-CM

## 2018-11-01 DIAGNOSIS — N521 Erectile dysfunction due to diseases classified elsewhere: Secondary | ICD-10-CM

## 2018-11-01 DIAGNOSIS — G8929 Other chronic pain: Secondary | ICD-10-CM | POA: Diagnosis not present

## 2018-11-01 DIAGNOSIS — E785 Hyperlipidemia, unspecified: Secondary | ICD-10-CM

## 2018-11-01 DIAGNOSIS — E1140 Type 2 diabetes mellitus with diabetic neuropathy, unspecified: Secondary | ICD-10-CM

## 2018-11-01 DIAGNOSIS — Z0000 Encounter for general adult medical examination without abnormal findings: Secondary | ICD-10-CM

## 2018-11-01 DIAGNOSIS — D519 Vitamin B12 deficiency anemia, unspecified: Secondary | ICD-10-CM

## 2018-11-01 DIAGNOSIS — IMO0001 Reserved for inherently not codable concepts without codable children: Secondary | ICD-10-CM

## 2018-11-01 MED ORDER — ONETOUCH ULTRA 2 W/DEVICE KIT: PACK | 0 refills | Status: AC

## 2018-11-01 MED ORDER — CYANOCOBALAMIN 1000 MCG/ML IJ SOLN
1000.0000 ug | Freq: Once | INTRAMUSCULAR | Status: AC
  Administered 2018-11-01: 1000 ug via INTRAMUSCULAR

## 2018-11-01 NOTE — Patient Instructions (Addendum)
F/U in 12 weeks, call if you need me before  B12 and flu vaccine today.  No medication changes  Fasting lipid, cmp and EGFR, HBA1C, TSh, and vit D in the next 1 to 4 weeks, please    Aim for 15 ciggs/ day  Thanks for choosing [facility], we consider it a privelige to serve you.  

## 2018-11-02 ENCOUNTER — Encounter: Payer: Self-pay | Admitting: Family Medicine

## 2018-11-02 ENCOUNTER — Other Ambulatory Visit: Payer: Self-pay

## 2018-11-02 ENCOUNTER — Other Ambulatory Visit: Payer: Self-pay | Admitting: Family Medicine

## 2018-11-02 ENCOUNTER — Ambulatory Visit: Payer: PPO | Admitting: Family Medicine

## 2018-11-02 ENCOUNTER — Ambulatory Visit: Payer: PPO

## 2018-11-02 ENCOUNTER — Ambulatory Visit (INDEPENDENT_AMBULATORY_CARE_PROVIDER_SITE_OTHER): Payer: PPO | Admitting: Family Medicine

## 2018-11-02 VITALS — BP 160/70 | HR 76 | Resp 15 | Ht 69.0 in | Wt 149.0 lb

## 2018-11-02 DIAGNOSIS — Z0000 Encounter for general adult medical examination without abnormal findings: Secondary | ICD-10-CM | POA: Diagnosis not present

## 2018-11-02 NOTE — Patient Instructions (Signed)
Donald Everett , Thank you for taking time to come for your Medicare Wellness Visit. I appreciate your ongoing commitment to your health goals. Please review the following plan we discussed and let me know if I can assist you in the future.   Please continue to practice social distancing to keep you, your family, and our community safe.  If you must go out, please wear a Mask and practice good handwashing.   Screening recommendations/referrals: Colonoscopy: Due 2027 Recommended yearly ophthalmology/optometry visit for glaucoma screening and checkup Recommended yearly dental visit for hygiene and checkup  Vaccinations: Influenza vaccine: Completed Pneumococcal vaccine: Completed Tdap vaccine: Due 2026 Shingles vaccine: Completed  Advanced directives: You declined paperwork today.   Conditions/risks identified: Falls  Next appointment: 01/17/2019  Preventive Care 78 Years and Older, Male Preventive care refers to lifestyle choices and visits with your health care provider that can promote health and wellness. What does preventive care include?  A yearly physical exam. This is also called an annual well check.  Dental exams once or twice a year.  Routine eye exams. Ask your health care provider how often you should have your eyes checked.  Personal lifestyle choices, including:  Daily care of your teeth and gums.  Regular physical activity.  Eating a healthy diet.  Avoiding tobacco and drug use.  Limiting alcohol use.  Practicing safe sex.  Taking low doses of aspirin every day.  Taking vitamin and mineral supplements as recommended by your health care provider. What happens during an annual well check? The services and screenings done by your health care provider during your annual well check will depend on your age, overall health, lifestyle risk factors, and family history of disease. Counseling  Your health care provider may ask you questions about your:  Alcohol  use.  Tobacco use.  Drug use.  Emotional well-being.  Home and relationship well-being.  Sexual activity.  Eating habits.  History of falls.  Memory and ability to understand (cognition).  Work and work Statistician. Screening  You may have the following tests or measurements:  Height, weight, and BMI.  Blood pressure.  Lipid and cholesterol levels. These may be checked every 5 years, or more frequently if you are over 73 years old.  Skin check.  Lung cancer screening. You may have this screening every year starting at age 76 if you have a 30-pack-year history of smoking and currently smoke or have quit within the past 15 years.  Fecal occult blood test (FOBT) of the stool. You may have this test every year starting at age 36.  Flexible sigmoidoscopy or colonoscopy. You may have a sigmoidoscopy every 5 years or a colonoscopy every 10 years starting at age 45.  Prostate cancer screening. Recommendations will vary depending on your family history and other risks.  Hepatitis C blood test.  Hepatitis B blood test.  Sexually transmitted disease (STD) testing.  Diabetes screening. This is done by checking your blood sugar (glucose) after you have not eaten for a while (fasting). You may have this done every 1-3 years.  Abdominal aortic aneurysm (AAA) screening. You may need this if you are a current or former smoker.  Osteoporosis. You may be screened starting at age 80 if you are at high risk. Talk with your health care provider about your test results, treatment options, and if necessary, the need for more tests. Vaccines  Your health care provider may recommend certain vaccines, such as:  Influenza vaccine. This is recommended every year.  Tetanus, diphtheria, and acellular pertussis (Tdap, Td) vaccine. You may need a Td booster every 10 years.  Zoster vaccine. You may need this after age 31.  Pneumococcal 13-valent conjugate (PCV13) vaccine. One dose is  recommended after age 15.  Pneumococcal polysaccharide (PPSV23) vaccine. One dose is recommended after age 79. Talk to your health care provider about which screenings and vaccines you need and how often you need them. This information is not intended to replace advice given to you by your health care provider. Make sure you discuss any questions you have with your health care provider. Document Released: 02/27/2015 Document Revised: 10/21/2015 Document Reviewed: 12/02/2014 Elsevier Interactive Patient Education  2017 [place] Prevention in the Home Falls can cause injuries. They can happen to people of all ages. There are many things you can do to make your home safe and to help prevent falls. What can I do on the outside of my home?  Regularly fix the edges of walkways and driveways and fix any cracks.  Remove anything that might make you trip as you walk through a door, such as a raised step or threshold.  Trim any bushes or trees on the path to your home.  Use bright outdoor lighting.  Clear any walking paths of anything that might make someone trip, such as rocks or tools.  Regularly check to see if handrails are loose or broken. Make sure that both sides of any steps have handrails.  Any raised decks and porches should have guardrails on the edges.  Have any leaves, snow, or ice cleared regularly.  Use sand or salt on walking paths during winter.  Clean up any spills in your garage right away. This includes oil or grease spills. What can I do in the bathroom?  Use night lights.  Install grab bars by the toilet and in the tub and shower. Do not use towel bars as grab bars.  Use non-skid mats or decals in the tub or shower.  If you need to sit down in the shower, use a plastic, non-slip stool.  Keep the floor dry. Clean up any water that spills on the floor as soon as it happens.  Remove soap buildup in the tub or shower regularly.  Attach bath mats  securely with double-sided non-slip rug tape.  Do not have throw rugs and other things on the floor that can make you trip. What can I do in the bedroom?  Use night lights.  Make sure that you have a light by your bed that is easy to reach.  Do not use any sheets or blankets that are too big for your bed. They should not hang down onto the floor.  Have a firm chair that has side arms. You can use this for support while you get dressed.  Do not have throw rugs and other things on the floor that can make you trip. What can I do in the kitchen?  Clean up any spills right away.  Avoid walking on wet floors.  Keep items that you use a lot in easy-to-reach places.  If you need to reach something above you, use a strong step stool that has a grab bar.  Keep electrical cords out of the way.  Do not use floor polish or wax that makes floors slippery. If you must use wax, use non-skid floor wax.  Do not have throw rugs and other things on the floor that can make you trip. What can I do  with my stairs?  Do not leave any items on the stairs.  Make sure that there are handrails on both sides of the stairs and use them. Fix handrails that are broken or loose. Make sure that handrails are as long as the stairways.  Check any carpeting to make sure that it is firmly attached to the stairs. Fix any carpet that is loose or worn.  Avoid having throw rugs at the top or bottom of the stairs. If you do have throw rugs, attach them to the floor with carpet tape.  Make sure that you have a light switch at the top of the stairs and the bottom of the stairs. If you do not have them, ask someone to add them for you. What else can I do to help prevent falls?  Wear shoes that:  Do not have high heels.  Have rubber bottoms.  Are comfortable and fit you well.  Are closed at the toe. Do not wear sandals.  If you use a stepladder:  Make sure that it is fully opened. Do not climb a closed  stepladder.  Make sure that both sides of the stepladder are locked into place.  Ask someone to hold it for you, if possible.  Clearly mark and make sure that you can see:  Any grab bars or handrails.  First and last steps.  Where the edge of each step is.  Use tools that help you move around (mobility aids) if they are needed. These include:  Canes.  Walkers.  Scooters.  Crutches.  Turn on the lights when you go into a dark area. Replace any light bulbs as soon as they burn out.  Set up your furniture so you have a clear path. Avoid moving your furniture around.  If any of your floors are uneven, fix them.  If there are any pets around you, be aware of where they are.  Review your medicines with your doctor. Some medicines can make you feel dizzy. This can increase your chance of falling. Ask your doctor what other things that you can do to help prevent falls. This information is not intended to replace advice given to you by your health care provider. Make sure you discuss any questions you have with your health care provider. Document Released: 11/27/2008 Document Revised: 07/09/2015 Document Reviewed: 03/07/2014 Elsevier Interactive Patient Education  2017 [employer].

## 2018-11-02 NOTE — Progress Notes (Signed)
Subjective:   Donald Everett is a 78 y.o. male who presents for Medicare Annual/Subsequent preventive examination.  Location of Patient: Home Location of Provider: Telehealth Consent was obtain for visit to be over via telehealth. I verified that I am speaking with the correct person using two identifiers.    Review of Systems:    Cardiac Risk Factors include: advanced age (>40mn, >>12women);male gender;diabetes mellitus;dyslipidemia;hypertension     Objective:    Vitals: BP (!) 160/70   Pulse 76   Resp 15   Ht '5\' 9"'$  (1.753 m)   Wt 149 lb (67.6 kg)   BMI 22.00 kg/m   Body mass index is 22 kg/m.  Advanced Directives 10/30/2017 10/19/2016 01/26/2016 08/28/2015 01/12/2015 01/12/2015 02/25/2013  Does Patient Have a Medical Advance Directive? No No No No No No Patient would not like information;Patient does not have advance directive  Would patient like information on creating a medical advance directive? Yes (ED - Information included in AVS) No - Patient declined No - Patient declined No - patient declined information Yes - EScientist, clinical (histocompatibility and immunogenetics)given Yes - EScientist, clinical (histocompatibility and immunogenetics)given -    Tobacco Social History   Tobacco Use  Smoking Status Current Every Day Smoker  . Packs/day: 1.00  . Years: 60.00  . Pack years: 60.00  . Types: Cigarettes  Smokeless Tobacco Former USystems developer . Types: Chew  . Quit date: 10/20/1983  Tobacco Comment   18     Ready to quit: No Counseling given: Yes Comment: 18   Clinical Intake:  Pre-visit preparation completed: Yes  Pain : No/denies pain Pain Score: 0-No pain     BMI - recorded: 22 Nutritional Status: BMI of 19-24  Normal Nutritional Risks: None Diabetes: Yes CBG done?: No Did pt. bring in CBG monitor from home?: No  How often do you need to have someone help you when you read instructions, pamphlets, or other written materials from your doctor or pharmacy?: 3 - Sometimes What is the last grade level you completed in  school?: 8  Interpreter Needed?: No     Past Medical History:  Diagnosis Date  . Allergy   . Anxiety   . Arthritis    "joints" (02/25/2013)  . Chronic low back pain   . COPD (chronic obstructive pulmonary disease) ([place]   . Depression   . ED (erectile dysfunction)   . GERD (gastroesophageal reflux disease)   . Hyperlipidemia   . Hypertension   . Insomnia   . Nicotine addiction   . Peripheral arterial disease ([place]   . [place]al artery stenosis ([place]   . Type II diabetes mellitus ([place]    Past Surgical History:  Procedure Laterality Date  . COLONOSCOPY    . COLONOSCOPY N/A 08/28/2015   Procedure: COLONOSCOPY;  Surgeon: NRogene Houston MD;  Location: [facility];  Service: Endoscopy;  Laterality: N/A;  10:15 - moved to 10:45 - Ann to notify pt  . FEMORAL ARTERY STENT Right 02/25/2013  . LOWER EXTREMITY ANGIOGRAM Bilateral 02/25/2013   Procedure: LOWER EXTREMITY ANGIOGRAM;  Surgeon: JLorretta Harp MD;  [facility];  Service: Cardiovascular;  Laterality: Bilateral;   Family History  Problem Relation Age of Onset  . Diabetes Mother   . Diabetes Father   . Stroke Father   . Diabetes Sister   . CAD Brother        CABG   Social History   Socioeconomic History  . Marital status: Divorced    Spouse  name: Not on file  . Number of children: Not on file  . Years of education: Not on file  . Highest education level: Not on file  Occupational History  . Not on file  Social Needs  . Financial resource strain: Not hard at all  . Food insecurity    Worry: Never true    Inability: Never true  . Transportation needs    Medical: No    Non-medical: No  Tobacco Use  . Smoking status: Current Every Day Smoker    Packs/day: 1.00    Years: 60.00    Pack years: 60.00    Types: Cigarettes  . Smokeless tobacco: Former Systems developer    Types: [place] date: 10/20/1983  . Tobacco comment: 18  Substance and Sexual Activity  . Alcohol use: No    Comment: Patient denies  .  Drug use: No  . Sexual activity: Never  Lifestyle  . Physical activity    Days per week: 0 days    Minutes per session: 0 min  . Stress: Only a little  Relationships  . Social Herbalist on phone: Twice a week    Gets together: Twice a week    Attends religious service: Never    Active member of club or organization: No    Attends meetings of clubs or organizations: Not on file    Relationship status: Divorced  Other Topics Concern  . Not on file  Social History Narrative  . Not on file    Outpatient Encounter Medications as of 11/02/2018  Medication Sig  . ADVAIR DISKUS 100-50 MCG/DOSE AEPB INHALE 1 INHALATION EVERY 12 HOURS.  Marland Kitchen amLODipine (NORVASC) 10 MG tablet Take 1 tablet (10 mg total) by mouth daily.  Marland Kitchen aspirin EC 325 MG EC tablet Take 1 tablet (325 mg total) by mouth daily.  . B Complex-C (B-COMPLEX WITH VITAMIN C) tablet Take 1 tablet by mouth daily.  . Blood Glucose Monitoring Suppl (ONE TOUCH ULTRA 2) w/Device KIT One touch ultra meter only dx E11.65  . fluticasone (FLONASE) 50 MCG/ACT nasal spray USE 1 SPRAY IN EACH NOSTRIL ONCE DAILY.  . fluticasone (FLONASE) 50 MCG/ACT nasal spray USE 1 SPRAY IN EACH NOSTRIL ONCE DAILY.  Marland Kitchen gabapentin (NEURONTIN) 300 MG capsule TAKE 1 CAPSULE BY MOUTH TWICE DAILY AND 2 CAPSULES AT BEDTIME.  . hydrALAZINE (APRESOLINE) 50 MG tablet TAKE 1 TABLET BY MOUTH TWICE DAILY, 12 HOURS APART.  Marland Kitchen HYDROcodone-acetaminophen (NORCO) 7.5-325 MG tablet Take one tablet by mouth two times daily for back pain  . JANUVIA 100 MG tablet TAKE ONE TABLET BY MOUTH DAILY.  Marland Kitchen LANTUS SOLOSTAR 100 UNIT/ML Solostar Pen INJECT 7 UNITS UNDER THE SKIN EVERY MORNING (MAX 15 UNITS).  Marland Kitchen losartan (COZAAR) 100 MG tablet Take 1 tablet (100 mg total) by mouth daily.  . multivitamin-lutein (OCUVITE-LUTEIN) CAPS capsule Take 1 capsule by mouth daily.  Marland Kitchen NOVOLOG MIX 70/30 FLEXPEN (70-30) 100 UNIT/ML FlexPen INJECT 35 UNITS UNDER THE SKIN TWICE DAILY WITH A MEAL.  Marland Kitchen ONE  TOUCH ULTRA TEST test strip USE TO TEST THREE TIMES DAILY.  Marland Kitchen ONETOUCH DELICA LANCETS 76B MISC USE TO TEST THREE TIMES DAILY.  . pantoprazole (PROTONIX) 40 MG tablet TAKE ONE TABLET BY MOUTH DAILY.  Marland Kitchen polyethylene glycol (MIRALAX / GLYCOLAX) packet Take 17 g by mouth daily as needed for mild constipation. Mix with 8 oz of water.  . pravastatin (PRAVACHOL) 10 MG tablet Take 1 tablet (10 mg total)  by mouth daily.  . SURE COMFORT PEN NEEDLES 31G X 8 MM MISC USE AS DIRECTED.  Marland Kitchen tamsulosin (FLOMAX) 0.4 MG CAPS capsule TAKE ONE CAPSULE BY MOUTH DAILY.  Marland Kitchen temazepam (RESTORIL) 15 MG capsule Take 1 capsule (15 mg total) by mouth at bedtime as needed. for sleep   Facility-Administered Encounter Medications as of 11/02/2018  Medication  . cyanocobalamin ((VITAMIN B-12)) injection 1,000 mcg    Activities of Daily Living In your present state of health, do you have any difficulty performing the following activities: 11/02/2018  Hearing? N  Vision? N  Difficulty concentrating or making decisions? N  Walking or climbing stairs? Y  Dressing or bathing? N  Doing errands, shopping? Y  Comment needs a Engineer, manufacturing systems and eating ? N  Using the Toilet? N  In the past six months, have you accidently leaked urine? N  Do you have problems with loss of bowel control? N  Managing your Medications? N  Managing your Finances? N  Housekeeping or managing your Housekeeping? N  Some recent data might be hidden    Patient Care Team: Fayrene Helper, MD as PCP - General Rehman, Mechele Dawley, MD as Consulting Physician (Gastroenterology) Bettina Gavia, OD (Optometry)   Assessment:   This is a routine wellness examination for Gwyndolyn Saxon.  Exercise Activities and Dietary recommendations Current Exercise Habits: Home exercise routine, Type of exercise: walking, Time (Minutes): 30, Frequency (Times/Week): 4, Weekly Exercise (Minutes/Week): 120, Intensity: Mild, Exercise limited by: None identified  Goals     . Exercise 3x per week (30 min per time)     Starting 01/26/2016 patient would like to increase his exercise to 30 minutes a day 3 times a week.    . Quit smoking / using tobacco       Fall Risk Fall Risk  11/02/2018 11/01/2018 07/31/2018 01/02/2018 10/30/2017  Falls in the past year? 0 0 0 0 No  Number falls in past yr: 0 0 0 - -  Injury with Fall? 0 0 0 - -  Risk for fall due to : - - - - -   Is the patient's home free of loose throw rugs in walkways, pet beds, electrical cords, etc?   yes      Grab bars in the bathroom? yes      Handrails on the stairs?   yes      Adequate lighting?   yes     Depression Screen PHQ 2/9 Scores 11/02/2018 07/31/2018 01/02/2018 10/30/2017  PHQ - 2 Score 0 0 0 0  PHQ- 9 Score - - - -    Cognitive Function     6CIT Screen 11/02/2018 10/30/2017 10/19/2016 01/26/2016  What Year? 0 points 0 points 0 points 0 points  What month? 0 points 0 points 0 points 0 points  What time? 0 points 0 points 0 points 0 points  Count back from 20 0 points - 0 points 0 points  Months in reverse 0 points - 0 points 0 points  Repeat phrase 0 points - 0 points 2 points  Total Score 0 - 0 2    Immunization History  Administered Date(s) Administered  . Fluad Quad(high Dose 65+) 11/01/2018  . Influenza Split 11/08/2010, 11/11/2011  . Influenza Whole 11/17/2006  . Influenza,inj,Quad PF,6+ Mos 10/29/2012, 11/28/2013, 12/16/2014, 10/14/2015, 11/17/2016, 10/30/2017  . Pneumococcal Conjugate-13 01/22/2014  . Pneumococcal Polysaccharide-23 08/29/2003, 10/07/2010  . Td 08/31/2002  . Tdap 12/16/2014  . Zoster 10/07/2010    Qualifies  for Shingles Vaccine?  Completed   Screening Tests Health Maintenance  Topic Date Due  . FOOT EXAM  06/11/2018  . HEMOGLOBIN A1C  02/01/2019  . OPHTHALMOLOGY EXAM  04/24/2019  . TETANUS/TDAP  12/15/2024  . INFLUENZA VACCINE  Completed  . PNA vac Low Risk Adult  Completed   Cancer Screenings: Lung: Low Dose CT Chest recommended if Age  31-80 years, 30 pack-year currently smoking OR have quit w/in 15years. Patient does qualify. Declines repeat scanning.  Colorectal:  Last was 2017  Additional Screenings:   Hepatitis C Screening: not completed      Plan:      1. Encounter for Medicare annual wellness exam  I have personally reviewed and noted the following in the patient's chart:   . Medical and social history . Use of alcohol, tobacco or illicit drugs  . Current medications and supplements . Functional ability and status . Nutritional status . Physical activity . Advanced directives . List of other physicians . Hospitalizations, surgeries, and ER visits in previous 12 months . Vitals . Screenings to include cognitive, depression, and falls . Referrals and appointments  In addition, I have reviewed and discussed with patient certain preventive protocols, quality metrics, and best practice recommendations. A written personalized care plan for preventive services as well as general preventive health recommendations were provided to patient.   I provided 20 minutes of non-face-to-face time during this encounter.   Perlie Mayo, NP  11/02/2018

## 2018-11-03 ENCOUNTER — Encounter: Payer: Self-pay | Admitting: Family Medicine

## 2018-11-03 DIAGNOSIS — Z23 Encounter for immunization: Secondary | ICD-10-CM | POA: Insufficient documentation

## 2018-11-03 DIAGNOSIS — G8929 Other chronic pain: Secondary | ICD-10-CM | POA: Insufficient documentation

## 2018-11-03 MED ORDER — HYDROCODONE-ACETAMINOPHEN 7.5-325 MG PO TABS: ORAL_TABLET | ORAL | 0 refills | Status: AC

## 2018-11-03 MED ORDER — TEMAZEPAM 15 MG PO CAPS: 15.0000 mg | ORAL_CAPSULE | Freq: Every evening | ORAL | 5 refills | Status: DC | PRN

## 2018-11-03 NOTE — Progress Notes (Signed)
   Donald Everett     MRN: [id number]      DOB: 08/25/1940   HPI: Patient is in for annual physical exam. Pain management is addressed Vit B12 administered  Also , which he gets on a monthly basisi  Immunization is reviewed , and  updated .    PE; BP (!) 160/70   Pulse 76   Temp 97.8 F (36.6 C) (Temporal)   Resp 15   Ht 5\' 9"  (1.753 m)   Wt 149 lb (67.6 kg)   SpO2 98%   BMI 22.00 kg/m   Pleasant male, alert and oriented x 3, in no cardio-pulmonary distress. Afebrile. HEENT No facial trauma or asymetry. Sinuses non tender. EOMI, . External ears normal,. Neck: supple, no adenopathy,JVD or thyromegaly.No bruits.  Chest: Clear to ascultation bilaterally.No crackles or wheezes. Non tender to palpation, decreased air entry throughout    Cardiovascular system; Heart sounds normal,  S1 and  S2 ,no S3.  No murmur, or thrill. Apical beat not displaced Peripheral pulses normal.  Abdomen: Soft, non tender, no palpable  organomegaly or masses.   No guarding, tenderness or rebound.    Musculoskeletal exam: Decreased though adequate  ROM of spine, hips , shoulders and knees. No deformity ,swelling or crepitus noted.  muscle wasting no atrophy.   Neurologic: Cranial nerves 2 to 12 intact. Power, tone , normal  Decreased /sensation in feet. No disturbance in gait. No tremor.  Skin: Intact, no ulceration, mild erythema , and callus on feet noted. Pigmentation normal throughout  Psych; Normal mood and affect. Judgement and concentration normal   Assessment & Plan:  Annual physical exam Annual exam as documented. Counseling done  re healthy lifestyle involving commitment to 150 minutes exercise per week, heart healthy diet, and attaining healthy weight.The importance of adequate sleep also discussed. Regular seat belt use and home safety, is also discussed. Changes in health habits are decided on by the patient with goals and time frames  set for achieving them.  Immunization and cancer screening needs are specifically addressed at this visit.   Need for immunization against influenza After obtaining informed consent, the vaccine is  administered , with no adverse effect noted at the time of administration.   B12 deficiency anemia B12 1 cc administered IM

## 2018-11-03 NOTE — Assessment & Plan Note (Signed)
After obtaining informed consent, the vaccine is  administered , with no adverse effect noted at the time of administration.  

## 2018-11-03 NOTE — Assessment & Plan Note (Signed)
Over corrected, encouraged pt to stop lantus Will f/u lab when available

## 2018-11-03 NOTE — Assessment & Plan Note (Signed)

## 2018-11-03 NOTE — Assessment & Plan Note (Signed)
B12 1 cc administered IM

## 2018-11-03 NOTE — Assessment & Plan Note (Signed)
The patient's Controlled Substance registry is reviewed and compliance confirmed. Adequacy of  Pain control and level of function is assessed. Medication dosing is adjusted as deemed appropriate. Twelve weeks of medication is prescribed , patient signs for the script and is provided with a follow up appointment between 11 to 12 weeks .  

## 2018-11-05 ENCOUNTER — Ambulatory Visit: Payer: PPO

## 2018-11-05 ENCOUNTER — Other Ambulatory Visit: Payer: Self-pay | Admitting: Family Medicine

## 2018-11-21 ENCOUNTER — Other Ambulatory Visit: Payer: Self-pay | Admitting: Family Medicine

## 2018-11-30 ENCOUNTER — Other Ambulatory Visit: Payer: Self-pay

## 2018-11-30 ENCOUNTER — Ambulatory Visit (INDEPENDENT_AMBULATORY_CARE_PROVIDER_SITE_OTHER): Payer: PPO

## 2018-11-30 DIAGNOSIS — D519 Vitamin B12 deficiency anemia, unspecified: Secondary | ICD-10-CM

## 2018-11-30 DIAGNOSIS — E785 Hyperlipidemia, unspecified: Secondary | ICD-10-CM | POA: Diagnosis not present

## 2018-11-30 DIAGNOSIS — E1140 Type 2 diabetes mellitus with diabetic neuropathy, unspecified: Secondary | ICD-10-CM | POA: Diagnosis not present

## 2018-11-30 DIAGNOSIS — E559 Vitamin D deficiency, unspecified: Secondary | ICD-10-CM | POA: Diagnosis not present

## 2018-11-30 DIAGNOSIS — N521 Erectile dysfunction due to diseases classified elsewhere: Secondary | ICD-10-CM | POA: Diagnosis not present

## 2018-11-30 MED ORDER — CYANOCOBALAMIN 1000 MCG/ML IJ SOLN
1000.0000 ug | Freq: Once | INTRAMUSCULAR | Status: AC
  Administered 2018-11-30: 1000 ug via INTRAMUSCULAR

## 2018-11-30 NOTE — Progress Notes (Signed)
B12 given in left deltoid. Tolerated well

## 2018-12-01 LAB — COMPLETE METABOLIC PANEL WITH GFR
AG Ratio: 1.4 (calc) (ref 1.0–2.5)
ALT: 12 U/L (ref 9–46)
AST: 20 U/L (ref 10–35)
Albumin: 3.3 g/dL — ABNORMAL LOW (ref 3.6–5.1)
Alkaline phosphatase (APISO): 69 U/L (ref 35–144)
BUN/Creatinine Ratio: 19 (calc) (ref 6–22)
BUN: 26 mg/dL — ABNORMAL HIGH (ref 7–25)
CO2: 23 mmol/L (ref 20–32)
Calcium: 8.8 mg/dL (ref 8.6–10.3)
Chloride: 107 mmol/L (ref 98–110)
Creat: 1.36 mg/dL — ABNORMAL HIGH (ref 0.70–1.18)
GFR, Est African American: 57 mL/min/{1.73_m2} — ABNORMAL LOW (ref >=60)
GFR, Est Non African American: 49 mL/min/{1.73_m2} — ABNORMAL LOW (ref >=60)
Globulin: 2.4 g/dL (calc) (ref 1.9–3.7)
Glucose, Bld: 116 mg/dL — ABNORMAL HIGH (ref 65–99)
Potassium: 5 mmol/L (ref 3.5–5.3)
Sodium: 137 mmol/L (ref 135–146)
Total Bilirubin: 0.5 mg/dL (ref 0.2–1.2)
Total Protein: 5.7 g/dL — ABNORMAL LOW (ref 6.1–8.1)

## 2018-12-01 LAB — TSH: TSH: 3.21 mIU/L (ref 0.40–4.50)

## 2018-12-01 LAB — LIPID PANEL
Cholesterol: 105 mg/dL (ref <200)
HDL: 32 mg/dL — ABNORMAL LOW (ref >=40)
LDL Cholesterol (Calc): 57 mg/dL (calc)
Non-HDL Cholesterol (Calc): 73 mg/dL (calc) (ref <130)
Total CHOL/HDL Ratio: 3.3 (calc) (ref <5.0)
Triglycerides: 78 mg/dL (ref <150)

## 2018-12-01 LAB — VITAMIN D 25 HYDROXY (VIT D DEFICIENCY, FRACTURES): Vit D, 25-Hydroxy: 19 ng/mL — ABNORMAL LOW (ref 30–100)

## 2018-12-01 LAB — HEMOGLOBIN A1C
Hgb A1c MFr Bld: 6.7 % of total Hgb — ABNORMAL HIGH (ref <5.7)
Mean Plasma Glucose: 146 (calc)
eAG (mmol/L): 8.1 (calc)

## 2018-12-11 ENCOUNTER — Other Ambulatory Visit: Payer: Self-pay | Admitting: Family Medicine

## 2018-12-13 ENCOUNTER — Other Ambulatory Visit: Payer: Self-pay

## 2018-12-13 NOTE — Progress Notes (Signed)
Pt aware.

## 2018-12-17 ENCOUNTER — Other Ambulatory Visit: Payer: Self-pay | Admitting: Family Medicine

## 2018-12-27 ENCOUNTER — Other Ambulatory Visit: Payer: Self-pay | Admitting: Family Medicine

## 2018-12-31 ENCOUNTER — Ambulatory Visit: Payer: PPO

## 2018-12-31 ENCOUNTER — Other Ambulatory Visit: Payer: Self-pay

## 2018-12-31 DIAGNOSIS — D519 Vitamin B12 deficiency anemia, unspecified: Secondary | ICD-10-CM

## 2018-12-31 DIAGNOSIS — E538 Deficiency of other specified B group vitamins: Secondary | ICD-10-CM

## 2018-12-31 MED ORDER — CYANOCOBALAMIN 1000 MCG/ML IJ SOLN
1000.0000 ug | Freq: Once | INTRAMUSCULAR | Status: AC
  Administered 2019-06-06: 14:00:00 1000 ug via INTRAMUSCULAR

## 2018-12-31 MED ORDER — CYANOCOBALAMIN 1000 MCG/ML IJ SOLN
1000.0000 ug | Freq: Once | INTRAMUSCULAR | Status: AC
  Administered 2018-12-31: 1000 ug via INTRAMUSCULAR

## 2019-01-17 ENCOUNTER — Encounter: Payer: Self-pay | Admitting: Family Medicine

## 2019-01-17 ENCOUNTER — Ambulatory Visit (INDEPENDENT_AMBULATORY_CARE_PROVIDER_SITE_OTHER): Payer: PPO | Admitting: Family Medicine

## 2019-01-17 ENCOUNTER — Other Ambulatory Visit: Payer: Self-pay

## 2019-01-17 VITALS — BP 142/70 | HR 75 | Temp 98.7°F | Resp 15 | Ht 68.0 in | Wt 148.0 lb

## 2019-01-17 DIAGNOSIS — F17200 Nicotine dependence, unspecified, uncomplicated: Secondary | ICD-10-CM

## 2019-01-17 DIAGNOSIS — Z125 Encounter for screening for malignant neoplasm of prostate: Secondary | ICD-10-CM

## 2019-01-17 DIAGNOSIS — E785 Hyperlipidemia, unspecified: Secondary | ICD-10-CM

## 2019-01-17 DIAGNOSIS — I150 Renovascular hypertension: Secondary | ICD-10-CM | POA: Diagnosis not present

## 2019-01-17 DIAGNOSIS — G8929 Other chronic pain: Secondary | ICD-10-CM

## 2019-01-17 DIAGNOSIS — N521 Erectile dysfunction due to diseases classified elsewhere: Secondary | ICD-10-CM | POA: Diagnosis not present

## 2019-01-17 DIAGNOSIS — E1159 Type 2 diabetes mellitus with other circulatory complications: Secondary | ICD-10-CM

## 2019-01-17 DIAGNOSIS — E1140 Type 2 diabetes mellitus with diabetic neuropathy, unspecified: Secondary | ICD-10-CM

## 2019-01-17 MED ORDER — TEMAZEPAM 15 MG PO CAPS: 15.0000 mg | ORAL_CAPSULE | Freq: Every evening | ORAL | 1 refills | Status: DC | PRN

## 2019-01-17 NOTE — Patient Instructions (Addendum)
F/U 3 rd week in March, call if you need me before  Please be comfortable with blood sugar between 140 to 180, new aim for fasting is 120 to 140  Blood pressure improved  15 ciggs/ day in March, now at 18  Non fasting HBA1c , Cmp and EGFr and PSA Jan 16 or after  Best for 2021!

## 2019-01-18 ENCOUNTER — Encounter: Payer: Self-pay | Admitting: Family Medicine

## 2019-01-18 MED ORDER — HYDROCODONE-ACETAMINOPHEN 7.5-325 MG PO TABS: ORAL_TABLET | ORAL | 0 refills | Status: DC

## 2019-01-18 MED ORDER — HYDROCODONE-ACETAMINOPHEN 7.5-325 MG PO TABS: ORAL_TABLET | ORAL | 0 refills | Status: AC

## 2019-01-18 MED ORDER — TEMAZEPAM 15 MG PO CAPS: 15.0000 mg | ORAL_CAPSULE | Freq: Every day | ORAL | 5 refills | Status: DC

## 2019-01-18 NOTE — Assessment & Plan Note (Signed)
Hyperlipidemia:Low fat diet discussed and encouraged.   Lipid Panel  Lab Results  Component Value Date   CHOL 105 11/30/2018   HDL 32 (L) 11/30/2018   LDLCALC 57 11/30/2018   TRIG 78 11/30/2018   CHOLHDL 3.3 11/30/2018  Controlled, no change in medication

## 2019-01-18 NOTE — Assessment & Plan Note (Signed)
Asked:confirms currently smokes cigarettes, now at 15/day Assess: Unwilling to quit but cutting back Advise: needs to QUIT to reduce risk of cancer, cardio and cerebrovascular disease Assist: counseled for 5 minutes and literature provided Arrange: follow up in 3 months

## 2019-01-18 NOTE — Assessment & Plan Note (Signed)
The patient's Controlled Substance registry is reviewed and compliance confirmed. Adequacy of  Pain control and level of function is assessed. Medication dosing is adjusted as deemed appropriate. Twelve weeks of medication is prescribed , patient signs for the script and is provided with a follow up appointment between 11 to 12 weeks .  

## 2019-01-18 NOTE — Progress Notes (Signed)
Donald Everett     MRN: [id number]      DOB: 1940-06-30   HPI Donald Everett is here for follow up and re-evaluation of chronic medical conditions, medication management and review of any available recent lab and radiology data.  Preventive health is updated, specifically  Cancer screening and Immunization.    The PT denies any adverse reactions to current medications since the last visit.  There are no new concerns.  There are no specific complaints  Denies polyuria, polydipsia, blurred vision , or hypoglycemic episodes.  ROS Denies recent fever or chills. Denies sinus pressure, nasal congestion, ear pain or sore throat. Denies chest congestion, productive cough or wheezing. Denies chest pains, palpitations and leg swelling Denies abdominal pain, nausea, vomiting,diarrhea or constipation.   Denies dysuria, frequency, hesitancy or incontinence. Denies uncontrolled joint pain, swelling and limitation in mobility. Denies headaches, seizures, numbness, or tingling. Denies depression, anxiety or insomnia. Denies skin break down or rash.   PE  BP (!) 142/70   Pulse 75   Temp 98.7 F (37.1 C) (Temporal)   Resp 15   Ht 5\' 8"  (1.727 m)   Wt 148 lb 0.6 oz (67.2 kg)   SpO2 96%   BMI 22.51 kg/m   Patient alert and oriented and in no cardiopulmonary distress.  HEENT: No facial asymmetry, EOMI,     Neck supple .  Chest: Clear to auscultation bilaterally.decreased air entry  CVS: S1, S2 no murmurs, no S3.Regular rate.  ABD: Soft non tender.   Ext: No edema   ROM spine, , hips and knees.  Skin: Intact, no ulcerations or rash noted.  Psych: Good eye contact, normal affect. not anxious or depressed appearing.  CNS: CN 2-12 intact, power,  normal throughout.no focal deficits noted.   Assessment & Plan  *Encounter for chronic pain management The patient's Controlled Substance registry is reviewed and compliance confirmed. Adequacy of  Pain control and level of  function is assessed. Medication dosing is adjusted as deemed appropriate. Twelve weeks of medication is prescribed , patient signs for the script and is provided with a follow up appointment between 11 to 12 weeks .   NICOTINE ADDICTION Asked:confirms currently smokes cigarettes, now at 15/day Assess: Unwilling to quit but cutting back Advise: needs to QUIT to reduce risk of cancer, cardio and cerebrovascular disease Assist: counseled for 5 minutes and literature provided Arrange: follow up in 3 months   Type 2 diabetes mellitus with vascular disease (HCC) Over corrected, advised reduce dose of lantus Updated lab needed at/ before next visit. . Donald Everett is reminded of the importance of commitment to daily physical activity for 30 minutes or more, as able and the need to limit carbohydrate intake to 30 to 60 grams per meal to help with blood sugar control.   The need to take medication as prescribed, test blood sugar as directed, and to call between visits if there is a concern that blood sugar is uncontrolled is also discussed.   Donald Everett is reminded of the importance of daily foot exam, annual eye examination, and good blood sugar, blood pressure and cholesterol control.  Diabetic Labs Latest Ref Rng & Units 11/30/2018 08/02/2018 04/24/2018 04/19/2018 12/25/2017  HbA1c <5.7 % of total Hgb 6.7(H) 6.4(H) - 7.2(H) 7.1(H)  Microalbumin mg/dL - - - - 3.1  Micro/Creat Ratio <30 mcg/mg creat - - - - 70(H)  Chol <200 mg/dL 13/12/2017 - - 841 -  HDL > OR = 40 mg/dL 660) - -  28(L) -  Calc LDL mg/dL (calc) 57 - - 63 -  Triglycerides <150 mg/dL 78 - - 90 -  Creatinine 0.70 - 1.18 mg/dL 1.36(H) 1.42(H) 1.42(H) 1.57(H) 1.31(H)   BP/Weight 01/17/2019 11/02/2018 11/01/2018 07/31/2018 07/04/2018 05/03/2018 6/60/6004  Systolic BP 599 774 142 395 320 233 435  Diastolic BP 70 70 70 70 70 70 78  Wt. (Lbs) 148.04 149 149 148 148 - 146  BMI 22.51 22 22 21.86 21.86 - 21.56   Foot/eye exam completion dates  Latest Ref Rng & Units 11/01/2018 04/23/2018  Eye Exam No Retinopathy - No Retinopathy  Foot Form Completion - Done -        Hyperlipidemia LDL goal <100 Hyperlipidemia:Low fat diet discussed and encouraged.   Lipid Panel  Lab Results  Component Value Date   CHOL 105 11/30/2018   HDL 32 (L) 11/30/2018   LDLCALC 57 11/30/2018   TRIG 78 11/30/2018   CHOLHDL 3.3 11/30/2018  Controlled, no change in medication

## 2019-01-18 NOTE — Assessment & Plan Note (Signed)
Over corrected, advised reduce dose of lantus Updated lab needed at/ before next visit. . Donald Everett is reminded of the importance of commitment to daily physical activity for 30 minutes or more, as able and the need to limit carbohydrate intake to 30 to 60 grams per meal to help with blood sugar control.   The need to take medication as prescribed, test blood sugar as directed, and to call between visits if there is a concern that blood sugar is uncontrolled is also discussed.   Donald Everett is reminded of the importance of daily foot exam, annual eye examination, and good blood sugar, blood pressure and cholesterol control.  Diabetic Labs Latest Ref Rng & Units 11/30/2018 08/02/2018 04/24/2018 04/19/2018 12/25/2017  HbA1c <5.7 % of total Hgb 6.7(H) 6.4(H) - 7.2(H) 7.1(H)  Microalbumin mg/dL - - - - 3.1  Micro/Creat Ratio <30 mcg/mg creat - - - - 70(H)  Chol <200 mg/dL 105 - - 108 -  HDL > OR = 40 mg/dL 32(L) - - 28(L) -  Calc LDL mg/dL (calc) 57 - - 63 -  Triglycerides <150 mg/dL 78 - - 90 -  Creatinine 0.70 - 1.18 mg/dL 1.36(H) 1.42(H) 1.42(H) 1.57(H) 1.31(H)   BP/Weight 01/17/2019 11/02/2018 11/01/2018 07/31/2018 07/04/2018 05/03/2018 9/92/4268  Systolic BP 341 962 229 798 921 194 174  Diastolic BP 70 70 70 70 70 70 78  Wt. (Lbs) 148.04 149 149 148 148 - 146  BMI 22.51 22 22 21.86 21.86 - 21.56   Foot/eye exam completion dates Latest Ref Rng & Units 11/01/2018 04/23/2018  Eye Exam No Retinopathy - No Retinopathy  Foot Form Completion - Done -

## 2019-01-29 ENCOUNTER — Ambulatory Visit (INDEPENDENT_AMBULATORY_CARE_PROVIDER_SITE_OTHER): Payer: PPO

## 2019-01-29 ENCOUNTER — Other Ambulatory Visit: Payer: Self-pay

## 2019-01-29 DIAGNOSIS — D518 Other vitamin B12 deficiency anemias: Secondary | ICD-10-CM | POA: Diagnosis not present

## 2019-01-29 MED ORDER — CYANOCOBALAMIN 1000 MCG/ML IJ SOLN
1000.0000 ug | Freq: Once | INTRAMUSCULAR | Status: AC
  Administered 2019-01-29: 1000 ug via INTRAMUSCULAR

## 2019-01-29 NOTE — Progress Notes (Signed)
B12 GIVEN IN LEFT DELTOID. DUE AGAIN IN 1 MONTH

## 2019-02-04 ENCOUNTER — Other Ambulatory Visit: Payer: Self-pay | Admitting: Family Medicine

## 2019-02-18 ENCOUNTER — Ambulatory Visit: Payer: PPO

## 2019-02-18 ENCOUNTER — Other Ambulatory Visit: Payer: Self-pay | Admitting: Family Medicine

## 2019-03-05 ENCOUNTER — Other Ambulatory Visit: Payer: Self-pay

## 2019-03-05 ENCOUNTER — Ambulatory Visit (INDEPENDENT_AMBULATORY_CARE_PROVIDER_SITE_OTHER): Payer: PPO

## 2019-03-05 DIAGNOSIS — D519 Vitamin B12 deficiency anemia, unspecified: Secondary | ICD-10-CM | POA: Diagnosis not present

## 2019-03-05 DIAGNOSIS — I150 Renovascular hypertension: Secondary | ICD-10-CM | POA: Diagnosis not present

## 2019-03-05 DIAGNOSIS — N521 Erectile dysfunction due to diseases classified elsewhere: Secondary | ICD-10-CM | POA: Diagnosis not present

## 2019-03-05 DIAGNOSIS — Z125 Encounter for screening for malignant neoplasm of prostate: Secondary | ICD-10-CM | POA: Diagnosis not present

## 2019-03-05 DIAGNOSIS — E1140 Type 2 diabetes mellitus with diabetic neuropathy, unspecified: Secondary | ICD-10-CM | POA: Diagnosis not present

## 2019-03-05 MED ORDER — CYANOCOBALAMIN 1000 MCG/ML IJ SOLN
1000.0000 ug | Freq: Once | INTRAMUSCULAR | Status: AC
  Administered 2019-03-05: 1000 ug via INTRAMUSCULAR

## 2019-03-06 LAB — COMPLETE METABOLIC PANEL WITH GFR
AG Ratio: 1.2 (calc) (ref 1.0–2.5)
ALT: 12 U/L (ref 9–46)
AST: 19 U/L (ref 10–35)
Albumin: 3.3 g/dL — ABNORMAL LOW (ref 3.6–5.1)
Alkaline phosphatase (APISO): 73 U/L (ref 35–144)
BUN/Creatinine Ratio: 17 (calc) (ref 6–22)
BUN: 27 mg/dL — ABNORMAL HIGH (ref 7–25)
CO2: 24 mmol/L (ref 20–32)
Calcium: 8.8 mg/dL (ref 8.6–10.3)
Chloride: 105 mmol/L (ref 98–110)
Creat: 1.61 mg/dL — ABNORMAL HIGH (ref 0.70–1.18)
GFR, Est African American: 47 mL/min/{1.73_m2} — ABNORMAL LOW (ref >=60)
GFR, Est Non African American: 40 mL/min/{1.73_m2} — ABNORMAL LOW (ref >=60)
Globulin: 2.7 g/dL (calc) (ref 1.9–3.7)
Glucose, Bld: 164 mg/dL — ABNORMAL HIGH (ref 65–139)
Potassium: 4.5 mmol/L (ref 3.5–5.3)
Sodium: 136 mmol/L (ref 135–146)
Total Bilirubin: 0.4 mg/dL (ref 0.2–1.2)
Total Protein: 6 g/dL — ABNORMAL LOW (ref 6.1–8.1)

## 2019-03-06 LAB — PSA: PSA: 0.7 ng/mL (ref <=4.0)

## 2019-03-06 LAB — HEMOGLOBIN A1C
Hgb A1c MFr Bld: 7.2 % of total Hgb — ABNORMAL HIGH (ref <5.7)
Mean Plasma Glucose: 160 (calc)
eAG (mmol/L): 8.9 (calc)

## 2019-03-20 ENCOUNTER — Other Ambulatory Visit: Payer: Self-pay | Admitting: Family Medicine

## 2019-03-29 ENCOUNTER — Other Ambulatory Visit: Payer: Self-pay | Admitting: Family Medicine

## 2019-04-03 ENCOUNTER — Telehealth: Payer: Self-pay

## 2019-04-03 ENCOUNTER — Other Ambulatory Visit: Payer: Self-pay

## 2019-04-03 ENCOUNTER — Ambulatory Visit (INDEPENDENT_AMBULATORY_CARE_PROVIDER_SITE_OTHER): Payer: PPO

## 2019-04-03 DIAGNOSIS — D519 Vitamin B12 deficiency anemia, unspecified: Secondary | ICD-10-CM

## 2019-04-03 MED ORDER — CYANOCOBALAMIN 1000 MCG/ML IJ SOLN
1000.0000 ug | Freq: Once | INTRAMUSCULAR | Status: AC
  Administered 2019-04-03: 1000 ug via INTRAMUSCULAR

## 2019-04-03 NOTE — Telephone Encounter (Signed)
Patient came in for his B12 shot today and asked for Brandi. I told him she wasn't in and asked if I could help him. He stated his insulin had been changed from Novolog to his current insulin. He said the new insulin isn't keeping his blood sugars down. They range from 180-190 in the mornings. The novolog would last through the night. Wants to know why the insulin was changed. I told him I would send the message. Patient aware Merry Proud and Dr.Simpson not in the office until Monday and is ok waiting for response.

## 2019-04-08 ENCOUNTER — Other Ambulatory Visit: Payer: Self-pay

## 2019-04-08 MED ORDER — NOVOLOG MIX 70/30 FLEXPEN (70-30) 100 UNIT/ML ~~LOC~~ SUPN: PEN_INJECTOR | SUBCUTANEOUS | 5 refills | Status: DC

## 2019-04-08 NOTE — Telephone Encounter (Signed)
Noted, thanks!

## 2019-04-08 NOTE — Telephone Encounter (Signed)
Med was changed back to requested previous med- novolog 70/30. Will follow up with PA if needed

## 2019-04-08 NOTE — Telephone Encounter (Signed)
Please follow upon this as we discussed see if we can pA, should not make such a difference , may need to change dose

## 2019-04-09 ENCOUNTER — Other Ambulatory Visit: Payer: Self-pay

## 2019-04-09 ENCOUNTER — Telehealth: Payer: Self-pay

## 2019-04-09 NOTE — Telephone Encounter (Signed)
Pt is calling to say the substitute for the Novolog is not working --wants you to call him

## 2019-04-09 NOTE — Telephone Encounter (Signed)
Aware that it was changed back to novolog

## 2019-04-17 ENCOUNTER — Other Ambulatory Visit: Payer: Self-pay | Admitting: Family Medicine

## 2019-04-24 DIAGNOSIS — H2513 Age-related nuclear cataract, bilateral: Secondary | ICD-10-CM | POA: Diagnosis not present

## 2019-04-24 DIAGNOSIS — Z794 Long term (current) use of insulin: Secondary | ICD-10-CM | POA: Diagnosis not present

## 2019-04-24 DIAGNOSIS — E119 Type 2 diabetes mellitus without complications: Secondary | ICD-10-CM | POA: Diagnosis not present

## 2019-04-24 DIAGNOSIS — H5203 Hypermetropia, bilateral: Secondary | ICD-10-CM | POA: Diagnosis not present

## 2019-04-24 DIAGNOSIS — H25013 Cortical age-related cataract, bilateral: Secondary | ICD-10-CM | POA: Diagnosis not present

## 2019-04-24 DIAGNOSIS — H524 Presbyopia: Secondary | ICD-10-CM | POA: Diagnosis not present

## 2019-04-24 DIAGNOSIS — H52203 Unspecified astigmatism, bilateral: Secondary | ICD-10-CM | POA: Diagnosis not present

## 2019-04-24 LAB — HM DIABETES EYE EXAM

## 2019-05-01 ENCOUNTER — Other Ambulatory Visit: Payer: Self-pay

## 2019-05-01 ENCOUNTER — Ambulatory Visit (INDEPENDENT_AMBULATORY_CARE_PROVIDER_SITE_OTHER): Payer: PPO

## 2019-05-01 DIAGNOSIS — D519 Vitamin B12 deficiency anemia, unspecified: Secondary | ICD-10-CM

## 2019-05-01 MED ORDER — NOVOLOG MIX 70/30 FLEXPEN (70-30) 100 UNIT/ML ~~LOC~~ SUPN: PEN_INJECTOR | SUBCUTANEOUS | 5 refills | Status: DC

## 2019-05-01 MED ORDER — CYANOCOBALAMIN 1000 MCG/ML IJ SOLN
1000.0000 ug | Freq: Once | INTRAMUSCULAR | Status: AC
  Administered 2019-05-01: 1000 ug via INTRAMUSCULAR

## 2019-05-03 ENCOUNTER — Other Ambulatory Visit: Payer: Self-pay | Admitting: Family Medicine

## 2019-05-03 ENCOUNTER — Telehealth: Payer: Self-pay

## 2019-05-03 NOTE — Telephone Encounter (Signed)
Pt is calling in to get refills

## 2019-05-03 NOTE — Telephone Encounter (Signed)
Refill sent to the pharmacy 

## 2019-05-08 ENCOUNTER — Ambulatory Visit: Payer: PPO | Admitting: Family Medicine

## 2019-05-20 ENCOUNTER — Other Ambulatory Visit: Payer: Self-pay | Admitting: Family Medicine

## 2019-05-22 ENCOUNTER — Other Ambulatory Visit: Payer: Self-pay | Admitting: Family Medicine

## 2019-05-22 ENCOUNTER — Telehealth: Payer: Self-pay

## 2019-05-22 MED ORDER — HYDROCODONE-ACETAMINOPHEN 7.5-325 MG PO TABS: ORAL_TABLET | ORAL | 0 refills | Status: AC

## 2019-05-22 NOTE — Telephone Encounter (Signed)
Medication is sent in. He has no pain medication in his system based on most recent prescription fill so bear in mind when doing UDS. I have prescribed one month only

## 2019-05-22 NOTE — Telephone Encounter (Signed)
Patient called today stating he is out of his pain medication now. He will come leave the urine as soon as possible and he is scheduled for April 22 which was the earliest appt. Wants a temp supply

## 2019-05-23 NOTE — Telephone Encounter (Signed)
Noted, thank you for follow up. 

## 2019-05-23 NOTE — Telephone Encounter (Signed)
Patient will come leave urine this week. Dr Lodema Hong sent in a temp supply but based on the registry he has been out of meds so if he comes today he will not have med in is system so she wants you to know that incase his med comes back not detected.

## 2019-06-06 ENCOUNTER — Ambulatory Visit (INDEPENDENT_AMBULATORY_CARE_PROVIDER_SITE_OTHER): Payer: PPO | Admitting: Family Medicine

## 2019-06-06 ENCOUNTER — Other Ambulatory Visit: Payer: Self-pay

## 2019-06-06 ENCOUNTER — Encounter: Payer: Self-pay | Admitting: Family Medicine

## 2019-06-06 VITALS — BP 150/70 | HR 72 | Temp 97.2°F | Resp 15 | Ht 68.0 in | Wt 148.1 lb

## 2019-06-06 DIAGNOSIS — J449 Chronic obstructive pulmonary disease, unspecified: Secondary | ICD-10-CM

## 2019-06-06 DIAGNOSIS — G8929 Other chronic pain: Secondary | ICD-10-CM

## 2019-06-06 DIAGNOSIS — E1159 Type 2 diabetes mellitus with other circulatory complications: Secondary | ICD-10-CM

## 2019-06-06 DIAGNOSIS — M5440 Lumbago with sciatica, unspecified side: Secondary | ICD-10-CM | POA: Diagnosis not present

## 2019-06-06 DIAGNOSIS — I150 Renovascular hypertension: Secondary | ICD-10-CM | POA: Diagnosis not present

## 2019-06-06 DIAGNOSIS — I701 Atherosclerosis of renal artery: Secondary | ICD-10-CM | POA: Diagnosis not present

## 2019-06-06 DIAGNOSIS — D519 Vitamin B12 deficiency anemia, unspecified: Secondary | ICD-10-CM | POA: Diagnosis not present

## 2019-06-06 DIAGNOSIS — F17200 Nicotine dependence, unspecified, uncomplicated: Secondary | ICD-10-CM

## 2019-06-06 NOTE — Assessment & Plan Note (Signed)
The patient's controlled substance registry was reviewed and compliance was confirmed.  Urine drug screen was collected today.  To demonstrate compliance.  Once results have resulted we will send in 12-week supply of medication and have follow-up appointment at that time. No medication adjustment or dosage needed.  Updated pain contract signed.

## 2019-06-06 NOTE — Assessment & Plan Note (Signed)
Needs B12 injection-we will set up appointment for this in near future

## 2019-06-06 NOTE — Assessment & Plan Note (Signed)
Controlled, use of of inhalers as directed reports not having to use his albuterol rescue inhaler more than once or twice a week.  Is still smoking smoking cessation was encouraged today.

## 2019-06-06 NOTE — Patient Instructions (Signed)
I appreciate the opportunity to provide you with care for your health and wellness. Today we discussed:  pain  Follow up: 12 weeks pain and chronic conditions  No labs or referrals today  Please continue to practice social distancing to keep you, your family, and our community safe.  If you must go out, please wear a mask and practice good handwashing.  It was a pleasure to see you and I look forward to continuing to work together on your health and well-being. Please do not hesitate to call the office if you need care or have questions about your care.  Have a wonderful day and week. With Gratitude, Tereasa Coop, DNP, AGNP-BC

## 2019-06-06 NOTE — Progress Notes (Signed)
Subjective:  Patient ID: Donald Everett, male    DOB: 02/02/1941  Age: 79 y.o. MRN: [id number]  CC:  Chief Complaint  Patient presents with  . pain management    12 week follow up. Will leave uds today       HPI  HPI Donald Everett is a 79 year old male patient Dr. Griffin Dakin who presents today for 12-week follow-up.  Will be leaving urine drug screen today.  Reports taking his pain medication for leg pain.  Takes it twice daily reports that it usually helps some but he still has some breakthrough pain.  Denies having any falls, chest pain, shortness of breath, difficulty with tolerating the current dose of pain medicine that he is on.  Indication for chronic opioid: Chronic low back pain Medication and dose: Norco, 7.5-325 mg twice daily # pills per month: 60 Last UDS date: Today in office Opioid Treatment Agreement signed (Y/N): Yes Opioid Treatment Agreement last reviewed with patient:  Today [place] reviewed this encounter (include red flags): YesToday no red flags noted  Today patient denies signs and symptoms of COVID 19 infection including fever, chills, cough, shortness of breath, and headache. Past Medical, Surgical, Social History, Allergies, and Medications have been Reviewed.   Past Medical History:  Diagnosis Date  . Allergy   . Anxiety   . Arthritis    "joints" (02/25/2013)  . Chronic low back pain   . COPD (chronic obstructive pulmonary disease) ([place])   . Depression   . ED (erectile dysfunction)   . ERECTILE DYSFUNCTION 06/20/2007   Qualifier: Diagnosis of  By: Dierdre Harness    . FATIGUE 03/24/2008   Qualifier: Diagnosis of  By: Moshe Cipro MD, Joycelyn Schmid    . GERD (gastroesophageal reflux disease)   . Hyperlipidemia   . Hypertension   . Insomnia   . Nicotine addiction   . Peripheral arterial disease ([place])   . Renal artery stenosis ([place])   . Seasonal allergies 02/05/2012  . Type II diabetes mellitus ([place])   . Unspecified visual loss 11/24/2009   Qualifier:  Diagnosis of  By: Moshe Cipro MD, Joycelyn Schmid      Current Meds  Medication Sig  . ADVAIR DISKUS 100-50 MCG/DOSE AEPB INHALE 1 INHALATION EVERY 12 HOURS.  Marland Kitchen amLODipine (NORVASC) 10 MG tablet TAKE ONE TABLET BY MOUTH DAILY.  Marland Kitchen aspirin EC 325 MG EC tablet Take 1 tablet (325 mg total) by mouth daily.  . B Complex-C (B-COMPLEX WITH VITAMIN C) tablet Take 1 tablet by mouth daily.  . Blood Glucose Monitoring Suppl (ONE TOUCH ULTRA 2) w/Device KIT One touch ultra meter only dx E11.65  . fluticasone (FLONASE) 50 MCG/ACT nasal spray USE 1 SPRAY IN EACH NOSTRIL ONCE DAILY.  Marland Kitchen gabapentin (NEURONTIN) 300 MG capsule TAKE 1 CAPSULE BY MOUTH TWICE DAILY AND 2 CAPSULES AT BEDTIME.  . hydrALAZINE (APRESOLINE) 50 MG tablet TAKE 1 TABLET BY MOUTH TWICE DAILY, 12 HOURS APART.  Marland Kitchen HYDROcodone-acetaminophen (NORCO) 7.5-325 MG tablet Take one tablet by mouth two times daily for pain  . JANUVIA 100 MG tablet TAKE ONE TABLET BY MOUTH DAILY.  Marland Kitchen Lancets (ONETOUCH DELICA PLUS VHQION62X) MISC USE TO TEST THREE TIMES DAILY.  Marland Kitchen losartan (COZAAR) 100 MG tablet Take 1 tablet (100 mg total) by mouth daily.  . multivitamin-lutein (OCUVITE-LUTEIN) CAPS capsule Take 1 capsule by mouth daily.  Marland Kitchen NOVOLOG MIX 70/30 FLEXPEN (70-30) 100 UNIT/ML FlexPen INJECT 35 UNITS UNDER THE SKIN TWICE DAILY WITH A MEAL.  Marland Kitchen ONETOUCH ULTRA test  strip USE TO TEST THREE TIMES DAILY.  . pantoprazole (PROTONIX) 40 MG tablet TAKE ONE TABLET BY MOUTH DAILY.  Marland Kitchen polyethylene glycol (MIRALAX / GLYCOLAX) packet Take 17 g by mouth daily as needed for mild constipation. Mix with 8 oz of water.  . pravastatin (PRAVACHOL) 10 MG tablet TAKE ONE TABLET BY MOUTH EVERY MORNING.  . SURE COMFORT PEN NEEDLES 31G X 8 MM MISC USE AS DIRECTED.  Marland Kitchen tamsulosin (FLOMAX) 0.4 MG CAPS capsule TAKE ONE CAPSULE BY MOUTH DAILY.  Marland Kitchen temazepam (RESTORIL) 15 MG capsule Take 1 capsule (15 mg total) by mouth at bedtime.  . [DISCONTINUED] fluticasone (FLONASE) 50 MCG/ACT nasal spray USE 1 SPRAY  IN EACH NOSTRIL ONCE DAILY.  . [DISCONTINUED] fluticasone (FLONASE) 50 MCG/ACT nasal spray USE 1 SPRAY IN EACH NOSTRIL ONCE DAILY.  . [DISCONTINUED] temazepam (RESTORIL) 15 MG capsule Take 1 capsule (15 mg total) by mouth at bedtime as needed. for sleep  . [DISCONTINUED] temazepam (RESTORIL) 15 MG capsule Take 1 capsule (15 mg total) by mouth at bedtime as needed. for sleep    ROS:  Review of Systems  Constitutional: Negative.   HENT: Negative.   Eyes: Negative.   Respiratory: Negative.   Cardiovascular: Negative.   Gastrointestinal: Negative.   Genitourinary: Negative.   Musculoskeletal: Positive for back pain.  Skin: Negative.   Neurological: Negative.   Endo/Heme/Allergies: Negative.   Psychiatric/Behavioral: Negative.   All other systems reviewed and are negative.    Objective:   Today's Vitals: BP (!) 150/70   Pulse 72   Temp (!) 97.2 F (36.2 C) (Temporal)   Resp 15   Ht '5\' 8"'$  (1.727 m)   Wt 148 lb 1.9 oz (67.2 kg)   SpO2 96%   BMI 22.52 kg/m  Vitals with BMI 06/06/2019 01/17/2019 11/02/2018  Height '5\' 8"'$  '5\' 8"'$  '5\' 9"'$   Weight 148 lbs 2 oz 148 lbs 1 oz 149 lbs  BMI 22.53 32.99 24.26  Systolic 834 196 222  Diastolic 70 70 70  Pulse 72 75 76     Physical Exam Vitals and nursing note reviewed.  Constitutional:      Appearance: Normal appearance. He is normal weight.  HENT:     Head: Normocephalic and atraumatic.     Right Ear: External ear normal.     Left Ear: External ear normal.     Mouth/Throat:     Comments: Mask in place  Eyes:     General:        Right eye: No discharge.        Left eye: No discharge.     Conjunctiva/sclera: Conjunctivae normal.  Cardiovascular:     Rate and Rhythm: Normal rate and regular rhythm.     Pulses: Normal pulses.     Heart sounds: Normal heart sounds.  Pulmonary:     Effort: Pulmonary effort is normal.     Breath sounds: Normal breath sounds.  Musculoskeletal:     Cervical back: Normal range of motion and neck  supple.     Comments: Limited ROM in lumbar spine   Skin:    General: Skin is warm.  Neurological:     General: No focal deficit present.     Mental Status: He is alert and oriented to person, place, and time.  Psychiatric:        Mood and Affect: Mood normal.        Behavior: Behavior normal.        Thought Content: Thought content normal.  Judgment: Judgment normal.      Assessment   1. Type 2 diabetes mellitus with vascular disease ([place])   2. Renal artery stenosis ([place])   3. Chronic obstructive pulmonary disease, unspecified COPD type ([place])   4. Renovascular hypertension   5. NICOTINE ADDICTION   6. Encounter for chronic pain management   7. Anemia due to vitamin B12 deficiency, unspecified B12 deficiency type   8. Bilateral low back pain with sciatica, sciatica laterality unspecified, unspecified chronicity     Tests ordered No orders of the defined types were placed in this encounter.    Plan: Please see assessment and plan per problem list above.   No orders of the defined types were placed in this encounter.   Patient to follow-up in 12 weeks.  Perlie Mayo, NP

## 2019-06-06 NOTE — Assessment & Plan Note (Signed)
Asked about quitting: confirms they are currently smokes cigarettes Advise to quit smoking: Educated about QUITTING to reduce the risk of cancer, cardio and cerebrovascular disease. Assess willingness: Unwilling to quit at this time, but is working on cutting back. Assist with counseling and pharmacotherapy: Counseled for 5 minutes and literature provided. Arrange for follow up:  not quitting follow up in 3 months and continue to offer help.   

## 2019-06-06 NOTE — Assessment & Plan Note (Signed)
Donald Everett is encouraged to check blood sugar daily as directed. Continue current medications. Is on statin as well. Educated on importance of maintain a well balanced diabetic friendly diet.  He is reminded the importance of maintaining  good blood sugars,  taking medications as directed, daily foot care, annual eye exams. Additionally educated about keeping good control over blood pressure and cholesterol as well.

## 2019-06-06 NOTE — Assessment & Plan Note (Signed)
Chronic unchanged in nature, current pain management therapy.

## 2019-06-06 NOTE — Assessment & Plan Note (Signed)
Currently takes several BP medications. BP is labile at times. Reports taking all medications as directed.

## 2019-06-06 NOTE — Assessment & Plan Note (Signed)
Significant renal artery stenosis.  Is on several medications to help control blood pressure.  Reports tolerating well.  Suboptimal control labile at times.  Continue DASH diet and exercise as tolerated.

## 2019-06-12 ENCOUNTER — Other Ambulatory Visit: Payer: Self-pay | Admitting: Family Medicine

## 2019-06-24 ENCOUNTER — Other Ambulatory Visit: Payer: Self-pay | Admitting: Family Medicine

## 2019-06-28 ENCOUNTER — Telehealth: Payer: Self-pay | Admitting: Family Medicine

## 2019-06-28 DIAGNOSIS — E1140 Type 2 diabetes mellitus with diabetic neuropathy, unspecified: Secondary | ICD-10-CM

## 2019-06-28 DIAGNOSIS — E1159 Type 2 diabetes mellitus with other circulatory complications: Secondary | ICD-10-CM

## 2019-06-28 DIAGNOSIS — I150 Renovascular hypertension: Secondary | ICD-10-CM

## 2019-06-28 DIAGNOSIS — E785 Hyperlipidemia, unspecified: Secondary | ICD-10-CM

## 2019-06-28 NOTE — Telephone Encounter (Signed)
Pls order and let him know needs fasting lipid, cmp and EGFR and HBA1C and CBC and microalb end June, has a July appointment , they will be due then, thanks

## 2019-07-01 NOTE — Addendum Note (Signed)
Addended by: Abner Greenspan on: 07/01/2019 03:56 PM   Modules accepted: Orders

## 2019-07-04 ENCOUNTER — Other Ambulatory Visit: Payer: Self-pay

## 2019-07-04 ENCOUNTER — Ambulatory Visit (INDEPENDENT_AMBULATORY_CARE_PROVIDER_SITE_OTHER): Payer: PPO

## 2019-07-04 ENCOUNTER — Other Ambulatory Visit (HOSPITAL_COMMUNITY)
Admission: RE | Admit: 2019-07-04 | Discharge: 2019-07-04 | Disposition: A | Discharge status: Home / Self Care | Payer: PPO | Source: Other Acute Inpatient Hospital | Attending: Family Medicine | Admitting: Family Medicine

## 2019-07-04 DIAGNOSIS — Z0289 Encounter for other administrative examinations: Secondary | ICD-10-CM | POA: Diagnosis not present

## 2019-07-04 DIAGNOSIS — E538 Deficiency of other specified B group vitamins: Secondary | ICD-10-CM

## 2019-07-04 MED ORDER — CYANOCOBALAMIN 1000 MCG/ML IJ SOLN
1000.0000 ug | Freq: Once | INTRAMUSCULAR | Status: AC
  Administered 2019-07-04: 1000 ug via INTRAMUSCULAR

## 2019-07-04 NOTE — Progress Notes (Signed)
Monthly b12 given

## 2019-07-08 LAB — MISC LABCORP TEST (SEND OUT): Labcorp test code: 738526

## 2019-07-10 ENCOUNTER — Ambulatory Visit: Payer: PPO | Admitting: Family Medicine

## 2019-07-19 ENCOUNTER — Other Ambulatory Visit: Payer: Self-pay | Admitting: Family Medicine

## 2019-07-21 ENCOUNTER — Other Ambulatory Visit: Payer: Self-pay | Admitting: Family Medicine

## 2019-07-21 MED ORDER — TEMAZEPAM 15 MG PO CAPS
15.0000 mg | ORAL_CAPSULE | Freq: Every evening | ORAL | 5 refills | Status: DC | PRN
Start: 2019-07-21 — End: 2020-01-20

## 2019-07-22 ENCOUNTER — Other Ambulatory Visit: Payer: Self-pay | Admitting: Family Medicine

## 2019-07-22 ENCOUNTER — Telehealth: Payer: Self-pay

## 2019-07-22 MED ORDER — HYDROCODONE-ACETAMINOPHEN 7.5-325 MG PO TABS: ORAL_TABLET | ORAL | 0 refills | Status: AC

## 2019-07-22 NOTE — Telephone Encounter (Signed)
Medication is sent in , please let him know

## 2019-07-22 NOTE — Telephone Encounter (Signed)
What would you advise?

## 2019-07-22 NOTE — Telephone Encounter (Signed)
Pt is calling to get a refill on his pain medication --he was called in 8, and is now out, his appointment is for 7-15

## 2019-07-22 NOTE — Telephone Encounter (Signed)
LVM notifying patient.

## 2019-07-25 ENCOUNTER — Ambulatory Visit: Payer: PPO | Admitting: Family Medicine

## 2019-08-01 ENCOUNTER — Other Ambulatory Visit: Payer: Self-pay

## 2019-08-01 MED ORDER — ONETOUCH DELICA PLUS LANCET33G MISC: 0 refills | Status: DC

## 2019-08-01 MED ORDER — AMLODIPINE BESYLATE 10 MG PO TABS: 10.0000 mg | ORAL_TABLET | Freq: Every day | ORAL | 0 refills | Status: DC

## 2019-08-01 MED ORDER — PRAVASTATIN SODIUM 10 MG PO TABS: 10.0000 mg | ORAL_TABLET | Freq: Every morning | ORAL | 0 refills | Status: DC

## 2019-08-01 MED ORDER — TAMSULOSIN HCL 0.4 MG PO CAPS: 0.4000 mg | ORAL_CAPSULE | Freq: Every day | ORAL | 0 refills | Status: DC

## 2019-08-01 MED ORDER — SURE COMFORT PEN NEEDLES 31G X 8 MM MISC: 0 refills | Status: DC

## 2019-08-01 MED ORDER — GABAPENTIN 300 MG PO CAPS: ORAL_CAPSULE | ORAL | 0 refills | Status: DC

## 2019-08-02 ENCOUNTER — Other Ambulatory Visit: Payer: Self-pay | Admitting: Family Medicine

## 2019-08-05 ENCOUNTER — Telehealth: Payer: Self-pay

## 2019-08-05 NOTE — Telephone Encounter (Signed)
Please call regardng blood work

## 2019-08-05 NOTE — Telephone Encounter (Signed)
Patient wanted to know if he could eat before labs are drawn. Advised him these are fasting labs because a lipid panel is being drawn. He verbalized understanding.

## 2019-08-06 ENCOUNTER — Other Ambulatory Visit: Payer: Self-pay

## 2019-08-06 ENCOUNTER — Ambulatory Visit (INDEPENDENT_AMBULATORY_CARE_PROVIDER_SITE_OTHER): Payer: PPO

## 2019-08-06 DIAGNOSIS — E1140 Type 2 diabetes mellitus with diabetic neuropathy, unspecified: Secondary | ICD-10-CM | POA: Diagnosis not present

## 2019-08-06 DIAGNOSIS — E538 Deficiency of other specified B group vitamins: Secondary | ICD-10-CM

## 2019-08-06 DIAGNOSIS — E785 Hyperlipidemia, unspecified: Secondary | ICD-10-CM | POA: Diagnosis not present

## 2019-08-06 DIAGNOSIS — E1159 Type 2 diabetes mellitus with other circulatory complications: Secondary | ICD-10-CM | POA: Diagnosis not present

## 2019-08-06 DIAGNOSIS — N521 Erectile dysfunction due to diseases classified elsewhere: Secondary | ICD-10-CM | POA: Diagnosis not present

## 2019-08-06 MED ORDER — CYANOCOBALAMIN 1000 MCG/ML IJ SOLN
1000.0000 ug | Freq: Once | INTRAMUSCULAR | Status: AC
  Administered 2019-08-06: 1000 ug via INTRAMUSCULAR

## 2019-08-06 NOTE — Progress Notes (Signed)
B12 injection in left deltoid per patient request.

## 2019-08-09 LAB — COMPLETE METABOLIC PANEL WITH GFR
AG Ratio: 1.3 (calc) (ref 1.0–2.5)
ALT: 11 U/L (ref 9–46)
AST: 17 U/L (ref 10–35)
Albumin: 3.3 g/dL — ABNORMAL LOW (ref 3.6–5.1)
Alkaline phosphatase (APISO): 65 U/L (ref 35–144)
BUN/Creatinine Ratio: 25 (calc) — ABNORMAL HIGH (ref 6–22)
BUN: 39 mg/dL — ABNORMAL HIGH (ref 7–25)
CO2: 25 mmol/L (ref 20–32)
Calcium: 8.9 mg/dL (ref 8.6–10.3)
Chloride: 105 mmol/L (ref 98–110)
Creat: 1.55 mg/dL — ABNORMAL HIGH (ref 0.70–1.18)
GFR, Est African American: 49 mL/min/{1.73_m2} — ABNORMAL LOW (ref >=60)
GFR, Est Non African American: 42 mL/min/{1.73_m2} — ABNORMAL LOW (ref >=60)
Globulin: 2.5 g/dL (calc) (ref 1.9–3.7)
Glucose, Bld: 158 mg/dL — ABNORMAL HIGH (ref 65–99)
Potassium: 4.9 mmol/L (ref 3.5–5.3)
Sodium: 136 mmol/L (ref 135–146)
Total Bilirubin: 0.4 mg/dL (ref 0.2–1.2)
Total Protein: 5.8 g/dL — ABNORMAL LOW (ref 6.1–8.1)

## 2019-08-09 LAB — HEMOGLOBIN A1C
Hgb A1c MFr Bld: 6.7 % of total Hgb — ABNORMAL HIGH (ref <5.7)
Mean Plasma Glucose: 146 (calc)
eAG (mmol/L): 8.1 (calc)

## 2019-08-09 LAB — CBC
HCT: 35.5 % — ABNORMAL LOW (ref 38.5–50.0)
Hemoglobin: 11.7 g/dL — ABNORMAL LOW (ref 13.2–17.1)
MCH: 29.7 pg (ref 27.0–33.0)
MCHC: 33 g/dL (ref 32.0–36.0)
MCV: 90.1 fL (ref 80.0–100.0)
MPV: 11.6 fL (ref 7.5–12.5)
Platelets: 202 10*3/uL (ref 140–400)
RBC: 3.94 10*6/uL — ABNORMAL LOW (ref 4.20–5.80)
RDW: 13.1 % (ref 11.0–15.0)
WBC: 10.5 10*3/uL (ref 3.8–10.8)

## 2019-08-09 LAB — HEPATITIS C ANTIBODY
Hepatitis C Ab: NONREACTIVE
SIGNAL TO CUT-OFF: 0.02 (ref <1.00)

## 2019-08-09 LAB — LIPID PANEL
Cholesterol: 110 mg/dL (ref <200)
HDL: 36 mg/dL — ABNORMAL LOW (ref >=40)
LDL Cholesterol (Calc): 57 mg/dL (calc)
Non-HDL Cholesterol (Calc): 74 mg/dL (calc) (ref <130)
Total CHOL/HDL Ratio: 3.1 (calc) (ref <5.0)
Triglycerides: 85 mg/dL (ref <150)

## 2019-08-09 LAB — TEST AUTHORIZATION

## 2019-08-09 LAB — MICROALBUMIN / CREATININE URINE RATIO
Creatinine, Urine: 47 mg/dL (ref 20–320)
Microalb Creat Ratio: 604 mcg/mg creat — ABNORMAL HIGH (ref <30)
Microalb, Ur: 28.4 mg/dL

## 2019-08-14 ENCOUNTER — Other Ambulatory Visit: Payer: Self-pay | Admitting: Family Medicine

## 2019-08-22 ENCOUNTER — Other Ambulatory Visit: Payer: Self-pay | Admitting: Family Medicine

## 2019-08-29 ENCOUNTER — Ambulatory Visit (INDEPENDENT_AMBULATORY_CARE_PROVIDER_SITE_OTHER): Payer: PPO | Admitting: Family Medicine

## 2019-08-29 ENCOUNTER — Ambulatory Visit: Payer: PPO | Admitting: Family Medicine

## 2019-08-29 ENCOUNTER — Other Ambulatory Visit: Payer: Self-pay

## 2019-08-29 ENCOUNTER — Encounter: Payer: Self-pay | Admitting: Family Medicine

## 2019-08-29 VITALS — BP 168/78 | HR 84 | Ht 68.0 in | Wt 147.0 lb

## 2019-08-29 DIAGNOSIS — E785 Hyperlipidemia, unspecified: Secondary | ICD-10-CM

## 2019-08-29 DIAGNOSIS — F17210 Nicotine dependence, cigarettes, uncomplicated: Secondary | ICD-10-CM | POA: Diagnosis not present

## 2019-08-29 DIAGNOSIS — F419 Anxiety disorder, unspecified: Secondary | ICD-10-CM | POA: Diagnosis not present

## 2019-08-29 DIAGNOSIS — I150 Renovascular hypertension: Secondary | ICD-10-CM | POA: Diagnosis not present

## 2019-08-29 DIAGNOSIS — E1159 Type 2 diabetes mellitus with other circulatory complications: Secondary | ICD-10-CM | POA: Diagnosis not present

## 2019-08-29 DIAGNOSIS — I701 Atherosclerosis of renal artery: Secondary | ICD-10-CM | POA: Diagnosis not present

## 2019-08-29 DIAGNOSIS — F5105 Insomnia due to other mental disorder: Secondary | ICD-10-CM | POA: Diagnosis not present

## 2019-08-29 DIAGNOSIS — F17200 Nicotine dependence, unspecified, uncomplicated: Secondary | ICD-10-CM | POA: Diagnosis not present

## 2019-08-29 DIAGNOSIS — G8929 Other chronic pain: Secondary | ICD-10-CM | POA: Diagnosis not present

## 2019-08-29 MED ORDER — LOSARTAN POTASSIUM 100 MG PO TABS: 100.0000 mg | ORAL_TABLET | Freq: Every day | ORAL | 1 refills | Status: DC

## 2019-08-29 MED ORDER — HYDRALAZINE HCL 50 MG PO TABS: 50.0000 mg | ORAL_TABLET | Freq: Three times a day (TID) | ORAL | 2 refills | Status: DC

## 2019-08-29 MED ORDER — PANTOPRAZOLE SODIUM 40 MG PO TBEC: 40.0000 mg | DELAYED_RELEASE_TABLET | Freq: Every day | ORAL | 1 refills | Status: DC

## 2019-08-29 MED ORDER — AMLODIPINE BESYLATE 10 MG PO TABS: 10.0000 mg | ORAL_TABLET | Freq: Every day | ORAL | 1 refills | Status: DC

## 2019-08-29 MED ORDER — GABAPENTIN 300 MG PO CAPS: ORAL_CAPSULE | ORAL | 1 refills | Status: DC

## 2019-08-29 NOTE — Patient Instructions (Addendum)
F/U in  Office with MD,09/23 or shortly after re evaluate blood pressure Glycohb at visit   Increase hydralazine take one at 7 am , one  At 3 pm and one at 11pm Blood pressure is too high Please reconsider seeing the Cardiologist re circulation and blood pressure    Continue to try to cut back on smoking with a view to quitting  Please reduce oily foods, and fried foods, chees, egg youls and butter, triglycerides were too high  Commit to 64 ounces water daily  NON fasting chem 7 and eGFR 3 to 5 days before next appointment please  Thanks for choosing [facility], we consider it a privelige to serve you.

## 2019-08-31 ENCOUNTER — Encounter: Payer: Self-pay | Admitting: Family Medicine

## 2019-08-31 MED ORDER — HYDROCODONE-ACETAMINOPHEN 7.5-325 MG PO TABS: ORAL_TABLET | ORAL | 0 refills | Status: AC

## 2019-08-31 NOTE — Progress Notes (Signed)
Donald Everett     MRN: [id number]      DOB: 1940/11/19   HPI Donald Everett is here for follow up and re-evaluation of chronic medical conditions, medication management and review of any available recent lab and radiology data.  Preventive health is updated, specifically  Cancer screening and Immunization.   Questions or concerns regarding consultations or procedures which the PT has had in the interim are  addressed. The PT denies any adverse reactions to current medications since the last visit.  There are no new concerns.  There are no specific complaints  Denies polyuria, polydipsia, blurred vision , or hypoglycemic episodes.   ROS Denies recent fever or chills. Denies sinus pressure, nasal congestion, ear pain or sore throat. Denies chest congestion, productive cough or wheezing. Denies chest pains, palpitations and leg swelling Denies abdominal pain, nausea, vomiting,diarrhea or constipation.   Denies dysuria, frequency, hesitancy or incontinence. Chronic  joint pain, swelling and limitation in mobility. Denies headaches, seizures, numbness, or tingling. Denies depression, uncontrolled anxiety or insomnia. Denies skin break down or rash.   PE  BP (!) 168/78   Pulse 84   Ht 5\' 8"  (1.727 m)   Wt 147 lb (66.7 kg)   SpO2 94%   BMI 22.35 kg/m   Patient alert and oriented and in no cardiopulmonary distress.  HEENT: No facial asymmetry, EOMI,     Neck decreased ROM  Chest: Clear to auscultation bilaterally.decreased air entry bilaterally  CVS: S1, S2 no murmurs, no S3.Regular rate.  ABD: Soft non tender.   Ext: No edema   ROM spine, shoulders, hips and knees.  Skin: Intact, no ulcerations or rash noted.  Psych: Good eye contact, normal affect. Memory intact not anxious or depressed appearing.  CNS: CN 2-12 intact, power,  normal throughout.no focal deficits noted.   Assessment & Plan  Renovascular hypertension Uncontrolled , increase  hydrallazine to tid DASH diet and commitment to daily physical activity for a minimum of 30 minutes discussed and encouraged, as a part of hypertension management. The importance of attaining a healthy weight is also discussed.  BP/Weight 08/29/2019 06/06/2019 01/17/2019 11/02/2018 11/01/2018 07/31/2018 07/04/2018  Systolic BP 168 150 142 160 160 140 140  Diastolic BP 78 70 70 70 70 70 70  Wt. (Lbs) 147 148.12 148.04 149 149 148 148  BMI 22.35 22.52 22.51 22 22 21.86 21.86       Insomnia secondary to anxiety Sleep hygiene reviewed and written information offered also. Prescription sent for  medication needed.   Type 2 diabetes mellitus with vascular disease [facility]) Donald Everett is reminded of the importance of commitment to daily physical activity for 30 minutes or more, as able and the need to limit carbohydrate intake to 30 to 60 grams per meal to help with blood sugar control.   The need to take medication as prescribed, test blood sugar as directed, and to call between visits if there is a concern that blood sugar is uncontrolled is also discussed.   Donald Everett is reminded of the importance of daily foot exam, annual eye examination, and good blood sugar, blood pressure and cholesterol control. Recommend reduced dose of insulin Diabetic Labs Latest Ref Rng & Units 08/06/2019 03/05/2019 11/30/2018 08/02/2018 04/24/2018  HbA1c <5.7 % of total Hgb 6.7(H) 7.2(H) 6.7(H) 6.4(H) -  Microalbumin mg/dL 06/24/2018 - - - -  Micro/Creat Ratio <30 mcg/mg creat 604(H) - - - -  Chol <200 mg/dL 38.1 - 017 - -  HDL >  OR = 40 mg/dL 19(Q) - 22(W) - -  Calc LDL mg/dL (calc) 57 - 57 - -  Triglycerides <150 mg/dL 85 - 78 - -  Creatinine 0.70 - 1.18 mg/dL 9.79(G) 9.21(J) 9.41(D) 1.42(H) 1.42(H)   BP/Weight 08/29/2019 06/06/2019 01/17/2019 11/02/2018 11/01/2018 07/31/2018 07/04/2018  Systolic BP 168 150 142 160 160 140 140  Diastolic BP 78 70 70 70 70 70 70  Wt. (Lbs) 147 148.12 148.04 149 149 148 148  BMI 22.35 22.52 22.51  22 22 21.86 21.86   Foot/eye exam completion dates Latest Ref Rng & Units 04/24/2019 11/01/2018  Eye Exam No Retinopathy No Retinopathy -  Foot Form Completion - - Done        Hyperlipidemia LDL goal <100 Hyperlipidemia:Low fat diet discussed and encouraged.   Lipid Panel  Lab Results  Component Value Date   CHOL 110 08/06/2019   HDL 36 (L) 08/06/2019   LDLCALC 57 08/06/2019   TRIG 85 08/06/2019   CHOLHDL 3.1 08/06/2019  Controlled, no change in medication Increased exercise encouraged     NICOTINE ADDICTION Asked:confirms currently smokes cigarettes, 1 PPD Assess: Unwilling to quit but trying to cut back Advise: needs to QUIT to reduce risk of cancer, cardio and cerebrovascular disease Assist: counseled for 5 minutes and literature provided Arrange: follow up in 3 months   Renal artery stenosis Re evaluation by vascular is refused currently by pt, I explain that this is contributing to uncontrolled HTN  Encounter for chronic pain management The patient's Controlled Substance registry is reviewed and compliance confirmed. Adequacy of  Pain control and level of function is assessed. Medication dosing is adjusted as deemed appropriate. Twelve weeks of medication is prescribed , patient signs for the script and is provided with a follow up appointment between 11 to 12 weeks .

## 2019-08-31 NOTE — Assessment & Plan Note (Signed)
Donald Everett is reminded of the importance of commitment to daily physical activity for 30 minutes or more, as able and the need to limit carbohydrate intake to 30 to 60 grams per meal to help with blood sugar control.   The need to take medication as prescribed, test blood sugar as directed, and to call between visits if there is a concern that blood sugar is uncontrolled is also discussed.   Donald Everett is reminded of the importance of daily foot exam, annual eye examination, and good blood sugar, blood pressure and cholesterol control. Recommend reduced dose of insulin Diabetic Labs Latest Ref Rng & Units 08/06/2019 03/05/2019 11/30/2018 08/02/2018 04/24/2018  HbA1c <5.7 % of total Hgb 6.7(H) 7.2(H) 6.7(H) 6.4(H) -  Microalbumin mg/dL 11.9 - - - -  Micro/Creat Ratio <30 mcg/mg creat 604(H) - - - -  Chol <200 mg/dL 147 - 829 - -  HDL > OR = 40 mg/dL 56(O) - 13(Y) - -  Calc LDL mg/dL (calc) 57 - 57 - -  Triglycerides <150 mg/dL 85 - 78 - -  Creatinine 0.70 - 1.18 mg/dL 8.65(H) 8.46(N) 6.29(B) 1.42(H) 1.42(H)   BP/Weight 08/29/2019 06/06/2019 01/17/2019 11/02/2018 11/01/2018 07/31/2018 07/04/2018  Systolic BP 168 150 142 160 160 140 140  Diastolic BP 78 70 70 70 70 70 70  Wt. (Lbs) 147 148.12 148.04 149 149 148 148  BMI 22.35 22.52 22.51 22 22 21.86 21.86   Foot/eye exam completion dates Latest Ref Rng & Units 04/24/2019 11/01/2018  Eye Exam No Retinopathy No Retinopathy -  Foot Form Completion - - Done

## 2019-08-31 NOTE — Assessment & Plan Note (Signed)
Uncontrolled , increase hydrallazine to tid DASH diet and commitment to daily physical activity for a minimum of 30 minutes discussed and encouraged, as a part of hypertension management. The importance of attaining a healthy weight is also discussed.  BP/Weight 08/29/2019 06/06/2019 01/17/2019 11/02/2018 11/01/2018 07/31/2018 07/04/2018  Systolic BP 168 150 142 160 160 140 140  Diastolic BP 78 70 70 70 70 70 70  Wt. (Lbs) 147 148.12 148.04 149 149 148 148  BMI 22.35 22.52 22.51 22 22 21.86 21.86

## 2019-08-31 NOTE — Assessment & Plan Note (Signed)
Re evaluation by vascular is refused currently by pt, I explain that this is contributing to uncontrolled HTN

## 2019-08-31 NOTE — Assessment & Plan Note (Signed)
Asked:confirms currently smokes cigarettes, 1PPD Assess: Unwilling to quit but trying to  cut back Advise: needs to QUIT to reduce risk of cancer, cardio and cerebrovascular disease Assist: counseled for 5 minutes and literature provided Arrange: follow up in 3 months  

## 2019-08-31 NOTE — Assessment & Plan Note (Signed)
Hyperlipidemia:Low fat diet discussed and encouraged.   Lipid Panel  Lab Results  Component Value Date   CHOL 110 08/06/2019   HDL 36 (L) 08/06/2019   LDLCALC 57 08/06/2019   TRIG 85 08/06/2019   CHOLHDL 3.1 08/06/2019  Controlled, no change in medication Increased exercise encouraged

## 2019-08-31 NOTE — Assessment & Plan Note (Signed)
Sleep hygiene reviewed and written information offered also. Prescription sent for  medication needed.  

## 2019-08-31 NOTE — Assessment & Plan Note (Signed)
The patient's Controlled Substance registry is reviewed and compliance confirmed. Adequacy of  Pain control and level of function is assessed. Medication dosing is adjusted as deemed appropriate. Twelve weeks of medication is prescribed , patient signs for the script and is provided with a follow up appointment between 11 to 12 weeks .  

## 2019-09-04 ENCOUNTER — Other Ambulatory Visit: Payer: Self-pay

## 2019-09-04 ENCOUNTER — Ambulatory Visit (INDEPENDENT_AMBULATORY_CARE_PROVIDER_SITE_OTHER): Payer: PPO

## 2019-09-04 DIAGNOSIS — D519 Vitamin B12 deficiency anemia, unspecified: Secondary | ICD-10-CM

## 2019-09-04 MED ORDER — CYANOCOBALAMIN 1000 MCG/ML IJ SOLN
1000.0000 ug | Freq: Once | INTRAMUSCULAR | Status: AC
  Administered 2019-09-04: 1000 ug via INTRAMUSCULAR

## 2019-09-04 NOTE — Progress Notes (Signed)
b12 given in left deltoid. Return in 1 month

## 2019-10-08 ENCOUNTER — Ambulatory Visit (INDEPENDENT_AMBULATORY_CARE_PROVIDER_SITE_OTHER): Payer: PPO

## 2019-10-08 ENCOUNTER — Other Ambulatory Visit: Payer: Self-pay

## 2019-10-08 DIAGNOSIS — D519 Vitamin B12 deficiency anemia, unspecified: Secondary | ICD-10-CM

## 2019-10-08 MED ORDER — CYANOCOBALAMIN 1000 MCG/ML IJ SOLN
1000.0000 ug | Freq: Once | INTRAMUSCULAR | Status: AC
  Administered 2019-10-08: 1000 ug via INTRAMUSCULAR

## 2019-10-29 DIAGNOSIS — E1159 Type 2 diabetes mellitus with other circulatory complications: Secondary | ICD-10-CM | POA: Diagnosis not present

## 2019-10-30 LAB — BMP8+EGFR
BUN/Creatinine Ratio: 18 (ref 10–24)
BUN: 28 mg/dL — ABNORMAL HIGH (ref 8–27)
CO2: 18 mmol/L — ABNORMAL LOW (ref 20–29)
Calcium: 8.8 mg/dL (ref 8.6–10.2)
Chloride: 101 mmol/L (ref 96–106)
Creatinine, Ser: 1.58 mg/dL — ABNORMAL HIGH (ref 0.76–1.27)
GFR calc Af Amer: 47 mL/min/{1.73_m2} — ABNORMAL LOW (ref >59)
GFR calc non Af Amer: 41 mL/min/{1.73_m2} — ABNORMAL LOW (ref >59)
Glucose: 172 mg/dL — ABNORMAL HIGH (ref 65–99)
Potassium: 4.8 mmol/L (ref 3.5–5.2)
Sodium: 132 mmol/L — ABNORMAL LOW (ref 134–144)

## 2019-11-01 ENCOUNTER — Other Ambulatory Visit: Payer: Self-pay | Admitting: Family Medicine

## 2019-11-05 ENCOUNTER — Telehealth: Payer: PPO

## 2019-11-07 ENCOUNTER — Encounter: Payer: Self-pay | Admitting: Family Medicine

## 2019-11-07 ENCOUNTER — Ambulatory Visit: Payer: PPO | Admitting: Family Medicine

## 2019-11-07 ENCOUNTER — Ambulatory Visit (INDEPENDENT_AMBULATORY_CARE_PROVIDER_SITE_OTHER): Payer: PPO | Admitting: Family Medicine

## 2019-11-07 ENCOUNTER — Other Ambulatory Visit: Payer: Self-pay

## 2019-11-07 VITALS — BP 176/78 | HR 78 | Ht 68.0 in | Wt 146.0 lb

## 2019-11-07 DIAGNOSIS — E1159 Type 2 diabetes mellitus with other circulatory complications: Secondary | ICD-10-CM

## 2019-11-07 DIAGNOSIS — G8929 Other chronic pain: Secondary | ICD-10-CM

## 2019-11-07 DIAGNOSIS — F17200 Nicotine dependence, unspecified, uncomplicated: Secondary | ICD-10-CM

## 2019-11-07 DIAGNOSIS — Z0001 Encounter for general adult medical examination with abnormal findings: Secondary | ICD-10-CM | POA: Diagnosis not present

## 2019-11-07 DIAGNOSIS — F17210 Nicotine dependence, cigarettes, uncomplicated: Secondary | ICD-10-CM | POA: Diagnosis not present

## 2019-11-07 DIAGNOSIS — D519 Vitamin B12 deficiency anemia, unspecified: Secondary | ICD-10-CM

## 2019-11-07 DIAGNOSIS — I150 Renovascular hypertension: Secondary | ICD-10-CM | POA: Diagnosis not present

## 2019-11-07 DIAGNOSIS — E1140 Type 2 diabetes mellitus with diabetic neuropathy, unspecified: Secondary | ICD-10-CM

## 2019-11-07 DIAGNOSIS — Z23 Encounter for immunization: Secondary | ICD-10-CM | POA: Diagnosis not present

## 2019-11-07 DIAGNOSIS — N521 Erectile dysfunction due to diseases classified elsewhere: Secondary | ICD-10-CM

## 2019-11-07 DIAGNOSIS — Z0000 Encounter for general adult medical examination without abnormal findings: Secondary | ICD-10-CM

## 2019-11-07 LAB — POCT GLYCOSYLATED HEMOGLOBIN (HGB A1C): Hemoglobin A1C: 6.9 % — AB (ref 4.0–5.6)

## 2019-11-07 MED ORDER — SPIRONOLACTONE 50 MG PO TABS
50.0000 mg | ORAL_TABLET | Freq: Every day | ORAL | 1 refills | Status: DC
Start: 2019-11-07 — End: 2020-05-01

## 2019-11-07 MED ORDER — CYANOCOBALAMIN 1000 MCG/ML IJ SOLN
1000.0000 ug | Freq: Once | INTRAMUSCULAR | Status: AC
  Administered 2019-11-07: 1000 ug via INTRAMUSCULAR

## 2019-11-07 NOTE — Assessment & Plan Note (Signed)
Asked:confirms currently smokes cigarettes Assess: Unwilling to set a quit date, smokes 1PPD Advise: needs to QUIT to reduce risk of cancer, cardio and cerebrovascular disease Assist: counseled for 5 minutes and literature provided Arrange: follow up in 2 to 4 months

## 2019-11-07 NOTE — Assessment & Plan Note (Signed)
unchanged

## 2019-11-07 NOTE — Assessment & Plan Note (Addendum)
Add spironolactone re eval in nurse visit with B 12 in 4 weeks DASH diet and commitment to daily physical activity for a minimum of 30 minutes discussed and encouraged, as a part of hypertension management. The importance of attaining a healthy weight is also discussed.  BP/Weight 11/07/2019 08/29/2019 06/06/2019 01/17/2019 11/02/2018 11/01/2018 07/31/2018  Systolic BP 176 168 150 142 160 160 140  Diastolic BP 78 78 70 70 70 70 70  Wt. (Lbs) 146 147 148.12 148.04 149 149 148  BMI 22.2 22.35 22.52 22.51 22 22 21.86     Advise pt of need to see Cardioogy, he again refuses

## 2019-11-07 NOTE — Patient Instructions (Addendum)
F/u in office with mD first week in janauary call if you need me before  Nurse visit for bP and B 12 injection in 4 weeks  glyco HB today and b12 and flu vaccine today  New additional medication for blood pressure is spironolactone 50 mg one every morning  Please get your covid vaccines next week, it is available at many local pharmacies or the health department  Sincere condolence re your recent loss  Please continue to try to stop smoking  Thanks for choosing [facility], we consider it a privelige to serve you.

## 2019-11-07 NOTE — Progress Notes (Signed)
Donald Everett     MRN: [id number]      DOB: 1940/11/09   HPI: Patient is in for annual physical exam. Chronic pain management and diabetes re evaluation also done.  Blood pressure is uncontrolled, he has severe renal artery stenosis and continues to defer going to cardiology for re evaluation. Recent labs, if available are reviewed. Immunization is reviewed , and  updated if needed.    PE; BP (!) 176/78   Pulse 78   Ht 5\' 8"  (1.727 m)   Wt 146 lb (66.2 kg)   SpO2 93%   BMI 22.20 kg/m   Pleasant male, alert and oriented x 3, blood pressure elevated. Afebrile. HEENT No facial trauma or asymetry. Sinuses non tender. EOMI External ears normal,  Neck: supple, no adenopathy,JVD or thyromegaly.No bruits.  Chest: Clear to ascultation bilaterally.No crackles or wheezes.Decreased air entry throughout Non tender to palpation  Cardiovascular system; Heart sounds normal,  S1 and  S2 ,no S3.  No murmur, or thrill. Apical beat not displaced Peripheral pulses normal.  Abdomen: Soft, non tender,  No guarding, tenderness or rebound.    Musculoskeletal exam: decreased  ROM of spine, adequate in hips , shoulders and knees. No deformity ,swelling or crepitus noted. muscle wastingnoted   Neurologic: Cranial nerves 2 to 12 intact.decreased hearing Power, tone ,normal throughout.decreased sensation infeet No disturbance in gait. No tremor.  Skin: Intact, no ulceration, erythema , scaling or rash noted. Pigmentation normal throughout  Psych; Normal mood and affect. Judgement and concentration normal   Assessment & Plan:  Encounter for chronic pain management The patient's Controlled Substance registry is reviewed and compliance confirmed. Adequacy of  Pain control and level of function is assessed. Medication dosing is adjusted as deemed appropriate. Twelve weeks of medication is prescribed , with a follow up appointment between 11 to 12 weeks .   NICOTINE  ADDICTION Asked:confirms currently smokes cigarettes Assess: Unwilling to set a quit date, smokes 1PPD Advise: needs to QUIT to reduce risk of cancer, cardio and cerebrovascular disease Assist: counseled for 5 minutes and literature provided Arrange: follow up in 2 to 4 months   Type 2 diabetes mellitus with vascular disease [facility]) Donald Everett is reminded of the importance of commitment to daily physical activity for 30 minutes or more, as able and the need to limit carbohydrate intake to 30 to 60 grams per meal to help with blood sugar control.   The need to take medication as prescribed, test blood sugar as directed, and to call between visits if there is a concern that blood sugar is uncontrolled is also discussed.   Donald Everett is reminded of the importance of daily foot exam, annual eye examination, and good blood sugar, blood pressure and cholesterol control.  Diabetic Labs Latest Ref Rng & Units 10/29/2019 08/06/2019 03/05/2019 11/30/2018 08/02/2018  HbA1c <5.7 % of total Hgb - 6.7(H) 7.2(H) 6.7(H) 6.4(H)  Microalbumin mg/dL - 08/04/2018 - - -  Micro/Creat Ratio <30 mcg/mg creat - 604(H) - - -  Chol <200 mg/dL - 45.8 - 099 -  HDL > OR = 40 mg/dL - 833) - 82(N) -  Calc LDL mg/dL (calc) - 57 - 57 -  Triglycerides <150 mg/dL - 85 - 78 -  Creatinine 0.76 - 1.27 mg/dL 05(L) 9.76(B) 3.41(P) 1.36(H) 1.42(H)   BP/Weight 11/07/2019 08/29/2019 06/06/2019 01/17/2019 11/02/2018 11/01/2018 07/31/2018  Systolic BP 176 168 150 142 160 160 140  Diastolic BP 78 78 70 70 70 70 70  Wt. (Lbs) 146 147 148.12 148.04 149 149 148  BMI 22.2 22.35 22.52 22.51 22 22 21.86   Foot/eye exam completion dates Latest Ref Rng & Units 11/07/2019 04/24/2019  Eye Exam No Retinopathy - No Retinopathy  Foot Form Completion - Done -        B12 deficiency anemia IM vit B12 in office today  CKD (chronic kidney disease) stage 3, GFR 30-59 ml/min unchanged  Renovascular hypertension Add spironolactone re eval in nurse visit  with B 12 in 4 weeks DASH diet and commitment to daily physical activity for a minimum of 30 minutes discussed and encouraged, as a part of hypertension management. The importance of attaining a healthy weight is also discussed.  BP/Weight 11/07/2019 08/29/2019 06/06/2019 01/17/2019 11/02/2018 11/01/2018 07/31/2018  Systolic BP 176 168 150 142 160 160 140  Diastolic BP 78 78 70 70 70 70 70  Wt. (Lbs) 146 147 148.12 148.04 149 149 148  BMI 22.2 22.35 22.52 22.51 22 22 21.86     Advise pt of need to see Cardioogy, he again refuses  Annual physical exam Annual exam as documented. Counseling done  re healthy lifestyle involving commitment to 150 minutes exercise per week, heart healthy diet, and attaining healthy weight.The importance of adequate sleep also discussed. Regular seat belt use and home safety, is also discussed. Changes in health habits are decided on by the patient with goals and time frames  set for achieving them. Immunization and cancer screening needs are specifically addressed at this visit.   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   The need to take medication as prescribed, test blood sugar as directed, and to call between visits if there is a concern that blood sugar is uncontrolled is also discussed.   Donald Everett is reminded of the importance of daily foot exam, annual eye examination, and good blood sugar, blood pressure and cholesterol control. Controlled, no change in medication  Diabetic Labs Latest Ref Rng & Units 11/07/2019 10/29/2019 08/06/2019 03/05/2019 11/30/2018  HbA1c 4.0 - 5.6 % 6.9(A) - 6.7(H) 7.2(H) 6.7(H)  Microalbumin mg/dL - - 46.5 - -  Micro/Creat Ratio <30 mcg/mg creat - - 604(H) - -  Chol <200 mg/dL - - 681 -  275  HDL > OR = 40 mg/dL - - 17(G) - 01(V)  Calc LDL mg/dL (calc) - - 57 - 57  Triglycerides <150 mg/dL - - 85 - 78  Creatinine 0.76 - 1.27 mg/dL - 4.94(W) 9.67(R) 9.16(B) 1.36(H)   BP/Weight 11/07/2019 08/29/2019 06/06/2019 01/17/2019 11/02/2018 11/01/2018 07/31/2018  Systolic BP 176 168 150 142 160 160 140  Diastolic BP 78 78 70 70 70 70 70  Wt. (Lbs) 146 147 148.12 148.04 149 149 148  BMI 22.2 22.35 22.52 22.51 22 22 21.86   Foot/eye exam completion dates Latest Ref Rng & Units 11/07/2019 04/24/2019  Eye Exam No Retinopathy - No Retinopathy  Foot Form Completion - Done -

## 2019-11-07 NOTE — Assessment & Plan Note (Signed)
Donald Everett is reminded of the importance of commitment to daily physical activity for 30 minutes or more, as able and the need to limit carbohydrate intake to 30 to 60 grams per meal to help with blood sugar control.   The need to take medication as prescribed, test blood sugar as directed, and to call between visits if there is a concern that blood sugar is uncontrolled is also discussed.   Donald Everett is reminded of the importance of daily foot exam, annual eye examination, and good blood sugar, blood pressure and cholesterol control.  Diabetic Labs Latest Ref Rng & Units 10/29/2019 08/06/2019 03/05/2019 11/30/2018 08/02/2018  HbA1c <5.7 % of total Hgb - 6.7(H) 7.2(H) 6.7(H) 6.4(H)  Microalbumin mg/dL - 75.9 - - -  Micro/Creat Ratio <30 mcg/mg creat - 604(H) - - -  Chol <200 mg/dL - 163 - 846 -  HDL > OR = 40 mg/dL - 65(L) - 93(T) -  Calc LDL mg/dL (calc) - 57 - 57 -  Triglycerides <150 mg/dL - 85 - 78 -  Creatinine 0.76 - 1.27 mg/dL 7.01(X) 7.93(J) 0.30(S) 1.36(H) 1.42(H)   BP/Weight 11/07/2019 08/29/2019 06/06/2019 01/17/2019 11/02/2018 11/01/2018 07/31/2018  Systolic BP 176 168 150 142 160 160 140  Diastolic BP 78 78 70 70 70 70 70  Wt. (Lbs) 146 147 148.12 148.04 149 149 148  BMI 22.2 22.35 22.52 22.51 22 22 21.86   Foot/eye exam completion dates Latest Ref Rng & Units 11/07/2019 04/24/2019  Eye Exam No Retinopathy - No Retinopathy  Foot Form Completion - Done -

## 2019-11-07 NOTE — Assessment & Plan Note (Signed)
The patient's Controlled Substance registry is reviewed and compliance confirmed. Adequacy of  Pain control and level of function is assessed. Medication dosing is adjusted as deemed appropriate. Twelve weeks of medication is prescribed , with a follow up appointment between 11 to 12 weeks .  

## 2019-11-07 NOTE — Assessment & Plan Note (Signed)
IM vit B12 in office today

## 2019-11-10 ENCOUNTER — Encounter: Payer: Self-pay | Admitting: Family Medicine

## 2019-11-10 MED ORDER — HYDROCODONE-ACETAMINOPHEN 7.5-325 MG PO TABS: ORAL_TABLET | ORAL | 0 refills | Status: AC

## 2019-11-10 NOTE — Assessment & Plan Note (Signed)

## 2019-11-10 NOTE — Assessment & Plan Note (Signed)
Donald Everett is reminded of the importance of commitment to daily physical activity for 30 minutes or more, as able and the need to limit carbohydrate intake to 30 to 60 grams per meal to help with blood sugar control.   The need to take medication as prescribed, test blood sugar as directed, and to call between visits if there is a concern that blood sugar is uncontrolled is also discussed.   Donald Everett is reminded of the importance of daily foot exam, annual eye examination, and good blood sugar, blood pressure and cholesterol control. Controlled, no change in medication  Diabetic Labs Latest Ref Rng & Units 11/07/2019 10/29/2019 08/06/2019 03/05/2019 11/30/2018  HbA1c 4.0 - 5.6 % 6.9(A) - 6.7(H) 7.2(H) 6.7(H)  Microalbumin mg/dL - - 43.3 - -  Micro/Creat Ratio <30 mcg/mg creat - - 604(H) - -  Chol <200 mg/dL - - 295 - 188  HDL > OR = 40 mg/dL - - 41(Y) - 60(Y)  Calc LDL mg/dL (calc) - - 57 - 57  Triglycerides <150 mg/dL - - 85 - 78  Creatinine 0.76 - 1.27 mg/dL - 3.01(S) 0.10(X) 3.23(F) 1.36(H)   BP/Weight 11/07/2019 08/29/2019 06/06/2019 01/17/2019 11/02/2018 11/01/2018 07/31/2018  Systolic BP 176 168 150 142 160 160 140  Diastolic BP 78 78 70 70 70 70 70  Wt. (Lbs) 146 147 148.12 148.04 149 149 148  BMI 22.2 22.35 22.52 22.51 22 22 21.86   Foot/eye exam completion dates Latest Ref Rng & Units 11/07/2019 04/24/2019  Eye Exam No Retinopathy - No Retinopathy  Foot Form Completion - Done -

## 2019-12-02 ENCOUNTER — Telehealth (INDEPENDENT_AMBULATORY_CARE_PROVIDER_SITE_OTHER): Payer: PPO

## 2019-12-02 ENCOUNTER — Other Ambulatory Visit: Payer: Self-pay

## 2019-12-02 VITALS — BP 176/78 | HR 78 | Ht 68.0 in | Wt 146.0 lb

## 2019-12-02 DIAGNOSIS — Z0000 Encounter for general adult medical examination without abnormal findings: Secondary | ICD-10-CM

## 2019-12-02 NOTE — Progress Notes (Addendum)
Subjective:   Donald Everett is a 79 y.o. male who presents for Medicare Annual/Subsequent preventive examination.      Objective:    There were no vitals filed for this visit. There is no height or weight on file to calculate BMI.  Advanced Directives 10/30/2017 10/19/2016 01/26/2016 08/28/2015 01/12/2015 01/12/2015 02/25/2013  Does Patient Have a Medical Advance Directive? _0  No Patient would not like information;Patient does not have advance directive  Would patient like information on creating a medical advance directive? Yes (ED - Information included in AVS) No - Patient declined No - Patient declined No - patient declined information Yes - Educational materials given Yes - Educational materials given -    Current Medications (verified) Outpatient Encounter Medications as of 12/02/2019  Medication Sig  . ADVAIR DISKUS 100-50 MCG/DOSE AEPB INHALE 1 INHALATION EVERY 12 HOURS.  Marland Kitchen amLODipine (NORVASC) 10 MG tablet Take 1 tablet (10 mg total) by mouth daily.  Marland Kitchen aspirin EC 325 MG EC tablet Take 1 tablet (325 mg total) by mouth daily.  . B Complex-C (B-COMPLEX WITH VITAMIN C) tablet Take 1 tablet by mouth daily.  . Blood Glucose Monitoring Suppl (ONE TOUCH ULTRA 2) w/Device KIT One touch ultra meter only dx E11.65  . fluticasone (FLONASE) 50 MCG/ACT nasal spray USE 1 SPRAY IN EACH NOSTRIL ONCE DAILY.  Marland Kitchen gabapentin (NEURONTIN) 300 MG capsule Take 1 capsule by mouth twice daily and 2 capsules at bedtime  . hydrALAZINE (APRESOLINE) 50 MG tablet Take 1 tablet (50 mg total) by mouth 3 (three) times daily. Dose increase effective 08/29/2019  . HYDROcodone-acetaminophen (NORCO) 7.5-325 MG tablet TAKE ONE TABLET BY MOUTH TWICE A DAY  . HYDROcodone-acetaminophen (NORCO) 7.5-325 MG tablet Take one tablet by mouth two times daily for pain  . HYDROcodone-acetaminophen (NORCO) 7.5-325 MG tablet Tak one tablet by mouth two times daily for pain  . [START ON 12/21/2019]  HYDROcodone-acetaminophen (NORCO) 7.5-325 MG tablet Take one tablet by mouth two times daily for pain  . [START ON 01/20/2020] HYDROcodone-acetaminophen (NORCO) 7.5-325 MG tablet Take one tablet by mouth two times daily for pain  . JANUVIA 100 MG tablet TAKE ONE TABLET BY MOUTH DAILY.  Marland Kitchen Lancets (ONETOUCH DELICA PLUS MGNOIB70W) MISC USE TO TEST THREE TIMES DAILY.  Marland Kitchen losartan (COZAAR) 100 MG tablet Take 1 tablet (100 mg total) by mouth daily.  . multivitamin-lutein (OCUVITE-LUTEIN) CAPS capsule Take 1 capsule by mouth daily.  Marland Kitchen NOVOLOG MIX 70/30 FLEXPEN (70-30) 100 UNIT/ML FlexPen INJECT 35 UNITS UNDER THE SKIN TWICE DAILY WITH A MEAL.  Marland Kitchen ONETOUCH ULTRA test strip USE TO TEST THREE TIMES DAILY.  . pantoprazole (PROTONIX) 40 MG tablet Take 1 tablet (40 mg total) by mouth daily.  . polyethylene glycol (MIRALAX / GLYCOLAX) packet Take 17 g by mouth daily as needed for mild constipation. Mix with 8 oz of water.  . pravastatin (PRAVACHOL) 10 MG tablet TAKE ONE TABLET BY MOUTH EVERY MORNING.  Marland Kitchen spironolactone (ALDACTONE) 50 MG tablet Take 1 tablet (50 mg total) by mouth daily.  . SURE COMFORT PEN NEEDLES 31G X 8 MM MISC USE AS DIRECTED.  Marland Kitchen tamsulosin (FLOMAX) 0.4 MG CAPS capsule TAKE ONE CAPSULE BY MOUTH DAILY.  Marland Kitchen temazepam (RESTORIL) 15 MG capsule TAKE (1) CAPSULE BY MOUTH AT BEDTIME AS NEEDED.  Marland Kitchen temazepam (RESTORIL) 15 MG capsule Take 1 capsule (15 mg total) by mouth at bedtime as needed for sleep.   No facility-administered encounter medications on file as of  12/02/2019.    Allergies (verified) Patient has no known allergies.   History: Past Medical History:  Diagnosis Date  . Allergy   . Anxiety   . Arthritis    "joints" (02/25/2013)  . Chronic low back pain   . COPD (chronic obstructive pulmonary disease) ([place])   . Depression   . ED (erectile dysfunction)   . ERECTILE DYSFUNCTION 06/20/2007   Qualifier: Diagnosis of  By: Dierdre Harness    . FATIGUE 03/24/2008   Qualifier: Diagnosis of   By: Moshe Cipro MD, Joycelyn Schmid    . GERD (gastroesophageal reflux disease)   . Hyperlipidemia   . Hypertension   . Insomnia   . Nicotine addiction   . Peripheral arterial disease ([place])   . Renal artery stenosis ([place])   . Seasonal allergies 02/05/2012  . Type II diabetes mellitus ([place])   . Unspecified visual loss 11/24/2009   Qualifier: Diagnosis of  By: Moshe Cipro MD, Joycelyn Schmid     Past Surgical History:  Procedure Laterality Date  . COLONOSCOPY    . COLONOSCOPY N/A 08/28/2015   Procedure: COLONOSCOPY;  Surgeon: Rogene Houston, MD;  Location: [facility];  Service: Endoscopy;  Laterality: N/A;  10:15 - moved to 10:45 - Ann to notify pt  . FEMORAL ARTERY STENT Right 02/25/2013  . LOWER EXTREMITY ANGIOGRAM Bilateral 02/25/2013   Procedure: LOWER EXTREMITY ANGIOGRAM;  Surgeon: Lorretta Harp, MD;  Location: [facility] CATH LAB;  Service: Cardiovascular;  Laterality: Bilateral;   Family History  Problem Relation Age of Onset  . Diabetes Mother   . Diabetes Father   . Stroke Father   . Diabetes Sister   . CAD Brother        CABG   Social History   Socioeconomic History  . Marital status: Divorced    Spouse name: Not on file  . Number of children: Not on file  . Years of education: Not on file  . Highest education level: Not on file  Occupational History  . Not on file  Tobacco Use  . Smoking status: Current Every Day Smoker    Packs/day: 1.00    Years: 60.00    Pack years: 60.00    Types: Cigarettes  . Smokeless tobacco: Former Systems developer    Types: [place] date: 10/20/1983  . Tobacco comment: 18  Vaping Use  . Vaping Use: Never used  Substance and Sexual Activity  . Alcohol use: No    Comment: Patient denies  . Drug use: No  . Sexual activity: Never  Other Topics Concern  . Not on file  Social History Narrative  . Not on file   Social Determinants of Health   Financial Resource Strain:   . Difficulty of Paying Living Expenses: Not on file  Food Insecurity:   . Worried  About Charity fundraiser in the Last Year: Not on file  . Ran Out of Food in the Last Year: Not on file  Transportation Needs:   . Lack of Transportation (Medical): Not on file  . Lack of Transportation (Non-Medical): Not on file  Physical Activity:   . Days of Exercise per Week: Not on file  . Minutes of Exercise per Session: Not on file  Stress:   . Feeling of Stress : Not on file  Social Connections:   . Frequency of Communication with Friends and Family: Not on file  . Frequency of Social Gatherings with Friends and Family: Not on file  . Attends Religious  Services: Not on file  . Active Member of Clubs or Organizations: Not on file  . Attends Archivist Meetings: Not on file  . Marital Status: Not on file    Tobacco Counseling Ready to quit: Not Answered Counseling given: Not Answered Comment: 18   Clinical Intake:                 Diabetic? Yes          Activities of Daily Living No flowsheet data found.  Patient Care Team: Fayrene Helper, MD as PCP - General Rehman, Mechele Dawley, MD as Consulting Physician (Gastroenterology) Bettina Gavia, [place] (Optometry)  Indicate any recent Medical Services you may have received from other than [facility] providers in the past year (date may be approximate).     Assessment:   This is a routine wellness examination for Se[place].  Hearing/Vision screen No exam data present  Dietary issues and exercise activities discussed:    Goals    . Exercise 3x per week (30 min per time)     Starting 01/26/2016 patient would like to increase his exercise to 30 minutes a day 3 times a week.    . Quit smoking / using tobacco      Depression Screen PHQ 2/9 Scores 11/02/2018 07/31/2018 01/02/2018 10/30/2017 08/21/2017 08/21/2017 06/01/2017  PHQ - 2 Score 0 0 0 0 0 0 1  PHQ- 9 Score - - - - 1 1 -    Fall Risk Fall Risk  11/07/2019 08/29/2019 06/06/2019 01/17/2019 11/02/2018  Falls in the past year? 0 0 0 0 0  Number falls  in past yr: 0 0 0 0 0  Injury with Fall? 0 0 0 0 0  Risk for fall due to : - - - - -    Any stairs in or around the home? Yes  If so, are there any without handrails? Yes  Home free of loose throw rugs in walkways, pet beds, electrical cords, etc? Yes  Adequate lighting in your home to reduce risk of falls? Yes   ASSISTIVE DEVICES UTILIZED TO PREVENT FALLS:  Life alert? No  Use of a cane, walker or w/c? No  Grab bars in the bathroom? Yes  Shower chair or bench in shower? Yes  Elevated toilet seat or a handicapped toilet? No   TIMED UP AND GO:  Was the test performed? No .      Cognitive Function:     6CIT Screen 11/02/2018 10/30/2017 10/19/2016 01/26/2016  What Year? 0 points 0 points 0 points 0 points  What month? 0 points 0 points 0 points 0 points  What time? 0 points 0 points 0 points 0 points  Count back from 20 0 points - 0 points 0 points  Months in reverse 0 points - 0 points 0 points  Repeat phrase 0 points - 0 points 2 points  Total Score 0 - 0 2    Immunizations Immunization History  Administered Date(s) Administered  . Fluad Quad(high Dose 65+) 11/01/2018, 11/07/2019  . Influenza Split 11/08/2010, 11/11/2011  . Influenza Whole 11/17/2006  . Influenza,inj,Quad PF,6+ Mos 10/29/2012, 11/28/2013, 12/16/2014, 10/14/2015, 11/17/2016, 10/30/2017  . Pneumococcal Conjugate-13 01/22/2014  . Pneumococcal Polysaccharide-23 08/29/2003, 10/07/2010  . Td 08/31/2002  . Tdap 12/16/2014  . Zoster 10/07/2010     TDAP status: Up to date Flu Vaccine status: Up to date Pneumococcal vaccine status: Up to date Covid-19 vaccine status: Declined, Education has been provided regarding the importance of this vaccine  but patient still declined. Advised may receive this vaccine at local pharmacy or Health Dept.or vaccine clinic. Aware to provide a copy of the vaccination record if obtained from local pharmacy or Health Dept. Verbalized acceptance and understanding.  Qualifies for  Shingles Vaccine? Yes   Zostavax completed Yes   Shingrix Completed?: Yes  Screening Tests Health Maintenance  Topic Date Due  . COVID-19 Vaccine (1) Never done  . OPHTHALMOLOGY EXAM  04/23/2020  . HEMOGLOBIN A1C  05/06/2020  . FOOT EXAM  11/06/2020  . TETANUS/TDAP  12/15/2024  . INFLUENZA VACCINE  Completed  . Hepatitis C Screening  Completed  . PNA vac Low Risk Adult  Completed    Health Maintenance  Health Maintenance Due  Topic Date Due  . COVID-19 Vaccine (1) Never done    Colonosopy: 3 years ago.   Lung Cancer Screening: (Low Dose CT Chest recommended if Age 70-80 years, 30 pack-year currently smoking OR have quit w/in 15years.) does qualify.    Additional Screening:  Hepatitis C Screening: does qualify; Completed   Vision Screening: Recommended annual ophthalmology exams for early detection of glaucoma and other disorders of the eye. Is the patient up to date with their annual eye exam?  Yes    Who is the provider or what is the name of the office in which the patient attends annual eye exams? Dr. Gershon Crane   If pt is not established with a provider, would they like to be referred to a provider to establish care? No .   Dental Screening: Recommended annual dental exams for proper oral hygiene  Community Resource Referral / Chronic Care Management: CRR required this visit?  No   CCM required this visit?  No      Plan:     I have personally reviewed and noted the following in the patient's chart:   . Medical and social history . Use of alcohol, tobacco or illicit drugs  . Current medications and supplements . Functional ability and status . Nutritional status . Physical activity . Advanced directives . List of other physicians . Hospitalizations, surgeries, and ER visits in previous 12 months . Vitals . Screenings to include cognitive, depression, and falls . Referrals and appointments  In addition, I have reviewed and discussed with patient  certain preventive protocols, quality metrics, and best practice recommendations. A written personalized care plan for preventive services as well as general preventive health recommendations were provided to patient.     Lonn Georgia, LPN   [id number]   Nurse Notes: AWV performed over the phone with pt in home, and provider in the office.

## 2019-12-06 ENCOUNTER — Ambulatory Visit: Payer: PPO

## 2019-12-06 ENCOUNTER — Ambulatory Visit (INDEPENDENT_AMBULATORY_CARE_PROVIDER_SITE_OTHER): Payer: PPO

## 2019-12-06 ENCOUNTER — Other Ambulatory Visit: Payer: Self-pay

## 2019-12-06 VITALS — BP 175/66

## 2019-12-06 DIAGNOSIS — D519 Vitamin B12 deficiency anemia, unspecified: Secondary | ICD-10-CM

## 2019-12-06 MED ORDER — CYANOCOBALAMIN 1000 MCG/ML IJ SOLN
1000.0000 ug | Freq: Once | INTRAMUSCULAR | Status: AC
  Administered 2019-12-06: 1000 ug via INTRAMUSCULAR

## 2019-12-06 NOTE — Progress Notes (Signed)
Patient came in today for his B12 injection and bp check. BP was 175/66. Patient states at home he has been getting 150-155 systolic. Asked patient if he would wait about 10 mins and let me recheck it. He stated he had to go. He stated maybe his bp med hadn't had time to kick in yet because he slept in a little later this morning.

## 2019-12-09 ENCOUNTER — Ambulatory Visit: Payer: PPO

## 2019-12-23 ENCOUNTER — Other Ambulatory Visit: Payer: Self-pay | Admitting: Family Medicine

## 2020-01-03 ENCOUNTER — Other Ambulatory Visit: Payer: Self-pay

## 2020-01-03 ENCOUNTER — Ambulatory Visit (INDEPENDENT_AMBULATORY_CARE_PROVIDER_SITE_OTHER): Payer: PPO

## 2020-01-03 DIAGNOSIS — D519 Vitamin B12 deficiency anemia, unspecified: Secondary | ICD-10-CM

## 2020-01-03 MED ORDER — CYANOCOBALAMIN 1000 MCG/ML IJ SOLN
1000.0000 ug | Freq: Once | INTRAMUSCULAR | Status: AC
  Administered 2020-01-03: 1000 ug via INTRAMUSCULAR

## 2020-01-20 ENCOUNTER — Other Ambulatory Visit: Payer: Self-pay | Admitting: Family Medicine

## 2020-01-30 ENCOUNTER — Other Ambulatory Visit: Payer: Self-pay | Admitting: Family Medicine

## 2020-01-31 ENCOUNTER — Other Ambulatory Visit: Payer: Self-pay

## 2020-01-31 ENCOUNTER — Ambulatory Visit (INDEPENDENT_AMBULATORY_CARE_PROVIDER_SITE_OTHER): Payer: PPO

## 2020-01-31 DIAGNOSIS — D519 Vitamin B12 deficiency anemia, unspecified: Secondary | ICD-10-CM

## 2020-01-31 MED ORDER — CYANOCOBALAMIN 1000 MCG/ML IJ SOLN
1000.0000 ug | Freq: Once | INTRAMUSCULAR | Status: AC
  Administered 2020-01-31: 1000 ug via INTRAMUSCULAR

## 2020-02-17 ENCOUNTER — Encounter: Payer: Self-pay | Admitting: Family Medicine

## 2020-02-17 ENCOUNTER — Other Ambulatory Visit: Payer: Self-pay

## 2020-02-17 ENCOUNTER — Telehealth (INDEPENDENT_AMBULATORY_CARE_PROVIDER_SITE_OTHER): Payer: PPO | Admitting: Family Medicine

## 2020-02-17 VITALS — BP 165/68

## 2020-02-17 DIAGNOSIS — E1159 Type 2 diabetes mellitus with other circulatory complications: Secondary | ICD-10-CM | POA: Diagnosis not present

## 2020-02-17 DIAGNOSIS — F419 Anxiety disorder, unspecified: Secondary | ICD-10-CM | POA: Diagnosis not present

## 2020-02-17 DIAGNOSIS — E559 Vitamin D deficiency, unspecified: Secondary | ICD-10-CM | POA: Diagnosis not present

## 2020-02-17 DIAGNOSIS — F17200 Nicotine dependence, unspecified, uncomplicated: Secondary | ICD-10-CM | POA: Diagnosis not present

## 2020-02-17 DIAGNOSIS — M5440 Lumbago with sciatica, unspecified side: Secondary | ICD-10-CM | POA: Diagnosis not present

## 2020-02-17 DIAGNOSIS — F5105 Insomnia due to other mental disorder: Secondary | ICD-10-CM

## 2020-02-17 DIAGNOSIS — I150 Renovascular hypertension: Secondary | ICD-10-CM | POA: Diagnosis not present

## 2020-02-17 DIAGNOSIS — E785 Hyperlipidemia, unspecified: Secondary | ICD-10-CM | POA: Diagnosis not present

## 2020-02-17 DIAGNOSIS — G8929 Other chronic pain: Secondary | ICD-10-CM

## 2020-02-17 MED ORDER — HYDROCODONE-ACETAMINOPHEN 7.5-325 MG PO TABS: ORAL_TABLET | ORAL | 0 refills | Status: AC

## 2020-02-17 MED ORDER — HYDROCODONE-ACETAMINOPHEN 7.5-325 MG PO TABS
ORAL_TABLET | ORAL | 0 refills | Status: AC
Start: 2020-04-19 — End: 2020-05-19

## 2020-02-17 NOTE — Assessment & Plan Note (Signed)
Likely uncontrolled, nurse BP check in next several weeks, may need dose adjustment  DASH diet and commitment to daily physical activity for a minimum of 30 minutes discussed and encouraged, as a part of hypertension management. The importance of attaining a healthy weight is also discussed.  BP/Weight 02/17/2020 12/06/2019 12/02/2019 11/07/2019 08/29/2019 06/06/2019 01/17/2019  Systolic BP 165 175 176 176 168 150 142  Diastolic BP 68 66 78 78 78 70 70  Wt. (Lbs) - - 146 146 147 148.12 148.04  BMI - - 22.2 22.2 22.35 22.52 22.51

## 2020-02-17 NOTE — Assessment & Plan Note (Signed)
Chronic and unchanged, continue chronic pain management as before 

## 2020-02-17 NOTE — Assessment & Plan Note (Signed)
Asked:confirms currently smokes cigarettes approx 18/day Assess: Unwilling to set a quit date, but is cutting back Advise: needs to QUIT to reduce risk of cancer, cardio and cerebrovascular disease Assist: counseled for 5 minutes and literature provided Arrange: follow up in 2 to 4 months

## 2020-02-17 NOTE — Assessment & Plan Note (Signed)
Updated lab needed at/ before next visit. Donald Everett is reminded of the importance of commitment to daily physical activity for 30 minutes or more, as able and the need to limit carbohydrate intake to 30 to 60 grams per meal to help with blood sugar control.   The need to take medication as prescribed, test blood sugar as directed, and to call between visits if there is a concern that blood sugar is uncontrolled is also discussed.   Donald Everett is reminded of the importance of daily foot exam, annual eye examination, and good blood sugar, blood pressure and cholesterol control. May need to reduce insulin dose  Diabetic Labs Latest Ref Rng & Units 11/07/2019 10/29/2019 08/06/2019 03/05/2019 11/30/2018  HbA1c 4.0 - 5.6 % 6.9(A) - 6.7(H) 7.2(H) 6.7(H)  Microalbumin mg/dL - - 95.6 - -  Micro/Creat Ratio <30 mcg/mg creat - - 604(H) - -  Chol <200 mg/dL - - 213 - 086  HDL > OR = 40 mg/dL - - 57(Q) - 46(N)  Calc LDL mg/dL (calc) - - 57 - 57  Triglycerides <150 mg/dL - - 85 - 78  Creatinine 0.76 - 1.27 mg/dL - 6.29(B) 2.84(X) 3.24(M) 1.36(H)   BP/Weight 02/17/2020 12/06/2019 12/02/2019 11/07/2019 08/29/2019 06/06/2019 01/17/2019  Systolic BP 165 175 176 176 168 150 142  Diastolic BP 68 66 78 78 78 70 70  Wt. (Lbs) - - 146 146 147 148.12 148.04  BMI - - 22.2 22.2 22.35 22.52 22.51   Foot/eye exam completion dates Latest Ref Rng & Units 11/07/2019 04/24/2019  Eye Exam No Retinopathy - No Retinopathy  Foot Form Completion - Done -

## 2020-02-17 NOTE — Patient Instructions (Signed)
Nurse BP check on the same day as he gets his VIT b12 injection, I will likely adjust BP med at that time  Please get fasting lipid, cmp and EGFr, HBA1C, tSH and vit D same day as Nurse visit, may be reducing the dose of insulin  Now smoking 18 to 20 ciggs/ day, continue to work on cutting back with a view to quitting  Continue to work on getting your covid vaccines as we have discussed  Medications needed are prescribed  Be careful not to fall, ensure adequate lighting and de clutter your home  Thanks for choosing [facility], we consider it a privelige to serve you.   Best for 2022!

## 2020-02-17 NOTE — Assessment & Plan Note (Signed)
Sleep hygiene reviewed and written information offered also. Prescription sent for  medication needed.  

## 2020-02-17 NOTE — Progress Notes (Signed)
Virtual Visit via Telephone Note  I connected with Donald Everett on 02/17/20 at  1:20 PM EST by telephone and verified that I am speaking with the correct person using two identifiers.  Location: Patient: home Provider: office   I discussed the limitations, risks, security and privacy concerns of performing an evaluation and management service by telephone and the availability of in person appointments. I also discussed with the patient that there may be a patient responsible charge related to this service. The patient expressed understanding and agreed to proceed.   History of Present Illness: F/U chronic problems. Blood pressure still high when I check it at home Several sugar lows when I do not snack at bedtime Denies recent fever or chills. Denies sinus pressure, nasal congestion, ear pain or sore throat. Denies chest congestion, productive cough or wheezing. Denies chest pains, palpitations and leg swelling Denies abdominal pain, nausea, vomiting,diarrhea or constipation.   Denies dysuria, frequency, hesitancy or incontinence. Denies uncontrolled  joint pain, swelling and limitation in mobility. Denies headaches, seizures,c/o lower extremity  numbness, and  tingling. Denies depression, anxiety or uncontrolled insomnia. Denies skin break down or rash.       Observations/Objective: BP (!) 165/68  Good communication with no confusion and intact memory. 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 The importance of attaining a healthy weight is also discussed.  BP/Weight 02/17/2020 12/06/2019 12/02/2019 11/07/2019 08/29/2019 06/06/2019 01/17/2019  Systolic BP 165 175 176 176  168 150 142  Diastolic BP 68 66 78 78 78 70 70  Wt. (Lbs) - - 146 146 147 148.12 148.04  BMI - - 22.2 22.2 22.35 22.52 22.51       Type 2 diabetes mellitus with vascular disease (HCC) Updated lab needed at/ before next visit. [name] is reminded of the importance of commitment to daily physical activity for 30 minutes or more, as able and the need to limit carbohydrate intake to 30 to 60 grams per meal to help with blood sugar control.   The need to take medication as prescribed, test blood sugar as directed, and to call between visits if there is a concern that blood sugar is uncontrolled is also discussed.   [name] is reminded of the importance of daily foot exam, annual eye examination, and good blood sugar, blood pressure and cholesterol control. May need to reduce insulin dose  Diabetic Labs Latest Ref Rng & Units 11/07/2019 10/29/2019 08/06/2019 03/05/2019 11/30/2018  HbA1c 4.0 - 5.6 % 6.9(A) - 6.7(H) 7.2(H) 6.7(H)  Microalbumin mg/dL - - 62.8 - -  Micro/Creat Ratio <30 mcg/mg creat - - 604(H) - -  Chol <200 mg/dL - - 315 - 176  HDL > OR = 40 mg/dL - - 16(W) - 73(X)  Calc LDL mg/dL (calc) - - 57 - 57  Triglycerides <150 mg/dL - - 85 - 78  Creatinine 0.76 - 1.27 mg/dL - 1.06(Y) 6.94(W) 5.46(E) 1.36(H)   BP/Weight 02/17/2020 12/06/2019 12/02/2019 11/07/2019 08/29/2019 06/06/2019 01/17/2019  Systolic BP 165 175 176 176 168 150 142  Diastolic BP 68 66 78 78 78 70 70  Wt. (Lbs) - - 146 146 147 148.12 148.04  BMI - - 22.2 22.2 22.35 22.52 22.51  Foot/eye exam completion dates Latest Ref Rng & Units 11/07/2019 04/24/2019  Eye Exam No Retinopathy - No Retinopathy  Foot Form Completion - Done -        NICOTINE ADDICTION Asked:confirms currently smokes cigarettes approx 18/day Assess: Unwilling to set a quit date, but is cutting back Advise: needs to QUIT to reduce risk of cancer, cardio and cerebrovascular disease Assist: counseled for 5 minutes and literature  provided Arrange: follow up in 2 to 4 months   Insomnia secondary to anxiety Sleep hygiene reviewed and written information offered also. Prescription sent for  medication needed.   LOW BACK PAIN, CHRONIC Chronic and unchanged, continue chronic pain management as before  Encounter for chronic pain management The patient's Controlled Substance registry is reviewed and compliance confirmed. Adequacy of  Pain control and level of function is assessed. Medication dosing is adjusted as deemed appropriate. Twelve weeks of medication is prescribed , with a follow up appointment between 11 to 12 weeks .    Follow Up Instructions:    I discussed the assessment and treatment plan with the patient. The patient was provided an opportunity to ask questions and all were answered. The patient agreed with the plan and demonstrated an understanding of the instructions.   The patient was advised to call back or seek an in-person evaluation if the symptoms worsen or if the condition fails to improve as anticipated.  I provided 30 minutes of non-face-to-face time during this encounter.   Donald Overman, MD

## 2020-02-17 NOTE — Assessment & Plan Note (Signed)
The patient's Controlled Substance registry is reviewed and compliance confirmed. Adequacy of  Pain control and level of function is assessed. Medication dosing is adjusted as deemed appropriate. Twelve weeks of medication is prescribed , with a follow up appointment between 11 to 12 weeks .  

## 2020-02-24 ENCOUNTER — Other Ambulatory Visit: Payer: Self-pay | Admitting: Family Medicine

## 2020-02-24 MED ORDER — TEMAZEPAM 15 MG PO CAPS: 15.0000 mg | ORAL_CAPSULE | Freq: Every evening | ORAL | 3 refills | Status: DC | PRN

## 2020-03-02 ENCOUNTER — Ambulatory Visit: Payer: PPO

## 2020-03-16 ENCOUNTER — Ambulatory Visit (INDEPENDENT_AMBULATORY_CARE_PROVIDER_SITE_OTHER): Payer: PPO

## 2020-03-16 ENCOUNTER — Other Ambulatory Visit: Payer: Self-pay

## 2020-03-16 VITALS — BP 183/70

## 2020-03-16 DIAGNOSIS — E559 Vitamin D deficiency, unspecified: Secondary | ICD-10-CM | POA: Diagnosis not present

## 2020-03-16 DIAGNOSIS — E1159 Type 2 diabetes mellitus with other circulatory complications: Secondary | ICD-10-CM | POA: Diagnosis not present

## 2020-03-16 DIAGNOSIS — D519 Vitamin B12 deficiency anemia, unspecified: Secondary | ICD-10-CM | POA: Diagnosis not present

## 2020-03-16 DIAGNOSIS — E785 Hyperlipidemia, unspecified: Secondary | ICD-10-CM | POA: Diagnosis not present

## 2020-03-16 DIAGNOSIS — R7989 Other specified abnormal findings of blood chemistry: Secondary | ICD-10-CM | POA: Diagnosis not present

## 2020-03-16 MED ORDER — CYANOCOBALAMIN 1000 MCG/ML IJ SOLN
1000.0000 ug | Freq: Once | INTRAMUSCULAR | Status: AC
  Administered 2020-03-16: 1000 ug via INTRAMUSCULAR

## 2020-03-16 NOTE — Progress Notes (Signed)
In for monthly b12 injection

## 2020-03-16 NOTE — Patient Instructions (Signed)
Return in 1 month for b12 injection

## 2020-03-17 ENCOUNTER — Other Ambulatory Visit: Payer: Self-pay | Admitting: Family Medicine

## 2020-03-17 LAB — CMP14+EGFR
ALT: 12 IU/L (ref 0–44)
AST: 23 IU/L (ref 0–40)
Albumin/Globulin Ratio: 1.1 — ABNORMAL LOW (ref 1.2–2.2)
Albumin: 3.2 g/dL — ABNORMAL LOW (ref 3.7–4.7)
Alkaline Phosphatase: 86 IU/L (ref 44–121)
BUN/Creatinine Ratio: 19 (ref 10–24)
BUN: 32 mg/dL — ABNORMAL HIGH (ref 8–27)
Bilirubin Total: 0.4 mg/dL (ref 0.0–1.2)
CO2: 17 mmol/L — ABNORMAL LOW (ref 20–29)
Calcium: 9.2 mg/dL (ref 8.6–10.2)
Chloride: 101 mmol/L (ref 96–106)
Creatinine, Ser: 1.71 mg/dL — ABNORMAL HIGH (ref 0.76–1.27)
GFR calc Af Amer: 43 mL/min/{1.73_m2} — ABNORMAL LOW (ref >59)
GFR calc non Af Amer: 37 mL/min/{1.73_m2} — ABNORMAL LOW (ref >59)
Globulin, Total: 2.8 g/dL (ref 1.5–4.5)
Glucose: 182 mg/dL — ABNORMAL HIGH (ref 65–99)
Potassium: 6.5 mmol/L — ABNORMAL HIGH (ref 3.5–5.2)
Sodium: 133 mmol/L — ABNORMAL LOW (ref 134–144)
Total Protein: 6 g/dL (ref 6.0–8.5)

## 2020-03-17 LAB — HEMOGLOBIN A1C
Est. average glucose Bld gHb Est-mCnc: 143 mg/dL
Hgb A1c MFr Bld: 6.6 % — ABNORMAL HIGH (ref 4.8–5.6)

## 2020-03-17 LAB — LIPID PANEL
Chol/HDL Ratio: 2.7 ratio (ref 0.0–5.0)
Cholesterol, Total: 108 mg/dL (ref 100–199)
HDL: 40 mg/dL (ref >39)
LDL Chol Calc (NIH): 52 mg/dL (ref 0–99)
Triglycerides: 79 mg/dL (ref 0–149)
VLDL Cholesterol Cal: 16 mg/dL (ref 5–40)

## 2020-03-17 LAB — VITAMIN D 25 HYDROXY (VIT D DEFICIENCY, FRACTURES): Vit D, 25-Hydroxy: 53.8 ng/mL (ref 30.0–100.0)

## 2020-03-17 LAB — TSH: TSH: 5.85 u[IU]/mL — ABNORMAL HIGH (ref 0.450–4.500)

## 2020-03-17 MED ORDER — SODIUM POLYSTYRENE SULFONATE 15 GM/60ML PO SUSP: ORAL | 0 refills | Status: DC

## 2020-03-19 ENCOUNTER — Telehealth: Payer: Self-pay

## 2020-03-19 ENCOUNTER — Other Ambulatory Visit: Payer: Self-pay

## 2020-03-19 DIAGNOSIS — E1159 Type 2 diabetes mellitus with other circulatory complications: Secondary | ICD-10-CM

## 2020-03-19 DIAGNOSIS — E875 Hyperkalemia: Secondary | ICD-10-CM

## 2020-03-19 MED ORDER — SODIUM POLYSTYRENE SULFONATE 15 GM/60ML PO SUSP: ORAL | 0 refills | Status: DC

## 2020-03-19 NOTE — Telephone Encounter (Signed)
-----  Message from Fayrene Helper, MD sent at 03/17/2020  6:44 AM EST ----- Potassium high, needs kayexalate today rept non fast chem 7 and EGFR and visit with Dr Posey Pronto re uncontrolled blood pressure this Wednesday or Thursday preferably If taking novolog mix 35 units twice daily, this needs to be reduced to 30 units twice daily,please send a message verifying the dose he is taking, I will change in med list I do recommend nephrology consult again as he has chronic kidney disease , please let me know if he agrees Needs appointment with me in March, pain contract agreement and medication management  A lot here

## 2020-03-20 LAB — TSH+FREE T4
Free T4: 1.21 ng/dL (ref 0.82–1.77)
TSH: 5.85 u[IU]/mL — ABNORMAL HIGH (ref 0.450–4.500)

## 2020-03-20 LAB — T3, FREE: T3, Free: 2.7 pg/mL (ref 2.0–4.4)

## 2020-03-20 LAB — SPECIMEN STATUS REPORT

## 2020-03-24 DIAGNOSIS — E875 Hyperkalemia: Secondary | ICD-10-CM | POA: Diagnosis not present

## 2020-03-25 ENCOUNTER — Other Ambulatory Visit: Payer: Self-pay | Admitting: Family Medicine

## 2020-03-25 ENCOUNTER — Other Ambulatory Visit (INDEPENDENT_AMBULATORY_CARE_PROVIDER_SITE_OTHER): Payer: Self-pay

## 2020-03-25 DIAGNOSIS — E875 Hyperkalemia: Secondary | ICD-10-CM

## 2020-03-25 LAB — BMP8+EGFR
BUN/Creatinine Ratio: 18 (ref 10–24)
BUN: 31 mg/dL — ABNORMAL HIGH (ref 8–27)
CO2: 18 mmol/L — ABNORMAL LOW (ref 20–29)
Calcium: 8.8 mg/dL (ref 8.6–10.2)
Chloride: 101 mmol/L (ref 96–106)
Creatinine, Ser: 1.73 mg/dL — ABNORMAL HIGH (ref 0.76–1.27)
GFR calc Af Amer: 42 mL/min/{1.73_m2} — ABNORMAL LOW (ref >59)
GFR calc non Af Amer: 37 mL/min/{1.73_m2} — ABNORMAL LOW (ref >59)
Glucose: 217 mg/dL — ABNORMAL HIGH (ref 65–99)
Potassium: 5.9 mmol/L — ABNORMAL HIGH (ref 3.5–5.2)
Sodium: 131 mmol/L — ABNORMAL LOW (ref 134–144)

## 2020-03-25 MED ORDER — SODIUM POLYSTYRENE SULFONATE 15 GM/60ML PO SUSP: ORAL | 0 refills | Status: DC

## 2020-04-02 ENCOUNTER — Other Ambulatory Visit: Payer: Self-pay

## 2020-04-02 DIAGNOSIS — E875 Hyperkalemia: Secondary | ICD-10-CM

## 2020-04-03 DIAGNOSIS — E875 Hyperkalemia: Secondary | ICD-10-CM | POA: Diagnosis not present

## 2020-04-04 LAB — BMP8+EGFR
BUN/Creatinine Ratio: 18 (ref 10–24)
BUN: 31 mg/dL — ABNORMAL HIGH (ref 8–27)
CO2: 16 mmol/L — ABNORMAL LOW (ref 20–29)
Calcium: 8.8 mg/dL (ref 8.6–10.2)
Chloride: 99 mmol/L (ref 96–106)
Creatinine, Ser: 1.71 mg/dL — ABNORMAL HIGH (ref 0.76–1.27)
GFR calc Af Amer: 43 mL/min/{1.73_m2} — ABNORMAL LOW (ref >59)
GFR calc non Af Amer: 37 mL/min/{1.73_m2} — ABNORMAL LOW (ref >59)
Glucose: 168 mg/dL — ABNORMAL HIGH (ref 65–99)
Potassium: 5.5 mmol/L — ABNORMAL HIGH (ref 3.5–5.2)
Sodium: 131 mmol/L — ABNORMAL LOW (ref 134–144)

## 2020-04-06 ENCOUNTER — Other Ambulatory Visit: Payer: Self-pay

## 2020-04-06 MED ORDER — SODIUM POLYSTYRENE SULFONATE 15 GM/60ML PO SUSP: ORAL | 0 refills | Status: DC

## 2020-04-13 ENCOUNTER — Other Ambulatory Visit: Payer: Self-pay

## 2020-04-13 ENCOUNTER — Ambulatory Visit (INDEPENDENT_AMBULATORY_CARE_PROVIDER_SITE_OTHER): Payer: PPO

## 2020-04-13 DIAGNOSIS — D519 Vitamin B12 deficiency anemia, unspecified: Secondary | ICD-10-CM | POA: Diagnosis not present

## 2020-04-13 MED ORDER — CYANOCOBALAMIN 1000 MCG/ML IJ SOLN
1000.0000 ug | Freq: Once | INTRAMUSCULAR | Status: AC
  Administered 2020-04-13: 1000 ug via INTRAMUSCULAR

## 2020-04-21 ENCOUNTER — Other Ambulatory Visit: Payer: Self-pay | Admitting: Family Medicine

## 2020-04-23 DIAGNOSIS — H25013 Cortical age-related cataract, bilateral: Secondary | ICD-10-CM | POA: Diagnosis not present

## 2020-04-23 DIAGNOSIS — H5203 Hypermetropia, bilateral: Secondary | ICD-10-CM | POA: Diagnosis not present

## 2020-04-23 DIAGNOSIS — E119 Type 2 diabetes mellitus without complications: Secondary | ICD-10-CM | POA: Diagnosis not present

## 2020-04-23 DIAGNOSIS — H524 Presbyopia: Secondary | ICD-10-CM | POA: Diagnosis not present

## 2020-04-23 DIAGNOSIS — H2513 Age-related nuclear cataract, bilateral: Secondary | ICD-10-CM | POA: Diagnosis not present

## 2020-04-23 DIAGNOSIS — Z794 Long term (current) use of insulin: Secondary | ICD-10-CM | POA: Diagnosis not present

## 2020-04-23 DIAGNOSIS — H52203 Unspecified astigmatism, bilateral: Secondary | ICD-10-CM | POA: Diagnosis not present

## 2020-04-23 LAB — HM DIABETES EYE EXAM

## 2020-05-01 ENCOUNTER — Other Ambulatory Visit: Payer: Self-pay | Admitting: Family Medicine

## 2020-05-14 ENCOUNTER — Ambulatory Visit: Payer: PPO | Admitting: Family Medicine

## 2020-05-26 ENCOUNTER — Other Ambulatory Visit: Payer: Self-pay | Admitting: Family Medicine

## 2020-06-01 ENCOUNTER — Other Ambulatory Visit: Payer: Self-pay

## 2020-06-01 ENCOUNTER — Ambulatory Visit (INDEPENDENT_AMBULATORY_CARE_PROVIDER_SITE_OTHER): Payer: PPO | Admitting: Family Medicine

## 2020-06-01 ENCOUNTER — Encounter: Payer: Self-pay | Admitting: Family Medicine

## 2020-06-01 VITALS — BP 203/77 | HR 81 | Resp 15 | Ht 68.0 in | Wt 141.0 lb

## 2020-06-01 DIAGNOSIS — E538 Deficiency of other specified B group vitamins: Secondary | ICD-10-CM | POA: Diagnosis not present

## 2020-06-01 DIAGNOSIS — D519 Vitamin B12 deficiency anemia, unspecified: Secondary | ICD-10-CM | POA: Diagnosis not present

## 2020-06-01 DIAGNOSIS — J449 Chronic obstructive pulmonary disease, unspecified: Secondary | ICD-10-CM | POA: Diagnosis not present

## 2020-06-01 DIAGNOSIS — M5440 Lumbago with sciatica, unspecified side: Secondary | ICD-10-CM

## 2020-06-01 DIAGNOSIS — I701 Atherosclerosis of renal artery: Secondary | ICD-10-CM | POA: Diagnosis not present

## 2020-06-01 DIAGNOSIS — E875 Hyperkalemia: Secondary | ICD-10-CM | POA: Diagnosis not present

## 2020-06-01 DIAGNOSIS — E1159 Type 2 diabetes mellitus with other circulatory complications: Secondary | ICD-10-CM | POA: Diagnosis not present

## 2020-06-01 DIAGNOSIS — G8929 Other chronic pain: Secondary | ICD-10-CM

## 2020-06-01 DIAGNOSIS — F419 Anxiety disorder, unspecified: Secondary | ICD-10-CM

## 2020-06-01 DIAGNOSIS — E785 Hyperlipidemia, unspecified: Secondary | ICD-10-CM

## 2020-06-01 DIAGNOSIS — F5105 Insomnia due to other mental disorder: Secondary | ICD-10-CM | POA: Diagnosis not present

## 2020-06-01 DIAGNOSIS — F17200 Nicotine dependence, unspecified, uncomplicated: Secondary | ICD-10-CM

## 2020-06-01 MED ORDER — PRAVASTATIN SODIUM 10 MG PO TABS: 10.0000 mg | ORAL_TABLET | Freq: Every morning | ORAL | 2 refills | Status: AC

## 2020-06-01 MED ORDER — HYDROCODONE-ACETAMINOPHEN 7.5-325 MG PO TABS: ORAL_TABLET | ORAL | 0 refills | Status: AC

## 2020-06-01 MED ORDER — GABAPENTIN 300 MG PO CAPS: ORAL_CAPSULE | ORAL | 2 refills | Status: AC

## 2020-06-01 MED ORDER — AMLODIPINE BESYLATE 2.5 MG PO TABS: ORAL_TABLET | ORAL | 3 refills | Status: AC

## 2020-06-01 MED ORDER — AMLODIPINE BESYLATE 10 MG PO TABS: 1.0000 | ORAL_TABLET | Freq: Every day | ORAL | 2 refills | Status: AC

## 2020-06-01 MED ORDER — HYDRALAZINE HCL 50 MG PO TABS: ORAL_TABLET | ORAL | 5 refills | Status: DC

## 2020-06-01 MED ORDER — TAMSULOSIN HCL 0.4 MG PO CAPS: 0.4000 mg | ORAL_CAPSULE | Freq: Every day | ORAL | 2 refills | Status: AC

## 2020-06-01 MED ORDER — PANTOPRAZOLE SODIUM 40 MG PO TBEC: 40.0000 mg | DELAYED_RELEASE_TABLET | Freq: Every day | ORAL | 2 refills | Status: AC

## 2020-06-01 MED ORDER — SITAGLIPTIN PHOSPHATE 100 MG PO TABS: 100.0000 mg | ORAL_TABLET | Freq: Every day | ORAL | 2 refills | Status: DC

## 2020-06-01 MED ORDER — SPIRONOLACTONE 50 MG PO TABS: 50.0000 mg | ORAL_TABLET | Freq: Every day | ORAL | 2 refills | Status: AC

## 2020-06-01 MED ORDER — CYANOCOBALAMIN 1000 MCG/ML IJ SOLN
1000.0000 ug | Freq: Once | INTRAMUSCULAR | Status: AC
  Administered 2020-06-01: 1000 ug via INTRAMUSCULAR

## 2020-06-01 NOTE — Patient Instructions (Addendum)
F/U in 11 weeks, call if you need me before  Lab today, chem 7 and EGFr  B12 injection today Please work on cutting back smoking with a view to quitting, this will help your breathing, and your b;lood pressure  New additional dose of amlodipine 2.5 mg one every evening at 6 for your blood pressure, continue all your current BP meds including he 10 mg tablet one every morning  Be careful not to fall  Vital you take all eds every day as prescribed

## 2020-06-02 ENCOUNTER — Telehealth: Payer: Self-pay

## 2020-06-02 ENCOUNTER — Encounter: Payer: Self-pay | Admitting: Family Medicine

## 2020-06-02 ENCOUNTER — Other Ambulatory Visit: Payer: Self-pay

## 2020-06-02 ENCOUNTER — Other Ambulatory Visit: Payer: Self-pay | Admitting: Family Medicine

## 2020-06-02 DIAGNOSIS — E1159 Type 2 diabetes mellitus with other circulatory complications: Secondary | ICD-10-CM

## 2020-06-02 DIAGNOSIS — E875 Hyperkalemia: Secondary | ICD-10-CM

## 2020-06-02 MED ORDER — SODIUM POLYSTYRENE SULFONATE PO POWD: ORAL | 1 refills | Status: AC

## 2020-06-02 NOTE — Telephone Encounter (Signed)
Kayexelate susp called in per MD verbal order to take 60 today and 60 tomorrow for a total of 120 and to come back on 4/21 to have labs repeated stat

## 2020-06-02 NOTE — Telephone Encounter (Signed)
I spoke with pt, he understands clearly the risk of high potassium, refuses Ed management , I am sending in 2 days of kayexalate, he needs them delivered and to take as directed. He needs stat chem 7 and eGFr on 04 21/2022 Pls call and review al;l of this with him, thanks

## 2020-06-02 NOTE — Telephone Encounter (Signed)
Donald Everett from [employer]- Critical potassium 6.8.

## 2020-06-02 NOTE — Progress Notes (Signed)
Donald Everett     MRN: [id number]      DOB: 08/26/1940   HPI Donald Everett is here for follow up and re-evaluation of chronic medical conditions, medication management and review of any available recent lab and radiology data.  . The PT denies any adverse reactions to current medications since the last visit.  There are no new concerns.  There are no specific complaints  Denies polyuria, polydipsia, blurred vision , or hypoglycemic episodes. Out of pain med and blood pressure medication x 1 week  ROS Denies recent fever or chills. Denies sinus pressure, nasal congestion, ear pain or sore throat. Denies chest congestion, productive cough or wheezing. Denies chest pains, palpitations and leg swelling Denies abdominal pain, nausea, vomiting,diarrhea or constipation.   Denies dysuria, frequency, hesitancy or incontinence. Chronic  joint pain,  and limitation in mobility. Denies headaches, seizures, numbness, or tingling. Denies depression, anxiety or insomnia. Denies skin break down or rash.   PE  BP (!) 203/77   Pulse 81   Resp 15   Ht 5\' 8"  (1.727 m)   Wt 141 lb (64 kg)   SpO2 91%   BMI 21.44 kg/m   Patient alert and oriented   HEENT: No facial asymmetry, EOMI,     Neck supple .  Chest: Clear to auscultation bilaterally.decreased air entry throughout  CVS: S1, S2 no murmurs, no S3.Regular rate.  ABD: Soft non tender.   Ext: No edema  MS: decreased  ROM spine, shoulders, hips and knees.  Skin: Intact, no ulcerations or rash noted.  Psych: Good eye contact, normal affect. t not anxious or depressed appearing.  CNS: CN 2-12 intact, power,  normal throughout.no focal deficits noted.   Assessment & Plan  Renal artery stenosis Pt needs re assessment by vascular, his hypertension is severe and resistant , however he continues to refuse any consultations. Kidney function also very poor and deteriorating  Type 2 diabetes mellitus with vascular disease [facility]) Mr.  Everett is reminded of the importance of commitment to daily physical activity for 30 minutes or more, as able and the need to limit carbohydrate intake to 30 to 60 grams per meal to help with blood sugar control.  Controlled   The need to take medication as prescribed, test blood sugar as directed, and to call between visits if there is a concern that blood sugar is uncontrolled is also discussed.   Donald Everett is reminded of the importance of daily foot exam, annual eye examination, and good blood sugar, blood pressure and cholesterol control.  Diabetic Labs Latest Ref Rng & Units 06/01/2020 04/03/2020 03/24/2020 03/16/2020 11/07/2019  HbA1c 4.8 - 5.6 % - - - 6.6(H) 6.9(A)  Microalbumin mg/dL - - - - -  Micro/Creat Ratio <30 mcg/mg creat - - - - -  Chol 100 - 199 mg/dL - - - 11/09/2019 -  HDL 789 mg/dL - - - 40 -  Calc LDL 0 - 99 mg/dL - - - 52 -  Triglycerides 0 - 149 mg/dL - - - 79 -  Creatinine 0.76 - 1.27 mg/dL 05-22-2001) 1.01(B) 5.10(C) 1.71(H) -   BP/Weight 06/01/2020 03/16/2020 02/17/2020 12/06/2019 12/02/2019 11/07/2019 08/29/2019  Systolic BP 203 183 165 175 176 176 168  Diastolic BP 77 70 68 66 78 78 78  Wt. (Lbs) 141 - - - 146 146 147  BMI 21.44 - - - 22.2 22.2 22.35   Foot/eye exam completion dates Latest Ref Rng & Units 04/23/2020 11/07/2019  Eye  Exam No Retinopathy No Retinopathy -  Foot Form Completion - - Done        COPD (chronic obstructive pulmonary disease) Worsening with ongoing nicotine use.smoking cessation counseling done, states he is trying to cut back  B12 deficiency anemia 1 cc B12 injected IM  Encounter for chronic pain management The patient's Controlled Substance registry is reviewed and compliance confirmed. Adequacy of  Pain control and level of function is assessed. Medication dosing is adjusted as deemed appropriate. Twelve weeks of medication is prescribed , with a follow up appointment between 11 to 12 weeks .   NICOTINE ADDICTION Asked:confirms currently  smokes cigarettes Assess: Unwilling to set a quit date, but is cutting back Advise: needs to QUIT to reduce risk of cancer, cardio and cerebrovascular disease Assist: counseled for 5 minutes and literature provided Arrange: follow up in 3  months   LOW BACK PAIN, CHRONIC Unchanged, continue  Pain management  Insomnia secondary to anxiety Sleep hygiene reviewed and written information offered also. Prescription sent for  medication needed.   Hyperlipidemia LDL goal <100 Hyperlipidemia:Low fat diet discussed and encouraged.   Lipid Panel  Lab Results  Component Value Date   CHOL 108 03/16/2020   HDL 40 03/16/2020   LDLCALC 52 03/16/2020   TRIG 79 03/16/2020   CHOLHDL 2.7 03/16/2020       Hyperkalemia Recurrent problem, needs to have nephrology eval but refuses, also refuses eD management of severe hyperkalemia when indicated

## 2020-06-03 LAB — BMP8+EGFR
BUN/Creatinine Ratio: 17 (ref 10–24)
BUN: 32 mg/dL — ABNORMAL HIGH (ref 8–27)
CO2: 17 mmol/L — ABNORMAL LOW (ref 20–29)
Calcium: 9.1 mg/dL (ref 8.6–10.2)
Chloride: 99 mmol/L (ref 96–106)
Creatinine, Ser: 1.86 mg/dL — ABNORMAL HIGH (ref 0.76–1.27)
Glucose: 195 mg/dL — ABNORMAL HIGH (ref 65–99)
Potassium: 6.8 mmol/L (ref 3.5–5.2)
Sodium: 130 mmol/L — ABNORMAL LOW (ref 134–144)
eGFR: 36 mL/min/{1.73_m2} — ABNORMAL LOW (ref >59)

## 2020-06-03 MED ORDER — HYDROCODONE-ACETAMINOPHEN 7.5-325 MG PO TABS: ORAL_TABLET | ORAL | 0 refills | Status: AC

## 2020-06-03 NOTE — Assessment & Plan Note (Signed)
Sleep hygiene reviewed and written information offered also. Prescription sent for  medication needed.  

## 2020-06-03 NOTE — Assessment & Plan Note (Signed)
Unchanged, continue  Pain management

## 2020-06-03 NOTE — Assessment & Plan Note (Signed)
Recurrent problem, needs to have nephrology eval but refuses, also refuses eD management of severe hyperkalemia when indicated

## 2020-06-03 NOTE — Assessment & Plan Note (Signed)
The patient's Controlled Substance registry is reviewed and compliance confirmed. Adequacy of  Pain control and level of function is assessed. Medication dosing is adjusted as deemed appropriate. Twelve weeks of medication is prescribed , with a follow up appointment between 11 to 12 weeks .  

## 2020-06-03 NOTE — Assessment & Plan Note (Signed)
Pt needs re assessment by vascular, his hypertension is severe and resistant , however he continues to refuse any consultations. Kidney function also very poor and deteriorating

## 2020-06-03 NOTE — Assessment & Plan Note (Signed)
Mr. Rochford is reminded of the importance of commitment to daily physical activity for 30 minutes or more, as able and the need to limit carbohydrate intake to 30 to 60 grams per meal to help with blood sugar control.  Controlled   The need to take medication as prescribed, test blood sugar as directed, and to call between visits if there is a concern that blood sugar is uncontrolled is also discussed.   Mr. Weider is reminded of the importance of daily foot exam, annual eye examination, and good blood sugar, blood pressure and cholesterol control.  Diabetic Labs Latest Ref Rng & Units 06/01/2020 04/03/2020 03/24/2020 03/16/2020 11/07/2019  HbA1c 4.8 - 5.6 % - - - 6.6(H) 6.9(A)  Microalbumin mg/dL - - - - -  Micro/Creat Ratio <30 mcg/mg creat - - - - -  Chol 100 - 199 mg/dL - - - 811 -  HDL >91 mg/dL - - - 40 -  Calc LDL 0 - 99 mg/dL - - - 52 -  Triglycerides 0 - 149 mg/dL - - - 79 -  Creatinine 0.76 - 1.27 mg/dL 4.78(G) 9.56(O) 1.30(Q) 1.71(H) -   BP/Weight 06/01/2020 03/16/2020 02/17/2020 12/06/2019 12/02/2019 11/07/2019 08/29/2019  Systolic BP 203 183 165 175 176 176 168  Diastolic BP 77 70 68 66 78 78 78  Wt. (Lbs) 141 - - - 146 146 147  BMI 21.44 - - - 22.2 22.2 22.35   Foot/eye exam completion dates Latest Ref Rng & Units 04/23/2020 11/07/2019  Eye Exam No Retinopathy No Retinopathy -  Foot Form Completion - - Done

## 2020-06-03 NOTE — Assessment & Plan Note (Signed)
1 cc B12 injected IM

## 2020-06-03 NOTE — Assessment & Plan Note (Signed)
Asked:confirms currently smokes cigarettes Assess: Unwilling to set a quit date, but is cutting back Advise: needs to QUIT to reduce risk of cancer, cardio and cerebrovascular disease Assist: counseled for 5 minutes and literature provided Arrange: follow up in 3  months

## 2020-06-03 NOTE — Assessment & Plan Note (Signed)
Worsening with ongoing nicotine use.smoking cessation counseling done, states he is trying to cut back

## 2020-06-03 NOTE — Assessment & Plan Note (Signed)
Hyperlipidemia:Low fat diet discussed and encouraged.   Lipid Panel  Lab Results  Component Value Date   CHOL 108 03/16/2020   HDL 40 03/16/2020   LDLCALC 52 03/16/2020   TRIG 79 03/16/2020   CHOLHDL 2.7 03/16/2020

## 2020-06-04 DIAGNOSIS — E875 Hyperkalemia: Secondary | ICD-10-CM | POA: Diagnosis not present

## 2020-06-05 LAB — BMP8+EGFR
BUN/Creatinine Ratio: 17 (ref 10–24)
BUN: 28 mg/dL — ABNORMAL HIGH (ref 8–27)
CO2: 18 mmol/L — ABNORMAL LOW (ref 20–29)
Calcium: 8.3 mg/dL — ABNORMAL LOW (ref 8.6–10.2)
Chloride: 99 mmol/L (ref 96–106)
Creatinine, Ser: 1.68 mg/dL — ABNORMAL HIGH (ref 0.76–1.27)
Glucose: 191 mg/dL — ABNORMAL HIGH (ref 65–99)
Potassium: 4.7 mmol/L (ref 3.5–5.2)
Sodium: 133 mmol/L — ABNORMAL LOW (ref 134–144)
eGFR: 41 mL/min/{1.73_m2} — ABNORMAL LOW (ref >59)

## 2020-06-23 ENCOUNTER — Other Ambulatory Visit: Payer: Self-pay | Admitting: Family Medicine

## 2020-06-30 ENCOUNTER — Other Ambulatory Visit: Payer: Self-pay

## 2020-06-30 ENCOUNTER — Ambulatory Visit (INDEPENDENT_AMBULATORY_CARE_PROVIDER_SITE_OTHER): Payer: PPO

## 2020-06-30 DIAGNOSIS — D519 Vitamin B12 deficiency anemia, unspecified: Secondary | ICD-10-CM | POA: Diagnosis not present

## 2020-06-30 MED ORDER — CYANOCOBALAMIN 1000 MCG/ML IJ SOLN
1000.0000 ug | Freq: Once | INTRAMUSCULAR | Status: AC
  Administered 2020-06-30: 1000 ug via INTRAMUSCULAR

## 2020-06-30 NOTE — Addendum Note (Signed)
Addended by: Dellia Cloud on: 06/30/2020 09:49 AM   Modules accepted: Orders

## 2020-07-31 ENCOUNTER — Other Ambulatory Visit: Payer: Self-pay | Admitting: Family Medicine

## 2020-07-31 ENCOUNTER — Ambulatory Visit (INDEPENDENT_AMBULATORY_CARE_PROVIDER_SITE_OTHER): Payer: PPO

## 2020-07-31 ENCOUNTER — Other Ambulatory Visit: Payer: Self-pay

## 2020-07-31 DIAGNOSIS — D519 Vitamin B12 deficiency anemia, unspecified: Secondary | ICD-10-CM | POA: Diagnosis not present

## 2020-07-31 MED ORDER — CYANOCOBALAMIN 1000 MCG/ML IJ SOLN
1000.0000 ug | Freq: Once | INTRAMUSCULAR | Status: AC
  Administered 2020-07-31: 1000 ug via INTRAMUSCULAR

## 2020-08-03 ENCOUNTER — Ambulatory Visit: Payer: PPO

## 2020-08-18 ENCOUNTER — Ambulatory Visit: Payer: PPO | Admitting: Family Medicine

## 2020-08-28 ENCOUNTER — Other Ambulatory Visit: Payer: Self-pay | Admitting: Family Medicine

## 2020-08-31 ENCOUNTER — Ambulatory Visit (INDEPENDENT_AMBULATORY_CARE_PROVIDER_SITE_OTHER): Payer: PPO

## 2020-08-31 ENCOUNTER — Other Ambulatory Visit: Payer: Self-pay

## 2020-08-31 ENCOUNTER — Other Ambulatory Visit: Payer: Self-pay | Admitting: Family Medicine

## 2020-08-31 DIAGNOSIS — D519 Vitamin B12 deficiency anemia, unspecified: Secondary | ICD-10-CM

## 2020-08-31 MED ORDER — CYANOCOBALAMIN 1000 MCG/ML IJ SOLN
1000.0000 ug | Freq: Once | INTRAMUSCULAR | Status: AC
  Administered 2020-08-31: 1000 ug via INTRAMUSCULAR

## 2020-08-31 NOTE — Progress Notes (Signed)
B12 given in right deltoid without complication

## 2020-09-02 ENCOUNTER — Telehealth: Payer: Self-pay | Admitting: Family Medicine

## 2020-09-02 ENCOUNTER — Other Ambulatory Visit: Payer: Self-pay | Admitting: Family Medicine

## 2020-09-02 MED ORDER — HYDROCODONE-ACETAMINOPHEN 7.5-325 MG PO TABS: ORAL_TABLET | ORAL | 0 refills | Status: AC

## 2020-09-02 NOTE — Telephone Encounter (Signed)
Patient called and left message for Brandi to please call him about his pain medicine   HYDROcodone-acetaminophen (NORCO) 7.5-325 MG tablet  Phone Number [phone]

## 2020-09-02 NOTE — Telephone Encounter (Signed)
His pain management follow up is 7/25 but he is out of his pain medication now. Please advise if this can be refilled to last until his appt

## 2020-09-07 ENCOUNTER — Encounter: Payer: Self-pay | Admitting: Family Medicine

## 2020-09-07 ENCOUNTER — Ambulatory Visit (INDEPENDENT_AMBULATORY_CARE_PROVIDER_SITE_OTHER): Payer: PPO | Admitting: Family Medicine

## 2020-09-07 ENCOUNTER — Other Ambulatory Visit: Payer: Self-pay

## 2020-09-07 VITALS — BP 154/61 | HR 70 | Temp 97.7°F | Resp 20 | Ht 68.0 in | Wt 144.0 lb

## 2020-09-07 DIAGNOSIS — E1159 Type 2 diabetes mellitus with other circulatory complications: Secondary | ICD-10-CM

## 2020-09-07 DIAGNOSIS — F411 Generalized anxiety disorder: Secondary | ICD-10-CM

## 2020-09-07 DIAGNOSIS — F419 Anxiety disorder, unspecified: Secondary | ICD-10-CM

## 2020-09-07 DIAGNOSIS — E875 Hyperkalemia: Secondary | ICD-10-CM

## 2020-09-07 DIAGNOSIS — E785 Hyperlipidemia, unspecified: Secondary | ICD-10-CM | POA: Diagnosis not present

## 2020-09-07 DIAGNOSIS — G8929 Other chronic pain: Secondary | ICD-10-CM | POA: Diagnosis not present

## 2020-09-07 DIAGNOSIS — F17200 Nicotine dependence, unspecified, uncomplicated: Secondary | ICD-10-CM

## 2020-09-07 DIAGNOSIS — J449 Chronic obstructive pulmonary disease, unspecified: Secondary | ICD-10-CM | POA: Diagnosis not present

## 2020-09-07 DIAGNOSIS — F5105 Insomnia due to other mental disorder: Secondary | ICD-10-CM | POA: Diagnosis not present

## 2020-09-07 DIAGNOSIS — I701 Atherosclerosis of renal artery: Secondary | ICD-10-CM | POA: Diagnosis not present

## 2020-09-07 DIAGNOSIS — F17210 Nicotine dependence, cigarettes, uncomplicated: Secondary | ICD-10-CM | POA: Diagnosis not present

## 2020-09-07 DIAGNOSIS — I150 Renovascular hypertension: Secondary | ICD-10-CM | POA: Diagnosis not present

## 2020-09-07 NOTE — Patient Instructions (Signed)
F/U in 3 months, call if you need me before  Congrats on cutting down smoking to 15 per day keep going down  Your medication is at the pharmacy to be filled, nurse please check pharmacy to verify they have script sent in from 7/20  HBA1C, chem 7 and EGFR  today  Happy birthday next week, and many many more!!!  Thanks for choosing [facility], we consider it a privelige to serve you.

## 2020-09-08 ENCOUNTER — Other Ambulatory Visit: Payer: Self-pay

## 2020-09-08 DIAGNOSIS — E875 Hyperkalemia: Secondary | ICD-10-CM

## 2020-09-08 MED ORDER — SODIUM POLYSTYRENE SULFONATE 15 GM/60ML PO SUSP: ORAL | 0 refills | Status: AC

## 2020-09-08 NOTE — Progress Notes (Signed)
Can nasal   TREVAR BOEHRINGER     MRN: [id number]      DOB: 07-27-1940   HPI [name] is here for follow up and re-evaluation of chronic medical conditions, medication management and review of any available recent lab and radiology data.  Preventive health is updated, specifically  Cancer screening and Immunization.   Questions or concerns regarding consultations or procedures which the PT has had in the interim are  addressed. The PT denies any adverse reactions to current medications since the last visit.  There are no new concerns.  There are no specific complaints   ROS Denies recent fever or chills. Denies sinus pressure, nasal congestion, ear pain or sore throat. Denies chest congestion, productive cough or wheezing. Denies chest pains, palpitations and leg swelling Denies abdominal pain, nausea, vomiting,diarrhea or constipation.   Denies dysuria, frequency, hesitancy or incontinence. Denies joint pain, swelling and limitation in mobility. Denies headaches, seizures, numbness, or tingling. Denies depression, anxiety or insomnia. Denies skin break down or rash.   PE  BP (!) 154/61 (BP Location: Right Arm, Patient Position: Sitting, Cuff Size: Large)   Pulse 70   Temp 97.7 F (36.5 C)   Resp 20   Ht 5\' 8"  (1.727 m)   Wt 144 lb (65.3 kg)   SpO2 93%   BMI 21.90 kg/m   Patient alert and oriented and in no cardiopulmonary distress.  HEENT: No facial asymmetry, EOMI,     Neck supple .  Chest: Clear to auscultation bilaterally.  CVS: S1, S2 no murmurs, no S3.Regular rate.  ABD: Soft non tender.   Ext: No edema  MS: Adequate ROM spine, shoulders, hips and knees.  Skin: Intact, no ulcerations or rash noted.  Psych: Good eye contact, normal affect. Memory intact not anxious or depressed appearing.  CNS: CN 2-12 intact, power,  normal throughout.no focal deficits noted.   Assessment & Plan  Encounter for chronic pain management The patient's Controlled  Substance registry is reviewed and compliance confirmed. Adequacy of  Pain control and level of function is assessed. Medication dosing is adjusted as deemed appropriate. Twelve weeks of medication is prescribed , with a follow up appointment between 11 to 12 weeks .   COPD (chronic obstructive pulmonary disease) Progressively worsening due to ongoing nicotine use, has recently cut back to 15/day which is encouraging  Hyperlipidemia LDL goal <100 Hyperlipidemia:Low fat diet discussed and encouraged.   Lipid Panel  Lab Results  Component Value Date   CHOL 108 03/16/2020   HDL 40 03/16/2020   LDLCALC 52 03/16/2020   TRIG 79 03/16/2020   CHOLHDL 2.7 03/16/2020       Type 2 diabetes mellitus with vascular disease [facility]) Mr. Bruna is reminded of the importance of commitment to daily physical activity for 30 minutes or more, as able and the need to limit carbohydrate intake to 30 to 60 grams per meal to help with blood sugar control.   The need to take medication as prescribed, test blood sugar as directed, and to call between visits if there is a concern that blood sugar is uncontrolled is also discussed.   Mr. Schrieber is reminded of the importance of daily foot exam, annual eye examination, and good blood sugar, blood pressure and cholesterol control. Controlled, no change in medication   Diabetic Labs Latest Ref Rng & Units 09/07/2020 06/04/2020 06/01/2020 04/03/2020 03/24/2020  HbA1c 4.8 - 5.6 % 6.9(H) - - - -  Microalbumin mg/dL - - - - -  Micro/Creat Ratio <30 mcg/mg creat - - - - -  Chol 100 - 199 mg/dL - - - - -  HDL >70 mg/dL - - - - -  Calc LDL 0 - 99 mg/dL - - - - -  Triglycerides 0 - 149 mg/dL - - - - -  Creatinine 2.63 - 1.27 mg/dL 7.85(Y) 8.50(Y) 7.74(J) 1.71(H) 1.73(H)   BP/Weight 09/07/2020 06/01/2020 03/16/2020 02/17/2020 12/06/2019 12/02/2019 11/07/2019  Systolic BP 154 203 183 165 175 176 176  Diastolic BP 61 77 70 68 66 78 78  Wt. 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 Prescription sent for  medication needed.   NICOTINE ADDICTION Asked:confirms currently smokes cigarettes Assess: Unwilling to set a quit date, but is cutting back Advise: needs to QUIT to reduce risk of cancer, cardio and cerebrovascular disease Assist: counseled for 5 minutes and literature provided Arrange: follow up in 2 to 4 months

## 2020-09-09 LAB — HEMOGLOBIN A1C
Est. average glucose Bld gHb Est-mCnc: 151 mg/dL
Hgb A1c MFr Bld: 6.9 % — ABNORMAL HIGH (ref 4.8–5.6)

## 2020-09-09 LAB — CMP14+EGFR
ALT: 15 IU/L (ref 0–44)
AST: 24 IU/L (ref 0–40)
Albumin/Globulin Ratio: 1.5 (ref 1.2–2.2)
Albumin: 3.8 g/dL (ref 3.7–4.7)
Alkaline Phosphatase: 86 IU/L (ref 44–121)
BUN/Creatinine Ratio: 21 (ref 10–24)
BUN: 35 mg/dL — ABNORMAL HIGH (ref 8–27)
Bilirubin Total: 0.5 mg/dL (ref 0.0–1.2)
CO2: 17 mmol/L — ABNORMAL LOW (ref 20–29)
Calcium: 9 mg/dL (ref 8.6–10.2)
Chloride: 96 mmol/L (ref 96–106)
Creatinine, Ser: 1.68 mg/dL — ABNORMAL HIGH (ref 0.76–1.27)
Globulin, Total: 2.5 g/dL (ref 1.5–4.5)
Glucose: 286 mg/dL — ABNORMAL HIGH (ref 65–99)
Potassium: 6.3 mmol/L — ABNORMAL HIGH (ref 3.5–5.2)
Sodium: 125 mmol/L — ABNORMAL LOW (ref 134–144)
Total Protein: 6.3 g/dL (ref 6.0–8.5)
eGFR: 41 mL/min/{1.73_m2} — ABNORMAL LOW (ref >59)

## 2020-09-14 ENCOUNTER — Telehealth: Payer: Self-pay | Admitting: Family Medicine

## 2020-09-14 ENCOUNTER — Encounter: Payer: Self-pay | Admitting: Family Medicine

## 2020-09-14 ENCOUNTER — Other Ambulatory Visit: Payer: Self-pay

## 2020-09-14 DIAGNOSIS — E875 Hyperkalemia: Secondary | ICD-10-CM

## 2020-09-14 MED ORDER — HYDROCODONE-ACETAMINOPHEN 7.5-325 MG PO TABS: ORAL_TABLET | ORAL | 0 refills | Status: AC

## 2020-09-14 MED ORDER — NALOXONE HCL 4 MG/0.1ML NA LIQD
NASAL | 0 refills | Status: AC
Start: 2020-09-14 — End: ?

## 2020-09-14 NOTE — Telephone Encounter (Signed)
He said he will try to get his lab work today or tomorrow. Does not want chest scan. Since transport issues he will get the bmp checked the next few months when he comes for his b12 shot

## 2020-09-14 NOTE — Assessment & Plan Note (Signed)
Donald Everett is reminded of the importance of commitment to daily physical activity for 30 minutes or more, as able and the need to limit carbohydrate intake to 30 to 60 grams per meal to help with blood sugar control.   The need to take medication as prescribed, test blood sugar as directed, and to call between visits if there is a concern that blood sugar is uncontrolled is also discussed.   Donald Everett is reminded of the importance of daily foot exam, annual eye examination, and good blood sugar, blood pressure and cholesterol control. Controlled, no change in medication   Diabetic Labs Latest Ref Rng & Units 09/07/2020 06/04/2020 06/01/2020 04/03/2020 03/24/2020  HbA1c 4.8 - 5.6 % 6.9(H) - - - -  Microalbumin mg/dL - - - - -  Micro/Creat Ratio <30 mcg/mg creat - - - - -  Chol 100 - 199 mg/dL - - - - -  HDL >71 mg/dL - - - - -  Calc LDL 0 - 99 mg/dL - - - - -  Triglycerides 0 - 149 mg/dL - - - - -  Creatinine 0.62 - 1.27 mg/dL 6.94(W) 5.46(E) 7.03(J) 1.71(H) 1.73(H)   BP/Weight 09/07/2020 06/01/2020 03/16/2020 02/17/2020 12/06/2019 12/02/2019 11/07/2019  Systolic BP 154 203 183 165 175 176 176  Diastolic BP 61 77 70 68 66 78 78  Wt. (Lbs) 144 141 - - - 146 146  BMI 21.9 21.44 - - - 22.2 22.2   Foot/eye exam completion dates Latest Ref Rng & Units 04/23/2020 11/07/2019  Eye Exam No Retinopathy No Retinopathy -  Foot Form Completion - - Done

## 2020-09-14 NOTE — Assessment & Plan Note (Signed)
The patient's Controlled Substance registry is reviewed and compliance confirmed. Adequacy of  Pain control and level of function is assessed. Medication dosing is adjusted as deemed appropriate. Twelve weeks of medication is prescribed , with a follow up appointment between 11 to 12 weeks .  

## 2020-09-14 NOTE — Assessment & Plan Note (Signed)
Sleep hygiene reviewed and written information offered also. Prescription sent for  medication needed.  

## 2020-09-14 NOTE — Assessment & Plan Note (Signed)
Hyperlipidemia:Low fat diet discussed and encouraged.   Lipid Panel  Lab Results  Component Value Date   CHOL 108 03/16/2020   HDL 40 03/16/2020   LDLCALC 52 03/16/2020   TRIG 79 03/16/2020   CHOLHDL 2.7 03/16/2020

## 2020-09-14 NOTE — Telephone Encounter (Signed)
Pls call pt, tell him " Happy Birthday for [birth year]!  ALSO, should be getting rept chem7 and EGFR today to re assess high potassium or early this week  I would like chem 7 and EGFR non fasting the first week in September and October  and November also, as his potassium gets high because of his Chronic kidney disease, please order and explain  Also pls let him know that  I ordered narcan a nasal spray for use in case of unintentional overdose as he take several different medications that can cause him to have problems with breathing If he has any questions please let me know  Please also ask and encourage chest scan for lung cancer screening , none in 4 years , please order if he agrees

## 2020-09-14 NOTE — Assessment & Plan Note (Signed)
Have been recommending vascular eval for years, pt repeatedly declines

## 2020-09-14 NOTE — Assessment & Plan Note (Signed)
Controlled, no change in medication  

## 2020-09-14 NOTE — Assessment & Plan Note (Signed)
Progressively worsening due to ongoing nicotine use, has recently cut back to 15/day which is encouraging

## 2020-09-14 NOTE — Assessment & Plan Note (Signed)
Asked:confirms currently smokes cigarettes °Assess: Unwilling to set a quit date, but is cutting back °Advise: needs to QUIT to reduce risk of cancer, cardio and cerebrovascular disease °Assist: counseled for 5 minutes and literature provided °Arrange: follow up in 2 to 4 months ° °

## 2020-09-14 NOTE — Assessment & Plan Note (Signed)
Improved, by not at goal, needs vascular evaluation of RAS, however insists that h does not want any evaluation

## 2020-09-14 NOTE — Assessment & Plan Note (Addendum)
Recurrent  due to CKD, will plan to start monthly check with correction Kayexalate prescribed as current potassium is elevated with rept test in 4 days

## 2020-09-15 DIAGNOSIS — E875 Hyperkalemia: Secondary | ICD-10-CM | POA: Diagnosis not present

## 2020-09-16 LAB — BMP8+EGFR
BUN/Creatinine Ratio: 22 (ref 10–24)
BUN: 37 mg/dL — ABNORMAL HIGH (ref 8–27)
CO2: 16 mmol/L — ABNORMAL LOW (ref 20–29)
Calcium: 8.7 mg/dL (ref 8.6–10.2)
Chloride: 100 mmol/L (ref 96–106)
Creatinine, Ser: 1.68 mg/dL — ABNORMAL HIGH (ref 0.76–1.27)
Glucose: 209 mg/dL — ABNORMAL HIGH (ref 65–99)
Potassium: 6.1 mmol/L — ABNORMAL HIGH (ref 3.5–5.2)
Sodium: 128 mmol/L — ABNORMAL LOW (ref 134–144)
eGFR: 41 mL/min/{1.73_m2} — ABNORMAL LOW (ref >59)

## 2020-09-17 ENCOUNTER — Other Ambulatory Visit: Payer: Self-pay

## 2020-09-17 DIAGNOSIS — E875 Hyperkalemia: Secondary | ICD-10-CM

## 2020-09-18 ENCOUNTER — Other Ambulatory Visit: Payer: Self-pay | Admitting: Family Medicine

## 2020-09-18 MED ORDER — SODIUM POLYSTYRENE SULFONATE 15 GM/60ML PO SUSP: ORAL | 0 refills | Status: AC

## 2020-09-18 NOTE — Progress Notes (Signed)
I spoke with the pt, he is aware that a 3 day med supply is prescribed for today

## 2020-09-29 ENCOUNTER — Other Ambulatory Visit: Payer: Self-pay | Admitting: Family Medicine

## 2020-10-01 ENCOUNTER — Ambulatory Visit (INDEPENDENT_AMBULATORY_CARE_PROVIDER_SITE_OTHER): Payer: PPO

## 2020-10-01 ENCOUNTER — Other Ambulatory Visit: Payer: Self-pay

## 2020-10-01 DIAGNOSIS — D519 Vitamin B12 deficiency anemia, unspecified: Secondary | ICD-10-CM | POA: Diagnosis not present

## 2020-10-01 DIAGNOSIS — E875 Hyperkalemia: Secondary | ICD-10-CM | POA: Diagnosis not present

## 2020-10-01 MED ORDER — CYANOCOBALAMIN 1000 MCG/ML IJ SOLN
1000.0000 ug | Freq: Once | INTRAMUSCULAR | Status: AC
  Administered 2020-10-01: 1000 ug via INTRAMUSCULAR

## 2020-10-02 ENCOUNTER — Other Ambulatory Visit: Payer: Self-pay | Admitting: Family Medicine

## 2020-10-02 ENCOUNTER — Other Ambulatory Visit: Payer: Self-pay

## 2020-10-02 DIAGNOSIS — E875 Hyperkalemia: Secondary | ICD-10-CM

## 2020-10-02 LAB — BMP8+EGFR
BUN/Creatinine Ratio: 17 (ref 10–24)
BUN: 30 mg/dL — ABNORMAL HIGH (ref 8–27)
CO2: 17 mmol/L — ABNORMAL LOW (ref 20–29)
Calcium: 8.9 mg/dL (ref 8.6–10.2)
Chloride: 103 mmol/L (ref 96–106)
Creatinine, Ser: 1.73 mg/dL — ABNORMAL HIGH (ref 0.76–1.27)
Glucose: 208 mg/dL — ABNORMAL HIGH (ref 65–99)
Potassium: 5.6 mmol/L — ABNORMAL HIGH (ref 3.5–5.2)
Sodium: 134 mmol/L (ref 134–144)
eGFR: 39 mL/min/{1.73_m2} — ABNORMAL LOW (ref >59)

## 2020-10-02 MED ORDER — SODIUM POLYSTYRENE SULFONATE 15 GM/60ML PO SUSP: ORAL | 0 refills | Status: AC

## 2020-10-02 MED ORDER — FLUTICASONE PROPIONATE 50 MCG/ACT NA SUSP: 1.0000 | Freq: Every day | NASAL | 5 refills | Status: AC

## 2020-10-02 NOTE — Progress Notes (Signed)
kayexalate

## 2020-10-05 ENCOUNTER — Telehealth: Payer: Self-pay | Admitting: *Deleted

## 2020-10-05 NOTE — Chronic Care Management (AMB) (Signed)
  Chronic Care Management   Note  10/05/2020 Name: KEATS KINGRY MRN: [id number] DOB: 1940/10/28  ITHAN TOUHEY is a 80 y.o. year old male who is a primary care patient of Moshe Cipro Norwood Levo, MD. I reached out to Jaye Beagle by phone today in response to a referral sent by Mr. Doralee Albino PCP, Fayrene Helper, MD      Mr. Hirota was given information about Chronic Care Management services today including:  CCM service includes personalized support from designated clinical staff supervised by his physician, including individualized plan of care and coordination with other care providers 24/7 contact phone numbers for assistance for urgent and routine care needs. Service will only be billed when office clinical staff spend 20 minutes or more in a month to coordinate care. Only one practitioner may furnish and bill the service in a calendar month. The patient may stop CCM services at any time (effective at the end of the month) by phone call to the office staff. The patient will be responsible for cost sharing (co-pay) of up to 20% of the service fee (after annual deductible is met).  Patient agreed to services and verbal consent obtained.   Follow up plan: Telephone appointment with care management team member scheduled for:10/07/20  Khaniyah Bezek  Care Guide, Embedded Care Coordination [facility]  Care Management  Direct Dial: [phone]

## 2020-10-05 NOTE — Progress Notes (Signed)
Pt aware and med to be delivered

## 2020-10-05 NOTE — Progress Notes (Signed)
Pt aware.

## 2020-10-05 NOTE — Chronic Care Management (AMB) (Signed)
  Chronic Care Management   Outreach Note  10/05/2020 Name: [name] MRN: [id number] DOB: January 14, 1941  Donald Everett is a 80 y.o. year old male who is a primary care patient of Lodema Hong Milus Mallick, MD. I reached out to [name] by phone today in response to a referral sent by [name] PCP, Kerri Perches, MD      An unsuccessful telephone outreach was attempted today. The patient was referred to the case management team for assistance with care management and care coordination.   Follow Up Plan: A HIPAA compliant phone message was left for the patient providing contact information and requesting a return call.  The care management team will reach out to the patient again over the next 7 days.  If patient returns call to provider office, please advise to call Embedded Care Management Care Guide [name]* at [phone].[name]  Care Guide, Embedded Care Coordination [facility] Management  Direct Dial: [phone]

## 2020-10-07 ENCOUNTER — Ambulatory Visit (INDEPENDENT_AMBULATORY_CARE_PROVIDER_SITE_OTHER): Payer: PPO | Admitting: Pharmacist

## 2020-10-07 DIAGNOSIS — J449 Chronic obstructive pulmonary disease, unspecified: Secondary | ICD-10-CM | POA: Diagnosis not present

## 2020-10-07 DIAGNOSIS — I150 Renovascular hypertension: Secondary | ICD-10-CM

## 2020-10-07 DIAGNOSIS — E785 Hyperlipidemia, unspecified: Secondary | ICD-10-CM | POA: Diagnosis not present

## 2020-10-07 DIAGNOSIS — E1159 Type 2 diabetes mellitus with other circulatory complications: Secondary | ICD-10-CM

## 2020-10-07 DIAGNOSIS — N1832 Chronic kidney disease, stage 3b: Secondary | ICD-10-CM

## 2020-10-07 NOTE — Patient Instructions (Signed)
Donald Everett,  It was great to talk to you today!  Please call me with any questions or concerns.   Visit Information   PATIENT GOALS:   Goals Addressed             This Visit's Progress    Medication Magement       Patient Goals/Self-Care Activities Over the next 90  days, patient will:  Focus on medication adherence by keeping up with prescription refills and either using a pill box or reminders to take your medications at the prescribed times Check blood sugar three times a day at the following times: fasting (at least 8 hours since last food consumption), 5-15 minutes before dinner, and bedtime, document, and provide at future appointments Check blood pressure at least once daily, document, and provide at future appointments Engage in dietary modifications by fewer sweetened foods & beverages and decreased sodium intake        Consent to CCM Services: Mr. Cranston was given information about Chronic Care Management services including:  CCM service includes personalized support from designated clinical staff supervised by his physician, including individualized plan of care and coordination with other care providers 24/7 contact phone numbers for assistance for urgent and routine care needs. Service will only be billed when office clinical staff spend 20 minutes or more in a month to coordinate care. Only one practitioner may furnish and bill the service in a calendar month. The patient may stop CCM services at any time (effective at the end of the month) by phone call to the office staff. The patient will be responsible for cost sharing (co-pay) of up to 20% of the service fee (after annual deductible is met).  Patient agreed to services and verbal consent obtained.   The patient verbalized understanding of instructions, educational materials, and care plan provided today and declined offer to receive copy of patient instructions, educational materials, and care plan.    The patient has been provided with contact information for the care management team and has been advised to call with any health related questions or concerns.  Next PCP appointment scheduled for: 12/10/20  Kennon Holter, PharmD Clinical Pharmacist [facility] [phone]  CLINICAL CARE PLAN: Patient Care Plan: Medication Management     Problem Identified: Diabetes, Hypertension, CAD/PAD/HLD, COPD, CKD3b, Nicotine dependence   Priority: High  Onset Date: 10/07/2020     Long-Range Goal: Disease Progression Prevention   Start Date: 10/07/2020  Expected End Date: 01/05/2021  This Visit's Progress: On track  Priority: High  Note:   Current Barriers:  Unable to achieve control of hypertension   Pharmacist Clinical Goal(s):  Over the next 90 days, patient will Achieve control of hypertension  as evidenced by improved blood pressure control through collaboration with PharmD and provider.   Interventions: 1:1 collaboration with Fayrene Helper, MD regarding development and update of comprehensive plan of care as evidenced by provider attestation and co-signature Inter-disciplinary care team collaboration (see longitudinal plan of care) Comprehensive medication review performed; medication list updated in electronic medical record  Type 2 Diabetes: Current medications: Novolog Mix 70/30 20 units subcutaneously once daily in the morning Intolerances: unknown Taking medications as directed:  unclear Side effects thought to be attributed to current medication regimen: no Denies hypoglycemic/hyperglycemic symptoms Hypoglycemia prevention: none but will recommend Current meal patterns: breakfast: eggs, oatmeal, and cereal (alternating); lunch: sandwich and chips ; dinner:  frozen dinners ; snacks:  peanut butter crackers or oatmeal cream pie ; drinks:  water, coffee with Sweet-N-Low sugar, and diet soda Current exercise:  housework and yardwork On a statin: $RemoveB'[x]'BVucmDDd$   Yes  $Re'[]'ejO$  No    Last microalbumin: 28.4 (08/06/19); on an ACEi/ARB: $RemoveBefo'[x]'RxMByPyWPbd$  Yes  $Re'[]'UiJ$  No    Last eye exam: March 2022 per patient Last foot exam: unknown Pneumonia vaccine: completed Current glucose readings: fasting blood glucose:  130s , pre-prandial blood glucose:  high 200s , post prandial and bedtime glucose:  high 200s Controlled; Most recent A1c at goal of <7% per ADA guidelines Introduction to diabetes basic facts Medication: Identify diabetes medication and when best taken Monitoring: target blood sugar range, when to test Signs and symptoms of hyperglycemia Hypoglycemia: I have discussed with the patient how to treat hypoglycemia by the rule of 15; eat/drink 15g of sugar in the form of glucose tabs, 4 ounces of juice or soda and recheck fingerstick glucose in 15 minutes. Chocolate bars and ice cream should be avoided because fat delays carbohydrate digestion and absorption. Retreat if glucose remains low. Driving should cease until glucose is normal. Need for daily foot inspection and annual eye exam Instructed to monitor blood sugars three times a day at the following times: fasting (at least 8 hours since last food consumption), 5-15 minutes before dinner, bedtime, and whenever patient experiences symptoms of hypo/hyperglycemia Consider slight increase in insulin given frequent hyperglycemia without recent hypoglycemic episode . Patient reports only taking 20 units of Novolog 70/30 subcutaneously before breakfast but appears to have blood glucose in the 200s all days. Fasting blood glucose nearly within normal limits. Consider glucagon for the treatment of severe hypoglycemia  Hypertension: Current medications: losartan 100 mg by mouth once daily, amlodipine 12.5 mg by mouth at bedtime, spironolactone 50 mg by mouth once daily, and hydralazine 50 mg by mouth every 8 hours Intolerances: unknown Taking medications as directed: yes Side effects thought to be attributed to current medication  regimen: no Denies dizziness, lightheadedness, blurred vision, and headache Home blood pressure readings: has a wrist cuff and only checks 2-3x per month but unsure of readings Blood pressure under poor control. Blood pressure is above goal of <130/80 mmHg per 2017 AHA/ACC guidelines. Patient advised to get an arm blood pressure cuff and check daily Continue losartan 100 mg by mouth once daily, amlodipine 12.5 mg by mouth at bedtime, spironolactone 50 mg by mouth once daily, and hydralazine 50 mg by mouth every 8 hours Encourage dietary sodium restriction/DASH diet Recommend home blood pressure monitoring, to bring results in next visit Discussed need for medication compliance Reviewed risks of hypertension, principles of treatment and consequences of untreated hypertension Patient counseled on smoking cessation Consider further titration of hydralazine to 75 or 100 mg by mouth three time daily  Could also consider adding thiazide diuretic such as chlorthalidone 12.5 mg by mouth daily which may also help decrease potassium levels (See CLICK Trial for thiazide evidence of blood pressure reduction in chronic kidney disease). Would recommend to monitor BMP closely after initiation.   Hyperlipidemia: Current medications: pravastatin 10 mg by mouth once daily Also taking aspirin 325 mg by mouth daily which was recommended by a heart doctor in [place] per patient Intolerances: none Taking medications as directed: yes Side effects thought to be attributed to current medication regimen: no Controlled; LDL at goal of <70 due to very high risk given diabetes + at least 1 additional major risk factor (advancing age, hypertension, chronic kidney disease (CKD), and cigarette smoking) per 2020 AACE/ACE guidelines and TG at goal of <  150 per 2020 AACE/ACE guidelines Continue pravastatin 10 mg by mouth once daily Reviewed risks of hyperlipidemia, principles of treatment and consequences of untreated  hyperlipidemia Discussed need for medication compliance Patient counseled on smoking cessation  Chronic Obstructive Pulmonary Disease: Controlled Current treatment: fluticasone/salmeterol (Advair Diskus, AirDuo RespiClick, AirDuo Digihaler, Wixela Inhub) 100-50 mcg 1 puff by mouth every 12 hours Most recent Pulmonary Function Testing: unknown 0 exacerbations requiring treatment in the last 6 months  Current oxygen requirements: none Continue fluticasone/salmeterol (Advair Diskus) 100-50 mcg 1 puff by mouth every 12 hours Consider addition of LAMA such as tiotropium (Spiriva Respimat) 2 inhalations once daily Recommend checking pulmonary function test Patient counseled on smoking cessation  Tobacco Abuse (Current Smoker): Patient endorses that he currently smokes ~20 cigarettes per day Patient has smoked for 70+ years Patient does smoke within 30 minutes of waking up Previous quit attempts: successful although patient unsure what he used Current medication management: none Barriers: under a lot of stress now Motivation to quit smoking: On a scale of 1-10, how IMPORTANT is it for you to quit smoking: 10 On a scale of 1-10, how CONFIDENT are you that you can quit smoking: 10 Advised patient to quit smoking and offered support but patient reports he is not ready to quit at this time  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): Current medications: losartan 100 mg by mouth once daily, spironolactone 50 mg by mouth once daily, and pravastatin 10 mg by mouth once daily Intolerances: none Taking medications as directed: yes Side effects thought to be attributed to current medication regimen: no Denies dizziness, lightheadedness, blurred vision, and headache Most recent GFR: 38 mL/min Most recent microalbumin: 28.4 Continue losartan 100 mg by mouth once daily, spironolactone 50 mg by mouth once daily, and pravastatin 10 mg by mouth once  daily Patient counseled on smoking cessation Discussed need for medication compliance Discussed need for improved control of blood pressure Discussed need for improved control of blood sugar  Patient Goals/Self-Care Activities Over the next 90  days, patient will:  Focus on medication adherence by keeping up with prescription refills and either using a pill box or reminders to take your medications at the prescribed times Check blood sugar three times a day at the following times: fasting (at least 8 hours since last food consumption), 5-15 minutes before dinner, and bedtime, document, and provide at future appointments Check blood pressure at least once daily, document, and provide at future appointments Engage in dietary modifications by fewer sweetened foods & beverages and decreased sodium intake  Follow Up Plan: The patient has been provided with contact information for the care management team and has been advised to call with any health related questions or concerns.  Next PCP appointment scheduled for: 12/10/20

## 2020-10-07 NOTE — Chronic Care Management (AMB) (Signed)
Chronic Care Management Pharmacy Note  10/07/2020 Name:  Donald Everett MRN:  [id number] DOB:  [birth year]  Summary:  Current glucose readings: fasting blood glucose:  130s, pre-prandial blood glucose:  high 200s, post prandial and bedtime glucose:  high 200s Patient advised to get an arm blood pressure cuff and check daily  Recommendations made from today's visit: Type-2 diabetes  Consider slight increase in insulin given frequent hyperglycemia without recent hypoglycemic episode . Patient reports only taking 20 units of Novolog 70/30 subcutaneously before breakfast but appears to have blood glucose in the 200s all day. Fasting blood glucose nearly within normal limits. Consider glucagon for the treatment of severe hypoglycemia  Hypertension Consider further titration of hydralazine to 75 or 100 mg by mouth three time daily  Could also consider adding thiazide diuretic such as chlorthalidone 12.5 mg by mouth daily which may also help decrease potassium levels (See CLICK Trial for thiazide evidence of blood pressure reduction in chronic kidney disease). Would recommend to monitor BMP closely after initiation.   Subjective: Donald Everett is an 80 y.o. year old male who is a primary patient of Moshe Cipro, [place] Levo, MD.  The CCM team was consulted for assistance with disease management and care coordination needs.    Engaged with patient by telephone for initial visit in response to provider referral for pharmacy case management and/or care coordination services.   Consent to Services:  The patient was given the following information about Chronic Care Management services today, agreed to services, and gave verbal consent: 1. CCM service includes personalized support from designated clinical staff supervised by the primary care provider, including individualized plan of care and coordination with other care providers 2. 24/7 contact phone numbers for assistance for urgent and routine care  needs. 3. Service will only be billed when office clinical staff spend 20 minutes or more in a month to coordinate care. 4. Only one practitioner may furnish and bill the service in a calendar month. 5.The patient may stop CCM services at any time (effective at the end of the month) by phone call to the office staff. 6. The patient will be responsible for cost sharing (co-pay) of up to 20% of the service fee (after annual deductible is met). Patient agreed to services and consent obtained.  Patient Care Team: Fayrene Helper, MD as PCP - General Laural Golden Mechele Dawley, MD as Consulting Physician (Gastroenterology) Bettina Gavia, OD (Optometry) Beryle Lathe, [facility] (Pharmacist)  Objective:  Lab Results  Component Value Date   CREATININE 1.73 (H) 10/01/2020   CREATININE 1.68 (H) 09/15/2020   CREATININE 1.68 (H) 09/07/2020    Lab Results  Component Value Date   HGBA1C 6.9 (H) 09/07/2020   Last diabetic Eye exam:  Lab Results  Component Value Date/Time   HMDIABEYEEXA No Retinopathy 04/23/2020 12:00 AM    Last diabetic Foot exam: No results found for: HMDIABFOOTEX      Component Value Date/Time   CHOL 108 03/16/2020 0834   TRIG 79 03/16/2020 0834   HDL 40 03/16/2020 0834   CHOLHDL 2.7 03/16/2020 0834   CHOLHDL 3.1 08/06/2019 0858   VLDL 22 05/16/2016 0829   LDLCALC 52 03/16/2020 0834   LDLCALC 57 08/06/2019 0858    Hepatic Function Latest Ref Rng & Units 09/07/2020 03/16/2020 08/06/2019  Total Protein 6.0 - 8.5 g/dL 6.3 6.0 5.8(L)  Albumin 3.7 - 4.7 g/dL 3.8 3.2(L) -  AST 0 - 40 IU/L $Remov'24 23 17  'flzWZj$ ALT 0 -  44 IU/L $Remov'15 12 11  'IsFFEi$ Alk Phosphatase 44 - 121 IU/L 86 86 -  Total Bilirubin 0.0 - 1.2 mg/dL 0.5 0.4 0.4  Bilirubin, Direct <=0.2 mg/dL - - -    Lab Results  Component Value Date/Time   TSH 5.850 (H) 03/16/2020 08:34 AM   TSH 5.850 (H) 03/16/2020 08:34 AM   FREET4 1.21 03/16/2020 08:34 AM   FREET4 0.9 12/25/2017 08:43 AM    CBC Latest Ref Rng & Units 08/06/2019  04/19/2018 03/23/2017  WBC 3.8 - 10.8 Thousand/uL 10.5 9.6 10.1  Hemoglobin 13.2 - 17.1 g/dL 11.7(L) 12.5(L) 11.4(L)  Hematocrit 38.5 - 50.0 % 35.5(L) 37.0(L) 36.0(L)  Platelets 140 - 400 Thousand/uL 202 228 248    Lab Results  Component Value Date/Time   VD25OH 53.8 03/16/2020 08:34 AM   VD25OH 19 (L) 11/30/2018 08:51 AM    Clinical ASCVD: No  The ASCVD Risk score Mikey Bussing DC Jr., et al., 2013) failed to calculate for the following reasons:   The 2013 ASCVD risk score is only valid for ages 46 to 14    Social History   Tobacco Use  Smoking Status Every Day   Packs/day: 1.00   Years: 60.00   Pack years: 60.00   Types: Cigarettes  Smokeless Tobacco Former   Types: Chew   Quit date: 10/20/1983  Tobacco Comments   18   BP Readings from Last 3 Encounters:  09/07/20 (!) 154/61  06/01/20 (!) 203/77  03/16/20 (!) 183/70   Pulse Readings from Last 3 Encounters:  09/07/20 70  06/01/20 81  12/02/19 78   Wt Readings from Last 3 Encounters:  09/07/20 144 lb (65.3 kg)  06/01/20 141 lb (64 kg)  12/02/19 146 lb (66.2 kg)    Assessment: Review of patient past medical history, allergies, medications, health status, including review of consultants reports, laboratory and other test data, was performed as part of comprehensive evaluation and provision of chronic care management services.   SDOH:  (Social Determinants of Health) assessments and interventions performed:    CCM Care Plan  No Known Allergies  Medications Reviewed Today     Reviewed by Beryle Lathe, [facility] (Pharmacist) on 10/07/20 at 1322  Med List Status: <None>   Medication Order Taking? Sig Documenting Provider Last Dose Status Informant  ADVAIR DISKUS 100-50 MCG/ACT AEPB [id number] Yes INHALE 1 INHALATION EVERY 12 HOURS. Fayrene Helper, MD Taking Active   amLODipine (NORVASC) 10 MG tablet [id number] Yes Take 1 tablet (10 mg total) by mouth daily. Fayrene Helper, MD Taking Active   amLODipine [facility])  2.5 MG tablet [id number] Yes Take one tablet by mouth every evening at 6 pm for blood pressure Fayrene Helper, MD Taking Active   aspirin EC 325 MG EC tablet [id number] Yes Take 1 tablet (325 mg total) by mouth daily. Brett Canales, PA-C Taking Active Self  B Complex-C (B-COMPLEX WITH VITAMIN C) tablet [id number] Yes Take 1 tablet by mouth daily. [provider] Taking Active Self  Blood Glucose Monitoring Suppl (ONE TOUCH ULTRA 2) w/Device KIT [id number]  One touch ultra meter only dx E11.65 Fayrene Helper, MD  Active   fluticasone [facility]) 50 MCG/ACT nasal spray [id number] Yes Place 1 spray into both nostrils daily. Fayrene Helper, MD Taking Active   gabapentin (NEURONTIN) 300 MG capsule [id number] Yes TAKE ONE CAPSULE BY MOUTH 2 TIMES A DAY AND 2 CAPSULES AT BEDTIME  Patient taking differently: 600 mg 2 (two) times daily. TAKE ONE CAPSULE BY  MOUTH 2 TIMES A DAY AND 2 CAPSULES AT BEDTIME   Fayrene Helper, MD Taking Active   hydrALAZINE (APRESOLINE) 50 MG tablet [id number] Yes TAKE ONE TABLET BY MOUTH 3 TIMES A DAY Fayrene Helper, MD Taking Active   HYDROcodone-acetaminophen [facility]) 7.5-325 MG tablet [id number] Yes TAKE ONE TABLET BY MOUTH TWICE A DAY Perlie Mayo, NP Taking Active   HYDROcodone-acetaminophen (NORCO) 7.5-325 MG tablet [id number]  Take one tablet by mouth two times daily for pain Fayrene Helper, MD  Active   HYDROcodone-acetaminophen [facility]) 7.5-325 MG tablet [id number]  Take one tablet by mouth two times daily for back pain Fayrene Helper, MD  Active   HYDROcodone-acetaminophen [facility]) 7.5-325 MG tablet [id number]  Take one tablet by mouth two times daily for back pain Fayrene Helper, MD  Active   HYDROcodone-acetaminophen [facility]) 7.5-325 MG tablet [id number]  Take one tablet by mouth two times daly for back pain Fayrene Helper, MD  Active   HYDROcodone-acetaminophen [facility]) 7.5-325 MG tablet [id number]  Take one tablet by mouth two times  daily for back pain Fayrene Helper, MD  Active   HYDROcodone-acetaminophen [facility]) 7.5-325 MG tablet [id number]  Take one tablet by mouth twice daily for chronic back pain Fayrene Helper, MD  Active   HYDROcodone-acetaminophen [facility]) 7.5-325 MG tablet [id number]  Take one tablet by mouth two times daily for chronic back pain Fayrene Helper, MD  Active   HYDROcodone-acetaminophen [facility]) 7.5-325 MG tablet [id number]  Take one tablet by mouth two times daily for chronic back pain Fayrene Helper, MD  Active   Lancets (ONETOUCH DELICA PLUS [id number]) [place] [id number]  USE TO TEST THREE TIMES DAILY. Fayrene Helper, MD  Active   losartan (COZAAR) 100 MG tablet [id number] Yes TAKE ONE TABLET BY MOUTH DAILY Fayrene Helper, MD Taking Active   multivitamin-lutein [facility]) CAPS capsule [id number] Yes Take 1 capsule by mouth daily. [provider] Taking Active Self  naloxone [facility]) nasal spray 4 mg/0.1 mL [id number]  One spray into both  nostrils once , as needed Fayrene Helper, MD  Active   NOVOLOG MIX 70/30 FLEXPEN (70-30) 100 UNIT/ML FlexPen [id number] Yes INJECT 35 UNITS UNDER THE SKIN TWICE DAILY WITH A MEAL. Fayrene Helper, MD Taking Active            Med Note Waldo Laine, Garen Grams Oct 07, 2020  1:18 PM) Patient reports only using 20 units qAM and 0 units qPM  ONETOUCH ULTRA test strip [id number]  USE TO TEST THREE TIMES DAILY. Fayrene Helper, MD  Active   pantoprazole (PROTONIX) 40 MG tablet [id number] Yes Take 1 tablet (40 mg total) by mouth daily. Fayrene Helper, MD Taking Active   polyethylene glycol [facility] / GLYCOLAX) packet [id number] Yes Take 17 g by mouth daily as needed for mild constipation. Mix with 8 oz of water. [provider] Taking Active Self  pravastatin (PRAVACHOL) 10 MG tablet [id number] Yes Take 1 tablet (10 mg total) by mouth every morning. Fayrene Helper, MD Taking Active   sodium polystyrene  (KAYEXALATE) 15 GM/60ML suspension [id number] No Take 60 ml by mouth for one dose and repeat one time only 2 days later  Patient not taking: Reported on 10/07/2020   Fayrene Helper, MD Not Taking Active   sodium polystyrene (KAYEXALATE) 15 GM/60ML suspension [id number] No Take 60 cc by mouth once daily for 3 days starting 09/18/2020  Patient not taking: Reported on  10/07/2020   Fayrene Helper, MD Not Taking Active   sodium polystyrene (KAYEXALATE) 15 GM/60ML suspension [id number] No Take 60 cc by mouth one time only and repeat in 2 days, taking an additional 60 cc  Patient not taking: Reported on 10/07/2020   Fayrene Helper, MD Not Taking Active   sodium polystyrene (KAYEXALATE) powder [id number] No Take by mouth ionce for one dose  On 06/02/2020, then repeat the same dose on 06/03/2020  Patient not taking: Reported on 10/07/2020   Fayrene Helper, MD Not Taking Active   spironolactone (ALDACTONE) 50 MG tablet [id number] Yes Take 1 tablet (50 mg total) by mouth daily. Fayrene Helper, MD Taking Active   SURE COMFORT PEN NEEDLES 31G X 8 MM [place] [id number]  USE AS DIRECTED. Fayrene Helper, MD  Active   tamsulosin [facility]) 0.4 MG CAPS capsule [id number] Yes Take 1 capsule (0.4 mg total) by mouth daily. Fayrene Helper, MD Taking Active   temazepam (RESTORIL) 15 MG capsule [id number] Yes TAKE (1) CAPSULE BY MOUTH AT BEDTIME. 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/26/2013   Renal artery stenosis ([place])  01/31/2013   PAD (peripheral artery disease) ([place]) 01/12/2013   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 ([place]) 12/16/2012   B12 deficiency anemia 08/22/2012   Lung nodule seen on imaging study 11/10/2011   COPD (chronic obstructive pulmonary disease) ([place]) 07/07/2010   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   Type 2 diabetes mellitus with vascular disease ([place]) 06/20/2007   Hyperlipidemia LDL goal <100 06/20/2007   GENERALIZED ANXIETY DISORDER 06/20/2007   NICOTINE ADDICTION 06/20/2007   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   LOW BACK PAIN, CHRONIC 06/20/2007    Immunization History  Administered Date(s) Administered   Fluad Quad(high Dose 65+) 11/01/2018, 11/07/2019   Influenza Split 11/08/2010, 11/11/2011   Influenza Whole 11/17/2006   Influenza,inj,Quad PF,6+ Mos 10/29/2012, 11/28/2013, 12/16/2014, 10/14/2015, 11/17/2016, 10/30/2017   Pneumococcal Conjugate-13 01/22/2014   Pneumococcal Polysaccharide-23 08/29/2003, 10/07/2010   Td 08/31/2002   Tdap 12/16/2014   Zoster, Live 10/07/2010    Conditions to be addressed/monitored: HTN, HLD, COPD, DMII, and CKD Stage 3b  Care Plan : Medication Management  Updates made by Beryle Lathe, [place] since 10/07/2020 12:00 AM     Problem: Diabetes, Hypertension, CAD/PAD/HLD, COPD, CKD3b, Nicotine dependence   Priority: High  Onset Date: 10/07/2020     Long-Range Goal: Disease Progression Prevention   Start Date: 10/07/2020  Expected End Date: 01/05/2021  This Visit's Progress: On track  Priority: High  Note:   Current Barriers:  Unable to achieve control of hypertension   Pharmacist Clinical Goal(s):  Over the next 90 days, patient will Achieve control of hypertension  as evidenced by improved blood pressure control through collaboration with PharmD and provider.   Interventions: 1:1 collaboration with Fayrene Helper, MD regarding development and update of comprehensive plan of care as evidenced  by provider attestation and co-signature Inter-disciplinary care team collaboration (see longitudinal plan of care) Comprehensive  medication review performed; medication list updated in electronic medical record  Type 2 Diabetes: Current medications: Novolog Mix 70/30 20 units subcutaneously once daily in the morning Intolerances: unknown Taking medications as directed:  unclear Side effects thought to be attributed to current medication regimen: no Denies hypoglycemic/hyperglycemic symptoms Hypoglycemia prevention: none but will recommend Current meal patterns: breakfast: eggs, oatmeal, and cereal (alternating); lunch: sandwich and chips ; dinner:  frozen dinners ; snacks:  peanut butter crackers or oatmeal cream pie ; drinks: water, coffee with Sweet-N-Low sugar, and diet soda Current exercise:  housework and yardwork On a statin: $RemoveB'[x]'QktxrkLr$  Yes  $Re'[]'DxQ$  No    Last microalbumin: 28.4 (08/06/19); on an ACEi/ARB: $RemoveBefo'[x]'IrFdALIbDdN$  Yes  $Re'[]'bvx$  No    Last eye exam: March 2022 per patient Last foot exam: unknown Pneumonia vaccine: completed Current glucose readings: fasting blood glucose:  130s , pre-prandial blood glucose:  high 200s , post prandial and bedtime glucose:  high 200s Controlled; Most recent A1c at goal of <7% per ADA guidelines Introduction to diabetes basic facts Medication: Identify diabetes medication and when best taken Monitoring: target blood sugar range, when to test Signs and symptoms of hyperglycemia Hypoglycemia: I have discussed with the patient how to treat hypoglycemia by the rule of 15; eat/drink 15g of sugar in the form of glucose tabs, 4 ounces of juice or soda and recheck fingerstick glucose in 15 minutes. Chocolate bars and ice cream should be avoided because fat delays carbohydrate digestion and absorption. Retreat if glucose remains low. Driving should cease until glucose is normal. Need for daily foot inspection and annual eye exam Instructed to monitor blood sugars three times a day at  the following times: fasting (at least 8 hours since last food consumption), 5-15 minutes before dinner, bedtime, and whenever patient experiences symptoms of hypo/hyperglycemia Consider slight increase in insulin given frequent hyperglycemia without recent hypoglycemic episode . Patient reports only taking 20 units of Novolog 70/30 subcutaneously before breakfast but appears to have blood glucose in the 200s all days. Fasting blood glucose nearly within normal limits. Consider glucagon for the treatment of severe hypoglycemia  Hypertension: Current medications: losartan 100 mg by mouth once daily, amlodipine 12.5 mg by mouth at bedtime, spironolactone 50 mg by mouth once daily, and hydralazine 50 mg by mouth every 8 hours Intolerances: unknown Taking medications as directed: yes Side effects thought to be attributed to current medication regimen: no Denies dizziness, lightheadedness, blurred vision, and headache Home blood pressure readings: has a wrist cuff and only checks 2-3x per month but unsure of readings Blood pressure under poor control. Blood pressure is above goal of <130/80 mmHg per 2017 AHA/ACC guidelines. Patient advised to get an arm blood pressure cuff and check daily Continue losartan 100 mg by mouth once daily, amlodipine 12.5 mg by mouth at bedtime, spironolactone 50 mg by mouth once daily, and hydralazine 50 mg by mouth every 8 hours Encourage dietary sodium restriction/DASH diet Recommend home blood pressure monitoring, to bring results in next visit Discussed need for medication compliance Reviewed risks of hypertension, principles of treatment and consequences of untreated hypertension Patient counseled on smoking cessation Consider further titration of hydralazine to 75 or 100 mg by mouth three time daily  Could also consider adding thiazide diuretic such as chlorthalidone 12.5 mg by mouth daily which may also help decrease potassium levels (See CLICK Trial for thiazide  evidence of blood pressure reduction in chronic kidney disease). Would recommend to monitor BMP closely after initiation.   Hyperlipidemia: Current  medications: pravastatin 10 mg by mouth once daily Also taking aspirin 325 mg by mouth daily which was recommended by a heart doctor in [place] per patient Intolerances: none Taking medications as directed: yes Side effects thought to be attributed to current medication regimen: no Controlled; LDL at goal of <70 due to very high risk given diabetes + at least 1 additional major risk factor (advancing age, hypertension, chronic kidney disease (CKD), and cigarette smoking) per 2020 AACE/ACE guidelines and TG at goal of <150 per 2020 AACE/ACE guidelines Continue pravastatin 10 mg by mouth once daily Reviewed risks of hyperlipidemia, principles of treatment and consequences of untreated hyperlipidemia Discussed need for medication compliance Patient counseled on smoking cessation  Chronic Obstructive Pulmonary Disease: Controlled Current treatment: fluticasone/salmeterol (Advair Diskus, AirDuo RespiClick, AirDuo Digihaler, Wixela Inhub) 100-50 mcg 1 puff by mouth every 12 hours Most recent Pulmonary Function Testing: unknown 0 exacerbations requiring treatment in the last 6 months  Current oxygen requirements: none Continue fluticasone/salmeterol (Advair Diskus) 100-50 mcg 1 puff by mouth every 12 hours Consider addition of LAMA such as tiotropium (Spiriva Respimat) 2 inhalations once daily Recommend checking pulmonary function test Patient counseled on smoking cessation  Tobacco Abuse (Current Smoker): Patient endorses that he currently smokes ~20 cigarettes per day Patient has smoked for 70+ years Patient does smoke within 30 minutes of waking up Previous quit attempts: successful although patient unsure what he used Current medication management: none Barriers: under a lot of stress now Motivation to quit smoking: On a scale of 1-10,  how IMPORTANT is it for you to quit smoking: 10 On a scale of 1-10, how CONFIDENT are you that you can quit smoking: 10 Advised patient to quit smoking and offered support but patient reports he is not ready to quit at this time  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): Current medications: losartan 100 mg by mouth once daily, spironolactone 50 mg by mouth once daily, and pravastatin 10 mg by mouth once daily Intolerances: none Taking medications as directed: yes Side effects thought to be attributed to current medication regimen: no Denies dizziness, lightheadedness, blurred vision, and headache Most recent GFR: 38 mL/min Most recent microalbumin: 28.4 Continue losartan 100 mg by mouth once daily, spironolactone 50 mg by mouth once daily, and pravastatin 10 mg by mouth once daily Patient counseled on smoking cessation Discussed need for medication compliance Discussed need for improved control of blood pressure Discussed need for improved control of blood sugar  Patient Goals/Self-Care Activities Over the next 90  days, patient will:  Focus on medication adherence by keeping up with prescription refills and either using a pill box or reminders to take your medications at the prescribed times Check blood sugar three times a day at the following times: fasting (at least 8 hours since last food consumption), 5-15 minutes before dinner, and bedtime, document, and provide at future appointments Check blood pressure at least once daily, document, and provide at future appointments Engage in dietary modifications by fewer sweetened foods & beverages and decreased sodium intake  Follow Up Plan: The patient has been provided with contact information for the care management team and has been advised to call with any health related questions or concerns.  Next PCP appointment scheduled for: 12/10/20      Medication Assistance: None required.  Patient  affirms current coverage meets needs.  Patient's preferred pharmacy is:  [place], [address]  [place] [address] Phone: [phone] Fax: [phone]  Follow Up:  Patient agrees to Care Plan and Follow-up.  Plan: The patient has been provided with contact information for the care management team and has been advised to call with any health related questions or concerns.  and Next PCP appointment scheduled for: 12/10/20  Kennon Holter, PharmD Clinical Pharmacist [facility] [phone]

## 2020-10-08 ENCOUNTER — Other Ambulatory Visit: Payer: Self-pay | Admitting: Family Medicine

## 2020-10-12 ENCOUNTER — Other Ambulatory Visit: Payer: Self-pay | Admitting: Family Medicine

## 2020-10-15 ENCOUNTER — Ambulatory Visit: Payer: PPO | Admitting: Pharmacist

## 2020-10-15 DIAGNOSIS — N1832 Chronic kidney disease, stage 3b: Secondary | ICD-10-CM

## 2020-10-15 DIAGNOSIS — E1159 Type 2 diabetes mellitus with other circulatory complications: Secondary | ICD-10-CM

## 2020-10-15 DIAGNOSIS — J449 Chronic obstructive pulmonary disease, unspecified: Secondary | ICD-10-CM

## 2020-10-15 DIAGNOSIS — F17200 Nicotine dependence, unspecified, uncomplicated: Secondary | ICD-10-CM

## 2020-10-15 DIAGNOSIS — I150 Renovascular hypertension: Secondary | ICD-10-CM

## 2020-10-15 DIAGNOSIS — E785 Hyperlipidemia, unspecified: Secondary | ICD-10-CM

## 2020-10-15 NOTE — Patient Instructions (Signed)
Donald Everett,  You have been un-enrolled in chronic care management services at your request. Please feel free to each out in the future if you want to re-enroll.  Visit Information  PATIENT GOALS:  Goals Addressed             This Visit's Progress    COMPLETED: Medication Magement       Patient Goals/Self-Care Activities Over the next 90  days, patient will:  Focus on medication adherence by keeping up with prescription refills and either using a pill box or reminders to take your medications at the prescribed times Check blood sugar three times a day at the following times: fasting (at least 8 hours since last food consumption), 5-15 minutes before dinner, and bedtime, document, and provide at future appointments Check blood pressure at least once daily, document, and provide at future appointments Engage in dietary modifications by fewer sweetened foods & beverages and decreased sodium intake        The patient verbalized understanding of instructions, educational materials, and care plan provided today and declined offer to receive copy of patient instructions, educational materials, and care plan.   Follow-up with PCP  Domenic Moras, PharmD Clinical Pharmacist [facility] [phone]

## 2020-10-15 NOTE — Chronic Care Management (AMB) (Signed)
Chronic Care Management Pharmacy Note  10/15/2020 Name:  Donald Everett MRN:  [id number] DOB:  24-Dec-1940  Summary: patient would like to un-enroll from chronic care management services. Care Plan and Patient goal have been marked as complete. The Care Coordination team is available should the patient wish to re-enroll in chronic care management in the future.   Subjective: Donald Everett is an 80 y.o. year old male who is a primary patient of Moshe Cipro, Norwood Levo, MD. The CCM team was consulted for assistance with disease management and care coordination needs.    Patient Care Team: Fayrene Helper, MD as PCP - General Laural Golden Mechele Dawley, MD as Consulting Physician (Gastroenterology) Bettina Gavia, OD (Optometry)  Objective:  Lab Results  Component Value Date   CREATININE 1.73 (H) 10/01/2020   CREATININE 1.68 (H) 09/15/2020   CREATININE 1.68 (H) 09/07/2020    Lab Results  Component Value Date   HGBA1C 6.9 (H) 09/07/2020   Last diabetic Eye exam:  Lab Results  Component Value Date/Time   HMDIABEYEEXA No Retinopathy 04/23/2020 12:00 AM    Last diabetic Foot exam: No results found for: HMDIABFOOTEX      Component Value Date/Time   CHOL 108 03/16/2020 0834   TRIG 79 03/16/2020 0834   HDL 40 03/16/2020 0834   CHOLHDL 2.7 03/16/2020 0834   CHOLHDL 3.1 08/06/2019 0858   VLDL 22 05/16/2016 0829   LDLCALC 52 03/16/2020 0834   LDLCALC 57 08/06/2019 0858    Hepatic Function Latest Ref Rng & Units 09/07/2020 03/16/2020 08/06/2019  Total Protein 6.0 - 8.5 g/dL 6.3 6.0 5.8(L)  Albumin 3.7 - 4.7 g/dL 3.8 3.2(L) -  AST 0 - 40 IU/L $Remov'24 23 17  'WXaGZG$ ALT 0 - 44 IU/L $Remov'15 12 11  'BEdBoM$ Alk Phosphatase 44 - 121 IU/L 86 86 -  Total Bilirubin 0.0 - 1.2 mg/dL 0.5 0.4 0.4  Bilirubin, Direct <=0.2 mg/dL - - -    Lab Results  Component Value Date/Time   TSH 5.850 (H) 03/16/2020 08:34 AM   TSH 5.850 (H) 03/16/2020 08:34 AM   FREET4 1.21 03/16/2020 08:34 AM   FREET4 0.9 12/25/2017 08:43 AM     CBC Latest Ref Rng & Units 08/06/2019 04/19/2018 03/23/2017  WBC 3.8 - 10.8 Thousand/uL 10.5 9.6 10.1  Hemoglobin 13.2 - 17.1 g/dL 11.7(L) 12.5(L) 11.4(L)  Hematocrit 38.5 - 50.0 % 35.5(L) 37.0(L) 36.0(L)  Platelets 140 - 400 Thousand/uL 202 228 248    Lab Results  Component Value Date/Time   VD25OH 53.8 03/16/2020 08:34 AM   VD25OH 19 (L) 11/30/2018 08:51 AM    Clinical ASCVD: No  The ASCVD Risk score Mikey Bussing DC Jr., et al., 2013) failed to calculate for the following reasons:   The 2013 ASCVD risk score is only valid for ages 50 to 86    Social History   Tobacco Use  Smoking Status Every Day   Packs/day: 1.00   Years: 60.00   Pack years: 60.00   Types: Cigarettes  Smokeless Tobacco Former   Types: Chew   Quit date: 10/20/1983  Tobacco Comments   18   BP Readings from Last 3 Encounters:  09/07/20 (!) 154/61  06/01/20 (!) 203/77  03/16/20 (!) 183/70   Pulse Readings from Last 3 Encounters:  09/07/20 70  06/01/20 81  12/02/19 78   Wt Readings from Last 3 Encounters:  09/07/20 144 lb (65.3 kg)  06/01/20 141 lb (64 kg)  12/02/19 146 lb (66.2 kg)  Assessment: Review of patient past medical history, allergies, medications, health status, including review of consultants reports, laboratory and other test data, was performed as part of comprehensive evaluation and provision of chronic care management services.   SDOH:  (Social Determinants of Health) assessments and interventions performed:    CCM Care Plan  No Known Allergies  Medications Reviewed Today     Reviewed by Beryle Lathe, [facility] (Pharmacist) on 10/07/20 at 1322  Med List Status: <None>   Medication Order Taking? Sig Documenting Provider Last Dose Status Informant  ADVAIR DISKUS 100-50 MCG/ACT AEPB [id number] Yes INHALE 1 INHALATION EVERY 12 HOURS. Fayrene Helper, MD Taking Active   amLODipine (NORVASC) 10 MG tablet [id number] Yes Take 1 tablet (10 mg total) by mouth daily. Fayrene Helper, MD Taking Active   amLODipine [facility]) 2.5 MG tablet [id number] Yes Take one tablet by mouth every evening at 6 pm for blood pressure Fayrene Helper, MD Taking Active   aspirin EC 325 MG EC tablet [id number] Yes Take 1 tablet (325 mg total) by mouth daily. Brett Canales, PA-C Taking Active Self  B Complex-C (B-COMPLEX WITH VITAMIN C) tablet [id number] Yes Take 1 tablet by mouth daily. [provider] Taking Active Self  Blood Glucose Monitoring Suppl (ONE TOUCH ULTRA 2) w/Device KIT [id number]  One touch ultra meter only dx E11.65 Fayrene Helper, MD  Active   fluticasone [facility]) 50 MCG/ACT nasal spray [id number] Yes Place 1 spray into both nostrils daily. Fayrene Helper, MD Taking Active   gabapentin (NEURONTIN) 300 MG capsule [id number] Yes TAKE ONE CAPSULE BY MOUTH 2 TIMES A DAY AND 2 CAPSULES AT BEDTIME  Patient taking differently: 600 mg 2 (two) times daily. TAKE ONE CAPSULE BY MOUTH 2 TIMES A DAY AND 2 CAPSULES AT BEDTIME   Fayrene Helper, MD Taking Active   hydrALAZINE (APRESOLINE) 50 MG tablet [id number] Yes TAKE ONE TABLET BY MOUTH 3 TIMES A DAY Fayrene Helper, MD Taking Active   HYDROcodone-acetaminophen [facility]) 7.5-325 MG tablet [id number] Yes TAKE ONE TABLET BY MOUTH TWICE A DAY Perlie Mayo, NP Taking Active   HYDROcodone-acetaminophen (NORCO) 7.5-325 MG tablet [id number]  Take one tablet by mouth two times daily for pain Fayrene Helper, MD  Active   HYDROcodone-acetaminophen [facility]) 7.5-325 MG tablet [id number]  Take one tablet by mouth two times daily for back pain Fayrene Helper, MD  Active   HYDROcodone-acetaminophen [facility]) 7.5-325 MG tablet [id number]  Take one tablet by mouth two times daily for back pain Fayrene Helper, MD  Active   HYDROcodone-acetaminophen [facility]) 7.5-325 MG tablet [id number]  Take one tablet by mouth two times daly for back pain Fayrene Helper, MD  Active   HYDROcodone-acetaminophen [facility]) 7.5-325 MG tablet  [id number]  Take one tablet by mouth two times daily for back pain Fayrene Helper, MD  Active   HYDROcodone-acetaminophen [facility]) 7.5-325 MG tablet [id number]  Take one tablet by mouth twice daily for chronic back pain Fayrene Helper, MD  Active   HYDROcodone-acetaminophen [facility]) 7.5-325 MG tablet [id number]  Take one tablet by mouth two times daily for chronic back pain Fayrene Helper, MD  Active   HYDROcodone-acetaminophen [facility]) 7.5-325 MG tablet [id number]  Take one tablet by mouth two times daily for chronic back pain Fayrene Helper, MD  Active   Lancets (ONETOUCH DELICA PLUS [id number]) [place] [id number]  USE TO TEST THREE TIMES DAILY. Fayrene Helper, MD  Active   losartan (  COZAAR) 100 MG tablet [id number] Yes TAKE ONE TABLET BY MOUTH DAILY Fayrene Helper, MD Taking Active   multivitamin-lutein [facility]) CAPS capsule [id number] Yes Take 1 capsule by mouth daily. [provider] Taking Active Self  naloxone [facility]) nasal spray 4 mg/0.1 mL [id number]  One spray into both  nostrils once , as needed Fayrene Helper, MD  Active   NOVOLOG MIX 70/30 FLEXPEN (70-30) 100 UNIT/ML FlexPen [id number] Yes INJECT 35 UNITS UNDER THE SKIN TWICE DAILY WITH A MEAL. Fayrene Helper, MD Taking Active            Med Note Waldo Laine, Garen Grams Oct 07, 2020  1:18 PM) Patient reports only using 20 units qAM and 0 units qPM  ONETOUCH ULTRA test strip [id number]  USE TO TEST THREE TIMES DAILY. Fayrene Helper, MD  Active   pantoprazole (PROTONIX) 40 MG tablet [id number] Yes Take 1 tablet (40 mg total) by mouth daily. Fayrene Helper, MD Taking Active   polyethylene glycol [facility] / GLYCOLAX) packet [id number] Yes Take 17 g by mouth daily as needed for mild constipation. Mix with 8 oz of water. [provider] Taking Active Self  pravastatin (PRAVACHOL) 10 MG tablet [id number] Yes Take 1 tablet (10 mg total) by mouth every morning. Fayrene Helper, MD  Taking Active   sodium polystyrene (KAYEXALATE) 15 GM/60ML suspension [id number] No Take 60 ml by mouth for one dose and repeat one time only 2 days later  Patient not taking: Reported on 10/07/2020   Fayrene Helper, MD Not Taking Active   sodium polystyrene (KAYEXALATE) 15 GM/60ML suspension [id number] No Take 60 cc by mouth once daily for 3 days starting 09/18/2020  Patient not taking: Reported on 10/07/2020   Fayrene Helper, MD Not Taking Active   sodium polystyrene (KAYEXALATE) 15 GM/60ML suspension [id number] No Take 60 cc by mouth one time only and repeat in 2 days, taking an additional 60 cc  Patient not taking: Reported on 10/07/2020   Fayrene Helper, MD Not Taking Active   sodium polystyrene (KAYEXALATE) powder [id number] No Take by mouth ionce for one dose  On 06/02/2020, then repeat the same dose on 06/03/2020  Patient not taking: Reported on 10/07/2020   Fayrene Helper, MD Not Taking Active   spironolactone (ALDACTONE) 50 MG tablet [id number] Yes Take 1 tablet (50 mg total) by mouth daily. Fayrene Helper, MD Taking Active   SURE COMFORT PEN NEEDLES 31G X 8 MM [place] [id number]  USE AS DIRECTED. Fayrene Helper, MD  Active   tamsulosin [facility]) 0.4 MG CAPS capsule [id number] Yes Take 1 capsule (0.4 mg total) by mouth daily. Fayrene Helper, MD Taking Active   temazepam (RESTORIL) 15 MG capsule [id number] Yes TAKE (1) CAPSULE BY MOUTH AT BEDTIME. Fayrene Helper, MD Taking Active   Med List Note Fayrene Helper, MD 06/01/20 1346): 99            Patient Active Problem List   Diagnosis Date Noted   Encounter for chronic pain management 11/03/2018   CAD (coronary atherosclerotic disease) 02/03/2017   Hyperkalemia 07/25/2016   CKD (chronic kidney disease) stage 3, GFR 30-59 ml/min ([place]) 01/23/2015   GERD (gastroesophageal reflux disease) 03/18/2013   Insomnia secondary to anxiety 03/18/2013   Mobitz type 1 second degree atrioventricular block  02/26/2013   Renal artery stenosis (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   Type 2 diabetes mellitus with vascular disease ([place]) 06/20/2007   Hyperlipidemia LDL goal <100 06/20/2007   GENERALIZED ANXIETY DISORDER 06/20/2007   NICOTINE ADDICTION 06/20/2007   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   LOW BACK PAIN, CHRONIC 06/20/2007    Immunization History  Administered Date(s) Administered   Fluad Quad(high Dose 65+) 11/01/2018, 11/07/2019   Influenza Split 11/08/2010, 11/11/2011   Influenza Whole 11/17/2006   Influenza,inj,Quad PF,6+ Mos 10/29/2012, 11/28/2013, 12/16/2014, 10/14/2015, 11/17/2016, 10/30/2017   Pneumococcal Conjugate-13 01/22/2014   Pneumococcal Polysaccharide-23 08/29/2003, 10/07/2010   Td 08/31/2002   Tdap 12/16/2014   Zoster, Live 10/07/2010    Conditions to be addressed/monitored: CAD, HTN, HLD, DMII, and CKD Stage 3b  Care Plan : Medication Management  Updates made by Beryle Lathe, [place] since 10/15/2020 12:00 AM  Completed 10/15/2020   Problem: Diabetes, Hypertension, CAD/PAD/HLD, COPD, CKD3b, Nicotine dependence Resolved 10/15/2020  Priority: High  Onset Date: 10/07/2020     Long-Range Goal: Disease Progression Prevention Completed 10/15/2020  Start Date: 10/07/2020  Expected End Date: 01/05/2021  Recent Progress: On track  Priority: High  Note:   Current Barriers:  Unable to achieve control of hypertension   Pharmacist Clinical Goal(s):  Over the next 90 days, patient will Achieve control of hypertension  as evidenced by improved blood pressure control through collaboration with PharmD and provider.   Interventions: 1:1 collaboration with Fayrene Helper, MD regarding development and update of comprehensive plan of care as evidenced by provider attestation and co-signature Inter-disciplinary care team collaboration (see longitudinal plan of care) Comprehensive medication review performed; medication list updated in electronic medical record  Type 2 Diabetes: Current medications: Novolog Mix 70/30 20 units subcutaneously once daily in the morning Intolerances: unknown Taking medications as directed:  unclear Side effects thought to be attributed to current medication regimen: no Denies hypoglycemic/hyperglycemic symptoms Hypoglycemia prevention: none but will recommend Current meal patterns: breakfast: eggs, oatmeal, and cereal (alternating); lunch: sandwich and chips ; dinner:  frozen dinners ; snacks:  peanut butter crackers or oatmeal cream pie ; drinks: water, coffee with Sweet-N-Low sugar, and diet soda Current exercise:  housework and yardwork On a statin: $RemoveB'[x]'dIvvNNrp$  Yes  $Re'[]'LcN$  No    Last microalbumin: 28.4 (08/06/19); on an ACEi/ARB: $RemoveBefo'[x]'lpKgVhIJHhy$  Yes  $Re'[]'mcy$  No    Last eye exam: March 2022 per patient Last foot exam: unknown Pneumonia vaccine: completed Current glucose readings: fasting blood glucose:  130s , pre-prandial blood glucose:  high 200s , post prandial and bedtime glucose:  high 200s Controlled; Most recent A1c at goal of <7% per ADA guidelines Introduction to diabetes basic facts Medication: Identify diabetes medication and when best taken Monitoring: target blood sugar range, when to test Signs and symptoms of hyperglycemia Hypoglycemia: I have discussed with the patient how to treat hypoglycemia by the rule of 15; eat/drink 15g of sugar in the form of glucose tabs, 4 ounces of juice or soda and recheck fingerstick glucose in 15 minutes. Chocolate bars and ice cream should be avoided because fat delays carbohydrate digestion and absorption. Retreat if glucose remains low. Driving should cease until glucose is normal. Need for daily  foot inspection and annual eye exam Instructed to monitor blood sugars three times a day at the following times: fasting (at least 8 hours since last food consumption),  5-15 minutes before dinner, bedtime, and whenever patient experiences symptoms of hypo/hyperglycemia Consider slight increase in insulin given frequent hyperglycemia without recent hypoglycemic episode . Patient reports only taking 20 units of Novolog 70/30 subcutaneously before breakfast but appears to have blood glucose in the 200s all days. Fasting blood glucose nearly within normal limits. Consider glucagon for the treatment of severe hypoglycemia  Hypertension: Current medications: losartan 100 mg by mouth once daily, amlodipine 12.5 mg by mouth at bedtime, spironolactone 50 mg by mouth once daily, and hydralazine 50 mg by mouth every 8 hours Intolerances: unknown Taking medications as directed: yes Side effects thought to be attributed to current medication regimen: no Denies dizziness, lightheadedness, blurred vision, and headache Home blood pressure readings: has a wrist cuff and only checks 2-3x per month but unsure of readings Blood pressure under poor control. Blood pressure is above goal of <130/80 mmHg per 2017 AHA/ACC guidelines. Patient advised to get an arm blood pressure cuff and check daily Continue losartan 100 mg by mouth once daily, amlodipine 12.5 mg by mouth at bedtime, spironolactone 50 mg by mouth once daily, and hydralazine 50 mg by mouth every 8 hours Encourage dietary sodium restriction/DASH diet Recommend home blood pressure monitoring, to bring results in next visit Discussed need for medication compliance Reviewed risks of hypertension, principles of treatment and consequences of untreated hypertension Patient counseled on smoking cessation Consider further titration of hydralazine to 75 or 100 mg by mouth three time daily  Could also consider adding thiazide diuretic such as chlorthalidone 12.5 mg  by mouth daily which may also help decrease potassium levels (See CLICK Trial for thiazide evidence of blood pressure reduction in chronic kidney disease). Would recommend to monitor BMP closely after initiation.   Hyperlipidemia: Current medications: pravastatin 10 mg by mouth once daily Also taking aspirin 325 mg by mouth daily which was recommended by a heart doctor in [place] per patient Intolerances: none Taking medications as directed: yes Side effects thought to be attributed to current medication regimen: no Controlled; LDL at goal of <70 due to very high risk given diabetes + at least 1 additional major risk factor (advancing age, hypertension, chronic kidney disease (CKD), and cigarette smoking) per 2020 AACE/ACE guidelines and TG at goal of <150 per 2020 AACE/ACE guidelines Continue pravastatin 10 mg by mouth once daily Reviewed risks of hyperlipidemia, principles of treatment and consequences of untreated hyperlipidemia Discussed need for medication compliance Patient counseled on smoking cessation  Chronic Obstructive Pulmonary Disease: Controlled Current treatment: fluticasone/salmeterol (Advair Diskus, AirDuo RespiClick, AirDuo Digihaler, Wixela Inhub) 100-50 mcg 1 puff by mouth every 12 hours Most recent Pulmonary Function Testing: unknown 0 exacerbations requiring treatment in the last 6 months  Current oxygen requirements: none Continue fluticasone/salmeterol (Advair Diskus) 100-50 mcg 1 puff by mouth every 12 hours Consider addition of LAMA such as tiotropium (Spiriva Respimat) 2 inhalations once daily Recommend checking pulmonary function test Patient counseled on smoking cessation  Tobacco Abuse (Current Smoker): Patient endorses that he currently smokes ~20 cigarettes per day Patient has smoked for 70+ years Patient does smoke within 30 minutes of waking up Previous quit attempts: successful although patient unsure what he used Current medication management:  none Barriers: under a lot of stress now Motivation to quit smoking: On a scale of 1-10, how IMPORTANT is it for you to quit smoking: 10 On a scale of 1-10, how CONFIDENT are you that you can quit smoking: 10 Advised patient to quit smoking and offered support but patient  reports he is not ready to quit at this time  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): Current medications: losartan 100 mg by mouth once daily, spironolactone 50 mg by mouth once daily, and pravastatin 10 mg by mouth once daily Intolerances: none Taking medications as directed: yes Side effects thought to be attributed to current medication regimen: no Denies dizziness, lightheadedness, blurred vision, and headache Most recent GFR: 38 mL/min Most recent microalbumin: 28.4 Continue losartan 100 mg by mouth once daily, spironolactone 50 mg by mouth once daily, and pravastatin 10 mg by mouth once daily Patient counseled on smoking cessation Discussed need for medication compliance Discussed need for improved control of blood pressure Discussed need for improved control of blood sugar  Patient Goals/Self-Care Activities Over the next 90  days, patient will:  Focus on medication adherence by keeping up with prescription refills and either using a pill box or reminders to take your medications at the prescribed times Check blood sugar three times a day at the following times: fasting (at least 8 hours since last food consumption), 5-15 minutes before dinner, and bedtime, document, and provide at future appointments Check blood pressure at least once daily, document, and provide at future appointments Engage in dietary modifications by fewer sweetened foods & beverages and decreased sodium intake  Follow Up Plan: The patient has been provided with contact information for the care management team and has been advised to call with any health related questions or concerns.  Next PCP  appointment scheduled for: 12/10/20      Medication Assistance: None required.  Patient affirms current coverage meets needs.  Patient's preferred pharmacy is:  [facility], [address] [place] [address] Phone: [phone] Fax: [phone]  Follow Up:  Patient requests no follow-up at this time.  Plan:  follow-up with PCP  Kennon Holter, PharmD Clinical Pharmacist [facility] [phone]

## 2020-10-29 ENCOUNTER — Other Ambulatory Visit: Payer: Self-pay | Admitting: Family Medicine

## 2020-11-02 ENCOUNTER — Other Ambulatory Visit: Payer: Self-pay

## 2020-11-02 ENCOUNTER — Ambulatory Visit (INDEPENDENT_AMBULATORY_CARE_PROVIDER_SITE_OTHER): Payer: PPO

## 2020-11-02 DIAGNOSIS — E875 Hyperkalemia: Secondary | ICD-10-CM | POA: Diagnosis not present

## 2020-11-02 DIAGNOSIS — D519 Vitamin B12 deficiency anemia, unspecified: Secondary | ICD-10-CM

## 2020-11-02 MED ORDER — CYANOCOBALAMIN 1000 MCG/ML IJ SOLN
1000.0000 ug | Freq: Once | INTRAMUSCULAR | Status: AC
  Administered 2020-11-02: 1000 ug via INTRAMUSCULAR

## 2020-11-02 MED ORDER — CYANOCOBALAMIN 1000 MCG/ML IJ SOLN: 1000.0000 ug | Freq: Once | INTRAMUSCULAR | 0 refills | Status: DC

## 2020-11-03 ENCOUNTER — Other Ambulatory Visit: Payer: Self-pay

## 2020-11-03 ENCOUNTER — Other Ambulatory Visit: Payer: Self-pay | Admitting: Family Medicine

## 2020-11-03 DIAGNOSIS — N1832 Chronic kidney disease, stage 3b: Secondary | ICD-10-CM

## 2020-11-03 DIAGNOSIS — E875 Hyperkalemia: Secondary | ICD-10-CM

## 2020-11-03 LAB — CMP14+EGFR
ALT: 13 IU/L (ref 0–44)
AST: 21 IU/L (ref 0–40)
Albumin/Globulin Ratio: 1.3 (ref 1.2–2.2)
Albumin: 3.5 g/dL — ABNORMAL LOW (ref 3.7–4.7)
Alkaline Phosphatase: 104 IU/L (ref 44–121)
BUN/Creatinine Ratio: 21 (ref 10–24)
BUN: 42 mg/dL — ABNORMAL HIGH (ref 8–27)
Bilirubin Total: 0.3 mg/dL (ref 0.0–1.2)
CO2: 17 mmol/L — ABNORMAL LOW (ref 20–29)
Calcium: 8.9 mg/dL (ref 8.6–10.2)
Chloride: 104 mmol/L (ref 96–106)
Creatinine, Ser: 1.98 mg/dL — ABNORMAL HIGH (ref 0.76–1.27)
Globulin, Total: 2.6 g/dL (ref 1.5–4.5)
Glucose: 179 mg/dL — ABNORMAL HIGH (ref 65–99)
Potassium: 6.4 mmol/L — ABNORMAL HIGH (ref 3.5–5.2)
Sodium: 130 mmol/L — ABNORMAL LOW (ref 134–144)
Total Protein: 6.1 g/dL (ref 6.0–8.5)
eGFR: 34 mL/min/{1.73_m2} — ABNORMAL LOW (ref >59)

## 2020-11-03 MED ORDER — SODIUM POLYSTYRENE SULFONATE 15 GM/60ML PO SUSP: ORAL | 0 refills | Status: AC

## 2020-11-09 ENCOUNTER — Other Ambulatory Visit: Payer: Self-pay | Admitting: Family Medicine

## 2020-11-09 DIAGNOSIS — N1832 Chronic kidney disease, stage 3b: Secondary | ICD-10-CM | POA: Diagnosis not present

## 2020-11-09 DIAGNOSIS — E875 Hyperkalemia: Secondary | ICD-10-CM | POA: Diagnosis not present

## 2020-11-10 LAB — CMP14+EGFR
ALT: 12 IU/L (ref 0–44)
AST: 20 IU/L (ref 0–40)
Albumin/Globulin Ratio: 1.4 (ref 1.2–2.2)
Albumin: 3.3 g/dL — ABNORMAL LOW (ref 3.7–4.7)
Alkaline Phosphatase: 98 IU/L (ref 44–121)
BUN/Creatinine Ratio: 19 (ref 10–24)
BUN: 33 mg/dL — ABNORMAL HIGH (ref 8–27)
Bilirubin Total: 0.3 mg/dL (ref 0.0–1.2)
CO2: 18 mmol/L — ABNORMAL LOW (ref 20–29)
Calcium: 8.2 mg/dL — ABNORMAL LOW (ref 8.6–10.2)
Chloride: 102 mmol/L (ref 96–106)
Creatinine, Ser: 1.76 mg/dL — ABNORMAL HIGH (ref 0.76–1.27)
Globulin, Total: 2.4 g/dL (ref 1.5–4.5)
Glucose: 150 mg/dL — ABNORMAL HIGH (ref 70–99)
Potassium: 4.8 mmol/L (ref 3.5–5.2)
Sodium: 133 mmol/L — ABNORMAL LOW (ref 134–144)
Total Protein: 5.7 g/dL — ABNORMAL LOW (ref 6.0–8.5)
eGFR: 39 mL/min/{1.73_m2} — ABNORMAL LOW (ref >59)

## 2020-12-02 ENCOUNTER — Encounter (INDEPENDENT_AMBULATORY_CARE_PROVIDER_SITE_OTHER): Payer: Self-pay

## 2020-12-02 ENCOUNTER — Other Ambulatory Visit: Payer: Self-pay

## 2020-12-02 ENCOUNTER — Ambulatory Visit (INDEPENDENT_AMBULATORY_CARE_PROVIDER_SITE_OTHER): Payer: PPO

## 2020-12-02 DIAGNOSIS — D519 Vitamin B12 deficiency anemia, unspecified: Secondary | ICD-10-CM

## 2020-12-02 MED ORDER — CYANOCOBALAMIN 1000 MCG/ML IJ SOLN
1000.0000 ug | Freq: Once | INTRAMUSCULAR | Status: AC
  Administered 2020-12-02: 1000 ug via INTRAMUSCULAR

## 2020-12-10 ENCOUNTER — Ambulatory Visit: Payer: PPO | Admitting: Family Medicine

## 2020-12-15 DEATH — deceased
